# Patient Record
Sex: Male | Born: 1935 | Race: White | Hispanic: No | Marital: Married | Smoking: Former smoker
Health system: Southern US, Community
[De-identification: ages and names within clinical notes are randomized; demographics above are authoritative.]

## PROBLEM LIST (undated history)

## (undated) DIAGNOSIS — I714 Abdominal aortic aneurysm, without rupture, unspecified: Secondary | ICD-10-CM

## (undated) DIAGNOSIS — E669 Obesity, unspecified: Secondary | ICD-10-CM

## (undated) DIAGNOSIS — J189 Pneumonia, unspecified organism: Secondary | ICD-10-CM

## (undated) DIAGNOSIS — F419 Anxiety disorder, unspecified: Secondary | ICD-10-CM

## (undated) DIAGNOSIS — K5731 Diverticulosis of large intestine without perforation or abscess with bleeding: Principal | ICD-10-CM

## (undated) DIAGNOSIS — E785 Hyperlipidemia, unspecified: Secondary | ICD-10-CM

## (undated) DIAGNOSIS — I10 Essential (primary) hypertension: Secondary | ICD-10-CM

## (undated) DIAGNOSIS — I252 Old myocardial infarction: Secondary | ICD-10-CM

## (undated) DIAGNOSIS — R0902 Hypoxemia: Secondary | ICD-10-CM

## (undated) DIAGNOSIS — I480 Paroxysmal atrial fibrillation: Secondary | ICD-10-CM

## (undated) DIAGNOSIS — Z8701 Personal history of pneumonia (recurrent): Secondary | ICD-10-CM

## (undated) DIAGNOSIS — I2510 Atherosclerotic heart disease of native coronary artery without angina pectoris: Secondary | ICD-10-CM

## (undated) DIAGNOSIS — J302 Other seasonal allergic rhinitis: Secondary | ICD-10-CM

## (undated) DIAGNOSIS — IMO0001 Reserved for inherently not codable concepts without codable children: Secondary | ICD-10-CM

## (undated) DIAGNOSIS — J439 Emphysema, unspecified: Secondary | ICD-10-CM

## (undated) DIAGNOSIS — Z951 Presence of aortocoronary bypass graft: Secondary | ICD-10-CM

## (undated) HISTORY — DX: Personal history of pneumonia (recurrent): Z87.01

## (undated) HISTORY — DX: Hypoxemia: R09.02

## (undated) HISTORY — PX: OTHER SURGICAL HISTORY: SHX169

## (undated) HISTORY — DX: Emphysema, unspecified: J43.9

## (undated) HISTORY — DX: Presence of aortocoronary bypass graft: Z95.1

## (undated) HISTORY — DX: Old myocardial infarction: I25.2

## (undated) HISTORY — PX: CORONARY ARTERY BYPASS GRAFT: SHX141

## (undated) HISTORY — DX: Obesity, unspecified: E66.9

## (undated) HISTORY — DX: Atherosclerotic heart disease of native coronary artery without angina pectoris: I25.10

## (undated) HISTORY — DX: Hyperlipidemia, unspecified: E78.5

## (undated) HISTORY — DX: Pneumonia, unspecified organism: J18.9

## (undated) HISTORY — DX: Other seasonal allergic rhinitis: J30.2

## (undated) HISTORY — PX: CATARACT EXTRACTION: SUR2

---

## 2000-02-01 ENCOUNTER — Ambulatory Visit (HOSPITAL_COMMUNITY): Admission: RE | Admit: 2000-02-01 | Discharge: 2000-02-01 | Payer: Self-pay | Admitting: *Deleted

## 2000-02-07 ENCOUNTER — Observation Stay (HOSPITAL_COMMUNITY): Admission: EM | Admit: 2000-02-07 | Discharge: 2000-02-08 | Payer: Self-pay | Admitting: *Deleted

## 2000-02-07 ENCOUNTER — Encounter (INDEPENDENT_AMBULATORY_CARE_PROVIDER_SITE_OTHER): Payer: Self-pay

## 2001-04-30 ENCOUNTER — Ambulatory Visit (HOSPITAL_COMMUNITY): Admission: RE | Admit: 2001-04-30 | Discharge: 2001-04-30 | Payer: Self-pay | Admitting: Family Medicine

## 2004-11-30 ENCOUNTER — Ambulatory Visit (HOSPITAL_COMMUNITY): Admission: RE | Admit: 2004-11-30 | Discharge: 2004-12-01 | Payer: Self-pay | Admitting: Ophthalmology

## 2005-07-05 ENCOUNTER — Ambulatory Visit: Admission: RE | Admit: 2005-07-05 | Discharge: 2005-07-05 | Payer: Self-pay | Admitting: Internal Medicine

## 2005-07-12 ENCOUNTER — Ambulatory Visit: Payer: Self-pay | Admitting: Cardiology

## 2005-07-12 ENCOUNTER — Inpatient Hospital Stay (HOSPITAL_COMMUNITY): Admission: EM | Admit: 2005-07-12 | Discharge: 2005-07-19 | Payer: Self-pay | Admitting: *Deleted

## 2005-07-13 ENCOUNTER — Encounter: Payer: Self-pay | Admitting: Cardiology

## 2005-07-21 DIAGNOSIS — Z951 Presence of aortocoronary bypass graft: Secondary | ICD-10-CM

## 2005-07-21 DIAGNOSIS — I252 Old myocardial infarction: Secondary | ICD-10-CM

## 2005-07-21 DIAGNOSIS — I2510 Atherosclerotic heart disease of native coronary artery without angina pectoris: Secondary | ICD-10-CM

## 2005-07-21 HISTORY — DX: Presence of aortocoronary bypass graft: Z95.1

## 2005-07-21 HISTORY — DX: Atherosclerotic heart disease of native coronary artery without angina pectoris: I25.10

## 2005-07-21 HISTORY — DX: Old myocardial infarction: I25.2

## 2005-08-17 ENCOUNTER — Inpatient Hospital Stay (HOSPITAL_COMMUNITY): Admission: AD | Admit: 2005-08-17 | Discharge: 2005-09-08 | Payer: Self-pay | Admitting: Cardiology

## 2005-08-17 HISTORY — PX: CARDIAC CATHETERIZATION: SHX172

## 2005-08-18 ENCOUNTER — Ambulatory Visit: Payer: Self-pay | Admitting: Critical Care Medicine

## 2005-08-19 ENCOUNTER — Encounter: Payer: Self-pay | Admitting: Vascular Surgery

## 2005-08-19 ENCOUNTER — Encounter (INDEPENDENT_AMBULATORY_CARE_PROVIDER_SITE_OTHER): Payer: Self-pay | Admitting: Cardiology

## 2005-08-23 ENCOUNTER — Encounter: Payer: Self-pay | Admitting: Vascular Surgery

## 2005-09-30 ENCOUNTER — Encounter: Admission: RE | Admit: 2005-09-30 | Discharge: 2005-09-30 | Payer: Self-pay | Admitting: Cardiothoracic Surgery

## 2005-10-20 ENCOUNTER — Encounter (HOSPITAL_COMMUNITY): Admission: RE | Admit: 2005-10-20 | Discharge: 2006-01-18 | Payer: Self-pay | Admitting: Cardiology

## 2005-11-11 ENCOUNTER — Encounter: Admission: RE | Admit: 2005-11-11 | Discharge: 2005-11-11 | Payer: Self-pay | Admitting: Cardiothoracic Surgery

## 2006-01-19 ENCOUNTER — Encounter (HOSPITAL_COMMUNITY): Admission: RE | Admit: 2006-01-19 | Discharge: 2006-02-17 | Payer: Self-pay | Admitting: Cardiology

## 2006-03-15 ENCOUNTER — Ambulatory Visit: Payer: Self-pay | Admitting: Internal Medicine

## 2006-04-20 ENCOUNTER — Ambulatory Visit: Payer: Self-pay | Admitting: Internal Medicine

## 2006-05-11 ENCOUNTER — Ambulatory Visit: Payer: Self-pay | Admitting: Internal Medicine

## 2006-06-29 ENCOUNTER — Ambulatory Visit: Payer: Self-pay | Admitting: Internal Medicine

## 2006-09-27 ENCOUNTER — Ambulatory Visit: Payer: Self-pay | Admitting: Internal Medicine

## 2007-01-25 ENCOUNTER — Ambulatory Visit: Payer: Self-pay | Admitting: Internal Medicine

## 2007-05-23 DIAGNOSIS — J439 Emphysema, unspecified: Secondary | ICD-10-CM

## 2007-05-23 DIAGNOSIS — I2510 Atherosclerotic heart disease of native coronary artery without angina pectoris: Secondary | ICD-10-CM | POA: Insufficient documentation

## 2007-05-23 HISTORY — DX: Emphysema, unspecified: J43.9

## 2007-05-25 ENCOUNTER — Ambulatory Visit: Payer: Self-pay | Admitting: Internal Medicine

## 2007-08-26 ENCOUNTER — Emergency Department (HOSPITAL_COMMUNITY): Admission: EM | Admit: 2007-08-26 | Discharge: 2007-08-26 | Payer: Self-pay | Admitting: Emergency Medicine

## 2007-09-05 ENCOUNTER — Inpatient Hospital Stay (HOSPITAL_COMMUNITY): Admission: EM | Admit: 2007-09-05 | Discharge: 2007-09-07 | Payer: Self-pay | Admitting: Emergency Medicine

## 2007-11-21 ENCOUNTER — Ambulatory Visit: Payer: Self-pay | Admitting: Internal Medicine

## 2007-12-31 ENCOUNTER — Encounter: Payer: Self-pay | Admitting: Internal Medicine

## 2008-03-18 ENCOUNTER — Telehealth: Payer: Self-pay | Admitting: Internal Medicine

## 2008-05-06 ENCOUNTER — Telehealth (INDEPENDENT_AMBULATORY_CARE_PROVIDER_SITE_OTHER): Payer: Self-pay | Admitting: *Deleted

## 2008-05-26 ENCOUNTER — Ambulatory Visit: Payer: Self-pay | Admitting: Internal Medicine

## 2008-08-19 ENCOUNTER — Encounter: Payer: Self-pay | Admitting: Internal Medicine

## 2008-12-03 ENCOUNTER — Ambulatory Visit: Payer: Self-pay | Admitting: Internal Medicine

## 2009-01-05 ENCOUNTER — Telehealth (INDEPENDENT_AMBULATORY_CARE_PROVIDER_SITE_OTHER): Payer: Self-pay | Admitting: *Deleted

## 2009-03-31 ENCOUNTER — Encounter: Payer: Self-pay | Admitting: Internal Medicine

## 2009-03-31 ENCOUNTER — Telehealth: Payer: Self-pay | Admitting: Internal Medicine

## 2009-04-22 ENCOUNTER — Encounter: Payer: Self-pay | Admitting: Internal Medicine

## 2009-07-13 ENCOUNTER — Ambulatory Visit: Payer: Self-pay | Admitting: Internal Medicine

## 2009-08-06 ENCOUNTER — Encounter: Payer: Self-pay | Admitting: Internal Medicine

## 2009-11-18 ENCOUNTER — Telehealth (INDEPENDENT_AMBULATORY_CARE_PROVIDER_SITE_OTHER): Payer: Self-pay | Admitting: *Deleted

## 2009-11-27 ENCOUNTER — Encounter: Payer: Self-pay | Admitting: Internal Medicine

## 2010-01-11 ENCOUNTER — Ambulatory Visit: Payer: Self-pay | Admitting: Internal Medicine

## 2010-04-19 ENCOUNTER — Telehealth (INDEPENDENT_AMBULATORY_CARE_PROVIDER_SITE_OTHER): Payer: Self-pay | Admitting: *Deleted

## 2010-05-19 ENCOUNTER — Telehealth (INDEPENDENT_AMBULATORY_CARE_PROVIDER_SITE_OTHER): Payer: Self-pay | Admitting: *Deleted

## 2010-06-22 NOTE — Progress Notes (Signed)
Summary: samples  Phone Note Call from Patient Call back at Home Phone [phone]   Caller: Patient Call For: young Summary of Call: Request samples of spiriva. Initial call taken by: Darletta Moll,  April 19, 2010 4:02 PM  Follow-up for Phone Call        Spokw with pt , does not qualify for assisted drug program, left samples of spiriva for him, he will pick up 04/20/10 Renold Genta RCP, LPN  April 19, 2010 4:12 PM  Follow-up by: pt

## 2010-06-22 NOTE — Progress Notes (Signed)
Summary: spiriva  Phone Note Call from Patient Call back at Home Phone [phone]   Caller: Patient Call For: young Reason for Call: Talk to Nurse Summary of Call: want to ask about getting assistance for his Spiriva. Initial call taken by: Eugene Gavia,  November 18, 2009 3:21 PM  Follow-up for Phone Call        spoke with pt and he is requesting assistance forms be filled out again for the spiriva.  this has been filled out and mailed to pt to sign--he will mail these back and we will have cy sign these and get them faxed in.  pt is aware of process. Randell Loop CMA  November 18, 2009 3:35 PM

## 2010-06-22 NOTE — Letter (Signed)
Summary: [facility]  [facility]   Imported By: Sherian Rein 08/19/2009 09:56:15  _____________________________________________________________________  External Attachment:    Type:   Image     Comment:   External Document

## 2010-06-22 NOTE — Medication Information (Signed)
Summary: Assist form for [facility]   Imported By: Sherian Rein 01/04/2010 08:19:35  _____________________________________________________________________  External Attachment:    Type:   Image     Comment:   External Document

## 2010-06-22 NOTE — Assessment & Plan Note (Signed)
Summary: Roberto Reilly 37mos/ea   Primary Provider/Referring Provider:  Pang/ Al Little  CC:  6 month follow up visit-COPD; Gets SOB when walking in heat-otherwise no complaints..  History of Present Illness: 05/26/08- COPD. Finances. Asks H1N1 vax, had seasonal  vax. Sometimes a little cough at bedtime. Denies chest pain, palpitatilon. stable DOE, has to pace himself.Marland Kitchen Spiriva helps.Breathing does not wake him at night. Uses home neb routinely three times a day. Rarely needs rescue inhaler.  12/03/08- COPD, CAD Very hot weather worsens dyspnea. Some chest congestion , cough  occasionally productive of white, not bloody but sometimes light green. Discussed options due to cost of Spiriva. He decided to try taking spiriva every other day instead of trying theophylline at this time.  July 13, 2009- COPD, CAD Easy DOE with minor exertion. He says it is not realistic for him to try to work. Needs help with xopenex cost. Says he was rejected by the pharmacy plan for Spiriva.  Cough productive yellow to clear. One bad cold. Did get flu vax. Feels pretty well now sitting at rest.  January 11, 2010- COPD, CAD  Hot months were difficult- starting to do better now. Notes stable morning congestion with sputm that is green then clears to white and ends as he gets himself cleared out by noon. This pattern hasn't changed. Couldn't qualify for Spiriva assistance, but feels he needs it daily I suggested he only use it in stretches when really needed.. Uses Duoneb nebulizer up to 3-4 times daily without muscarinic side effects and with no hx of glaucoma. Xopenex works well and is better tolerated than albuterol, used up to 3 x daily.  Asthma History    Initial Asthma Severity Rating:    Age range: 12+ years    Symptoms: daily    Nighttime Awakenings: 0-2/month    Interferes w/ normal activity: some limitations    SABA use (not for EIB): daily    Asthma Severity Assessment: Moderate Persistent   Preventive  Screening-Counseling & Management  Alcohol-Tobacco     Smoking Status: quit     Year Started: 1944     Year Quit: 2001     Pack years: 55 years  2 packs daily  Current Medications (verified): 1)  Sotalol Hcl 80 Mg  Tabs (Sotalol Hcl) .... Take 1 Tablet By Mouth Two Times A Day 2)  Klor-Con 20 Meq  Pack (Potassium Chloride) .... Take 1 Tablet By Mouth Once A Day 3)  Furosemide 40 Mg  Tabs (Furosemide) .... Take 1 Tablet By Mouth Once A Day 4)  Simvastatin 20 Mg  Tabs (Simvastatin) .... Take 1 Tablet By Mouth Once A Day 5)  Warfarin .... As Directed 6)  Bayer Low Strength 81 Mg  Tbec (Aspirin) .... Take 1 Tablet By Mouth Once A Day 7)  Xopenex Hfa 45 Mcg/act  Aero (Levalbuterol Tartrate) .... 2 Puffs Four Times A Day As Needed 8)  Duoneb 2.5-0.5 Mg/45ml  Soln (Albuterol-Ipratropium) .... 4 Times A Day As Needed 9)  Spiriva Handihaler 18 Mcg  Caps (Tiotropium Bromide Monohydrate) .... Inhale 1 Capsule Daily  Allergies (verified): No Known Drug Allergies  Past History:  Past Medical History: Last updated: 05/25/2007 RLL resection for hamartoma CAD CABG MI COPD post op atrial fib hyperlipidemia Pneumonia Seasonal allergic rhinitis  Past Surgical History: Last updated: 05/26/2008 RUL for hamartoma CABG 4 v laser cataract surgery  Family History: Last updated: 12-10-2007 mother died at 24 from heart attack father died at 2 heart attack  Social History: Last updated: 11/28/2007 Patient states former smoker.  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  The patient denies shortness of breath at rest, non-productive cough, coughing up blood, chest pain, irregular heartbeats, acid heartburn, indigestion,  loss of appetite, weight change, abdominal pain, difficulty swallowing, sore throat, tooth/dental problems, headaches, and sneezing.    Vital Signs:  Patient profile:   77 year old male Height:      72 inches Weight:      254 pounds BMI:     34.57 O2 Sat:      93 % on Room air Pulse rate:   54 / minute BP sitting:   140 / 70  (left arm) Cuff size:   large  Vitals Entered By: Reynaldo Minium CMA (January 11, 2010 11:01 AM)  O2 Flow:  Room air CC: 6 month follow up visit-COPD; Gets SOB when walking in heat-otherwise no complaints.   Physical Exam  Additional Exam:  General: A/Ox3; pleasant and cooperative, NAD, obese with short neck SKIN: no rash, lesions NODES: no lymphadenopathy HEENT: [place]/AT, EOM- WNL, Conjuctivae- clear, PERRLA, TM-WNL, Nose- clear, Throat- clear and wnl NECK: Supple w/ fair ROM, JVD- none, normal carotid impulses w/o bruits Thyroid-, no stridor  CHEST: Mild inspiratory wheeze end inspiration left back- hx of lobectomy HEART: RRR, no m/g/r heard ABDOMEN: Soft and nl; nml bowel sounds; ZOX:WRUE, nl pulses, trace edema with less stasis chnage. NEURO: Grossly intact to observation      Impression & Recommendations:  Problem # 1:  COPD (ICD-496) Using a lot of bronchodilator  but he seems to be controlled. We compared med options. Weight loss would help.  Problem # 2:  ? of OBSTRUCTIVE SLEEP APNEA (ICD-327.23)  He looks like a high risk for sleep apnea, but he denies snore and daytime somnolence. He also refuses to be evaluated. I will give an information booklet.  Problem # 3:  CORONARY HEART DISEASE (ICD-V17.3) He denies chest pain or palpitations. Trace pretibial edema is present but not new, and stable or improved. He may have some increased right sided pressures, but not overt heart failure.  Other Orders: Est. Patient Level IV (45409)  Patient Instructions: 1)  Please schedule a follow-up appointment in 1 year. 2)  Pamplet on sleep apnea 3)  I  agree weight loss would help your breathing, so hang in there. 4)  Call for refills as needed 5)  Sample Spiriva

## 2010-06-22 NOTE — Assessment & Plan Note (Signed)
Summary: rov/jd   Primary Provider/Referring Provider:  Pang/ Al Little  CC:  follow up visit-SOB with activity-recovers once sitting.Marland Kitchen  History of Present Illness: 11/21/07- 77 year old man returning for follow-up of COPD.  Medications reviewed.  He is using Xopenex about twice a week.  Spiriva was no help.  He misunderstood and quit symbicort.  Dislikes the summer heat.  Easy exertional dyspnea.  He dieted and lost 20 pounds.  Denies sputum, chest pain, palpitation, adenopathy, GI or GU change, edema.  05/26/08- COPD. Finances. Asks H1N1 vax, had seasonal  vax. Sometimes a little cough at bedtime. Denies chest pain, palpitatilon. stable DOE, has to pace himself.Marland Kitchen Spiriva helps.Breathing does not wake him at night. Uses home neb routinely three times a day. Rarely needs rescue inhaler.  12/03/08- COPD, CAD Very hot weather worsens dyspnea. Some chest congestion , cough  occasionally productive of white, not bloody but sometimes light green. Discussed options due to cost of Spiriva. He decided to try taking spiriva every other day instead of trying theophylline at this time.  July 13, 2009- COPD, CAD Easy DOE with minor exertion. He says it is not realistic for him to try to work. Needs help with xopenex cost. Says he was rejected by the pharmacy plan for Spiriva.  Cough productive yellow to clear. One bad cold. Did get flu vax. Feels pretty well now sitting at rest.   Current Medications (verified): 1)  Sotalol Hcl 80 Mg  Tabs (Sotalol Hcl) .... Take 1 Tablet By Mouth Two Times A Day 2)  Klor-Con 20 Meq  Pack (Potassium Chloride) .... Take 1 Tablet By Mouth Once A Day 3)  Furosemide 40 Mg  Tabs (Furosemide) .... Take 1 Tablet By Mouth Once A Day 4)  Simvastatin 20 Mg  Tabs (Simvastatin) .... Take 1 Tablet By Mouth Once A Day 5)  Warfarin .... As Directed 6)  Bayer Low Strength 81 Mg  Tbec (Aspirin) .... Take 1 Tablet By Mouth Once A Day 7)  Xopenex Hfa 45 Mcg/act  Aero (Levalbuterol  Tartrate) .... 2 Puffs Four Times A Day As Needed 8)  Duoneb 2.5-0.5 Mg/22ml  Soln (Albuterol-Ipratropium) .... 4 Times A Day As Needed 9)  Spiriva Handihaler 18 Mcg  Caps (Tiotropium Bromide Monohydrate) .... Inhale 1 Capsule Daily  Allergies (verified): No Known Drug Allergies  Past History:  Past Medical History: Last updated: 05/25/2007 RLL resection for hamartoma CAD CABG MI COPD post op atrial fib hyperlipidemia Pneumonia Seasonal allergic rhinitis  Past Surgical History: Last updated: 05/26/2008 RUL for hamartoma CABG 4 v laser cataract surgery  Family History: Last updated: 18-Dec-2007 mother died at 54 from heart attack father died at 71 heart attack  Social History: Last updated: Dec 18, 2007 Patient states former smoker.  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  The patient denies anorexia, fever, weight loss, weight gain, vision loss, decreased hearing, chest pain, syncope, headaches, hemoptysis, abdominal pain, and severe indigestion/heartburn.    Vital Signs:  Patient profile:   77 year old male Height:      72 inches Weight:      269.38 pounds BMI:     36.67 O2 Sat:      96 % on Room air Pulse rate:   61 / minute BP sitting:   118 / 64  (left  arm) Cuff size:   large  Vitals Entered By: Reynaldo Minium CMA (July 13, 2009 11:10 AM)  O2 Flow:  Room air  Physical Exam  Additional Exam:  General: A/Ox3; pleasant and cooperative, NAD, obese SKIN: no rash, lesions NODES: no lymphadenopathy HEENT: [place]/AT, EOM- WNL, Conjuctivae- clear, PERRLA, TM-WNL, Nose- clear, Throat- clear and wnl NECK: Supple w/ fair ROM, JVD- none, normal carotid impulses w/o bruits Thyroid- CHEST: Mild inspiratory wheeze end inspiration left back- hx of lobectomy HEART: RRR, no m/g/r heard ABDOMEN: Soft and  nl; nml bowel sounds; ZOX:WRUE, nl pulses, very minimal edema  NEURO: Grossly intact to observation      Impression & Recommendations:  Problem # 1:  COPD (ICD-496) Severe COPD, clinically stable. We discussed meds and cor pulmonale. He tries to lose weight.Aerobic endurance exercise, even walking , will help.  Other Orders: Est. Patient Level II (45409)  Patient Instructions: 1)  Please schedule a follow-up appointment in 6 months. 2)  Script for Xopenex Prescriptions: XOPENEX HFA 45 MCG/ACT  AERO (LEVALBUTEROL TARTRATE) 2 puffs four times a day as needed  #1 x 11   Entered and Authorized by:   Waymon Budge MD   Signed by:   Waymon Budge MD on 07/13/2009   Method used:   Print then Give to Patient   RxID:   [id number]

## 2010-06-24 NOTE — Progress Notes (Signed)
Summary: samples  Phone Note Call from Patient Call back at Home Phone [phone]   Caller: Patient Call For: young Summary of Call: wants samples of sprivia Initial call taken by: Lacinda Axon,  May 19, 2010 9:47 AM  Follow-up for Phone Call        Spoke with pt's spouse and notified sample of spiriva left up front for pick up.  Follow-up by: Vernie Murders,  May 19, 2010 10:35 AM

## 2010-07-05 ENCOUNTER — Telehealth (INDEPENDENT_AMBULATORY_CARE_PROVIDER_SITE_OTHER): Payer: Self-pay | Admitting: *Deleted

## 2010-07-14 NOTE — Progress Notes (Signed)
Summary: samples  Phone Note Call from Patient Call back at Home Phone [phone]   Caller: Patient Call For: young Summary of Call: Wants samples of spiriva. Initial call taken by: Darletta Moll,  July 05, 2010 11:03 AM  Follow-up for Phone Call        1 sample of Spiriva left for pt to pick up. Pt was denied through pt assistance program and cannot always pay his copay monthly of $45. Pt aware sample left for pick up.  1spiriva sample---lot#[id number] A, exp: 05/2011

## 2010-08-09 ENCOUNTER — Telehealth (INDEPENDENT_AMBULATORY_CARE_PROVIDER_SITE_OTHER): Payer: Self-pay | Admitting: *Deleted

## 2010-08-19 NOTE — Progress Notes (Signed)
Summary: sample of Spiriva  Phone Note Call from Patient Call back at Home Phone [phone]   Caller: Patient Call For: young Reason for Call: Talk to Nurse Summary of Call: Requesting sample of spirvia. Initial call taken by: Lehman Prom,  August 09, 2010 4:23 PM  Follow-up for Phone Call        called and spoke with pt.  pt aware 1 sample left at the front desk for spiriva.  lot #  [id number]  Exp Jan 2013. Arman Filter LPN  August 09, 2010 5:24 PM

## 2010-08-24 ENCOUNTER — Telehealth: Payer: Self-pay | Admitting: Internal Medicine

## 2010-08-24 NOTE — Telephone Encounter (Signed)
Called, spoke with pt.  States he needs a "renewal on the pt assistance" for xopenex.  I advised pt that there is no pt assistance for xopenex.  He states he picks this up at [facility] [address] and doesn't pay anything.  I advised he is probably talking about a refill.  But he states he received a letter in the mail regarding the xopenex.  States it came with a card to use but he did not understand it.  He says there are no alternative medications listed on this letter.  He will call the # provided on it for additional clarification and will call our office back if anything further is needed.  I also call [facility] and spoke with Clydie Braun.  States pt does have a $0 copay and does have refills left on xopenex -- he is aware of this.

## 2010-09-02 ENCOUNTER — Telehealth: Payer: Self-pay | Admitting: Internal Medicine

## 2010-09-02 NOTE — Telephone Encounter (Signed)
2 samples of Spiriva left for pt to pick up. Pt aware.

## 2010-10-05 NOTE — H&P (Signed)
NAME:  Roberto Reilly, Roberto Reilly NO.:  [id number]   MEDICAL RECORD NO.:  [id number]          PATIENT TYPE:  INP   LOCATION:  0102                         FACILITY:  [facility]   PHYSICIAN:  Elliot Cousin, M.D.    DATE OF BIRTH:  May 12, 1934   DATE OF ADMISSION:  09/05/2007  DATE OF DISCHARGE:                              HISTORY & PHYSICAL   PRIMARY CARE PHYSICIAN:  Dr. Juline Patch.   PRIMARY PULMONOLOGIST:  Dr. Fannie Knee.   PRIMARY CARDIOLOGIST:  Dr. Julieanne Manson.   CHIEF COMPLAINT:  Shortness of breath and wheezing.   HISTORY OF PRESENT ILLNESS:  The patient is a 77 year old man with a  past medical history significant for COPD, coronary artery disease, and  atrial fibrillation/atrial flutter, who presents to the emergency  department with the chief complaint of shortness of breath and wheezing.  His symptoms started approximately a week and a half ago.  He was seen  in the emergency department at that time.  He was sent home after  receiving nebulizer treatments.  He followed up with his primary care  physician, Dr. Ricki Miller, who prescribed Avelox.  The patient took Avelox for  7 days.  However, he did not feel significantly better.  He does  acknowledge a little improvement; however, over the past 2-3 days, his  symptoms have worsened.  He has had worsening shortness of breath, chest  congestion, and wheezing.  His cough has been productive with yellow  sputum.  He denies associated pleurisy, fever, or chills.  He denies  chest pain.  He has been using his albuterol nebulizer at home every 3-4  hours with little relief.  He denies any associated headache, nausea,  vomiting, diarrhea, abdominal pain, or unusual swelling in his legs.   During the evaluation in the emergency department, the patient is noted  to be afebrile and hemodynamically stable.  A chest x-ray reveals  chronic elevation of the right hemidiaphragm with scarring at the right  lung base; small  superimposed right pleural effusion cannot be  confidently excluded; prior right wedge resection noted.  The patient's  white blood cell count is currently 10.2.  His initial cardiac enzymes  are negative.  The patient will be admitted for further evaluation and  management.   PAST MEDICAL HISTORY:  1. Coronary artery disease status post CABG in April 2007 by Dr. Donata Clay.  2. History of atrial fibrillation/atrial flutter postoperatively April      2007.  The patient is on chronic Coumadin therapy.  3. COPD with an FEV-1 of 1.33 liters or 47% of the predicted value per      pulmonologist,  Dr. Maple Hudson.  1. Possible obstructive sleep apnea.  The patient declines evaluation      with a sleep study.  2. History of class IV congestive heart failure prior to the CABG.  3. Asymptomatic 60-80% right carotid artery stenosis.  4. Hyperlipidemia.  5. Pneumonia in February 2007.  6. Right lower lobe lobectomy secondary to lung mass.  The patient      states that  the mass was noncancerous (2001 at [facility]).  7. History of the vitreous hemorrhage of the left eye status post      surgery in July 2006.  8. Obesity.   MEDICATIONS:  1. Coumadin 2.5 mg daily except 5 mg every Monday and every Friday.  2. Xopenex 2 puffs every 4 hours as needed.  3. Albuterol nebulizer every 8 hours.  4. Simvastatin 20 mg daily.  5. Furosemide 40 mg daily.  6. Potassium chloride 20 mEq every other day.  7. Sotalol 80 mg once daily.  8. Aspirin 81 mg daily.  9. Resolves 1 inhalation daily.  10.Mucinex as needed.  11.Advil as needed.   ALLERGIES:  No known drug allergies.   SOCIAL HISTORY:  The patient is married.  [place]  He has 8 children in all.  He is a retired Medical illustrator.  He  stopped smoking in 2001 after smoking for 55 years.  He denies alcohol  and illicit drug use.   FAMILY HISTORY:  His mother died of a heart attack at 42 years of  age.  His father died of heart attack at 7 years of age.   REVIEW OF SYSTEMS:  As above in history of present illness. Also, he  denies bright red blood per rectum and black tary stools.   PHYSICAL EXAMINATION:  Temperature 98.2, blood pressure 147/77, pulse  59, respiratory rate 18, oxygen saturation 93% on 2 liters of oxygen.  GENERAL:  The patient is a pleasant, 77 year old, obese, Caucasian man  who is currently sitting up in bed in no acute distress.  HEENT:  Head is normocephalic, nontraumatic.  Pupils equal, round, and  reactive to light.  Extraocular muscles are intact.  Conjunctivae are  clear.  Sclerae white.  Tympanic membranes are clear bilaterally.  Nasal  mucosa is mildly dry.  No sinus tenderness.  Oropharynx reveals mildly  dry mucous membranes.  No posterior exudates or erythema.  The patient  has a full set of dentures.  NECK:  Obese and supple.  No adenopathy, no thyromegaly, no bruit, no  JVD.  LUNGS:  Mild diffuse wheezes and crackles bilaterally.  Breathing is  mildly tachypneic.  The patient is not using accessory muscles.  HEART:  Distant S1-S2 with no appreciable murmurs, rubs, or gallops.  Well-healed chest wall scar, nontender.  ABDOMEN:  Obese, positive bowel sounds, soft, nontender, nondistended.  No hepatosplenomegaly, no masses palpated.  GENITOURINARY AND RECTAL:  Deferred.  EXTREMITIES:  Pedal pulses barely palpable.  Trace of ankle edema  bilaterally.  No pretibial edema.  NEUROLOGIC:  The patient is alert and oriented x3.  Cranial nerves II-  XII are intact.  Strength is 5/5 throughout.  Sensation is intact.   ADMISSION LABORATORIES:  EKG reveals sinus bradycardia with a heart rate  of 58 beats per minute, nonspecific ST and T-wave abnormalities,  prolonged QT interval (486 with a QTC of 477).  Chest x-ray results are  above.  WBC 10.2, hemoglobin 17.3, platelets 204.  PT 54, INR 5.7.  BNP  51.2.  Sodium 136, potassium 3.9, chloride 98, CO2 30,  glucose 109, BUN  16, creatinine 1.18, calcium 9.1, total protein 6.9, albumin 3.7, AST  19, ALT 18.  CK 30, CK-MB 1.5, troponin I 0.02.   ASSESSMENT:  1. Chronic obstructive pulmonary disease exacerbation/acute infectious      bronchitis.  The patient has failed outpatient therapy with Avelox.  Currently, he is not in acute respiratory distress.  However, given      his past medical history, he warrants inpatient treatment.  The      patient does have a history of severe COPD with an FEV-1 of 47% of      its predicted value.  He is not oxygen dependent; however, he uses      albuterol nebulizer on a regular basis.  His chest x-ray shows no      evidence of pneumonia although a small superimposed right pleural      effusion cannot be excluded.  He is currently afebrile, and his      white blood cell count is within normal limits.  2. Mild polycythemia.  The patient's hemoglobin is 17.3.  The      elevation may be secondary to mild underlying chronic hypoxemia      given his possible diagnosis of obstructive sleep apnea.  The      patient does not appear to be volume depleted at this time.  3. Coagulopathy secondary to Coumadin.  The patient's INR is 5.7.  He      has had no episodes of bright red blood per rectum, black tarry      stools, or bloody sputum.  Given his recent history of antibiotic      treatment as an outpatient, more than likely, the INR has been      affected.  According to the patient, two weeks ago, his INR was in      the 2.2 range.  4. Prolonged QT interval.  The patient's QT interval is 486.  He has      no cardiac complaints.  He is treated chronically with sotalol for      a history of paroxysmal atrial fibrillation/atrial flutter.  More      than likely, the prolonged QT interval is secondary to sotalol.  Of      note, he takes 80 mg daily which was decreased from 80 mg b.i.d.      The patient is also mildly bradycardic.  5. History of paroxysmal atrial  fibrillation/atrial flutter.  As      indicated above, the patient is treated with Coumadin and sotalol.   PLAN:  1. Will start the patient on treatment with Solu-Medrol intravenously      along with azithromycin and Rocephin.  Symptomatic treatment will      be started as well with albuterol and Atrovent nebulizers, oxygen      therapy, Mucinex, and Robitussin.  2. Will hold the Coumadin and then dose per pharmacy once the      patient's INR becomes therapeutic.  3. Will decrease the dose of sotalol to 40 mg daily and follow the QT      interval.  Will hold the sotalol if the patient's heart rate is      less than 60 beats per minute.  4. For further evaluation, will check a sputum culture and urine      Legionella antigen.  5. Will check a follow-up EKG in the morning.  We will also assess the      patient's magnesium level to rule out deficiency.  Will also assess      his TSH.      Elliot Cousin, M.D.  Electronically Signed     DF/MEDQ  D:  09/05/2007  T:  09/05/2007  Job:  [id number]   cc:   Juline Patch, M.D.  Fax: [phone]  Clinton D. Maple Hudson, MD, FCCP, FACP  [facility]-Pulmonary Dept  [address]  [place]  [place] [address]   Thereasa Solo. Little, M.D.  Fax: [phone]

## 2010-10-05 NOTE — Assessment & Plan Note (Signed)
[facility]                             PULMONARY OFFICE NOTE   Roberto Reilly, HEPPLER                        MRN:          [id number]  DATE:01/25/2007                            DOB:          December 13, 1933    PROBLEM LIST:  1. Chronic obstructive pulmonary disease.  2. Status post left lower lobe resection.  3. Coronary artery disease status post bypass graft.   HISTORY:  There is some rattle and chest congestion increasing over the  last two weeks.  He sleeps poorly, but does not want sleep study done.  Bedtime around 11 p.m. and wakes around 3 a.m.  He sits in a recliner  for a while and after about an hour will fall back to sleep.  Asks for a  rescue inhaler.  Does not have home oxygen.  Failed trial of Spiriva.   MEDICATIONS:  1. Sotalol 80 mg b.i.d.  2. Potassium 20 mEq.  3. Furosemide 40 mg.  4. Simvastatin 20 mg.  5. Warfarin.  6. Home nebulizer with DuoNeb usually used b.i.d.  7. Symbicort 160/4.5 used b.i.d.  8. Albuterol rescue inhaler used occasionally.   ALLERGIES:  No known drug allergies.   OBJECTIVE:  VITAL SIGNS:  Weight 273 pounds which is about stable, blood  pressure 130/78, pulse 59, room air saturation 94%.  GENERAL:  He is obese with a short neck.  There are large soft varices  in his right calf.  No palpable cords.  LUNGS:  Wet, congested rattle right greater than left lung.  Breathing  is unlabored.  HEART:  Sounds are regular without murmur.  NECK:  I cannot tell that neck veins are distended.  He is wearing  dentures.  Palate spacing is 2/4, pharynx is a little bit reddened.  Voice quality is normal.  No coughing or wheezing while he is here.   IMPRESSION:  1. Chronic obstructive pulmonary disease.  2. Probable obstructive sleep apnea.   He is not willing to have a sleep study done and I think his concern is  financial.  Past history of congestive heart failure.   PLAN:  1. Try Xopenex HFA two puffs q.i.d.  p.r.n.  2. He is given a nebulizer treatment of Xopenex 1.25 mg and Depo-      Medrol 80 mg IM with      steroid talk.  3. Schedule return in 4 months, earlier p.r.n.     Clinton D. Maple Hudson, MD, Tonny Bollman, FACP  Electronically Signed    CDY/MedQ  DD: 01/28/2007  DT: 01/28/2007  Job #: [id number]   cc:   Juline Patch, M.D.  Thereasa Solo. Little, M.D.

## 2010-10-05 NOTE — Discharge Summary (Signed)
NAME:  Roberto Reilly, Roberto Reilly NO.:  [id number]   MEDICAL RECORD NO.:  [id number]          PATIENT TYPE:  INP   LOCATION:  1402                         FACILITY:  [facility]   PHYSICIAN:  Ladell Pier, M.D.   DATE OF BIRTH:  Jun 27, 1933   DATE OF ADMISSION:  09/05/2007  DATE OF DISCHARGE:  09/07/2007                               DISCHARGE SUMMARY   DISCHARGE DIAGNOSES:  1. COPD (chronic obstructive pulmonary disease) exacerbation.  2. Coronary artery disease status post CABG (coronary artery bypass      graft) in April of 2007 by Dr. Kathlee Nations Trigt.  3. History of atrial fibrillation and flutter postoperatively 2007 on      chronic Coumadin therapy.  4. Sleep apnea.  5. History of class IV CHF (congestive heart failure) prior to CABG.  6. Carotid artery stenosis of 80% on the right.  7. Hyperlipidemia.  8. Pneumonia in February of 2007.  9. Right lower lobe lobectomy secondary to lung mass in 2001 at      [facility].  10.History of vitreous hemorrhage of the left eye status post surgery      in July of 2006.  11.Obesity.   DISCHARGE MEDICATIONS:  1. Coumadin 2.5 mg daily except 5 mg Mondays and Fridays.  2. Xopenex 2 puffs every 4 hours as needed.  3. Albuterol nebulizer every 8 hours.  4. Simvastatin 20 mg daily at bedtime.  5. Lasix 40 mg daily.  6. Potassium 20 mEq daily.  7. Sotalol 80 mg once daily.  8. Aspirin 81 mg daily.  9. Mucinex as needed.  10.Advil as needed.  11.Doxycycline 100 mg b.i.d. x7 days.  12.Prednisone 10 mg dose taper.   FOLLOWUP APPOINTMENT:  The patient to follow up with Dr. Ricki Miller in a week.   PROCEDURES:  None.   CONSULTANTS:  None.   HISTORY OF PRESENT ILLNESS:  The patient is a 77 year old white male  that presented to the ED complaining of shortness of breath.  He has a  history of atrial fibrillation/flutter, COPD, heart disease.  He had  been to the ER previously with complaints of chest pain and was given  nebulizer treatments.   He also went to see his primary care physician,  Dr. Ricki Miller, and was given Avelox for 7 days.  Please see the admission  note for remainder of history, past medical history, family history,  social history, meds, allergies as per admission H and P.   DISCHARGE PHYSICAL EXAMINATION:  VITAL SIGNS:  Temperature 98.3, pulse  72, respirations 18.  CARDIOVASCULAR:  Irregular irregularly.  LUNGS:  Minimal rhonchi throughout.  ABDOMEN:  Positive bowel sounds.  EXTREMITIES:  No edema.   HOSPITAL COURSE:  1. COPD exacerbation:  The patient was admitted to the hospital, was      treated with IV antibiotics and IV steroids.  Along with nebulizer      treatments.  His symptoms improved.  Today he feels fine with no      increased work of breathing.  He will be discharged home on      doxycycline for 7  days and steroid taper to follow up with his      primary care physician.  2. Atrial fibrillation/flutter:  The patient has been on chronic      Coumadin therapy.  His INR was supertherapeutic up to 6.3.  He was      given FFP and INR now is 2.5.  He will follow up with his primary      care physician for his INR checks.   DISCHARGE LABS:  Urine for Legionella negative.  PT 27.6, INR 2.5.  TSH  0.372, magnesium 2.4.  Cardiac panel negative.  Sodium 136, potassium  4.4, chloride 99, CO2 29, glucose 134, BUN 18, creatinine 1.28.  LFTs  within normal limits.  Chest x-ray showed chronic scarring and volume  loss in the right chest.  No acute findings.      Ladell Pier, M.D.  Electronically Signed     NJ/MEDQ  D:  09/07/2007  T:  09/07/2007  Job:  [id number]   cc:   Juline Patch, M.D.  Fax: [phone]   Young, Dr

## 2010-10-08 NOTE — Discharge Summary (Signed)
NAME:  Roberto Reilly, Roberto Reilly NO.:  [id number]   MEDICAL RECORD NO.:  [id number]          PATIENT TYPE:  INP   LOCATION:  2009                         FACILITY:  [facility]   PHYSICIAN:  Jonna L. Robb Matar, M.D.DATE OF BIRTH:  11/29/1933   DATE OF ADMISSION:  07/12/2005  DATE OF DISCHARGE:  07/19/2005                                 DISCHARGE SUMMARY   PCP - Dr. Juline Patch.  Cardiologist - Dr. Clarene Duke.   FINAL DIAGNOSES:  1.  Right lower lobe bacterial pneumonia.  2.  Chronic obstructive pulmonary disease.  3.  Multifocal atrial tachycardia.  4.  Morbid obesity.  5.  Probable sleep apnea.  6.  Congestive heart failure.  7.  Minimally elevated liver function tests likely due to statin.   CODE STATUS:  Full.   ALLERGIES:  None.   HISTORY:  This morbidly overweight, 77 year old, white male has had an upper  respiratory infection for a week with increasing cough, some hemoptysis,  weakness. There is a previous history of a right sided lung infection,  partial lobectomy and has remained persistently dyspneic since then.   Physical examination showed an irregular tachycardia, wheezing diffusely,  peripheral edema.   BNP 262. White count 17.8. Right lower lobe consolidation. FEV1/FVC of 38%,  improving to 44% with bronchodilators. Vital capacity 61%, total lung  capacity 85.   HOSPITAL COURSE:  The patient was placed on Avelox IV and continued on his  Caduet. He received a couple of doses of Lasix and was put on BiPAP.  Vancomycin was added on the 21st, and that was discontinued on the 26th.  Echocardiography done on the 21st had overall left atrial size at the upper  limits of normal. Right ventricle was slightly dilated and function slightly  decreased. LV function was good. He may have some diastolic dysfunction.   DISPOSITION:  The patient will be discharged on:  1.  Caduet 10/20 q.d.  2.  Aspirin 81 q.d.  3.  Combivent 1 puff t.i.d. to q.i.d.  4.  Cardizem ER  300 q.d.  5.  Multivitamin q.d.  6.  Vitamin C 500 q.d.  7.  Avelox 400 q.d. for a week.  8.  Maxzide 25, a half q.d.   He is scheduled for an outpatient sleep study on April 16. He should follow-  up with Dr. Clarene Duke in 3-4 weeks at [facility] and with  Dr. Ricki Miller in 3-4 weeks at [facility].   CRITICAL LABS:  BUN and creatinine were 21/1.6 to 15/1.0. TSH 0.4. BNP 512.  WBC 17.2 to 11.2. Albumin 2.3. Hepatitis panel was all negative.      Jonna L. Robb Matar, M.D.  Electronically Signed     JLB/MEDQ  D:  07/19/2005  T:  07/19/2005  Job:  [id number]   cc:   Juline Patch, M.D.  Fax: [phone]   Thereasa Solo. Little, M.D.  Fax: [phone]

## 2010-10-08 NOTE — Letter (Signed)
March 08, 2007    Carin Hock. Epes, M.D.  [facility]  [address]  [place], [place] [address]   RE:  TANK, DIFIORE  MRN:  [id number]  /  DOB:  08-24-1933   Dear Dr. Emmit Pomfret:   Mr. Manfredo is followed at this office for chronic obstructive pulmonary  disease and a history of a right lower lobe resection for a benign lung  tumor in the past.  He has contacted [place] requesting a letter of surgical  clearance for cataract surgery on October 27.  You should be aware he  has severe obstructive lung disease with an FEV1 of 1.33 liters or 47%  of predicted normal.  He has not been oxygen dependent and his resting  room air oxygen saturation was 94% at last visit here on January 25, 2007.  He is followed by Dr. Ricki Miller for primary care and Dr. Caprice Kluver for  a history of coronary disease with coronary bypass graft and congestive  heart failure.   At his last visit chest exam revealed bilateral rhonchi and rales.  I  have suspected he has obstructive sleep apnea but he has not scheduled a  sleep study.   Medications listed in our record include:  1. Sotalol 80 mg b.i.d.  2. Potassium 20 mEq daily.  3. Furosemide 40 mg.  4. Simvastatin 20 mg.  5. Warfarin which is managed, I believe, by Dr. Clarene Duke.  6. Home nebulizer with DuoNeb used b.i.d. p.r.n.  7. Symbicort inhaler 160/4.5, two puffs b.i.d.  8. Albuterol rescue inhaler 2 puffs q.i.d. p.r.n.   No medication allergy is listed.   He will probably tolerate cataract surgery without undue complication  but he is at substantially increased cardiopulmonary risk for effects of  anesthesia, sedation, and any problems with fluid overload or  hypotension.    Sincerely,      Clinton D. Maple Hudson, MD, Ton[facility] Bollman, FACP  Electronically Signed    CDY/MedQ  DD: 03/08/2007  DT: 03/08/2007  Job #: [id number]

## 2010-10-08 NOTE — Assessment & Plan Note (Signed)
[facility]                               PULMONARY OFFICE NOTE   ANTOINETTE, HASKETT                        MRN:          [id number]  DATE:03/15/2006                            DOB:          [birth year]    PROBLEM:  Pulmonary consultation at the kind request of Dr. Caprice Kluver for  complaint of shortness of breath.  Dr. Ricki Miller is his primary physician.   HISTORY:  This 77 year old former smoker has noted exertional dyspnea more  since he had bypass surgery in April.  He had had right lower lobectomy  around 2001 for benign nodule and admits to some exertional dyspnea ever  since then.  He went through cardiac rehabilitation and says he desaturates  some with exertion.  He had been told in 2001 at the time of lung surgery  that he had emphysema.  There is a persistent cough.  Albuterol has been  some help, but he cannot tell any advantage from using the home nebulizer  machine.   MEDICATIONS:  1. Sotalol 80 mg b.i.d.  2. Potassium 20 mEq.  3. Furosemide 40 mg.  4. Simvastatin 20 mg.  5. Ambien CR 6.25 mg.  6. Warfarin.  7. DuoNeb nebulizer used b.i.d.  8. Albuterol rescue inhaler.   ALLERGIES:  NO MEDICATION ALLERGY.   REVIEW OF SYSTEMS:  Weight has gone down 40 pounds since heart surgery.  Breathing does not wake him at night.  Little reflux.  He denies snoring but  admits wife says he does.  He coughs up scant clear phlegm, never bloody or  purulent.  He walks, using a cane to rest because of shortness of breath,  not because of any arthritis pain.   PAST MEDICAL HISTORY:  1. Pneumonia in spring of 2007 for which he was hospitalized at [facility] for      up to 3 weeks.  2. Stress test was abnormal in the past, leading to coronary artery bypass      grafting.  3. Right lower lobectomy.  4. Seasonal chest congestion blamed on pollen with seasonal allergic      rhinitis but no diagnosis of asthma.  5. Has had some irregular heart rhythm.  6. He  denies myocardial infarction.   SOCIAL HISTORY:  Quit smoking 5 years ago.  Quit drinking beer.  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   FAMILY HISTORY:  He denies anybody with lung disease.  Both parents died  with myocardial infarction.   OBJECTIVE:  VITAL SIGNS:  Weight 253 pounds.  Blood pressure 110/70, pulse  regular at 79, room air saturation 96%.  GENERAL:  This is an obese man with a loose rattling cough, a rather  bronchitic congested wheeze diffusely with no dullness.  Work of breathing  is not increased.  NECK:  There is no neck vein distension or peripheral edema.  EXTREMITIES:  No cyanosis, clubbing, or adenopathy.  HEART:  Heart sounds are regular without murmur.  HEENT:  His nasal airway is clear.   IMPRESSION:  1. Probably significant chronic obstructive  pulmonary disease with a      chronic bronchitis component.  2. Coronary disease.  I am not sure if cardiac limitation contributes to      his dyspnea.   PLAN:  1. We will get the chest x-rays from studies at [facility].  2. Scheduling pulmonary function tests.  3. Sample trial Spiriva once daily.  4. Walk for endurance.  5. Flu vaccine.  6. Schedule return in 1 month.   I appreciate the chance to meet him.     Clinton D. Maple Hudson, MD, [facility], FACP    CDY/MedQ  DD: 03/17/2006  DT: 03/20/2006  Job #: [id number]   cc:   Juline Patch, M.D.  Thereasa Solo. Little, M.D.

## 2010-10-08 NOTE — Assessment & Plan Note (Signed)
[facility]                             PULMONARY OFFICE NOTE   Roberto Reilly, Roberto Reilly                        MRN:          [id number]  DATE:09/27/2006                            DOB:          12-17-1933    PROBLEMS:  1. Chronic obstructive pulmonary disease.  2. Status post left lower lobe resection.  3. Coronary disease/bypass graft.   HISTORY:  He says Spiriva was no help. Tired after a 3 hour drive and we  discussed what this meant in terms of physical effort. He notices  changes in air quality. Pulmonary rehabilitation program was too  expensive. Symbicort does help.   MEDICATIONS:  1. Sotalol 80 mg b.i.d.  2. Potassium 20 mEq.  3. Furosemide 40 mg.  4. Simvastatin 20 mg.  5. Coumadin.  6. DuoNeb by nebulizer b.i.d. p.r.n.  7. Symbicort 160/4.5.  8. Albuterol rescue inhaler p.r.n.  9. Ambien CR 6.25 mg nightly p.r.n.   ALLERGIES:  No medication allergy.   OBJECTIVE:  Weight 274 pounds, blood pressure 142/84, pulse 77, oxygen  saturation when he first sat down was 92% on room air and after sitting  for five minutes was 96% on room air. He is significantly overweight and  he has gained weight steadily over the last year. Trace edema, shallow  inspiratory effort consistent with his body habitus with mild bibasilar  rattle, no cough or wheeze, pulse is regular with occasional extra beat,  no murmur.   IMPRESSION:  Chronic obstructive pulmonary disease, deconditioning, and  obesity with hypoventilation.   PLAN:  1. Emphasis on weight loss. If he cannot participate in an organized      program, he can at least walk on his own and this was discussed. It      is okay to try Symbicort 1 puff b.i.d. to keep cost      down.  2. Schedule return 4 months, earlier p.r.n.     Clinton D. Maple Hudson, MD, [facility], FACP  Electronically Signed    CDY/MedQ  DD: 09/30/2006  DT: 10/01/2006  Job #: [id number]   cc:   Juline Patch, M.D.  Thereasa Solo. Little,  M.D.

## 2010-10-08 NOTE — Assessment & Plan Note (Signed)
[facility]                             PULMONARY OFFICE NOTE   Roberto Reilly, Roberto Reilly                        MRN:          [id number]  DATE:06/29/2006                            DOB:          June 24, 1933    PROBLEM:  1. Chronic obstructive pulmonary disease.  2. Status post left lower lobe resection.  3. Coronary disease/bypass graft.   HISTORY:  Spiriva seemed to help him a lot at first, but he says he no  longer notices any benefit.  No sputum.  He can walk a block  comfortably, using his cane.  Where he lives, he can swim some in the  swimming pool.  He had previously done cardiac rehab and we talked about  the availability of pulmonary rehab.  I now have cardiology records from  [facility], describing severe coronary disease with  normal left ventricular function.  On echocardiogram in March of 2007,  did not see the left ventricle well, but saw no wall abnormality.  Ejection fraction was not numerically described.  He had had a heart  catheterization at that time, showing the diffuse cardiac disease.   There are some old hospital records included.  On an exercise stress  test in March of 2007, he had to stop after 53 seconds on Bruce protocol  because of dyspnea and electrical pauses with no ischemia noted.   MEDICATION:  1. Sotalol 80 mg b.i.d.  2. Potassium 20 mEq.  3. Furosemide 40 mg.  4. Simvastatin 20 mg.  5. Ambien CR 6.25 mg occasionally.  6. Coumadin.  7. Home nebulizer with DuoNeb.  8. Spiriva.  9. Albuterol rescue inhaler.   No medication allergy.   OBJECTIVE:  Weight 267 pounds, BP 152/86, pulse 74, room air saturation  96%.  He is mildly tachypneic at rest, but otherwise seems comfortable.  The chest is very quiet with distant breath sounds.  He is quite obese.  Heart sounds are regular, without murmur or gallop.  There is no  cyanosis or edema.   Note that he had pneumococcal vaccine in 2007 and flu  vaccine this fall.   PFT:  From May 11, 2006, there was severe obstructive airways  disease with insignificant response to bronchodilator and air trapping.  His FEV1 was 1.33 (47% of predicted).  Diffusion was 50% of predicted.   IMPRESSION:  Dyspnea, reflecting severe COPD and probably also his  coronary artery disease and obesity with deconditioning.   PLAN:  1. I favor weight-loss and have directed him to the pulmonary      rehabilitation program, where he likely would need to have exercise      oxygen.  2. He will try off and on Spiriva to determine if that is helpful and      will compare it with a trial of Symbicort 160/4.5.  3. Schedule return in two months, earlier p.r.n.     Clinton D. Maple Hudson, MD, [facility]oll[facility], FACP  Electronically Signed    CDY/MedQ  DD: 06/29/2006  DT: 06/30/2006  Job #: [id number]   cc:   Gaspar Garbe  Leafy Half, M.D.  Juline Patch, M.D.

## 2010-10-08 NOTE — Consult Note (Signed)
NAME:  PJ, ZEHNER NO.:  [id number]   MEDICAL RECORD NO.:  [id number]          PATIENT TYPE:  INP   LOCATION:  3703                         FACILITY:  [facility]   PHYSICIAN:  Shan Levans, M.D. LHCDATE OF BIRTH:  1933/07/21   DATE OF CONSULTATION:  08/18/2005  DATE OF DISCHARGE:                                   CONSULTATION   REFERRING PHYSICIAN:  Dr. Madaline Savage.   CHIEF COMPLAINT:  Evaluate pulmonary status preop, CABG.   HISTORY OF PRESENT ILLNESS:  [age]-year-old white male admitted for  cardiac catheterization on August 17, 2005 because of ongoing dyspnea and  symptoms compatible with cardiac ischemia, previous history of atrial  fibrillation and previous history of failed treadmill test with bradycardia  and pauses.  Cardiac catheterization shows multiple three-vessel disease and  now it is recommended he pursue bypass surgery.  He was admitted in February  with right lung pneumonia, previous history of COPD, asthmatic bronchitis  and right lower lobe resection in 2001 for hamartoma at [facility].  He is on a nebulizer at home on a b.i.d. basis only.  He is  referred now for preop CABG evaluation.   PAST MEDICAL HISTORY:  None.   ALLERGIES:  None.   OUTPATIENT MEDICATIONS:  1.  Vitamin C.  2.  Caduet.  3.  Aspirin.  4.  Albuterol p.r.n.  5.  Albuterol/Atrovent nebulization once to twice daily.   SOCIAL HISTORY:  Married, currently not smoking, stopped smoking 5 years ago  after his lung resection, works at [employer] in a sedentary basis.   FAMILY HISTORY:  Mother died in her 53s of an MI.  Father died in his 26s of  an MI.   REVIEW OF SYSTEMS:  The patient continues to cough regularly with thick  green mucus, still has dyspnea with exertion, no active chest discomfort at  this time.   CURRENT INPATIENT MEDICATIONS:  Heparin drip, docusate, Altace, Lopressor,  aspirin.   PHYSICAL EXAMINATION:  GENERAL:  This  is an obese white male in no distress.  VITAL SIGNS:  Temperature 99, blood pressure 147/85, pulse 61, saturation is  97% on room air.  CHEST:  Inspiratory and expiratory wheeze with poor air flow.  CARDIAC:  Regular rate and rhythm without S3, normal S1 and S2.  ABDOMEN:  Protuberant.  Bowel sounds active.  EXTREMITIES:  No edema or clubbing.  SKIN:  Clear.  NEUROLOGIC:  Exam is intact.  HEENT/NECK:  Exam shows no jugular venous distention and no lymphadenopathy.  Oropharynx clear.  Neck supple.   LABORATORY DATA:  Pulmonary functions from February 2007 revealed an FEV-1  of 0.97, FVC of 2.58, FEV-1 to FVC ratio of 38%, total lung capacity to 85%.  Residual volume shows 120% predicted, which is compatible with air trapping,  diffusion capacity low at 66% predicted.  On room air, PO2 is 62 today.   RADIOLOGIC FINDINGS:  Chest x-ray shows persistent infiltrate in right lung  and previous right lung resection, lower lobe.   IMPRESSION:  Previous right lower lobectomy for hamartoma in 2001,  now with  ongoing hypoxemia, bronchospasm, asthmatic bronchitic flare and chronic  obstructive lung disease exacerbation with persistent infiltrate in right  lung, rule out organized pneumonia.   RECOMMENDATIONS:  Cancel surgery planned for August 19, 2005.  Administer IV  Medrol and IV cefepime.  Check sputum culture.  Add Spiriva.  Increase  nebulizer treatments.  Administer Mucinex, incentive spirometry and flutter  valve.  Will follow daily with you and follow up CT scan of the chest,  hopefully can undergo bypass surgery within the next week.      Shan Levans, M.D. [facility]  Electronically Signed     PW/MEDQ  D:  08/18/2005  T:  08/20/2005  Job:  [id number]   cc:   Salvatore Decent. Dorris Fetch, M.D.  [address]  [place]  [place] [address]   Nanetta Batty, M.D.  Fax: [phone]   Madaline Savage, M.D.  Fax: [phone]   Juline Patch, M.D.  Fax: [phone]

## 2010-10-08 NOTE — Discharge Summary (Signed)
NAME:  Roberto Reilly, Roberto Reilly NO.:  [id number]   MEDICAL RECORD NO.:  [id number]          PATIENT TYPE:  INP   LOCATION:  2030                         FACILITY:  [facility]   PHYSICIAN:  Kerin Perna, M.D.  DATE OF BIRTH:  02-27-1934   DATE OF ADMISSION:  08/17/2005  DATE OF DISCHARGE:  09/07/2005                                 DISCHARGE SUMMARY   PRIMARY ADMITTING DIAGNOSIS:  Coronary artery disease.   ADDITIONAL/DISCHARGE DIAGNOSES:  1.  Severe three-vessel coronary artery disease with class IV congestive      heart failure and class IV angina.  2.  History of atrial arrhythmias with postoperative atrial fibrillation and      atrial flutter.  3.  Chronic obstructive pulmonary disease.  4.  History of congestive heart failure.  5.  History of recent pneumonia.  6.  History of vitreous hemorrhage.  7.  Possible obstructive sleep apnea.  8.  Status post right thoracotomy with lung resection for hamartoma.  9.  Asymptomatic 60-80% right internal carotid artery stenosis by duplex.  10. Postoperative ileus.  11. Hyperlipidemia.  12. Urinary tract infection.  13. Acute postoperative renal insufficiency, resolved.  14. Malnutrition.  15. Deconditioning.   PROCEDURES PERFORMED:  1.  Cardiac catheterization.  2.  Coronary artery bypass grafting x4 (left internal mammary artery to the      LAD, saphenous vein graft to the obtuse marginal, sequential saphenous      vein graft to the posterior descending and posterolateral branches of      the right coronary artery).  3.  Endoscopic vein harvest, left leg.   HISTORY:  The patient is a 77 year old white male with a history of atrial  arrhythmias.  He was hospitalized back in February for hemoptysis and  shortness of breath.  He was treated for pneumonia and was discharged home.Marland Kitchen  His subsequently followed up with Dr. Mindi Junker Reilly's office for an outpatient  stress test.  At that time, he walked approximately 50 seconds and  developed  severe wheezing and shortness of breath, as well as bradycardia with pauses.  He was seen by Dr. Elsie Reilly, and it was felt that he should undergo outpatient  cardiac catheterization to further delineate his coronary anatomy.  Outpatient cardiac catheterization was performed on the date of this  admission, and he was found to have a 75% left main stenosis with severe  three-vessel coronary artery disease and normal left ventricular function.  Based on these findings, he was admitted and started on IV heparin and  scheduled for a cardiothoracic surgery consultation.   HOSPITAL COURSE:  The patient was initially seen by Dr. Charlett Reilly  for a cardiothoracic surgery consultation.  Because of his history of severe  COPD, Dr. Dorris Reilly felt that a pulmonary consultation should be obtained  prior to proceeding with any type of surgical revascularization.  Mr.  Reilly was seen by Dr. Danise Reilly who felt that he should be treated with  steroids for an acute COPD flare, as well as antibiotics prior to proceeding  with surgery.  The patient remained stable in  the hospital and pain free  during this time and underwent a complete preoperative workup which included  carotid Doppler studies.  This showed a 60-80% right internal carotid artery  stenosis with no significant stenosis on the right, and moderately decreased  ankle-brachial index on the right at 0.68 with an ABI of 0.99 on the left.  His pulmonary status remained stable and actually improved.  At the time of  surgery, he was maintaining O2 sats of greater than 90% on room air.  His  chest x-ray showed significantly improved aeration.  He was seen once again  in consultation for surgery, at this time by Roberto Reilly who felt  that he was in better condition to proceed with surgery at this time.  He  explained the risks, benefits and alternatives of surgery to the patient,  and Roberto Reilly agreed to proceed.  He was  taken to the operating room on  August 24, 2005 and underwent CABG x4 by Dr. Donata Reilly as described in detail  above.  He tolerated the procedure well and was transferred to the SICU in  stable condition.  He was able to be extubated shortly after surgery.  He  was hemodynamically stable and doing well on postoperative day #1.  From a  pulmonary standpoint, he was continued on IV steroids for 48 hours  postoperative and subsequently was placed on a p.o. steroid taper, as well  as nebulizer treatments and pulmonary toilet measures.  His pulmonary status  has remained fairly stable throughout his admission.  His chest x-ray most  recently on September 05, 2005 showed left lower lobe atelectasis but was  significantly improved.  The patient continues to require 1-2 liters of  supplemental oxygen to maintain O2 sats of greater than 90% on room air, but  was otherwise doing well from a pulmonary standpoint.  Postoperatively, he  developed atrial fibrillation and atrial flutter.  This was initially  treated with IV amiodarone and beta blocker therapy.  When he failed to  convert, he was also treated with digoxin and was ultimately started on  anticoagulation with Coumadin.  He has had episodes of sinus rhythm, but  overall is maintaining rate-controlled atrial fibrillation.  He has been  followed by cardiology, and his rates have been stable.  His INR is  therapeutic at this point at 1.8.  His main postoperative complication has  been significant ileus which developed around postoperative day #5.  This  was proven by KUB, and an NG tube was placed yielding approximately 1200 cc  of yellow fluid.  His NG was left to suction.  He was started on TNA and was  made n.p.o. until his bowel function improved.  He was treated very  conservatively and slowly, over the course of approximately a week, his  bowel function has returned.  Follow-up abdominal x-rays have shown improvement of his ileus.  He did begin  to pass flatus and ultimately his  bowel function returned completely.  He was started slowly on sips of  liquids, and his diet has been advanced accordingly.  Presently, he is  tolerating a regular diet without problem.  He has had no further nausea or  vomiting and is having normal bowel movements.  His abdomen on physical exam  is no longer distended or tender, and he has active bowel sounds.  Because  of this prolonged postoperative course, he has been quite deconditioned and  has been working with cardiac rehabilitation phase one,  as well as physical  therapy on strengthening and reconditioning.  He also experienced a slight  increase in his creatinine during his postoperative course, which peaked at  1.8 and subsequently began to trend downward with hydration.  He is now back  to baseline with a BUN of 27 and a creatinine of 1.1.  He has been  significantly volume overloaded and has been gently diuresed, although he  still has lower extremity edema on physical exam.  He is slowly but steadily  improving and has been transferred now to the floor.  His surgical incision  sites were all healing well.  His pacing wires and chest tube sutures have  all been removed.  He has been afebrile, and all vital signs have been  stable.  He did develop a slight increase in his white blood cell count at  one point over the past 48 hours and was otherwise asymptomatic.  However, a  urinalysis and urine cultures were obtained which were positive for a  Klebsiella urinary tract infection.  He has now been started on Keflex and  has completed 24 hours' worth of antibiotics.  His most recent labs on September 05, 2005 showed hemoglobin of 9.2, hematocrit 26.8, white count 12.3,  platelets 303, sodium 137, potassium 4.2, BUN 27, creatinine 1.1 with an INR  of 1.8 on September 06, 2005.  Since he is progressing well, it is felt that he  should be observed over the next 24 hours.  We will repeat a CBC and a basic   metabolic panel on morning of September 07, 2005.  If this has remained stable  and he is otherwise doing well, it is anticipated that he will be able to be  discharged home at that time, provided no acute changes have occurred in the  interim.  He is still requiring supplemental oxygen, and it is felt that he  can be discharged home with home O2 and home health to follow.   DISCHARGE MEDICATIONS:  1.  Coumadin.  Home dose will be determined by PT and INR drawn on the day      of discharge.  2.  Enteric-coated aspirin 81 mg daily.  3.  Zocor 20 mg daily.  4.  Lopressor 25 mg b.i.d.  5.  Lasix 40 mg daily x1 month.  6.  K-Dur 20 mEq daily x1 month.  7.  Keflex 500 mg t.i.d. x 1 week.  8.  Spiriva 18 mcg daily.  9.  Lanoxin 0.125 mg daily.  10. Lisinopril 20 mg daily.  11. Albuterol nebulizer treatments b.i.d.  12. Ipratropium bromide nebulizer treatments b.i.d.  13. Ambien home dose p.r.n. for sleep.  14. Tylox one to two q.4 h p.r.n. for pain.  DISCHARGE INSTRUCTIONS:  He is asked to refrain from driving, heavy lifting  or strenuous activity.  He may continue ambulating daily and using his  incentive spirometer.  He may shower daily and clean his incisions with soap  and water.  He will continue his same preoperative diet.   DISCHARGE FOLLOWUP:  He will need to make an appointment see Dr. Clarene Duke in  the next 2 weeks.  He will then follow up in 3 weeks with Dr. Kathlee Nations  Reilly and will have a chest x-ray at [facility] 1 hour prior to  this appointment.  A home health nurse has been arranged for cardiac surgery  protocol and will draw PT and INR approximately 48 hours from the time of  discharge with the results be sent to [facility].  Home  O2 has also been arranged.  He will contact our office in the interim if he  experiences any problems or has questions.      Coral Ceo, P.A.      Kerin Perna, M.D.  Electronically Signed     GC/MEDQ  D:  09/06/2005  T:  09/07/2005  Job:  [id number]   cc:   Thereasa Solo. Reilly, M.D.  Fax: [phone]   Juline Patch, M.D.  Fax: [phone]   CVTS Office

## 2010-10-08 NOTE — Op Note (Signed)
NAME:  MONTREL, DONAHOE NO.:  [id number]   MEDICAL RECORD NO.:  [id number]          PATIENT TYPE:  INP   LOCATION:  2308                         FACILITY:  [facility]   PHYSICIAN:  Kerin Perna, M.D.  DATE OF BIRTH:  03-21-1934   DATE OF PROCEDURE:  08/24/2005  DATE OF DISCHARGE:                                 OPERATIVE REPORT   OPERATION:  Coronary artery bypass grafting x4 (left internal mammary artery  to left anterior descending, saphenous vein graft to obtuse marginal,  sequential saphenous vein graft to posterior descending and posterolateral  branch of right coronary artery).   PRE AND POSTOPERATIVE DIAGNOSIS:  Class IV congestive heart failure, class  IV angina, severe three-vessel coronary disease.   SURGEON:  Kerin Perna, MD   ASSISTANT:  Gershon Crane PA-C   ANESTHESIA:  General by Dr. Arta Bruce   INDICATIONS:  The patient is a 77 year old patient Dr. Chanda Busing, who  presented with chest pain and CHF and positive cardiac enzymes.  He was  found to have a COPD flare-up as well with probable right lower lobe  pneumonia.  He was treated with antibiotics and bronchodilators, heparin and  nitroglycerin.  Cardiac catheterization demonstrated severe three-vessel  coronary disease, left main stenosis and preserved LV systolic function.  He  is felt to be a potential candidate for surgical revascularization, however,  his pulmonary status precluded immediate surgery and he was treated with  steroids, antibiotics, and bronchodilators by the pulmonologist for 5-7  days.   I examined the patient in his hospital room on at least two occasions and  reviewed the results of his cardiac catheterization and his overall medical  status with the patient and his wife and family.  I reviewed the indications  and expected benefits of coronary artery bypass surgery as well as the  alternatives to surgical therapy.  I discussed with the patient the risks to  him  of semi-urgent coronary artery surgery especially the risks of pulmonary  complications including pneumonia, prolonged ventilation dependency,  tracheostomy, as well as other complications including bleeding, MI, stroke,  wound infection, and death.  He understood all these issues and demonstrated  his consent to proceed with surgery as discussed.   OPERATIVE FINDINGS:  The patient's body habitus made exposure of the heart  extremely difficult.  The coronaries were imbedded in a deep layer of  epicardial fat.  The saphenous vein was harvested endoscopically from the  left leg.  The right leg has varicosities and is nonusable saphenous vein  conduit.  The mammary artery was a small vessel but had good flow.  The  patient was given the aprotinin protocol for this operation to reduce the  risk of bleeding and postoperative blood transfusion requirements which  would be detrimental to his suboptimal pulmonary status and severe COPD.   PROCEDURE:  The patient was brought to operating room and placed supine on  the operating table where general anesthesia was induced under invasive  hemodynamic monitoring.  The chest, abdomen and legs were prepped with  Betadine and draped as a sterile  field.  A sternal incision was made as the  saphenous vein was harvested endoscopically from the left leg.  The left  internal mammary artery was harvested as a pedicle graft from its origin at  the subclavian vessels.  Heparin was administered and ACT was documented as  being therapeutic.  The sternal retractor was placed using the deep blades.  The pericardium was opened and suspended.  The ascending aorta was exposed  and pursestrings were placed in the ascending aorta and right atrium.  The  patient was cannulated and placed on bypass.  The coronaries were identified  for grafting and the cardioplegia catheters were placed for both antegrade  aortic and retrograde coronary sinus cardioplegia.  The patient was  cooled  to 30 degrees.  The mammary artery and vein grafts were prepared for the  distal anastomoses.  The aortic crossclamp was applied and 800 mL of cold  blood cardioplegia was delivered in split doses between the antegrade aortic  and retrograde coronary sinus catheters.  There is good cardioplegic arrest  with septal temperature dropping less than 14 degrees.   The distal coronary anastomoses were performed.  The first distal  anastomosis in the first graft consisted of a sequential vein graft to the  posterior descending continuing in the poster lateral branch of right  coronary.  The posterior descending was 1.5 mm vessel.  It had a proximal  90% stenosis.  The total main right coronary was heavily calcified and was  nongraftable.  A side-to-side anastomosis with the vein was sewn using  running 7-0 Prolene.  The second distal anastomosis was a continuation of  the sequential vein graft to posterolateral branch of the right coronary.  There is a used a small 1.3 mm vessel with proximal 90% stenosis.  An end-to-  side anastomosis with the vein was sewn using running 7-0 Prolene and there  was good flow through graft.  Cardioplegia was redosed.  The third distal  anastomosis was the OM branch of the circumflex.  This was intramyocardial  and had a proximal 95% stenosis.  A reverse saphenous vein sewn end-to-side  with running 7-0 Prolene and there was good flow through graft.  Cardioplegia was redosed.  The fourth distal anastomosis was placed to the  mid LAD.  Distally the LAD was too small to graft and had some distal  disease.  The left IMA pedicle was brought through an opening created in  left lateral pericardium and was brought down onto the LAD which was deeply  intramyocardial in a thick layer of fat.  An anastomosis using running 8-0  Prolene was performed and an end-to-side fashion.  There is good flow through the anastomosis after briefly releasing the pedicle bulldog on  the  mammary artery.  The bulldog was replaced and the pedicle was secured  epicardium.  Cardioplegia was redosed.   Two proximal vein anastomoses were then placed on the ascending aorta using  a 4.0-mm punch running 6-0 Prolene.  Air was vented from the coronaries and  left side of the heart using a dose of retrograde warm blood cardioplegia  and the usual de-airing maneuvers on bypass prior to tying down the final  proximal anastomosis.  After the final proximal anastomosis was tied, the  crossclamp was removed and the heart was reperfused.   The heart resumed a spontaneous rhythm.  The bypass grafts were aspirated of  air using a 27 gauge needle.  The bypass grafts were opened.  Each had  good  flow and hemostasis was documented at the proximal and distal anastomoses.  The patient was rewarmed and reperfused.  The cardioplegia catheters were  removed.  When the patient was rewarmed and reperfused, the lungs re-  expanded.  The ventilator was resumed.  Temporary pacing wires were applied.  The patient was then weaned off bypass on renal dose dopamine with stable  blood pressure and cardiac output.  Protamine was administered without  adverse reaction.  The leg incision was irrigated and closed in a standard  fashion.  The superior pericardial fat was closed over the aorta.  Two  mediastinal and left pleural chest tube were placed and brought out through  separate incisions.  The sternum was closed with interrupted steel wire.  The  pectoralis fascia was closed in running #1 Vicryl.  Subcutaneous and skin  layers were closed in running Vicryl and sterile dressings were applied.  Total bypass time was 135 minutes with crossclamp time of 90 minutes to  perform all distal and proximal anastomoses.      Kerin Perna, M.D.  Electronically Signed     PV/MEDQ  D:  08/24/2005  T:  08/25/2005  Job:  [id number]   cc:   Madaline Savage, M.D.  Fax: [phone]   CVTS office

## 2010-10-08 NOTE — H&P (Signed)
NAME:  Roberto Reilly, Roberto Reilly NO.:  [id number]   MEDICAL RECORD NO.:  [id number]          PATIENT TYPE:  INP   LOCATION:  2918                         FACILITY:  [facility]   PHYSICIAN:  Hettie Holstein, D.O.    DATE OF BIRTH:  [birth year]   DATE OF ADMISSION:  07/12/2005  DATE OF DISCHARGE:                                HISTORY & PHYSICAL   PRIMARY CARE PHYSICIAN:  Dr. Juline Patch.   CHIEF COMPLAINT:  Coughing up blood-tinged sputum and shortness of breath.   HISTORY OF PRESENT ILLNESS:  Roberto Reilly is a morbidly obese 77 year old  male, with a previous medical history significant for COPD and reactive  airway disease, who recently underwent pulmonary function testing on  July 05, 2005. He has been having trouble breathing for about a week  now. He developed an upper respiratory tract infection last Wednesday and  complained of cold-like symptoms that progressively worsened. He works as a  Electrical engineer at [employer] and he was feeling too poorly to come to work up  until this past Monday where he developed a cough and had some blood-tinged  sputum. He presented to Dr. Lynne Logan office and subsequently presented to  [facility] Emergency Department.   He does have a prior history of right-sided lung resection with benign  findings and a partial lobectomy and really has not 100% recovered since  that time with regards to his breathing. In any event, he presents febrile  with a right lower lobe mass-like opacity and is being admitted for  pneumonia.   PAST MEDICAL HISTORY:  Roberto Reilly is morbidly obese. He underwent right  partial lobectomy about five years ago with benign findings. 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 He has had no other surgeries. Denies diabetes.  Denies no heart disease and reports that the medications that he  takes at  home are Caduet. He does know the dose. He takes aspirin daily, a  multivitamin and Albuterol meter dose inhaler. He does utilize [employer] on [address].   ALLERGIES:  He reports no known drug allergies.   SOCIAL HISTORY:  He quit smoking about five years ago when he had a lung  resection. He denies alcohol. He works at [employer] in a predominantly sedentary  job. However, his wife is reachable at [phone] and he is currently full  code.   FAMILY HISTORY:  Mother died in her 89s with MI. Father died in his 80s with  a MI.   REVIEW OF SYSTEMS:  He had been doing rather poorly every since his lung  resection. He has been much worse recently. He has had some blood-tinged  sputum.   PHYSICAL EXAMINATION:  VITAL SIGNS: His temperature is 102 in the emergency  department. Heart rate 79, respirations 28. O2 saturation 94% on room air.  His heart was irregular.  GENERAL: The patient is awake and alert. He does not appear to be in  acute  distress. He does personally wheeze and speaking very short sentences before  having to rest for breath. He is morbidly obese.  LUNGS: Revealed diminished breath sounds bilaterally and some diffuse  wheezing.  CARDIOVASCULAR: He does have a normal S1 and S2, irregularly irregular.  ABDOMEN: Soft and obese. There is no palpable tenderness.  EXTREMITIES: Trace pedal edema.  NEUROLOGICAL: Reveals the patient to be grossly neurologically intact.   LABORATORY DATA:  Urinalysis is negative. Point of care markers were  unremarkable. He had a myoglobin that was elevated. Creatinine of 1.3.  Sodium of 136, potassium 3.4. BNP of 263. WBC 17.8. His hemoglobin was 14.6,  platelet count 204, MCV 86.   Chest x-ray: He had a new right lower lobe mass-like consolidated area and a  small left pleural effusion. Radiologist recommended follow-up CT to  resolution. EKG: Atrial fibrillation with a rate in the 70s to 80s. Review  of his records  that were recently done reveal a FEV1, FEC of 38% improving  to 44% with bronchodilator therapy. His DLTL was 66%. His vital capacity was  61% and total lung capacity was 85%.   ASSESSMENT:  1.  Right lower lobe pneumonia with a questionable mass-like consolidation.  2.  Chronic obstructive pulmonary disease with known history of bronchospasm      and probable component of bronchitis.  3.  Fever secondary to #1.  4.  Morbid obesity.  5.  Hypertension.  6.  Mildly elevated myoglobin on the point of care. We will repeat with a      CPK, troponin and CK-MB.  7.  New onset of atrial fibrillation.   PLAN:  We will initiate IV antibiotics. Cultures were obtained in the  emergency department. We will administer heparin drip and follow closely his  clinical status. Check a 2-D echocardiogram. Gently IV fluids will be  administered and we will probably order a follow-up CT within the next 24 to  48 hours to evaluate this lung finding.      Hettie Holstein, D.O.  Electronically Signed     ESS/MEDQ  D:  07/12/2005  T:  07/13/2005  Job:  [id number]   cc:   Juline Patch, M.D.  Fax: [phone]

## 2010-10-08 NOTE — Op Note (Signed)
NAME:  Roberto Reilly, Roberto Reilly               ACCOUNT NO.:  [id number]   MEDICAL RECORD NO.:  [id number]          PATIENT TYPE:  OIB   LOCATION:  2550                         FACILITY:  [facility]   PHYSICIAN:  Beulah Gandy. Ashley Royalty, M.D. DATE OF BIRTH:  1933-06-26   DATE OF PROCEDURE:  11/30/2004  DATE OF DISCHARGE:                                 OPERATIVE REPORT   ADMISSION DIAGNOSIS:  Vitreous hemorrhage and presumed branch retinal vein  occlusion.   POSTOPERATIVE DIAGNOSIS:  Vitreous hemorrhage posterior membranes and branch  retinal vein occlusion, left eye.   PROCEDURES:  Pars plana vitrectomy left eye, retinal photocoagulation left  eye, gas fluid exchange left eye, membrane peel left eye.   SURGEON:  Alan Mulder, M.D.   ASSISTANT:  Rosalie Doctor, MA.   ANESTHESIA:  General.   DETAILS:  Usual prep and drape, 25-gauge trocars and 10, 2 and 4 o'clock,  infusion line and 4 o'clock and Provisc placed on the corneal surface. The  BIOM viewing system moved into place. Pars plana vitrectomy was begun just  behind the crystalline lens with the 25-gauge vitrectomy.  Large clots of  hemorrhage were seen. This was carefully engaged with the vitreous cutter  and removed.  The vitrectomy was carried for 360 degrees down to the  vitreous base. All areas of blood were removed.  An area of  neovascularization of the disk, a large white ghost vessel was seen coming  superiorly from the disk and blood was surrounding this area. The extrusion  line was moved into place and blood was vacuumed from the macular surface  and the surface of the area of branch retinal vein occlusion.  Once this was  completed, the endolaser was positioned in the eye, 355 burns were placed  with the Diode laser with power of 1000 milliwatts, 1000 microns each and  0.15 seconds each. Washout procedure was again performed. The area of  previous laser at 12 o'clock was peeled free with the lighted pick and  membranes in that area  were removed. A 50% gas fluid exchange was carried  out.  The instruments were removed from the eye. The trocars were removed  from the eye and the wounds were self-sealing polymyxin and gentamicin were  irrigated into tenon space. Atropine solution was applied. Marcaine was  injected around the globe for postop pain, Decadron 10 mg was injected into  the lower subconjunctival space. TobraDex ophthalmic ointment, a patch and  shield were placed. Closing pressure was 15 with a Barraquer tonometer.   COMPLICATIONS:  None.   DURATION:  One hour.       JDM/MEDQ  D:  11/30/2004  T:  11/30/2004  Job:  [id number]

## 2010-10-08 NOTE — Letter (Signed)
March 18, 2008    [facility]  [address]  [place], [place] [address]   Fax # [phone]   RE:  Roberto, Reilly  MRN:  [id number]  /  DOB:  March 14, 1934   To Whom It May Concern:   We have a request for letter of medical clearance for Mr. Roberto Reilly  anticipating eye surgery on April 03, 2008.  He was last seen in this  office on November 21, 2007, in routine followup for COPD.  He is a former  smoker.  Pulmonary function is stable.  There is no recent x-ray or  pulmonary function test information to offer.  He is followed by Dr.  Julieanne Manson at [facility] for coronary artery  disease with a history of paroxysmal atrial fibrillation on Coumadin.  That is likely to be of more immediate significance with surgery.  I  have no reports of any recent pulmonary instability and we would  consider him clear for anticipated surgery from a pulmonary standpoint.  Please check with Dr. Clarene Duke about his Cardiology status.     Sincerely,      Roberto D. Maple Hudson, MD, Roberto Reilly, FACP  Electronically Signed    CDY/MedQ  DD: 03/18/2008  DT: 03/19/2008  Job #: [id number]   CC:    [facility]

## 2010-10-08 NOTE — Consult Note (Signed)
NAME:  Roberto Reilly, Roberto Reilly NO.:  [id number]   MEDICAL RECORD NO.:  [id number]          PATIENT TYPE:  INP   LOCATION:  3703                         FACILITY:  [facility]   PHYSICIAN:  Salvatore Decent. Dorris Fetch, M.D.DATE OF BIRTH:  03-24-1934   DATE OF CONSULTATION:  08/18/2005  DATE OF DISCHARGE:                                   CONSULTATION   REASON FOR CONSULTATION:  Left main disease.   HISTORY OF PRESENT ILLNESS:  Roberto Reilly is a 77 year old gentleman with a  history of atrial arrhythmias, tobacco abuse, COPD, resection of a right  lower lobe mass, and recent pneumonia.  He was hospitalized in February with  hemoptysis and shortness of breath.  He had pneumonia.  He also had some  atrial tachycardias.  He was treated for pneumonia and ultimately discharged  home.  He had a stress test done on August 16, 2005.  During that, he walked  approximately 50 seconds and developed severe wheezing and shortness of  breath and also brady with pauses.  He was seen by Dr. Elsie Lincoln and underwent  cardiac catheterization where he was found to have 75% left main stenosis  and three vessel coronary disease.  He had normal left ventricular function.  He was then admitted and started on intravenous heparin.  He has been free  of symptoms since admission.   PAST MEDICAL HISTORY:  1.  Atrial tachycardias.  2.  COPD.  3.  Congestive heart failure.  4.  Possible obstructive sleep apnea.  5.  Recent pneumonia.  6.  History of vitreous hemorrhage.  7.  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  Patient states this was benign      but does not know the name of the tumor.   MEDICATIONS ON ADMISSION:  1.  Aspirin 81 mg daily.  2.  One A Day vitamin.  3.  Vitamin C.  4.  Atenolol 50 mg daily.  5.  Caduet 10/20 one tablet daily.  6.  Ventolin inhaler q.i.d. p.r.n.  7.  Atrovent inhaler b.i.d.  8.  Albuterol nebulizers b.i.d.  9.  Ambien 5 mg p.o. nightly.   Since admission, he has been  started on:  1.  Aspirin 325 mg daily.  2.  Lopressor 12.5 mg b.i.d.  3.  Altace 2.5mg  daily.  4.  Heparin.  5.  Nitroglycerin p.r.n.   ALLERGIES:  NO KNOWN DRUG ALLERGIES.   FAMILY HISTORY:  Significant for heart and lung disease.   SOCIAL HISTORY:  He has 100-pack-year history of smoking and quit in 2002  after his lung surgery.  He has varicose veins in his right leg.  He does  wear glasses.   He did have hemoptysis recently.  He says he has been having some sweats in  the evenings, he attributed that to a sleeping pill he had been taking.  No  tendency to bleed or bruise.  No problems with liver or kidney function.  No  stroke or TIA symptoms.  He does get some swelling in his legs occasionally.  All other systems are negative.  PHYSICAL EXAMINATION:  GENERAL APPEARANCE:  Roberto Reilly is a 77 year old  white male who is morbidly obese.  He is about 5 feet 8 inches tall and  weighs 260 pounds.  HEENT:  He is wearing glasses, mildly plethoric.  NECK:  No bruits, palpable adenopathy or thyromegaly.  LUNGS:  Diminished breath sounds throughout.  Slightly tubular breath sounds  at the right base.  There is no true wheezing at the present time.  CARDIOVASCULAR:  Regular rate and rhythm, normal S1 and S2, no rubs or  murmurs.  ABDOMEN:  Obese, soft and nontender.  EXTREMITIES:  There are some venostasis changes on the right leg and  varicosities below the right knee.  He does have palpable dorsalis pedis  pulses bilaterally.  SKIN:  Warm and dry.   LABORATORY DATA:  A 2-D echo showed no significant valvular pathology.   His white count was 6.4, hematocrit 41, platelets 144.  PT 15.3, INR 1.2,  PTT 28.  Sodium 139, potassium 4.2, BUN 13, creatinine 0.9.   His EKG showed sinus rhythm, PACs.   There is not a chest x-ray since February.   IMPRESSION:  Roberto Reilly is a 77 year old gentleman who presents with a  longstanding history of exertional shortness of breath which has  worsened  significantly recently.  Complicating matters is that he was recently  treated for pneumonia about a month ago.  He also was having some atrial  arrhythmias at that time.  He had a stress test which he failed fairly early  on due to wheezing and shortness of breath, but also had bradycardia with  pauses.  He then underwent cardiac catheterization where he was found to  have 75% left main disease, three vessel disease with small targets with  good left ventricular function.  There is no question that this is  contributing to his exertional symptoms and the only question is whether his  pulmonary status is also contributing.  Given that he has a long segment 75%  left main stenosis, coronary artery bypass grafting is indicated for  survival benefit and relief of symptoms.  I have discussed in detail with  the patient and his wife, the nature and extent of the surgery including the  incisions to be used.  They understand that coronary artery bypass grafting  does have risks including, but not limited to death, stroke, myocardial  infarction, deep venous thrombosis, pulmonary embolus, bleeding, possible  need for transfusions, infections, as well as other organ system dysfunction  including respiratory, renal or GI complications.  They do understand that  he is at increased risk for perioperative morbidity and mortality given his  underlying comorbid conditions.  They understand that he is at very high  risk for pulmonary complications including pneumonia , respiratory failure,  ventilator dependence, etc.   Before proceeding with surgery, he needs a chest x-ray to evaluate the right  lower lobe process.  We are going to try to obtain his pathology results  from [facility] to ensure this was, in fact, benign disease.  We are  going to also review his pulmonary function tests from his last admission if we can manage to get his old chart to the floor.  I am also going to ask   pulmonology to see the patient preoperatively to get their assessment.   Assuming that we can clear him from a pulmonary standpoint, we will plan to  proceed with surgery first case.  Will plan to use endoscopic vein from  the  left leg due to his varicosities below the right knee.           ______________________________  Salvatore Decent Dorris Fetch, M.D.     SCH/MEDQ  D:  08/18/2005  T:  08/19/2005  Job:  [id number]   cc:   Thereasa Solo. Little, M.D.  Fax: [phone]   Juline Patch, M.D.  Fax: [phone]

## 2010-10-13 ENCOUNTER — Telehealth: Payer: Self-pay | Admitting: Internal Medicine

## 2010-10-13 NOTE — Telephone Encounter (Signed)
Ok per Ramey to leave pt sample of spiriva.  Called and spoke with pt.  Informed him 2 boxes of spiriva sample left at front desk.

## 2010-10-19 ENCOUNTER — Emergency Department (HOSPITAL_COMMUNITY): Payer: Medicare Other

## 2010-10-19 ENCOUNTER — Inpatient Hospital Stay (HOSPITAL_COMMUNITY)
Admission: EM | Admit: 2010-10-19 | Discharge: 2010-10-22 | DRG: 191 | Disposition: A | Discharge status: Home Health | Payer: Medicare Other | Attending: Internal Medicine | Admitting: Internal Medicine

## 2010-10-19 DIAGNOSIS — I4891 Unspecified atrial fibrillation: Secondary | ICD-10-CM | POA: Diagnosis present

## 2010-10-19 DIAGNOSIS — Z6841 Body Mass Index (BMI) 40.0 and over, adult: Secondary | ICD-10-CM

## 2010-10-19 DIAGNOSIS — I498 Other specified cardiac arrhythmias: Secondary | ICD-10-CM | POA: Diagnosis present

## 2010-10-19 DIAGNOSIS — E669 Obesity, unspecified: Secondary | ICD-10-CM | POA: Diagnosis present

## 2010-10-19 DIAGNOSIS — I10 Essential (primary) hypertension: Secondary | ICD-10-CM | POA: Diagnosis present

## 2010-10-19 DIAGNOSIS — J441 Chronic obstructive pulmonary disease with (acute) exacerbation: Principal | ICD-10-CM | POA: Diagnosis present

## 2010-10-19 DIAGNOSIS — Z7901 Long term (current) use of anticoagulants: Secondary | ICD-10-CM

## 2010-10-19 DIAGNOSIS — R9431 Abnormal electrocardiogram [ECG] [EKG]: Secondary | ICD-10-CM | POA: Diagnosis present

## 2010-10-19 DIAGNOSIS — I5032 Chronic diastolic (congestive) heart failure: Secondary | ICD-10-CM | POA: Diagnosis present

## 2010-10-19 DIAGNOSIS — Z951 Presence of aortocoronary bypass graft: Secondary | ICD-10-CM

## 2010-10-19 DIAGNOSIS — I509 Heart failure, unspecified: Secondary | ICD-10-CM | POA: Diagnosis present

## 2010-10-19 DIAGNOSIS — I2510 Atherosclerotic heart disease of native coronary artery without angina pectoris: Secondary | ICD-10-CM | POA: Diagnosis present

## 2010-10-19 DIAGNOSIS — J9610 Chronic respiratory failure, unspecified whether with hypoxia or hypercapnia: Secondary | ICD-10-CM | POA: Diagnosis present

## 2010-10-19 LAB — URINALYSIS, ROUTINE W REFLEX MICROSCOPIC
Bilirubin Urine: NEGATIVE
Glucose, UA: NEGATIVE mg/dL
Hgb urine dipstick: NEGATIVE
Ketones, ur: NEGATIVE mg/dL
Nitrite: NEGATIVE
Protein, ur: NEGATIVE mg/dL
Specific Gravity, Urine: 1.012 (ref 1.005–1.030)
Urobilinogen, UA: 0.2 mg/dL (ref 0.0–1.0)
pH: 5 (ref 5.0–8.0)

## 2010-10-19 LAB — BLOOD GAS, VENOUS
Acid-Base Excess: 5.9 mmol/L — ABNORMAL HIGH (ref 0.0–2.0)
Bicarbonate: 33.4 mEq/L — ABNORMAL HIGH (ref 20.0–24.0)
O2 Content: 4 L/min
O2 Saturation: 61.4 %
Patient temperature: 98.6
TCO2: 29.1 mmol/L (ref 0–100)
pCO2, Ven: 60.8 mmHg — ABNORMAL HIGH (ref 45.0–50.0)
pH, Ven: 7.359 — ABNORMAL HIGH (ref 7.250–7.300)
pO2, Ven: 35.4 mmHg (ref 30.0–45.0)

## 2010-10-19 LAB — CARDIAC PANEL(CRET KIN+CKTOT+MB+TROPI)
CK, MB: 2.4 ng/mL (ref 0.3–4.0)
Relative Index: INVALID (ref 0.0–2.5)
Total CK: 50 U/L (ref 7–232)
Troponin I: <0.3 ng/mL (ref <0.30)

## 2010-10-19 LAB — CBC
HCT: 49.8 % (ref 39.0–52.0)
Hemoglobin: 16.6 g/dL (ref 13.0–17.0)
MCH: 29.5 pg (ref 26.0–34.0)
MCHC: 33.3 g/dL (ref 30.0–36.0)
MCV: 88.5 fL (ref 78.0–100.0)
Platelets: 141 K/uL — ABNORMAL LOW (ref 150–400)
RBC: 5.63 MIL/uL (ref 4.22–5.81)
RDW: 13.9 % (ref 11.5–15.5)
WBC: 8.9 10*3/uL (ref 4.0–10.5)

## 2010-10-19 LAB — BASIC METABOLIC PANEL
BUN: 18 mg/dL (ref 6–23)
CO2: 32 mEq/L (ref 19–32)
Chloride: 96 mEq/L (ref 96–112)
GFR calc Af Amer: >60 mL/min (ref >60)
Glucose, Bld: 109 mg/dL — ABNORMAL HIGH (ref 70–99)
Potassium: 4.4 mEq/L (ref 3.5–5.1)

## 2010-10-19 LAB — DIFFERENTIAL
Basophils Absolute: 0 K/uL (ref 0.0–0.1)
Basophils Relative: 0 % (ref 0–1)
Eosinophils Absolute: 0.1 10*3/uL (ref 0.0–0.7)
Eosinophils Relative: 1 % (ref 0–5)
Lymphocytes Relative: 26 % (ref 12–46)
Lymphs Abs: 2.3 10*3/uL (ref 0.7–4.0)
Monocytes Absolute: 0.7 K/uL (ref 0.1–1.0)
Monocytes Relative: 8 % (ref 3–12)
Neutro Abs: 5.7 10*3/uL (ref 1.7–7.7)
Neutrophils Relative %: 64 % (ref 43–77)

## 2010-10-19 LAB — BASIC METABOLIC PANEL WITH GFR
Calcium: 9.3 mg/dL (ref 8.4–10.5)
Creatinine, Ser: 0.97 mg/dL (ref 0.4–1.5)
GFR calc non Af Amer: >60 mL/min (ref >60)
Sodium: 138 meq/L (ref 135–145)

## 2010-10-19 LAB — PROTIME-INR
INR: 2.04 — ABNORMAL HIGH (ref 0.00–1.49)
Prothrombin Time: 23.2 s — ABNORMAL HIGH (ref 11.6–15.2)

## 2010-10-19 LAB — PRO B NATRIURETIC PEPTIDE: Pro B Natriuretic peptide (BNP): 601.5 pg/mL — ABNORMAL HIGH (ref 0–125)

## 2010-10-19 LAB — CK TOTAL AND CKMB (NOT AT ARMC)
CK, MB: 2.7 ng/mL (ref 0.3–4.0)
Relative Index: INVALID (ref 0.0–2.5)
Total CK: 53 U/L (ref 7–232)

## 2010-10-19 LAB — TROPONIN I: Troponin I: <0.3 ng/mL (ref <0.30)

## 2010-10-20 LAB — CBC
HCT: 50.6 % (ref 39.0–52.0)
Hemoglobin: 16.4 g/dL (ref 13.0–17.0)
MCH: 28.8 pg (ref 26.0–34.0)
MCHC: 32.4 g/dL (ref 30.0–36.0)
MCV: 88.8 fL (ref 78.0–100.0)
Platelets: 161 K/uL (ref 150–400)
RBC: 5.7 MIL/uL (ref 4.22–5.81)
RDW: 13.7 % (ref 11.5–15.5)
WBC: 11.1 K/uL — ABNORMAL HIGH (ref 4.0–10.5)

## 2010-10-20 LAB — BASIC METABOLIC PANEL
CO2: 31 mEq/L (ref 19–32)
Calcium: 9.3 mg/dL (ref 8.4–10.5)
Creatinine, Ser: 1.16 mg/dL (ref 0.4–1.5)
GFR calc Af Amer: >60 mL/min (ref >60)
Glucose, Bld: 148 mg/dL — ABNORMAL HIGH (ref 70–99)

## 2010-10-20 LAB — CARDIAC PANEL(CRET KIN+CKTOT+MB+TROPI)
CK, MB: 2.2 ng/mL (ref 0.3–4.0)
Relative Index: INVALID (ref 0.0–2.5)
Total CK: 47 U/L (ref 7–232)
Troponin I: <0.3 ng/mL (ref <0.30)

## 2010-10-20 LAB — BASIC METABOLIC PANEL WITH GFR
BUN: 23 mg/dL (ref 6–23)
Chloride: 94 meq/L — ABNORMAL LOW (ref 96–112)
GFR calc non Af Amer: >60 mL/min (ref >60)
Potassium: 5.3 meq/L — ABNORMAL HIGH (ref 3.5–5.1)
Sodium: 136 meq/L (ref 135–145)

## 2010-10-20 LAB — PROTIME-INR
INR: 2.11 — ABNORMAL HIGH (ref 0.00–1.49)
Prothrombin Time: 23.8 s — ABNORMAL HIGH (ref 11.6–15.2)

## 2010-10-21 LAB — BASIC METABOLIC PANEL
CO2: 37 mEq/L — ABNORMAL HIGH (ref 19–32)
Calcium: 9.2 mg/dL (ref 8.4–10.5)
Creatinine, Ser: 1.49 mg/dL (ref 0.4–1.5)
Glucose, Bld: 100 mg/dL — ABNORMAL HIGH (ref 70–99)

## 2010-10-21 LAB — CBC
MCH: 29.3 pg (ref 26.0–34.0)
MCV: 90.8 fL (ref 78.0–100.0)
Platelets: 152 10*3/uL (ref 150–400)
RDW: 14.1 % (ref 11.5–15.5)
WBC: 11 10*3/uL — ABNORMAL HIGH (ref 4.0–10.5)

## 2010-10-21 LAB — DIFFERENTIAL
Eosinophils Absolute: 0.1 10*3/uL (ref 0.0–0.7)
Eosinophils Relative: 1 % (ref 0–5)
Lymphs Abs: 3.2 10*3/uL (ref 0.7–4.0)
Monocytes Relative: 12 % (ref 3–12)
Neutrophils Relative %: 58 % (ref 43–77)

## 2010-10-21 LAB — PROTIME-INR: Prothrombin Time: 21.7 seconds — ABNORMAL HIGH (ref 11.6–15.2)

## 2010-10-21 NOTE — H&P (Signed)
NAME:  Roberto Reilly, Roberto Reilly NO.:  [id number]  MEDICAL RECORD NO.:  [id number]           PATIENT TYPE:  E  LOCATION:  WLED                         FACILITY:  [facility]  PHYSICIAN:  Mauro Kaufmann, MD         DATE OF BIRTH:  Oct 03, 1933  DATE OF ADMISSION:  10/19/2010 DATE OF DISCHARGE:                             HISTORY & PHYSICAL   PRIMARY CARE PROVIDER:  Juline Patch, M.D.  CHIEF COMPLAINT:  Shortness of breath.  HISTORY OF PRESENT ILLNESS:  A 77 year old male with a history of CAD, COPD, AFib who came to the hospital with worsening of shortness of breath for last 2 days.  The patient has a history of CAD with status post CABG in 2007.  The patient says that since that time, he has had constant shortness of breath.  He was also been seen by Pulmonary for the COPD.  The patient says that for last 2 days, he has been having worsening of shortness of breath especially on exertion.  He also has been having cough and also coughing up yellow-colored phlegm.  The patient previously has a history of pneumonia.  Denies any chest pain. Denies any abdominal pain.  No nausea, vomiting, or diarrhea.  No dysuria, urgency, frequency of urination.  No fever.  PAST MEDICAL HISTORY:  Significant for, 1. COPD. 2. CAD status post CABG. 3. History of AFib and flutter, currently on chronic Coumadin therapy. 4. Sleep apnea. 5. Carotid artery stenosis of 80% on the right. 6. Hyperlipidemia. 7. Right lower lobe lobectomy due to the mass in 2001 at [facility]. 8. History of a vitreous hemorrhage of the left eye status post     surgery in July 2006.  CURRENT MEDICATIONS:  The patient on fluids. 1. Albuterol 2 puffs daily. 2. Spiriva 18 mcg 1 capsules q.a.m. 3. Aspirin 81 mg p.o. daily. 4. Xopenex inhalation 2 puffs 3 times a day. 5. Warfarin 2.5 mg p.o. 2 tablets which the patient takes Monday and     Thursday 2 tablets and remaining days of the week take 1 tablet. 6.  Simvastatin 40 mg p.o. daily. 7. Furosemide 40 mg p.o. daily. 8. Potassium chloride 20 mEq p.o. daily. 9. Sotalol 80 mg p.o. b.i.d.  SOCIAL HISTORY:  The patient denies tobacco abuse.  No history of illicit drug abuse.  No history of alcohol abuse.  FAMILY HISTORY:  Noncontributory.  REVIEW OF SYSTEMS:  HEENT:  There is no headache or blurred vision.  No history of thyroid problems.  CHEST:  A history of COPD followed by Pulmonary, has been stable.  HEART:  As in HPI.  GENITOURINARY:  No dysuria, urgency, frequency of urination.  GI: As in HPI.  NEUROLOGICAL: No history of stroke or TIA in the past.  PHYSICAL EXAMINATION:  VITAL SIGNS:  The patient's blood pressure is 145/74, pulse 60, respirations 23, O2 sats 92% on 2 liters of oxygen. HEENT:  Head is atraumatic, normocephalic.  Eyes, extraocular movements are intact.  Oral mucosa is pink and moist. NECK:  Supple. CHEST:  No wheezing auscultated.  The patient  has faint crackles at both the lung bases. HEART:  S1 and S2, regular rate and rhythm. ABDOMEN:  Soft, nontender.  No organomegaly. EXTREMITIES:  Trace edema noted in the lower extremities.  No cyanosis and no clubbing. NEUROLOGICAL:  Cranial nerve II through XII grossly intact.  Motor strength is 5/5 in both upper and lower extremities.  Sensations are intact.  IMAGING STUDIES:  Chest x-ray shows no acute cardiopulmonary process, chronic atelectasis and effusion on the right hemithorax.  PERTINENT LABS:  WBC 8.9, hemoglobin 16.6, hematocrit 49.8, platelet count of 141, INR 2.04.  Sodium 138, potassium 4.4, chloride 96, CO2 of 32, BUN 18, creatinine 1.97, glucose is 109.  BNP is 601.5.  UA is negative.  ASSESSMENT: 1. Dyspnea multifactorial due to underlying COPD with question of     congestive heart failure. 2. Acute bronchitis. 3. Atrial fibrillation. 4. Hypertension.  PLAN: 1. Dyspnea.  The patient's dyspnea at this time seems to be     multifactorial as the  patient does have a history of CAD and he did     have CHF class IV symptoms prior to the CABG.  I am going to obtain     an echocardiogram and also increase dose of Lasix to 40 mg IV q.12     h.  We will monitor the patient's daily weights and also check the     I's and O's.  The patient does not have wheezing, so I am not going     to start him on steroids, but the patient will be given the Xopenex     inhalation as well as the Atrovent inhalation q.6 h. p.r.n. 2. Next acute bronchitis.  The patient is coughing up yellow colored     phlegm and has been coughing over the past two days.  I am going to     start the patient on Avelox 400 mg IV daily for the acute     bronchitis. 3. Atrial fibrillation.  The patient has been on sotalol and will be     continued on that. 4. CAD.  The patient has been on aspirin.  We will continue both     aspirin as well as sotalol. 5. Hypertension.  The patient has been on sotalol, he will be     continued on that. 6. DVT prophylaxis.  The patient is on Coumadin, which is managed by     the pharmacy.     Mauro Kaufmann, MD     GL/MEDQ  D:  10/19/2010  T:  10/19/2010  Job:  [id number]  Electronically Signed by Sibyl Parr LAMA  on 10/21/2010 07:09:05 AM

## 2010-10-21 NOTE — H&P (Signed)
  NAME:  Roberto Reilly, Roberto Reilly NO.:  [id number]  MEDICAL RECORD NO.:  [id number]           PATIENT TYPE:  E  LOCATION:  WLED                         [facility]AC[facility]  PHYSICIAN:  Mauro Kaufmann, MD         DATE OF BIRTH:  04-Oct-1933  DATE OF ADMISSION:  10/19/2010 DATE OF DISCHARGE:                             HISTORY & PHYSICAL   ADDENDUM: Please make a note that the patient's EKG shows borderline QT prolongation of 479 and also some ST-T depression and some changes.  I am going to obtain 3 sets of cardiac enzymes and the patient will be monitored on telemetry floor to rule out any underlying coronary ischemia.  Also, the patient has been on sotalol so he will need closer monitoring of the QT interval.  We will check an EKG again in the morning.     Mauro Kaufmann, MD     GL/MEDQ  D:  10/19/2010  T:  10/19/2010  Job:  [id number]  Electronically Signed by Sibyl Parr Brett Darko  on 10/21/2010 07:08:39 AM

## 2010-10-22 LAB — BASIC METABOLIC PANEL
BUN: 32 mg/dL — ABNORMAL HIGH (ref 6–23)
CO2: 36 mEq/L — ABNORMAL HIGH (ref 19–32)
Chloride: 96 mEq/L (ref 96–112)
Creatinine, Ser: 1.41 mg/dL (ref 0.4–1.5)

## 2010-11-22 ENCOUNTER — Telehealth: Payer: Self-pay | Admitting: Internal Medicine

## 2010-11-22 NOTE — Telephone Encounter (Signed)
Spoke with pt and notified samples left up front for pick up, and needs to keep his scheduled appt for 7.16.12 for additional rx. Pt verbalized understanding.

## 2010-12-03 ENCOUNTER — Encounter: Payer: Self-pay | Admitting: Internal Medicine

## 2010-12-06 ENCOUNTER — Encounter: Payer: Self-pay | Admitting: Internal Medicine

## 2010-12-06 ENCOUNTER — Ambulatory Visit (INDEPENDENT_AMBULATORY_CARE_PROVIDER_SITE_OTHER): Payer: Medicare Other | Admitting: Internal Medicine

## 2010-12-06 VITALS — BP 140/72 | HR 60 | Ht 72.0 in | Wt 254.6 lb

## 2010-12-06 DIAGNOSIS — I2510 Atherosclerotic heart disease of native coronary artery without angina pectoris: Secondary | ICD-10-CM

## 2010-12-06 DIAGNOSIS — J449 Chronic obstructive pulmonary disease, unspecified: Secondary | ICD-10-CM

## 2010-12-06 DIAGNOSIS — J4489 Other specified chronic obstructive pulmonary disease: Secondary | ICD-10-CM

## 2010-12-06 NOTE — Assessment & Plan Note (Signed)
Severe COPD with chronic bronchitis.  He was sent home with oxygen, used it only a week, felt it didn't help and he didn't need it. We discussed O2 and we will check home saturations.

## 2010-12-06 NOTE — Progress Notes (Signed)
Subjective:    Patient ID: Roberto Reilly, male    DOB: [birth year], 77 y.o.   MRN: [id number]  HPI 12/06/10- 87 yoM former smoker, followed for COPD, complicated by CAD, diastolic heart failure, PAFib/chronic anticoagulation, HBP, Obesity Last here January 11, 2010. - Note reviewed. He was hosp for COPD exacerbation, diastolic heart failure, chronic respiratory failure  5/29-10/22/10 , but says he is back to baseline now.  He blames hot humid weather currently for cough with chest rattle with clear to trace yellow sputum. He feels well controlled now. Denies smoking. Dr Ricki Miller added Symbicort ? Strength. It may be helping some. Using ventolin rescue inhaler 0-2x/day. Triggers- weather, indoors to outdoors. Continues Spiriva. He is wearing a heart monitor for Dr Clarene Duke, potential need for pacemaker.  Review of Systems Constitutional:   No-   weight loss, night sweats, fevers, chills, fatigue, lassitude. HEENT:   No-   headaches, difficulty swallowing, tooth/dental problems, sore throat,                  No-   sneezing, itching, ear ache, nasal congestion, post nasal drip,   CV:  No-   chest pain, orthopnea, PND, swelling in lower extremities, anasarca, dizziness, palpitations  GI:  No-   heartburn, indigestion, abdominal pain, nausea, vomiting, diarrhea,                 change in bowel habits, loss of appetite  Resp: , per HPI              No-  coughing up of blood.              No-   change in color of mucus.  No- wheezing.    Skin: No-   rash or lesions.  GU: No-   dysuria, change in color of urine, no urgency or frequency.  No- flank pain.  MS:  No-   joint pain or swelling.  No- decreased range of motion.  No- back pain.  Psych:  No- change in mood or affect. No depression or anxiety.  No memory loss.      Objective:   Physical Exam General- Alert, Oriented, Affect-appropriate, Distress- none acute   overweight Skin- rash-none, lesions- none, excoriation- none Lymphadenopathy-  none Head- atraumatic            Eyes- Gross vision intact, PERRLA, conjunctivae clear secretions            Ears- Hearing, canals            Nose- Clear, No-Septal dev, mucus, polyps, erosion, perforation             Throat- Mallampati II , mucosa clear , drainage- none, tonsils- atrophic Neck- flexible , trachea midline, no stridor , thyroid nl, carotid no bruit Chest - symmetrical excursion , unlabored           Heart/CV- RRR , no murmur , no gallop  , no rub, nl s1 s2                           - JVD- none , edema- none, stasis changes- none, varices- none           Lung- loose upper airway rattle// cough , dullness-none, rub- none   unlabored           Chest wall- sternal scar Abd- tender-no, distended-no, bowel sounds-present, HSM- no Br/ Gen/ Rectal- Not done, not indicated Extrem- cyanosis-  none, clubbing, none, atrophy- none, strength- nl Neuro- grossly intact to observation         Assessment & Plan:

## 2010-12-06 NOTE — Discharge Summary (Signed)
NAME:  Roberto Reilly, Roberto Reilly NO.:  [id number]  MEDICAL RECORD NO.:  [id number]           PATIENT TYPE:  I  LOCATION:  1407                         FACILITY:  [facility]  PHYSICIAN:  Altha Harm, MDDATE OF BIRTH:  August 31, 1933  DATE OF ADMISSION:  10/19/2010 DATE OF DISCHARGE:  10/22/2010                              DISCHARGE SUMMARY   DISCHARGE DISPOSITION:  Home.  FINAL DISCHARGE DIAGNOSES: 1. Acute exacerbation of chronic obstructive pulmonary disease. 2. Bradycardia with prolonged QTc felt secondary to medication     interaction. 3. Chronic diastolic heart failure. 4. Paroxysmal atrial fibrillation. 5. Chronic anticoagulation. 6. Hypertension. 7. Body mass index greater than 40. 8. Chronic hypoxic respiratory failure, untreated.  DISCHARGE MEDICATIONS:  Discharge medications include the following: 1. Doxycycline 100 mg p.o. b.i.d. x3 days. 2. Keflex 500 mg p.o. q.12 h x3 days. 3. Sotalol 40 mg p.o. b.i.d. 4. Oxygen 2 L per minute nasal cannula to be used for ambulation as     needed for shortness of breath. 5. Albuterol inhaler 2 puffs inhaled daily. 6. Aspirin 81 mg p.o. daily. 7. Lasix 40 mg p.o. daily. 8. Potassium chloride 20 mEq p.o. daily. 9. Simvastatin 40 mg p.o. daily. 10.Spiriva 18 mcg capsule inhaled daily. 11.Coumadin.  The patient to take 7.5 mg of Coumadin tonight and then     to have his INR checked and results called to Dr. Clarene Duke for     further titration of dosing of Coumadin. 12.Xopenex inhaler 2 puffs 3 times a day as needed for shortness of     breath.  CONSULTANTS:  Nanetta Batty, M.D., [facility].  PROCEDURES:  None.  DIAGNOSTIC STUDIES: 1. A 2-D echocardiogram which shows left ventricular cavity size     normal with normal systolic function.  Doppler parameters     consistent with grade 1 diastolic dysfunction. 2. A 2-view chest x-ray on admission which shows no acute     cardiopulmonary  process.  PRIMARY CARE PHYSICIAN:  Juline Patch, M.D.  PRIMARY CARDIOLOGIST:  Dr. Clarene Duke, [facility].  CODE STATUS:  Full code.  ALLERGIES:  No known drug allergies.  CHIEF COMPLAINT:  Shortness of breath.  HISTORY OF PRESENT ILLNESS:  Please refer to the H and P by Dr. Sharl Ma for details of the HPI.  However, in short, this is a 77 year old gentleman with a history of coronary artery disease, paroxysmal atrial fibrillation, COPD who presents to the hospital with worsening shortness of breath.  HOSPITAL COURSE: 1. Shortness of breath.  It was unclear initially as to whether or not     this gentleman's shortness of breath was purely due to an     exacerbation of his COPD or may have had a cardiac component to it.     The patient is suspected to have diastolic congestive heart     failure, however, did not appear to be in any fluid overload.  The     patient was continued on his usual diuretics.  Treatment for COPD     was instituted and the patient responded well to this.  The patient  had significant improvement without any need for steroids.  The     patient improved considerably on a day-to-day basis and was felt to     have may be component of tracheobronchitis and thus was placed on     Avelox.  The interaction between the Avelox and Betapace was     detrimental to the patient and thus the Avelox is being     discontinued and the patient will be continued on doxycycline     caplets to complete a total of 6 days of therapy.  The patient did     not require any steroids and when questioned the patient felt that     he was back at his baseline of functioning.  The patient does     report that at rest he is very comfortable but even walking short     distances he feels very winded but recovers quickly upon sitting.     This probably reflects the objective findings that we had here in     the hospital.  The patient did not require oxygen while sitting at      rest; however, with ambulation, his oxygen saturations dropped down     and recovered within less than a minute upon sitting.  Thus, the     patient should likely have oxygen for any activities and p.r.n.  It     was also noted that the patient had a desaturation overnight and     the patient should use oxygen while sleeping at night also. 2. Bradycardia and prolongation of QT interval.  The patient had been     on Betapace 80 mg p.o. b.i.d..  The patient apparently has been     maintained on Betapace at this dose for quite sometime without any     complications.  However, the patient is on Avelox and it is felt     that the interaction between the Avelox and Betapace likely caused     further prolongation of the QT and thus the bradycardia.  The     sotalol was decreased down to 40 mg b.i.d. and the Avelox has been     changed to doxycycline.  This patient should have his cardiac     electrical activity re-evaluated but he is off the antibiotics to     ensure that he does not need to go back to his usual dosing of     Betapace at 80 mg p.o. b.i.d.. 3. Paroxysmal atrial fibrillation with chronic anticoagulation.  The     patient was subtherapeutic here in the hospital and is currently     being titrated.  The patient per pharmacy recommendation she     received 7.5 mg of Coumadin tonight and then should have his INR     checked tomorrow for further titration of his medication.  The     patient has INR checked and called to [facility] and     Vascular who manages his Coumadin on a regular basis. 4. Hypertension.  This was well controlled.  Otherwise, the patient was stable in the hospital.  At the time of discharge, the patient is stable.  DISCHARGE PHYSICAL EXAMINATION:  His physical examination is as follows: GENERAL:  The patient is sitting comfortably at rest, however, does become winded with walking which he states is his baseline. VITAL SIGNS:  Temperature is 97.6, heart  rate is between 60 to 70, blood pressure 134/82, respiratory rate 18 to 20,  and O2 saturations were 99% on 2 L at rest, however, 92% at rest on room air.  The patient, however, does drop down O2 saturations of 84% while ambulating off oxygen and his ambulation saturations on oxygen are 92%. HEENT EXAMINATION:  He is normocephalic, atraumatic.  Pupils equally round and reactive to light and accommodation.  Extraocular movements are intact.  Oropharynx is moist.  No exudate, erythema or lesions are noted. NECK EXAMINATION:  Trachea is midline.  No masses, no thyromegaly, no JVD, no carotid bruit. RESPIRATORY EXAMINATION:  The patient has a normal respiratory effort. He has got no wheezing, rales or rhonchi noted.  The patient does not have any accessory muscle use at rest. CARDIOVASCULAR:  He has got a normal S1 and S2.  No murmurs, rubs or gallops are noted.  PMI is nondisplaced.  No heaves or thrills on palpation. ABDOMEN:  Obese, soft, nontender, nondistended.  No masses, no hepatosplenomegaly noted. EXTREMITIES:  Showed no clubbing, cyanosis or edema.  DIETARY RESTRICTIONS:  The patient should be on a heart-healthy, no added salt diet.  PHYSICAL RESTRICTIONS:  As tolerated.  Please note the patient should have oxygen 2 L per minute nasal cannula to be used with activity and at night while sleeping.  The patient may also use it p.r.n. for shortness of breath.  FOLLOWUP:  The patient is to follow up with his primary care physician, Dr. Ricki Miller, within 3 to 5 days.  He is also to follow up with his cardiologist within 1 week.  I would recommend that the patient have his Betapace re-evaluated by his cardiologist at the time of the office visit.  The patient is to have his INR checked tomorrow, October 23, 2010, and results called to Dr. Fredirick Maudlin office for further titration of medications.  Total time for this discharge process including face-to-face time, 40 minutes.     Altha Harm, MD     MAM/MEDQ  D:  10/22/2010  T:  10/22/2010  Job:  [id number]  cc:   Nanetta Batty, M.D. Fax: [phone]  Juline Patch, M.D. Fax: [phone]  Mauro Kaufmann, MD  Dr. Clarene Duke  Electronically Signed by Marthann Schiller MD on 12/06/2010 05:37:39 PM

## 2010-12-06 NOTE — Patient Instructions (Signed)
Please call us with the strength of your Symbicort-   It will be either 80/ 4.5 or 160/ 4.5  Order-PCC-  [employer]--   Home Oximetry  At rest, exertion and sleep on room air for dx COPD

## 2010-12-08 NOTE — Assessment & Plan Note (Signed)
At hosp d/c 10/22/10 listed secondary dx paroxysmal AF, chronic diastolic CHF, bradycardia from meds.

## 2011-01-06 ENCOUNTER — Ambulatory Visit (INDEPENDENT_AMBULATORY_CARE_PROVIDER_SITE_OTHER): Payer: Medicare Other | Admitting: Internal Medicine

## 2011-01-06 ENCOUNTER — Encounter: Payer: Self-pay | Admitting: Internal Medicine

## 2011-01-06 VITALS — BP 132/80 | HR 55 | Ht 72.0 in | Wt 252.8 lb

## 2011-01-06 DIAGNOSIS — I2510 Atherosclerotic heart disease of native coronary artery without angina pectoris: Secondary | ICD-10-CM

## 2011-01-06 DIAGNOSIS — J449 Chronic obstructive pulmonary disease, unspecified: Secondary | ICD-10-CM

## 2011-01-06 NOTE — Patient Instructions (Addendum)
Sample Spiriva  Use Oxygen at 2 L/M for sleep  [facility]- candidate for assistance for Spiriva??

## 2011-01-06 NOTE — Assessment & Plan Note (Signed)
Severe COPD- he doesn't want to do much and expresses need to stay with wife. I made sure he felt we were supportive and would help where we can, as long as he knows the choices. meds are ok He will continue home O2 at 2 L/M for sleep.

## 2011-01-06 NOTE — Progress Notes (Signed)
Subjective:    Patient ID: Roberto Reilly, male    DOB: 23-Oct-1933, 77 y.o.   MRN: [id number]  HPI    Review of Systems     Objective:   Physical Exam        Assessment & Plan:   Subjective:    Patient ID: Roberto Reilly, male    DOB: 1934/05/05, 77 y.o.   MRN: [id number]  HPI 12/06/10- 61 yoM former smoker, followed for COPD, complicated by CAD, diastolic heart failure, PAFib/chronic anticoagulation, HBP, Obesity Last here January 11, 2010. - Note reviewed. He was hosp for COPD exacerbation, diastolic heart failure, chronic respiratory failure  5/29-10/22/10 , but says he is back to baseline now.  He blames hot humid weather currently for cough with chest rattle with clear to trace yellow sputum. He feels well controlled now. Denies smoking. Dr Ricki Miller added Symbicort ? Strength. It may be helping some. Using ventolin rescue inhaler 0-2x/day. Triggers- weather, indoors to outdoors. Continues Spiriva. He is wearing a heart monitor for Dr Clarene Duke, potential need for pacemaker.   01/06/11-77 yoM former smoker, followed for COPD, complicated by CAD, diastolic heart failure, PAFib/chronic anticoagulation, HBP, Obesity Blames occasional bad day on "marital stress". Feels he can't leave wife long enough to have pacemaker placed.  Good and bad days with breathing are affected by the weather.  He had home O2 after hospital- did ONOX- desat < 88% for over an hour, meeting qualifying criteria for home O2 for sleep- discussed.  He again says he is not interested in having a sleep study and would not choose to treat sleep apnea if he found he had it.   Review of Systems Constitutional:   No-   weight loss, night sweats, fevers, chills, fatigue, lassitude. HEENT:   No-   headaches, difficulty swallowing, tooth/dental problems, sore throat,                  No-   sneezing, itching, ear ache, nasal congestion, post nasal drip,  CV:  No-   chest pain, orthopnea, PND, swelling in lower extremities, anasarca,  dizziness, palpitations GI:  No-   heartburn, indigestion, abdominal pain, nausea, vomiting, diarrhea,                 change in bowel habits, loss of appetite Resp: , per HPI              No-  coughing up of blood.              No-   change in color of mucus.  No- wheezing.   Skin: No-   rash or lesions. GU: No-   dysuria, change in color of urine, no urgency or frequency.  No- flank pain. MS:  No-   joint pain or swelling.  No- decreased range of motion.  No- back pain. Psych:  No- change in mood or affect. No depression or anxiety.  No memory loss.      Objective:   Physical Exam General- Alert, Oriented, Affect-appropriate, Distress- none acute   overweight Skin- rash-none, lesions- none, excoriation- none Lymphadenopathy- none Head- atraumatic            Eyes- Gross vision intact, PERRLA, conjunctivae clear secretions            Ears- Hearing, canals            Nose- Clear, No-Septal dev, mucus, polyps, erosion, perforation             Throat-  Mallampati II , mucosa clear , drainage- none, tonsils- atrophic    hoarse Neck- flexible , trachea midline, no stridor , thyroid nl, carotid no bruit Chest - symmetrical excursion , unlabored           Heart/CV- RRR , no murmur , no gallop  , no rub, nl s1 s2                           - JVD- none , edema- none, stasis changes- none, varices- none           Lung- quiet and distant , dullness-none, rub- none   unlabored           Chest wall- sternal scar Abd- tender-no, distended-no, bowel sounds-present, HSM- no Br/ Gen/ Rectal- Not done, not indicated Extrem- cyanosis- none, clubbing, none, atrophy- none, strength- nl Neuro- grossly intact to observation         Assessment & Plan:

## 2011-01-10 ENCOUNTER — Ambulatory Visit: Payer: Self-pay | Admitting: Internal Medicine

## 2011-01-17 ENCOUNTER — Encounter: Payer: Self-pay | Admitting: Internal Medicine

## 2011-02-15 LAB — DIFFERENTIAL
Basophils Absolute: 0.1
Basophils Absolute: 0.1
Basophils Relative: 1
Eosinophils Absolute: 0.1
Lymphocytes Relative: 23
Lymphs Abs: 2.3
Monocytes Absolute: 1.1 — ABNORMAL HIGH
Monocytes Relative: 13 — ABNORMAL HIGH
Neutro Abs: 6.1
Neutrophils Relative %: 68
Neutrophils Relative %: 66

## 2011-02-15 LAB — LEGIONELLA ANTIGEN, URINE

## 2011-02-15 LAB — CBC
HCT: 45.1
HCT: 50.4
Hemoglobin: 15.9
Hemoglobin: 17.3 — ABNORMAL HIGH
MCHC: 34.4
MCV: 87.2
MCV: 87.2
Platelets: 164
Platelets: 204
RBC: 5.25
RBC: 5.37
RBC: 5.78
RDW: 14
WBC: 9.1
WBC: 8

## 2011-02-15 LAB — PREPARE FRESH FROZEN PLASMA

## 2011-02-15 LAB — BASIC METABOLIC PANEL
GFR calc non Af Amer: >60
Potassium: 3.8
Sodium: 129 — ABNORMAL LOW

## 2011-02-15 LAB — POCT CARDIAC MARKERS: Troponin i, poc: <0.05

## 2011-02-15 LAB — CK TOTAL AND CKMB (NOT AT ARMC): Relative Index: INVALID

## 2011-02-15 LAB — COMPREHENSIVE METABOLIC PANEL
ALT: 18
AST: 17
Albumin: 3.4 — ABNORMAL LOW
Alkaline Phosphatase: 54
BUN: 16
CO2: 29
CO2: 30
Calcium: 9.1
Chloride: 99
Creatinine, Ser: 1.18
Creatinine, Ser: 1.28
GFR calc Af Amer: >60
GFR calc non Af Amer: 55 — ABNORMAL LOW
GFR calc non Af Amer: >60
Glucose, Bld: 109 — ABNORMAL HIGH
Potassium: 4.4
Total Bilirubin: 1.2
Total Bilirubin: 1.3 — ABNORMAL HIGH

## 2011-02-15 LAB — CARDIAC PANEL(CRET KIN+CKTOT+MB+TROPI)
CK, MB: 1.4
Relative Index: INVALID

## 2011-02-15 LAB — PROTIME-INR
INR: 5.7
INR: 6.3
Prothrombin Time: 27.6 — ABNORMAL HIGH
Prothrombin Time: 54 — ABNORMAL HIGH

## 2011-02-15 LAB — B-NATRIURETIC PEPTIDE (CONVERTED LAB)
Pro B Natriuretic peptide (BNP): 62.7
Pro B Natriuretic peptide (BNP): 51.2

## 2011-02-28 ENCOUNTER — Telehealth: Payer: Self-pay | Admitting: Internal Medicine

## 2011-02-28 NOTE — Telephone Encounter (Signed)
I spoke with patient-aware that a sample of each is at front for pick up.

## 2011-03-28 ENCOUNTER — Encounter (INDEPENDENT_AMBULATORY_CARE_PROVIDER_SITE_OTHER): Payer: Medicare Other | Admitting: Ophthalmology

## 2011-03-28 DIAGNOSIS — H35039 Hypertensive retinopathy, unspecified eye: Secondary | ICD-10-CM

## 2011-03-28 DIAGNOSIS — H43819 Vitreous degeneration, unspecified eye: Secondary | ICD-10-CM

## 2011-03-28 DIAGNOSIS — H348392 Tributary (branch) retinal vein occlusion, unspecified eye, stable: Secondary | ICD-10-CM

## 2011-04-05 ENCOUNTER — Telehealth: Payer: Self-pay | Admitting: Internal Medicine

## 2011-04-05 NOTE — Telephone Encounter (Signed)
1 box of each up front for pick up. LMOVM to make the pt aware this was done.

## 2011-05-03 ENCOUNTER — Telehealth: Payer: Self-pay | Admitting: Internal Medicine

## 2011-05-03 MED ORDER — BUDESONIDE-FORMOTEROL FUMARATE 160-4.5 MCG/ACT IN AERO: 2.0000 | INHALATION_SPRAY | Freq: Two times a day (BID) | RESPIRATORY_TRACT | Status: DC

## 2011-05-03 MED ORDER — TIOTROPIUM BROMIDE MONOHYDRATE 18 MCG IN CAPS: 18.0000 ug | ORAL_CAPSULE | Freq: Every day | RESPIRATORY_TRACT | Status: DC

## 2011-05-03 NOTE — Telephone Encounter (Signed)
lmom that samples are at front desk for pick up.

## 2011-06-01 DIAGNOSIS — Z7901 Long term (current) use of anticoagulants: Secondary | ICD-10-CM | POA: Diagnosis not present

## 2011-06-01 DIAGNOSIS — I4891 Unspecified atrial fibrillation: Secondary | ICD-10-CM | POA: Diagnosis not present

## 2011-06-06 ENCOUNTER — Telehealth: Payer: Self-pay | Admitting: Internal Medicine

## 2011-06-06 NOTE — Telephone Encounter (Signed)
I have spoke with pt and advised 1 sample of spiriva and symbicort has been left upfront for p/u. Nothing further was needed

## 2011-07-14 ENCOUNTER — Ambulatory Visit: Payer: Medicare Other | Admitting: Internal Medicine

## 2011-07-18 DIAGNOSIS — I4891 Unspecified atrial fibrillation: Secondary | ICD-10-CM | POA: Diagnosis not present

## 2011-07-18 DIAGNOSIS — Z7901 Long term (current) use of anticoagulants: Secondary | ICD-10-CM | POA: Diagnosis not present

## 2011-07-18 DIAGNOSIS — I2510 Atherosclerotic heart disease of native coronary artery without angina pectoris: Secondary | ICD-10-CM | POA: Diagnosis not present

## 2011-07-25 ENCOUNTER — Encounter (INDEPENDENT_AMBULATORY_CARE_PROVIDER_SITE_OTHER): Payer: Medicare Other | Admitting: Ophthalmology

## 2011-07-25 DIAGNOSIS — H43819 Vitreous degeneration, unspecified eye: Secondary | ICD-10-CM | POA: Diagnosis not present

## 2011-07-25 DIAGNOSIS — H26499 Other secondary cataract, unspecified eye: Secondary | ICD-10-CM

## 2011-07-25 DIAGNOSIS — H348392 Tributary (branch) retinal vein occlusion, unspecified eye, stable: Secondary | ICD-10-CM

## 2011-07-25 DIAGNOSIS — H35039 Hypertensive retinopathy, unspecified eye: Secondary | ICD-10-CM | POA: Diagnosis not present

## 2011-07-25 DIAGNOSIS — I10 Essential (primary) hypertension: Secondary | ICD-10-CM | POA: Diagnosis not present

## 2011-08-08 ENCOUNTER — Ambulatory Visit (INDEPENDENT_AMBULATORY_CARE_PROVIDER_SITE_OTHER): Payer: Medicare Other | Admitting: Ophthalmology

## 2011-08-08 DIAGNOSIS — H2700 Aphakia, unspecified eye: Secondary | ICD-10-CM

## 2011-08-09 ENCOUNTER — Ambulatory Visit: Payer: Medicare Other | Admitting: Internal Medicine

## 2011-08-16 DIAGNOSIS — Z7901 Long term (current) use of anticoagulants: Secondary | ICD-10-CM | POA: Diagnosis not present

## 2011-08-16 DIAGNOSIS — I4891 Unspecified atrial fibrillation: Secondary | ICD-10-CM | POA: Diagnosis not present

## 2011-08-18 ENCOUNTER — Telehealth: Payer: Self-pay | Admitting: Internal Medicine

## 2011-08-18 NOTE — Telephone Encounter (Signed)
2 boxes left up front. Pt aware and was advised to keep pending April appt.

## 2011-09-01 ENCOUNTER — Ambulatory Visit (INDEPENDENT_AMBULATORY_CARE_PROVIDER_SITE_OTHER)
Admission: RE | Admit: 2011-09-01 | Discharge: 2011-09-01 | Disposition: A | Discharge status: Home / Self Care | Payer: Medicare Other | Source: Ambulatory Visit | Attending: Internal Medicine | Admitting: Internal Medicine

## 2011-09-01 ENCOUNTER — Encounter: Payer: Self-pay | Admitting: Internal Medicine

## 2011-09-01 ENCOUNTER — Ambulatory Visit (INDEPENDENT_AMBULATORY_CARE_PROVIDER_SITE_OTHER): Payer: Medicare Other | Admitting: Internal Medicine

## 2011-09-01 VITALS — BP 112/74 | HR 53 | Ht 72.0 in | Wt 248.8 lb

## 2011-09-01 DIAGNOSIS — F341 Dysthymic disorder: Secondary | ICD-10-CM

## 2011-09-01 DIAGNOSIS — Z7901 Long term (current) use of anticoagulants: Secondary | ICD-10-CM | POA: Diagnosis not present

## 2011-09-01 DIAGNOSIS — J449 Chronic obstructive pulmonary disease, unspecified: Secondary | ICD-10-CM

## 2011-09-01 DIAGNOSIS — F329 Major depressive disorder, single episode, unspecified: Secondary | ICD-10-CM

## 2011-09-01 DIAGNOSIS — J4489 Other specified chronic obstructive pulmonary disease: Secondary | ICD-10-CM

## 2011-09-01 DIAGNOSIS — I4891 Unspecified atrial fibrillation: Secondary | ICD-10-CM | POA: Diagnosis not present

## 2011-09-01 NOTE — Progress Notes (Signed)
Quick Note:  Called, spoke with pt. I informed him cxr showed nothing new - there are changes from old surgery and COPD per Dr. Maple Hudson. He verbalized understanding of this and voiced no further questions/concerns at this time. ______

## 2011-09-01 NOTE — Patient Instructions (Signed)
Order- CXR dx COPD  Sample Spiriva   1 daily  We will try to help with request for manufacturer's assistance for xopenex and Spriva

## 2011-09-01 NOTE — Progress Notes (Signed)
Patient ID: Roberto Reilly, male    DOB: 20-Oct-1933, 78 y.o.   MRN: [id number]  HPI 12/06/10- 69 yoM former smoker, followed for COPD, complicated by CAD, diastolic heart failure, PAFib/chronic anticoagulation, HBP, Obesity Last here January 11, 2010. - Note reviewed. He was hosp for COPD exacerbation, diastolic heart failure, chronic respiratory failure  5/29-10/22/10 , but says he is back to baseline now.  He blames hot humid weather currently for cough with chest rattle with clear to trace yellow sputum. He feels well controlled now. Denies smoking. Dr Ricki Miller added Symbicort ? Strength. It may be helping some. Using ventolin rescue inhaler 0-2x/day. Triggers- weather, indoors to outdoors. Continues Spiriva. He is wearing a heart monitor for Dr Clarene Duke, potential need for pacemaker.   01/06/11-77 yoM former smoker, followed for COPD, complicated by CAD, diastolic heart failure, PAFib/chronic anticoagulation, HBP, Obesity Blames occasional bad day on "marital stress". Feels he can't leave wife long enough to have pacemaker placed.  Good and bad days with breathing are affected by the weather.  He had home O2 after hospital- did ONOX- desat < 88% for over an hour, meeting qualifying criteria for home O2 for sleep- discussed.  He again says he is not interested in having a sleep study and would not choose to treat sleep apnea if he found he had it.   09/01/11- 77 yoM former smoker, followed for COPD, complicated by CAD, diastolic heart failure, PAFib/chronic anticoagulation, HBP, Obesity, old right thoracotomy Wife fell and broke her right arm so he has to do the housework and help her which is very depressing and confining. Breathing has been stable except he has to use his nebulizer at night. Gets up repeatedly for wife and he is tired and worn out. Little cough with scant clear phlegm. Did not qualify for help with Spiriva cost. Our office is looking at that. He did qualify for Xopenex. Using Symbicort and  nebulizer with no new heart issues since last here.  Review of Systems Constitutional:   No-   weight loss, night sweats, fevers, chills, fatigue, lassitude. HEENT:   No-   headaches, difficulty swallowing, tooth/dental problems, sore throat,                  No-   sneezing, itching, ear ache, nasal congestion, post nasal drip,  CV:  No-   chest pain, orthopnea, PND, swelling in lower extremities, anasarca, dizziness, palpitations GI:  No-   heartburn, indigestion, abdominal pain, nausea, vomiting, diarrhea,                 change in bowel habits, loss of appetite Resp: , per HPI              No-  coughing up of blood.              No-   change in color of mucus.  + wheezing.   Skin: No-   rash or lesions. GU: . MS:  No-   joint pain or swelling.   Psych:  + change in mood or affect. + depression or anxiety.  No memory loss.      Objective:   Physical Exam General- Alert, Oriented, Affect-appropriate, Distress- none acute   overweight Skin- rash-none, lesions- none, excoriation- none. + coumadin bruising Lymphadenopathy- none Head- atraumatic            Eyes- Gross vision intact, PERRLA, conjunctivae clear secretions            Ears-  Hearing, canals            Nose- Clear, No-Septal dev, mucus, polyps, erosion, perforation             Throat- Mallampati II , mucosa clear , drainage- none, tonsils- atrophic    hoarse Neck- flexible , trachea midline, no stridor , thyroid nl, carotid no bruit Chest - symmetrical excursion , unlabored           Heart/CV- RRR( no pacer) , no murmur , no gallop  , no rub, nl s1 s2                           - JVD- none , edema- none, stasis changes- none, varices- none           Lung- quiet and distant , + loose cough, dullness-none, rub- none   unlabored           Chest wall- sternal scar Abd-  Br/ Gen/ Rectal- Not done, not indicated Extrem- cyanosis- none, clubbing, none, atrophy- none, strength- nl Neuro- grossly intact to observation

## 2011-09-06 DIAGNOSIS — F329 Major depressive disorder, single episode, unspecified: Secondary | ICD-10-CM | POA: Insufficient documentation

## 2011-09-06 NOTE — Assessment & Plan Note (Signed)
Sleep disruption and the progressive dependency of a much older wife are beginning to catch up with him.

## 2011-09-06 NOTE — Assessment & Plan Note (Signed)
He uses oxygen for sleep. Pulmonary status has been stable. We will continue trying to help him qualify for Spiriva and for Xopenex assistance. Plan-update chest x-ray.

## 2011-09-13 DIAGNOSIS — J449 Chronic obstructive pulmonary disease, unspecified: Secondary | ICD-10-CM | POA: Diagnosis not present

## 2011-09-13 DIAGNOSIS — E559 Vitamin D deficiency, unspecified: Secondary | ICD-10-CM | POA: Diagnosis not present

## 2011-09-13 DIAGNOSIS — E785 Hyperlipidemia, unspecified: Secondary | ICD-10-CM | POA: Diagnosis not present

## 2011-09-13 DIAGNOSIS — I2510 Atherosclerotic heart disease of native coronary artery without angina pectoris: Secondary | ICD-10-CM | POA: Diagnosis not present

## 2011-09-27 ENCOUNTER — Telehealth: Payer: Self-pay | Admitting: Internal Medicine

## 2011-09-27 DIAGNOSIS — Z7901 Long term (current) use of anticoagulants: Secondary | ICD-10-CM | POA: Diagnosis not present

## 2011-09-27 DIAGNOSIS — I4891 Unspecified atrial fibrillation: Secondary | ICD-10-CM | POA: Diagnosis not present

## 2011-09-27 MED ORDER — LEVALBUTEROL TARTRATE 45 MCG/ACT IN AERO: 2.0000 | INHALATION_SPRAY | Freq: Four times a day (QID) | RESPIRATORY_TRACT | Status: DC | PRN

## 2011-09-27 NOTE — Telephone Encounter (Signed)
I spoke with pt and needed a refill on his xopenex inhaler. i advised pt will send rx and nothing further was needed

## 2011-10-11 ENCOUNTER — Telehealth: Payer: Self-pay | Admitting: Internal Medicine

## 2011-10-12 ENCOUNTER — Telehealth: Payer: Self-pay | Admitting: Internal Medicine

## 2011-10-12 NOTE — Telephone Encounter (Signed)
Approval received today good through 05-22-12 under Medicare part D; Karin Golden at [phone] is aware.

## 2011-10-12 NOTE — Telephone Encounter (Signed)
I spoke with pt and is aware samples have been left upfront for p/u. Also pt is wanting to know if we have heard anything abck regarding his Patient Assistance for the spiriva and symbicort. Please advise Florentina Addison thanks

## 2011-10-14 MED ORDER — BUDESONIDE-FORMOTEROL FUMARATE 160-4.5 MCG/ACT IN AERO: 2.0000 | INHALATION_SPRAY | Freq: Two times a day (BID) | RESPIRATORY_TRACT | Status: DC

## 2011-10-14 NOTE — Telephone Encounter (Signed)
Pt Assistance information has been faxed to companies-waiting on response-will need to recheck with companies on Tuesday as our office is closed on Monday for Memorial Day. Left message about this on patients phone. If any questions or concerns pt is to call our office and speak directly with me.

## 2011-10-21 NOTE — Telephone Encounter (Signed)
I spoke with both patient assistance programs and neither one could give me a decision at this time if patient is approved. I would need to call back on Tuesday 10-25-11 to check the status again .

## 2011-10-26 NOTE — Telephone Encounter (Signed)
Pt returned Katie's call & can be reached at [phone].  Roberto Reilly

## 2011-10-26 NOTE — Telephone Encounter (Signed)
I have left message for patient to call and speak directly with me regarding his patient assistance medications. There are some other questions that must be answered before a decision could be made. Will await a call from patient.

## 2011-10-26 NOTE — Telephone Encounter (Signed)
Pt called back-got answers for pt assistance program information. Pt is aware that I am re faxing the info and will keep him updated accordingly.

## 2011-10-27 DIAGNOSIS — I4891 Unspecified atrial fibrillation: Secondary | ICD-10-CM | POA: Diagnosis not present

## 2011-10-27 DIAGNOSIS — Z7901 Long term (current) use of anticoagulants: Secondary | ICD-10-CM | POA: Diagnosis not present

## 2011-11-07 NOTE — Telephone Encounter (Signed)
Katie, have you heard from patient assistance?  Thanks.

## 2011-11-10 NOTE — Telephone Encounter (Signed)
I have spoke with patient; he states he got a call from [employer] stating they need a letter from [facility] that patient is unable to afford Symbicort and needs this for treatment. CY please advise.

## 2011-11-14 ENCOUNTER — Telehealth: Payer: Self-pay | Admitting: Internal Medicine

## 2011-11-14 NOTE — Telephone Encounter (Signed)
Pt aware I have left 2 samples of spiriva and 1 sample of spiriva left upfront for pick up. Nothing further was needed

## 2011-11-25 NOTE — Telephone Encounter (Signed)
CY and I are working together on this for the patient-pt has been kept in the loop about this entire process. Thanks.

## 2011-11-25 NOTE — Telephone Encounter (Signed)
Katie, pls advise if this has been taken care of.  Thank you.

## 2011-11-29 DIAGNOSIS — Z7901 Long term (current) use of anticoagulants: Secondary | ICD-10-CM | POA: Diagnosis not present

## 2011-11-29 DIAGNOSIS — I4891 Unspecified atrial fibrillation: Secondary | ICD-10-CM | POA: Diagnosis not present

## 2011-12-07 NOTE — Telephone Encounter (Signed)
Please advise if you are going to write letter for patient.

## 2011-12-20 ENCOUNTER — Encounter: Payer: Self-pay | Admitting: *Deleted

## 2011-12-20 NOTE — Telephone Encounter (Signed)
Per CY okay to complete letter for patient assistance program. Letter created in [employer] and sent with CY's signature. Will sign off on note and wait for answer from Symbicort pt assistance program.

## 2011-12-20 NOTE — Telephone Encounter (Signed)
Katie, pls advise on the status of this.  Thank you. 

## 2012-01-03 DIAGNOSIS — J449 Chronic obstructive pulmonary disease, unspecified: Secondary | ICD-10-CM | POA: Diagnosis not present

## 2012-01-03 DIAGNOSIS — I10 Essential (primary) hypertension: Secondary | ICD-10-CM | POA: Diagnosis not present

## 2012-01-03 DIAGNOSIS — I2510 Atherosclerotic heart disease of native coronary artery without angina pectoris: Secondary | ICD-10-CM | POA: Diagnosis not present

## 2012-01-03 DIAGNOSIS — Z7901 Long term (current) use of anticoagulants: Secondary | ICD-10-CM | POA: Diagnosis not present

## 2012-01-03 DIAGNOSIS — I4891 Unspecified atrial fibrillation: Secondary | ICD-10-CM | POA: Diagnosis not present

## 2012-01-25 ENCOUNTER — Telehealth: Payer: Self-pay | Admitting: Internal Medicine

## 2012-01-25 NOTE — Telephone Encounter (Signed)
Pt informed that samples of Spiriva and Symbicort were left at front desk for pick up.

## 2012-01-31 DIAGNOSIS — Z7901 Long term (current) use of anticoagulants: Secondary | ICD-10-CM | POA: Diagnosis not present

## 2012-01-31 DIAGNOSIS — I4891 Unspecified atrial fibrillation: Secondary | ICD-10-CM | POA: Diagnosis not present

## 2012-02-13 ENCOUNTER — Telehealth: Payer: Self-pay | Admitting: Internal Medicine

## 2012-02-13 MED ORDER — LEVALBUTEROL TARTRATE 45 MCG/ACT IN AERO: 2.0000 | INHALATION_SPRAY | Freq: Four times a day (QID) | RESPIRATORY_TRACT | Status: DC | PRN

## 2012-02-13 MED ORDER — TIOTROPIUM BROMIDE MONOHYDRATE 18 MCG IN CAPS: 18.0000 ug | ORAL_CAPSULE | Freq: Every day | RESPIRATORY_TRACT | Status: DC

## 2012-02-13 NOTE — Telephone Encounter (Signed)
Called, spoke with pt.  Requesting samples of spiriva and xopenex hfa.  Per Florentina Addison, ok to give pt samples.  Pt aware samples at front for pick up.  He verbalized understanding and was very appreciative of this.

## 2012-02-28 DIAGNOSIS — I4891 Unspecified atrial fibrillation: Secondary | ICD-10-CM | POA: Diagnosis not present

## 2012-02-28 DIAGNOSIS — Z7901 Long term (current) use of anticoagulants: Secondary | ICD-10-CM | POA: Diagnosis not present

## 2012-03-08 ENCOUNTER — Encounter: Payer: Self-pay | Admitting: Internal Medicine

## 2012-03-08 ENCOUNTER — Ambulatory Visit (INDEPENDENT_AMBULATORY_CARE_PROVIDER_SITE_OTHER): Payer: Medicare Other | Admitting: Internal Medicine

## 2012-03-08 VITALS — BP 138/86 | HR 55 | Ht 72.0 in | Wt 249.4 lb

## 2012-03-08 DIAGNOSIS — J449 Chronic obstructive pulmonary disease, unspecified: Secondary | ICD-10-CM | POA: Diagnosis not present

## 2012-03-08 DIAGNOSIS — R5381 Other malaise: Secondary | ICD-10-CM | POA: Diagnosis not present

## 2012-03-08 DIAGNOSIS — Z23 Encounter for immunization: Secondary | ICD-10-CM

## 2012-03-08 DIAGNOSIS — E559 Vitamin D deficiency, unspecified: Secondary | ICD-10-CM | POA: Diagnosis not present

## 2012-03-08 DIAGNOSIS — R5383 Other fatigue: Secondary | ICD-10-CM | POA: Diagnosis not present

## 2012-03-08 DIAGNOSIS — J4489 Other specified chronic obstructive pulmonary disease: Secondary | ICD-10-CM

## 2012-03-08 DIAGNOSIS — I2510 Atherosclerotic heart disease of native coronary artery without angina pectoris: Secondary | ICD-10-CM | POA: Diagnosis not present

## 2012-03-08 MED ORDER — ALBUTEROL SULFATE HFA 108 (90 BASE) MCG/ACT IN AERS: 2.0000 | INHALATION_SPRAY | Freq: Four times a day (QID) | RESPIRATORY_TRACT | Status: DC | PRN

## 2012-03-08 NOTE — Assessment & Plan Note (Signed)
Using oxygen less regularly for sleep- discussed. Clinically about the same. Xopenex too expensive.  Plan- samples of Spiriva, change Xopenex to albuterol rescue inhaler, flu vax.

## 2012-03-08 NOTE — Progress Notes (Signed)
Patient ID: Roberto Reilly, male    DOB: 1934-02-02, 78 y.o.   MRN: [id number]  HPI 12/06/10- 32 yoM former smoker, followed for COPD, complicated by CAD, diastolic heart failure, PAFib/chronic anticoagulation, HBP, Obesity Last here January 11, 2010. - Note reviewed. He was hosp for COPD exacerbation, diastolic heart failure, chronic respiratory failure  5/29-10/22/10 , but says he is back to baseline now.  He blames hot humid weather currently for cough with chest rattle with clear to trace yellow sputum. He feels well controlled now. Denies smoking. Dr Ricki Miller added Symbicort ? Strength. It may be helping some. Using ventolin rescue inhaler 0-2x/day. Triggers- weather, indoors to outdoors. Continues Spiriva. He is wearing a heart monitor for Dr Clarene Duke, potential need for pacemaker.   01/06/11-77 yoM former smoker, followed for COPD, complicated by CAD, diastolic heart failure, PAFib/chronic anticoagulation, HBP, Obesity Blames occasional bad day on "marital stress". Feels he can't leave wife long enough to have pacemaker placed.  Good and bad days with breathing are affected by the weather.  He had home O2 after hospital- did ONOX- desat < 88% for over an hour, meeting qualifying criteria for home O2 for sleep- discussed.  He again says he is not interested in having a sleep study and would not choose to treat sleep apnea if he found he had it.   09/01/11- 77 yoM former smoker, followed for COPD, complicated by CAD, diastolic heart failure, PAFib/chronic anticoagulation, HBP, Obesity, old right thoracotomy Wife fell and broke her right arm so he has to do the housework and help her which is very depressing and confining. Breathing has been stable except he has to use his nebulizer at night. Gets up repeatedly for wife and he is tired and worn out. Little cough with scant clear phlegm. Did not qualify for help with Spiriva cost. Our office is looking at that. He did qualify for Xopenex. Using Symbicort and  nebulizer with no new heart issues since last here.  03/08/12- 77 yoM former smoker, followed for COPD, complicated by CAD, diastolic heart failure, PAFib/chronic anticoagulation, HBP, Obesity, old right thoracotomy Increased SOB with activity; denies any wheezing, cough, or congestion. COPD Assessment Test (CAT) score 14/40 Can't afford Spiriva and doesn't qualify for assistance. Asks samples. Occasional cough with clear phlegm, no wheeze, chest pain or blood.  Uses O2 at night most nights. We discussed O2 therapy. CXR 09/01/11 IMPRESSION:  Again noted mild hyperinflation. Stable chronic blunting of the  right costophrenic angle and mild elevation of the right  hemidiaphragm. This may be due to pleural thickening, scarring or  small right pleural effusion. No acute infiltrate or pulmonary  edema. Question old right lower healed rib fractures.  Original Report Authenticated By: Natasha Mead, M.D.    Review of Systems- see HPI Constitutional:   No-   weight loss, night sweats, fevers, chills, fatigue, lassitude. HEENT:   No-   headaches, difficulty swallowing, tooth/dental problems, sore throat,                  No-   sneezing, itching, ear ache, nasal congestion, post nasal drip,  CV:  No-   chest pain, orthopnea, PND, swelling in lower extremities, anasarca, dizziness, palpitations GI:  No-   heartburn, indigestion, abdominal pain, nausea, vomiting, diarrhea,                 change in bowel habits, loss of appetite Resp: , per HPI  No-  coughing up of blood.              No-   change in color of mucus.  + wheezing.   Skin: No-   rash or lesions. GU: . MS:  No-   joint pain or swelling.   Psych:  + change in mood or affect. + depression or anxiety.  No memory loss.   Objective:   Physical Exam General- Alert, Oriented, Affect-appropriate, Distress- none acute   overweight Skin- rash-none, lesions- none, excoriation- none. + coumadin bruising Lymphadenopathy- none Head-  atraumatic            Eyes- Gross vision intact, PERRLA, conjunctivae clear secretions            Ears- Hearing, canals            Nose- Clear, No-Septal dev, mucus, polyps, erosion, perforation             Throat- Mallampati II , mucosa clear , drainage- none, tonsils- atrophic    hoarse Neck- flexible , trachea midline, no stridor , thyroid nl, carotid no bruit Chest - symmetrical excursion , unlabored           Heart/CV- RRR( no pacer) , no murmur , no gallop  , no rub, nl s1 s2                           - JVD- none , edema- none, stasis changes- none, varices- none           Lung- quiet and distant , + loose cough, dullness-none, rub- none   unlabored           Chest wall- sternal scar Abd-  Br/ Gen/ Rectal- Not done, not indicated Extrem- cyanosis- none, clubbing, none, atrophy- none, strength- nl Neuro- grossly intact to observation

## 2012-03-08 NOTE — Patient Instructions (Addendum)
Flu vax  Samples X 2 Spiriva  Script for albuterol HFA rescue inhaler- see if this is cheaper for you than the Xopenex HFA inhaler

## 2012-03-14 DIAGNOSIS — E785 Hyperlipidemia, unspecified: Secondary | ICD-10-CM | POA: Diagnosis not present

## 2012-03-14 DIAGNOSIS — J449 Chronic obstructive pulmonary disease, unspecified: Secondary | ICD-10-CM | POA: Diagnosis not present

## 2012-03-14 DIAGNOSIS — I10 Essential (primary) hypertension: Secondary | ICD-10-CM | POA: Diagnosis not present

## 2012-03-14 DIAGNOSIS — I2510 Atherosclerotic heart disease of native coronary artery without angina pectoris: Secondary | ICD-10-CM | POA: Diagnosis not present

## 2012-03-27 DIAGNOSIS — Z7901 Long term (current) use of anticoagulants: Secondary | ICD-10-CM | POA: Diagnosis not present

## 2012-03-27 DIAGNOSIS — I4891 Unspecified atrial fibrillation: Secondary | ICD-10-CM | POA: Diagnosis not present

## 2012-04-09 ENCOUNTER — Telehealth: Payer: Self-pay | Admitting: Internal Medicine

## 2012-04-09 NOTE — Telephone Encounter (Signed)
Sample Spiriva x1 placed up front for patient to pick up and message routed to CY/Katie to handle from here. Patient states that he was denied to Patient Assistance program due to him having [employer]/Medicare and he still cannot afford the medications out of pocket. Patient is aware that we can no longer provide him with samples monthly to cover his treatment. Message being sent to CY/Katie to follow up and change treatment course if need be.   CY please advise. Thanks.

## 2012-04-09 NOTE — Telephone Encounter (Signed)
Please advise if okay to give samples. Last OV 03/08/12. thanks

## 2012-04-09 NOTE — Telephone Encounter (Signed)
Ok to give samples and also advise of patient assistance program for patient if having trouble getting/paying for medication as we can not continue to provide patient with samples.

## 2012-04-09 NOTE — Telephone Encounter (Signed)
Roberto Reilly doesn't need Spiriva. He already has nebulizer with duoneb (albuterol plus ipratropium). The ipratropium does the same job as spiriva. He can take neb up to 4 times daily if needed. He should be able to get the nebulizer medication from his home care company through Medicare Part B.

## 2012-04-10 NOTE — Telephone Encounter (Signed)
lmomtcb x1 

## 2012-04-10 NOTE — Telephone Encounter (Signed)
Patient returned call, advised that he does not need spiriva as he is already on duoneb w/ the ipratropium per CY below.  Pt okay with this recommendation and verbalized his understanding.  Pt to call w/ any further questions.  Nothing further needed at this time; will sign off.

## 2012-05-08 DIAGNOSIS — Z7901 Long term (current) use of anticoagulants: Secondary | ICD-10-CM | POA: Diagnosis not present

## 2012-05-08 DIAGNOSIS — I4891 Unspecified atrial fibrillation: Secondary | ICD-10-CM | POA: Diagnosis not present

## 2012-06-12 DIAGNOSIS — Z951 Presence of aortocoronary bypass graft: Secondary | ICD-10-CM | POA: Diagnosis not present

## 2012-06-12 DIAGNOSIS — E782 Mixed hyperlipidemia: Secondary | ICD-10-CM | POA: Diagnosis not present

## 2012-06-12 DIAGNOSIS — I2510 Atherosclerotic heart disease of native coronary artery without angina pectoris: Secondary | ICD-10-CM | POA: Diagnosis not present

## 2012-06-12 DIAGNOSIS — J449 Chronic obstructive pulmonary disease, unspecified: Secondary | ICD-10-CM | POA: Diagnosis not present

## 2012-07-10 DIAGNOSIS — Z7901 Long term (current) use of anticoagulants: Secondary | ICD-10-CM | POA: Diagnosis not present

## 2012-07-10 DIAGNOSIS — I4891 Unspecified atrial fibrillation: Secondary | ICD-10-CM | POA: Diagnosis not present

## 2012-08-08 ENCOUNTER — Ambulatory Visit (INDEPENDENT_AMBULATORY_CARE_PROVIDER_SITE_OTHER): Payer: Medicare Other | Admitting: Ophthalmology

## 2012-08-08 DIAGNOSIS — I10 Essential (primary) hypertension: Secondary | ICD-10-CM

## 2012-08-08 DIAGNOSIS — H35039 Hypertensive retinopathy, unspecified eye: Secondary | ICD-10-CM

## 2012-08-08 DIAGNOSIS — H43819 Vitreous degeneration, unspecified eye: Secondary | ICD-10-CM

## 2012-08-08 DIAGNOSIS — H348392 Tributary (branch) retinal vein occlusion, unspecified eye, stable: Secondary | ICD-10-CM | POA: Diagnosis not present

## 2012-08-09 ENCOUNTER — Ambulatory Visit: Payer: Self-pay | Admitting: Cardiology

## 2012-08-09 DIAGNOSIS — Z7901 Long term (current) use of anticoagulants: Secondary | ICD-10-CM | POA: Insufficient documentation

## 2012-08-09 DIAGNOSIS — I4891 Unspecified atrial fibrillation: Secondary | ICD-10-CM

## 2012-08-21 DIAGNOSIS — Z7901 Long term (current) use of anticoagulants: Secondary | ICD-10-CM | POA: Diagnosis not present

## 2012-08-21 DIAGNOSIS — I4891 Unspecified atrial fibrillation: Secondary | ICD-10-CM | POA: Diagnosis not present

## 2012-09-06 ENCOUNTER — Encounter: Payer: Self-pay | Admitting: Internal Medicine

## 2012-09-06 ENCOUNTER — Ambulatory Visit (INDEPENDENT_AMBULATORY_CARE_PROVIDER_SITE_OTHER): Payer: Medicare Other | Admitting: Internal Medicine

## 2012-09-06 ENCOUNTER — Ambulatory Visit (INDEPENDENT_AMBULATORY_CARE_PROVIDER_SITE_OTHER)
Admission: RE | Admit: 2012-09-06 | Discharge: 2012-09-06 | Disposition: A | Discharge status: Home / Self Care | Payer: Medicare Other | Source: Ambulatory Visit | Attending: Internal Medicine | Admitting: Internal Medicine

## 2012-09-06 VITALS — BP 112/60 | HR 57 | Ht 72.0 in | Wt 241.0 lb

## 2012-09-06 DIAGNOSIS — J441 Chronic obstructive pulmonary disease with (acute) exacerbation: Secondary | ICD-10-CM | POA: Diagnosis not present

## 2012-09-06 DIAGNOSIS — J438 Other emphysema: Secondary | ICD-10-CM | POA: Diagnosis not present

## 2012-09-06 DIAGNOSIS — J984 Other disorders of lung: Secondary | ICD-10-CM | POA: Diagnosis not present

## 2012-09-06 DIAGNOSIS — J439 Emphysema, unspecified: Secondary | ICD-10-CM

## 2012-09-06 DIAGNOSIS — I10 Essential (primary) hypertension: Secondary | ICD-10-CM | POA: Diagnosis not present

## 2012-09-06 DIAGNOSIS — I2510 Atherosclerotic heart disease of native coronary artery without angina pectoris: Secondary | ICD-10-CM | POA: Diagnosis not present

## 2012-09-06 NOTE — Progress Notes (Signed)
Patient ID: Roberto Reilly, male    DOB: [birth year], 79 y.o.   MRN: [id number]  HPI 12/06/10- 70 yoM former smoker, followed for COPD, complicated by CAD, diastolic heart failure, PAFib/chronic anticoagulation, HBP, Obesity Last here January 11, 2010. - Note reviewed. He was hosp for COPD exacerbation, diastolic heart failure, chronic respiratory failure  5/29-10/22/10 , but says he is back to baseline now.  He blames hot humid weather currently for cough with chest rattle with clear to trace yellow sputum. He feels well controlled now. Denies smoking. Dr Ricki Miller added Symbicort ? Strength. It may be helping some. Using ventolin rescue inhaler 0-2x/day. Triggers- weather, indoors to outdoors. Continues Spiriva. He is wearing a heart monitor for Dr Clarene Duke, potential need for pacemaker.   01/06/11-77 yoM former smoker, followed for COPD, complicated by CAD, diastolic heart failure, PAFib/chronic anticoagulation, HBP, Obesity Blames occasional bad day on "marital stress". Feels he can't leave wife long enough to have pacemaker placed.  Good and bad days with breathing are affected by the weather.  He had home O2 after hospital- did ONOX- desat < 88% for over an hour, meeting qualifying criteria for home O2 for sleep- discussed.  He again says he is not interested in having a sleep study and would not choose to treat sleep apnea if he found he had it.   09/01/11- 77 yoM former smoker, followed for COPD, complicated by CAD, diastolic heart failure, PAFib/chronic anticoagulation, HBP, Obesity, old right thoracotomy Wife fell and broke her right arm so he has to do the housework and help her which is very depressing and confining. Breathing has been stable except he has to use his nebulizer at night. Gets up repeatedly for wife and he is tired and worn out. Little cough with scant clear phlegm. Did not qualify for help with Spiriva cost. Our office is looking at that. He did qualify for Xopenex. Using Symbicort and  nebulizer with no new heart issues since last here.  03/08/12- 77 yoM former smoker, followed for COPD, complicated by CAD, diastolic heart failure, PAFib/chronic anticoagulation, HBP, Obesity, old right thoracotomy Increased SOB with activity; denies any wheezing, cough, or congestion. COPD Assessment Test (CAT) score 14/40 Can't afford Spiriva and doesn't qualify for assistance. Asks samples. Occasional cough with clear phlegm, no wheeze, chest pain or blood.  Uses O2 at night most nights. We discussed O2 therapy. CXR 09/01/11 IMPRESSION:  Again noted mild hyperinflation. Stable chronic blunting of the  right costophrenic angle and mild elevation of the right  hemidiaphragm. This may be due to pleural thickening, scarring or  small right pleural effusion. No acute infiltrate or pulmonary  edema. Question old right lower healed rib fractures.  Original Report Authenticated By: Natasha Mead, M.D.   4/17/ 14- 77 yoM former smoker, followed for COPD, complicated by CAD, diastolic heart failure, PAFib/chronic anticoagulation, HBP, Obesity, old right thoracotomy FOLLOWS FOR: Breathing is unchanged. Reports DOE and a dry cough from time to time. Denies chest pain or chest tightness. He is still using a leftover Xopenex, but has prescription for albuterol. Hopefully that won't bother his atrial fibrillation.  Review of Systems- see HPI Constitutional:   No-   weight loss, night sweats, fevers, chills, fatigue, lassitude. HEENT:   No-   headaches, difficulty swallowing, tooth/dental problems, sore throat,                  No-   sneezing, itching, ear ache, nasal congestion, post nasal drip,  CV:  No-  chest pain, orthopnea, PND, swelling in lower extremities, anasarca, dizziness, palpitations GI:  No-   heartburn, indigestion, abdominal pain, nausea, vomiting, diarrhea,                 change in bowel habits, loss of appetite Resp: , per HPI              No-  coughing up of blood.               No-   change in color of mucus.  +occasional wheezing.   Skin: No-   rash or lesions. GU: . MS:  No-   joint pain or swelling.   Psych:  + change in mood or affect. + depression or anxiety.  No memory loss.   Objective:   Physical Exam General- Alert, Oriented, Affect-appropriate, Distress- none acute   overweight Skin- rash-none, lesions- none, excoriation- none. + coumadin bruising Lymphadenopathy- none Head- atraumatic            Eyes- Gross vision intact, PERRLA, conjunctivae clear secretions            Ears- Hearing, canals            Nose- Clear, No-Septal dev, mucus, polyps, erosion, perforation             Throat- Mallampati II , mucosa clear , drainage- none, tonsils- atrophic    hoarse Neck- flexible , trachea midline, no stridor , thyroid nl, carotid no bruit Chest - symmetrical excursion , unlabored           Heart/CV- RRR( no pacer) , no murmur , no gallop  , no rub, nl s1 s2                           - JVD- none , edema- none, stasis changes- none, varices- none           Lung- distant with a few light rhonchi , no- cough, dullness-none, rub- none   unlabored           Chest wall- sternal scar Abd-  Br/ Gen/ Rectal- Not done, not indicated Extrem- cyanosis- none, clubbing, none, atrophy- none, strength- nl Neuro- grossly intact to observation

## 2012-09-06 NOTE — Patient Instructions (Addendum)
You have a prescription at your drug store for an "albuterol HFA rescue inhaler"  Common brands are ProAir, Proventil, Ventolin  Order- CXR  Dx COPD  Please call as needed

## 2012-09-11 DIAGNOSIS — Z0000 Encounter for general adult medical examination without abnormal findings: Secondary | ICD-10-CM | POA: Diagnosis not present

## 2012-09-11 DIAGNOSIS — J449 Chronic obstructive pulmonary disease, unspecified: Secondary | ICD-10-CM | POA: Diagnosis not present

## 2012-09-11 DIAGNOSIS — I4891 Unspecified atrial fibrillation: Secondary | ICD-10-CM | POA: Diagnosis not present

## 2012-09-11 DIAGNOSIS — I2510 Atherosclerotic heart disease of native coronary artery without angina pectoris: Secondary | ICD-10-CM | POA: Diagnosis not present

## 2012-09-12 DIAGNOSIS — Z7901 Long term (current) use of anticoagulants: Secondary | ICD-10-CM | POA: Diagnosis not present

## 2012-09-12 DIAGNOSIS — I4891 Unspecified atrial fibrillation: Secondary | ICD-10-CM | POA: Diagnosis not present

## 2012-09-14 ENCOUNTER — Other Ambulatory Visit: Payer: Self-pay | Admitting: Internal Medicine

## 2012-09-14 DIAGNOSIS — R911 Solitary pulmonary nodule: Secondary | ICD-10-CM

## 2012-09-14 NOTE — Assessment & Plan Note (Signed)
No major change. There will be very slow long-term progression of his disease. Plan-chest x-ray. Try prescription for albuterol HFA rescue inhaler. Potential stimulation discussed.

## 2012-09-14 NOTE — Progress Notes (Signed)
Quick Note:  Pt aware of results and future order placed for CXR dx lung nodule for on or around 12-06-12. ______

## 2012-09-27 ENCOUNTER — Other Ambulatory Visit: Payer: Self-pay | Admitting: Internal Medicine

## 2012-09-27 MED ORDER — ALBUTEROL SULFATE HFA 108 (90 BASE) MCG/ACT IN AERS: 2.0000 | INHALATION_SPRAY | Freq: Four times a day (QID) | RESPIRATORY_TRACT | Status: DC | PRN

## 2012-10-23 ENCOUNTER — Ambulatory Visit (INDEPENDENT_AMBULATORY_CARE_PROVIDER_SITE_OTHER): Payer: Medicare Other | Admitting: Pharmacist Clinician (PhC)/ Clinical Pharmacy Specialist

## 2012-10-23 VITALS — BP 140/72 | HR 52

## 2012-10-23 DIAGNOSIS — Z7901 Long term (current) use of anticoagulants: Secondary | ICD-10-CM | POA: Diagnosis not present

## 2012-10-23 DIAGNOSIS — I4891 Unspecified atrial fibrillation: Secondary | ICD-10-CM | POA: Diagnosis not present

## 2012-10-23 LAB — POCT INR: INR: 2.7

## 2012-11-05 ENCOUNTER — Other Ambulatory Visit: Payer: Self-pay | Admitting: Cardiology

## 2012-11-19 ENCOUNTER — Other Ambulatory Visit: Payer: Self-pay | Admitting: Cardiology

## 2012-11-19 NOTE — Telephone Encounter (Signed)
Rx was sent to pharmacy electronically. 

## 2012-11-20 ENCOUNTER — Ambulatory Visit: Payer: Medicare Other | Admitting: Pharmacist Clinician (PhC)/ Clinical Pharmacy Specialist

## 2012-11-20 DIAGNOSIS — J441 Chronic obstructive pulmonary disease with (acute) exacerbation: Secondary | ICD-10-CM | POA: Diagnosis not present

## 2012-11-22 DIAGNOSIS — J449 Chronic obstructive pulmonary disease, unspecified: Secondary | ICD-10-CM | POA: Diagnosis not present

## 2012-11-27 ENCOUNTER — Ambulatory Visit (INDEPENDENT_AMBULATORY_CARE_PROVIDER_SITE_OTHER): Payer: Medicare Other | Admitting: Pharmacist Clinician (PhC)/ Clinical Pharmacy Specialist

## 2012-11-27 VITALS — BP 120/80 | HR 64

## 2012-11-27 DIAGNOSIS — I4891 Unspecified atrial fibrillation: Secondary | ICD-10-CM

## 2012-11-27 DIAGNOSIS — Z7901 Long term (current) use of anticoagulants: Secondary | ICD-10-CM | POA: Diagnosis not present

## 2012-11-27 LAB — POCT INR: INR: 4.9

## 2012-12-11 ENCOUNTER — Ambulatory Visit (INDEPENDENT_AMBULATORY_CARE_PROVIDER_SITE_OTHER): Payer: Medicare Other | Admitting: Pharmacist Clinician (PhC)/ Clinical Pharmacy Specialist

## 2012-12-11 VITALS — BP 110/76 | HR 60

## 2012-12-11 DIAGNOSIS — I4891 Unspecified atrial fibrillation: Secondary | ICD-10-CM | POA: Diagnosis not present

## 2012-12-11 DIAGNOSIS — Z7901 Long term (current) use of anticoagulants: Secondary | ICD-10-CM | POA: Diagnosis not present

## 2012-12-11 LAB — POCT INR: INR: 2.9

## 2012-12-12 ENCOUNTER — Telehealth: Payer: Self-pay | Admitting: Internal Medicine

## 2012-12-12 NOTE — Telephone Encounter (Signed)
I spoke with the pt and he states that he is to come in for an CXR and wanted to know does CY want to see him after. According to note pt is to have outpatient cxr and will be called with results, no appt needed at this time. Pt states understanding. Carron Curie, CMA

## 2012-12-12 NOTE — Telephone Encounter (Addendum)
Pt wants a call back asap "so he doesn't have to spend his whole day waiting on a call back". Pt wants to know if he will see dr young soon after having a cxr which he believes is to be tomorrow? Hazel Sams

## 2012-12-13 ENCOUNTER — Ambulatory Visit (INDEPENDENT_AMBULATORY_CARE_PROVIDER_SITE_OTHER)
Admission: RE | Admit: 2012-12-13 | Discharge: 2012-12-13 | Disposition: A | Discharge status: Home / Self Care | Payer: Medicare Other | Source: Ambulatory Visit | Attending: Internal Medicine | Admitting: Internal Medicine

## 2012-12-13 DIAGNOSIS — J449 Chronic obstructive pulmonary disease, unspecified: Secondary | ICD-10-CM | POA: Diagnosis not present

## 2012-12-13 DIAGNOSIS — R911 Solitary pulmonary nodule: Secondary | ICD-10-CM | POA: Diagnosis not present

## 2012-12-18 NOTE — Progress Notes (Signed)
Quick Note:  LMTCB ______ 

## 2012-12-19 ENCOUNTER — Telehealth: Payer: Self-pay | Admitting: Internal Medicine

## 2012-12-19 NOTE — Telephone Encounter (Signed)
Notes Recorded by Roberto Budge, MD on 12/13/2012 at 1:15 PM CXR- enlarged heart, old surgery changes, scarring and COPD. Nothing looks new or active. --  I spoke with patient about results and he verbalized understanding and had no questions

## 2013-01-07 ENCOUNTER — Encounter: Payer: Self-pay | Admitting: Cardiology

## 2013-01-08 ENCOUNTER — Encounter: Payer: Self-pay | Admitting: Cardiology

## 2013-01-08 ENCOUNTER — Ambulatory Visit: Payer: Medicare Other | Admitting: Pharmacist Clinician (PhC)/ Clinical Pharmacy Specialist

## 2013-01-08 ENCOUNTER — Ambulatory Visit (INDEPENDENT_AMBULATORY_CARE_PROVIDER_SITE_OTHER): Payer: Medicare Other | Admitting: Cardiology

## 2013-01-08 ENCOUNTER — Ambulatory Visit (INDEPENDENT_AMBULATORY_CARE_PROVIDER_SITE_OTHER): Payer: Medicare Other | Admitting: Pharmacist Clinician (PhC)/ Clinical Pharmacy Specialist

## 2013-01-08 VITALS — BP 128/68 | HR 56 | Ht 72.0 in | Wt 234.0 lb

## 2013-01-08 DIAGNOSIS — E785 Hyperlipidemia, unspecified: Secondary | ICD-10-CM

## 2013-01-08 DIAGNOSIS — Z9189 Other specified personal risk factors, not elsewhere classified: Secondary | ICD-10-CM | POA: Diagnosis not present

## 2013-01-08 DIAGNOSIS — I4891 Unspecified atrial fibrillation: Secondary | ICD-10-CM | POA: Diagnosis not present

## 2013-01-08 DIAGNOSIS — E669 Obesity, unspecified: Secondary | ICD-10-CM

## 2013-01-08 DIAGNOSIS — Z7901 Long term (current) use of anticoagulants: Secondary | ICD-10-CM

## 2013-01-08 DIAGNOSIS — Z951 Presence of aortocoronary bypass graft: Secondary | ICD-10-CM

## 2013-01-08 DIAGNOSIS — I2510 Atherosclerotic heart disease of native coronary artery without angina pectoris: Secondary | ICD-10-CM | POA: Diagnosis not present

## 2013-01-08 DIAGNOSIS — J439 Emphysema, unspecified: Secondary | ICD-10-CM

## 2013-01-08 DIAGNOSIS — I480 Paroxysmal atrial fibrillation: Secondary | ICD-10-CM

## 2013-01-08 DIAGNOSIS — J449 Chronic obstructive pulmonary disease, unspecified: Secondary | ICD-10-CM

## 2013-01-08 DIAGNOSIS — Z9289 Personal history of other medical treatment: Secondary | ICD-10-CM

## 2013-01-08 DIAGNOSIS — J438 Other emphysema: Secondary | ICD-10-CM

## 2013-01-08 LAB — POCT INR: INR: 2.2

## 2013-01-08 NOTE — Patient Instructions (Addendum)
Will send information to  PRIMARY about changing one of medication   Your physician wants you to follow-up in 12 months Dr Herbie Baltimore. You will receive a reminder letter in the mail two months in advance. If you don't receive a letter, please call our office to schedule the follow-up appointment.

## 2013-01-18 ENCOUNTER — Encounter: Payer: Self-pay | Admitting: Cardiology

## 2013-01-18 DIAGNOSIS — E785 Hyperlipidemia, unspecified: Secondary | ICD-10-CM | POA: Insufficient documentation

## 2013-01-18 DIAGNOSIS — I480 Paroxysmal atrial fibrillation: Secondary | ICD-10-CM | POA: Insufficient documentation

## 2013-01-18 DIAGNOSIS — E669 Obesity, unspecified: Secondary | ICD-10-CM | POA: Insufficient documentation

## 2013-01-18 NOTE — Assessment & Plan Note (Addendum)
He remains relatively stable since his bypass surgery. At this point as I noted his last note. He declines any further evaluation with stress test or echocardiogram. He says his symptoms have not changed, and he does not want to back from the heart catheterization unless he has active chest pain or significant worsening dyspnea that he had at the time of his initial cardiac catheterization and CABG. His blood pressure is relatively well-controlled for has not been on any antihypertensives.  Plan: Continue treatment with aspirin and beta blocker complement of sotalol along with lipid control with simvastatin.

## 2013-01-18 NOTE — Assessment & Plan Note (Signed)
Chronic atrial fibrillation, rate controlled with sotalol and anticoagulation on warfarin being followed up here at [facility]. He does a history of bradycardia, and currently his heart rate in the 50s. No signs of chronotropic incompetence.

## 2013-01-18 NOTE — Progress Notes (Signed)
Patient ID: Renan Danese, male   DOB: Jan 29, 1934, 79 y.o.   MRN: [id number] PCP: Juline Patch, MD  Clinic Note: Chief Complaint  Patient presents with  . 7 month visit    edema always, no chest pain, always sob    HPI: Dimitry Holsworth is a 79 y.o. male with a PMH below who presents today for a 6 to seven-month followup. As you recall he is another very pleasant gentleman, who is a former patient of Dr. Caprice Kluver. She has a history of CABG in 2007 where he underwent catheterization for dyspnea on exertion. He also has long-standing oxygen requiring COPD.  Interval History: He presents today, feeling relatively well from a cardiac standpoint. He has his baseline exertional dyspnea but relatively stable. No chest tightness or pressure with rest or exertion. No resting dyspnea more than usual. He says recently he had much more swelling in his leg on the left side was more red and then the right. Currently now is in the 1+ edema, which is no more than usual. He still has some skin discoloration in his legs. The swelling is pretty much intermittent but is usually better. He denies any symptoms of PND or orthopnea. He denies any sensation of palpitations or any knowledgeable about his atrial fibrillation or not. He denies any TIA or amaurosis fugax symptoms or claudication symptoms. No melena, hematochezia or hematuria but he does occasionally notice some blood when he wipes. This is usually when he is constipated.  He continues to be proud of the fact that he is really monitor his dietary intake. He is now taking cooking classes. He actually is having a now due to much all things around the house for his wife. She's gets frustrated now because she is weak he in her knees of walker all the time. Because of this he is not as able eat out and about and walking and he would like to do. And therefore is closely monitoring his dietary intake.  Past Medical History  Diagnosis Date  . History of MI (myocardial  infarction) March 2007    Cath showed multivessel disease, referred for CABG  . CAD in native artery March 2007    Cath for dyspnea on exertion: 75% distal LM, 85% RI, 95% mid-distal Cx, in multiple RCA lesions with 95% distal. --> Referred for CABG  . S/P CABG x 24 July 2005    LIMA-LAD, SVG-OM, seq SVG-PDA -PLB  . Atrial fibrillation, chronic     Anticoagulated on warfarin. Stable -- post op  . H/O echocardiogram March 2007    Normal LV size and cavity appeared normal function. Grade 1 diastolic dysfunction. Limited study.  . Obesity (BMI 30-39.9)     One year ago weighed 265 pounds, (12/2012) -- now 234 pounds  . Dyslipidemia, goal LDL below 70   . COPD (chronic obstructive pulmonary disease) with emphysema 05/23/2007    Hosp 5/29-6/01/12- COPD exacerbation ONOX 12/08/10- desaturated to less than 88% for over an hour, qualifying for home O2 during sleep    . Hypoxia      chronic, on home O2  . History of pneumonia   . Seasonal allergies     Prior Cardiac Evaluation and Past Surgical History: Past Surgical History  Procedure Laterality Date  . Cataract extraction      Laser  . Rll resection for hamartoma    . Rul for hamartoma    . Coronary artery bypass graft    . Cardiac catheterization  03  28 2007    NORMAL LV FUNCTION/ ABDOMINAL AORTA STENOSIS,75%-85%. RIGHT FEMORAL ARTERY :CATHETERS USED A  4-FRENCH WITH A 4-FRENCH SHEATH   No Known Allergies  Current Outpatient Prescriptions  Medication Sig Dispense Refill  . albuterol (PROVENTIL HFA;VENTOLIN HFA) 108 (90 BASE) MCG/ACT inhaler Inhale 2 puffs into the lungs every 6 (six) hours as needed for wheezing or shortness of breath.  1 Inhaler  prn  . aspirin 81 MG tablet Take 81 mg by mouth daily.        . budesonide-formoterol (SYMBICORT) 160-4.5 MCG/ACT inhaler Inhale 2 puffs into the lungs 2 (two) times daily.  3 Inhaler  3  . cholecalciferol (VITAMIN D) 1000 UNITS tablet Take 1,000 Units by mouth daily.      . furosemide  (LASIX) 40 MG tablet Take 40 mg by mouth daily.      Marland Kitchen ipratropium-albuterol (DUONEB) 0.5-2.5 (3) MG/3ML SOLN Take 3 mLs by nebulization every 6 (six) hours as needed.        . potassium chloride SA (K-DUR,KLOR-CON) 20 MEQ tablet TAKE ONE TABLET BY MOUTH EVERY MORNING  30 tablet  5  . simvastatin (ZOCOR) 40 MG tablet Take 40 mg by mouth every evening.      Marland Kitchen SOTALOL HCL, AF, PO Take 40 mg by mouth daily.      Marland Kitchen warfarin (COUMADIN) 5 MG tablet Take 5 mg by mouth daily. As directed       . zolpidem (AMBIEN) 10 MG tablet 1/2 tablet at bedtime       No current facility-administered medications for this visit.    History   Social History Narrative   He is a married father of 2, grandfather of 62, does not get routine exercise. He is a former smoker, but does not currently or not drink alcohol.    He is modifying his diet and trying to get activity but is doing better with the diet than the activity.    He is under a lot of stress. His wife who has been pretty much the caregiver for has been dealing with TIAs and other kind of issues that have been quite stressful for her. She also has back problems,   And coronary disease, uses a rolling walker and now has had a TIA and has been in and out of the hospital, and it seems to be almost overwhelming for him.    ROS: A comprehensive Review of Systems - Negative except Pertinent positives above. Other noncardiac issues below. Gastrointestinal ROS: positive for - constipation and gas/bloating negative for - blood in stools, diarrhea, melena, nausea/vomiting or stool incontinence Dermatological ROS: positive for skin lesion changes and The left leg being somewhat red and discolored.  PHYSICAL EXAM BP 128/68  Pulse 56  Ht 6' (1.829 m)  Wt 234 lb (106.142 kg)  BMI 31.73 kg/m2 General appearance: alert, cooperative, appears stated age, no distress, moderately obese and ootherwise healthy appearing. Notably less obese than before. Answers questions  appropriately. Pleasant mood and affect. Neck: no adenopathy, no carotid bruit, supple, symmetrical, trachea midline and Still unable to see jugular veins due to body habitus. Lungs: Prolonged expiration phase almost 2-1. Mild interstitial sounds throughout with end expiratory wheezing throughout. No rhonchi or rales. No dullness to percussion. Heart: regular rate and rhythm, S1, S2 normal, S3 present and No rubs or gallops noted. Very distant heart sounds. Unable to palpate PMI Abdomen: soft, non-tender; bowel sounds normal; no masses,  no organomegaly and Still has moderate truncal obesity although  his overall weight puts him below this category. Extremities: edema 1-2+ on the left and 1+ in the right, varicose veins noted and venous stasis dermatitis noted Pulses: 2+ and symmetric Skin: hyperpigmentation - lower leg(s) Bilaterally left greater than right, varicosities - lower leg(s) bilateral and Also noted are telangiectasias/spider veins Neurologic: Grossly normal  [id number] today: Yes Rate: 56 , Rhythm: Atrial fibrillation, right bundle branch block which appeared to be new, otherwise normal  Recent Labs: April 2014: Total cholesterol 202, to try to 35, HDL 35, LDL 120  ASSESSMENT / PLAN: CAD in native artery - LM, RCA, RI & Cx disease --> CABG x 4 (LIMA-LAD, SVG-OM/RI, SVG-rPDA-rPL) He remains relatively stable since his bypass surgery. At this point as I noted his last note. He declines any further evaluation with stress test or echocardiogram. He says his symptoms have not changed, and he does not want to back from the heart catheterization unless he has active chest pain or significant worsening dyspnea that he had at the time of his initial cardiac catheterization and CABG. His blood pressure is relatively well-controlled for has not been on any antihypertensives.  Plan: Continue treatment with aspirin and beta blocker complement of sotalol along with lipid control with  simvastatin.  Atrial fibrillation, chronic / persistent Chronic atrial fibrillation, rate controlled with sotalol and anticoagulation on warfarin being followed up here at [facility]. He does a history of bradycardia, and currently his heart rate in the 50s. No signs of chronotropic incompetence.  Obesity (BMI 30-39.9) He continues to present with his dietary modification. He is now dropped 31 pounds since last year, which was essentially the high and his target. He intends to continue to lose down to get under 200 pounds if possible. The distal to the back and doing more active exercise activity, but continues to be limited by having to be the sole caregiver for his wife.  Dyslipidemia, goal LDL below 70 Despite his weight loss, he continues to have poorly controlled lipids. We may yet this point where we switch him from simvastatin to a more potent statin such as Crestor or Lipitor and consider adding Zetia. They're being monitored by his primary physician. Not sure what changes are in the works.  COPD (chronic obstructive pulmonary disease) with emphysema Followed by Dr. Maple Hudson.  Long term (current) use of anticoagulants Warfarin being monitored at [facility] by [facility] Hay.   Orders Placed This Encounter  Procedures  . EKG 12-Lead   Meds refilled this encounter  Medications  . SOTALOL HCL, AF, PO    Sig: Take 40 mg by mouth daily.    Followup: 12 months, unless symptoms worsen.  Raul Torrance W. Herbie Baltimore, M.D., M.S. [facility] [address] [place], [place]  [address]  [phone] Pager # [phone]

## 2013-01-18 NOTE — Assessment & Plan Note (Signed)
Warfarin being monitored at [facility] by Phillips Hay.

## 2013-01-18 NOTE — Assessment & Plan Note (Signed)
Followed by Dr. Young. 

## 2013-01-18 NOTE — Assessment & Plan Note (Signed)
He continues to present with his dietary modification. He is now dropped 31 pounds since last year, which was essentially the high and his target. He intends to continue to lose down to get under 200 pounds if possible. The distal to the back and doing more active exercise activity, but continues to be limited by having to be the sole caregiver for his wife.

## 2013-01-18 NOTE — Assessment & Plan Note (Signed)
Despite his weight loss, he continues to have poorly controlled lipids. We may yet this point where we switch him from simvastatin to a more potent statin such as Crestor or Lipitor and consider adding Zetia. They're being monitored by his primary physician. Not sure what changes are in the works.

## 2013-02-19 ENCOUNTER — Telehealth: Payer: Self-pay | Admitting: Pharmacist Clinician (PhC)/ Clinical Pharmacy Specialist

## 2013-02-19 ENCOUNTER — Ambulatory Visit (INDEPENDENT_AMBULATORY_CARE_PROVIDER_SITE_OTHER): Payer: Medicare Other | Admitting: Pharmacist Clinician (PhC)/ Clinical Pharmacy Specialist

## 2013-02-19 VITALS — BP 138/68 | HR 76

## 2013-02-19 DIAGNOSIS — Z7901 Long term (current) use of anticoagulants: Secondary | ICD-10-CM

## 2013-02-19 DIAGNOSIS — I4891 Unspecified atrial fibrillation: Secondary | ICD-10-CM

## 2013-02-19 LAB — POCT INR: INR: 1.8

## 2013-02-19 MED ORDER — WARFARIN SODIUM 2.5 MG PO TABS: 2.5000 mg | ORAL_TABLET | Freq: Every day | ORAL | Status: DC

## 2013-02-19 NOTE — Telephone Encounter (Signed)
Needs to speak with you

## 2013-03-12 ENCOUNTER — Ambulatory Visit (INDEPENDENT_AMBULATORY_CARE_PROVIDER_SITE_OTHER): Payer: Medicare Other | Admitting: Internal Medicine

## 2013-03-12 ENCOUNTER — Encounter: Payer: Self-pay | Admitting: Internal Medicine

## 2013-03-12 ENCOUNTER — Ambulatory Visit (INDEPENDENT_AMBULATORY_CARE_PROVIDER_SITE_OTHER)
Admission: RE | Admit: 2013-03-12 | Discharge: 2013-03-12 | Disposition: A | Discharge status: Home / Self Care | Payer: Medicare Other | Source: Ambulatory Visit | Attending: Internal Medicine | Admitting: Internal Medicine

## 2013-03-12 VITALS — BP 130/72 | HR 56 | Ht 72.0 in | Wt 235.2 lb

## 2013-03-12 DIAGNOSIS — J449 Chronic obstructive pulmonary disease, unspecified: Secondary | ICD-10-CM

## 2013-03-12 DIAGNOSIS — J439 Emphysema, unspecified: Secondary | ICD-10-CM

## 2013-03-12 DIAGNOSIS — Z23 Encounter for immunization: Secondary | ICD-10-CM | POA: Diagnosis not present

## 2013-03-12 DIAGNOSIS — J4489 Other specified chronic obstructive pulmonary disease: Secondary | ICD-10-CM

## 2013-03-12 DIAGNOSIS — F4321 Adjustment disorder with depressed mood: Secondary | ICD-10-CM | POA: Diagnosis not present

## 2013-03-12 DIAGNOSIS — J438 Other emphysema: Secondary | ICD-10-CM | POA: Diagnosis not present

## 2013-03-12 DIAGNOSIS — F329 Major depressive disorder, single episode, unspecified: Secondary | ICD-10-CM

## 2013-03-12 DIAGNOSIS — R05 Cough: Secondary | ICD-10-CM | POA: Diagnosis not present

## 2013-03-12 MED ORDER — FLUTICASONE FUROATE-VILANTEROL 100-25 MCG/INH IN AEPB: 1.0000 | INHALATION_SPRAY | Freq: Every day | RESPIRATORY_TRACT | Status: DC

## 2013-03-12 NOTE — Patient Instructions (Signed)
2 Samples Breo Ellipta   1 puff then rinse mouth, ONCE Daily EVERY DAY     Try this instead of Symbicort. When the samples run out, return to Symbicort   Order- CXR dx COPD  Pneumonia vax Prevnar conjugate 13

## 2013-03-12 NOTE — Progress Notes (Signed)
Patient ID: Roberto Reilly, male    DOB: 09/01/1933, 79 y.o.   MRN: [id number]  HPI 12/06/10- [age] yoM former smoker, followed for COPD, complicated by CAD, diastolic heart failure, PAFib/chronic anticoagulation, HBP, Obesity Last here January 11, 2010. - Note reviewed. He was hosp for COPD exacerbation, diastolic heart failure, chronic respiratory failure  5/29-10/22/10 , but says he is back to baseline now.  He blames hot humid weather currently for cough with chest rattle with clear to trace yellow sputum. He feels well controlled now. Denies smoking. Dr [name] added Symbicort ? Strength. It may be helping some. Using ventolin rescue inhaler 0-2x/day. Triggers- weather, indoors to outdoors. Continues Spiriva. He is wearing a heart monitor for Dr Clarene DukeLittle, potential need for pacemaker.   01/06/11-77 yoM former smoker, followed for COPD, complicated by CAD, diastolic heart failure, PAFib/chronic anticoagulation, HBP, Obesity Blames occasional bad day on "marital stress". Feels he can't leave wife long enough to have pacemaker placed.  Good and bad days with breathing are affected by the weather.  He had home O2 after hospital- did ONOX- desat < 88% for over an hour, meeting qualifying criteria for home O2 for sleep- discussed.  He again says he is not interested in having a sleep study and would not choose to treat sleep apnea if he found he had it.   09/01/11- 77 yoM former smoker, followed for COPD, complicated by CAD, diastolic heart failure, PAFib/chronic anticoagulation, HBP, Obesity, old right thoracotomy Wife fell and broke her right arm so he has to do the housework and help her which is very depressing and confining. Breathing has been stable except he has to use his nebulizer at night. Gets up repeatedly for wife and he is tired and worn out. Little cough with scant clear phlegm. Did not qualify for help with Spiriva cost. Our office is looking at that. He did qualify for Xopenex. Using Symbicort and  nebulizer with no new heart issues since last here.  03/08/12- 77 yoM former smoker, followed for COPD, complicated by CAD, diastolic heart failure, PAFib/chronic anticoagulation, HBP, Obesity, old right thoracotomy Increased SOB with activity; denies any wheezing, cough, or congestion. COPD Assessment Test (CAT) score 14/40 Can't afford Spiriva and doesn't qualify for assistance. Asks samples. Occasional cough with clear phlegm, no wheeze, chest pain or blood.  Uses O2 at night most nights. We discussed O2 therapy. CXR 09/01/11 IMPRESSION:  Again noted mild hyperinflation. Stable chronic blunting of the  right costophrenic angle and mild elevation of the right  hemidiaphragm. This may be due to pleural thickening, scarring or  small right pleural effusion. No acute infiltrate or pulmonary  edema. Question old right lower healed rib fractures.  Original Report Authenticated By: Natasha MeadLIVIU POP, M.D.   09/06/12- [age] yoM former smoker, followed for COPD, complicated by CAD, diastolic heart failure, PAFib/chronic anticoagulation, HBP, Obesity, old right thoracotomy FOLLOWS FOR: Breathing is unchanged. Reports DOE and a dry cough from time to time. Denies chest pain or chest tightness. He is still using a leftover Xopenex, but has prescription for albuterol. Hopefully that won't bother his atrial fibrillation.  03/12/13- 79 yoM former smoker, followed for COPD, complicated by CAD, diastolic heart failure, PAFib/chronic anticoagulation, HBP, Obesity, old right thoracotomy FOLLOWS FOR: has noticed he is needing his O2 more each day.  Wife is in and out of the hospital, requiring a lot of his energy and time. He blames low oxygen saturation on arrival today on coming across the parking lot and being  cold. Says he can't afford rescue inhaler. Cough is only productive after his nebulizer-white with no blood.  Review of Systems- see HPI Constitutional:   No-   weight loss, night sweats, fevers, chills,  fatigue, lassitude. HEENT:   No-   headaches, difficulty swallowing, tooth/dental problems, sore throat,                  No-   sneezing, itching, ear ache, nasal congestion, post nasal drip,  CV:  No-   chest pain, orthopnea, PND, swelling in lower extremities, anasarca, dizziness, palpitations GI:  No-   heartburn, indigestion, abdominal pain, nausea, vomiting,  Resp: , per HPI              No-  coughing up of blood.              No-   change in color of mucus.  +occasional wheezing.   Skin: No-   rash or lesions. GU: . MS:  No-   joint pain or swelling.   Psych:  + change in mood or affect. + depression or anxiety.  No memory loss.   Objective:   Physical Exam General- Alert, Oriented, Affect-appropriate, Distress- none acute   Overweight. Placed on O2 2L here Skin- rash-none, lesions- none, excoriation- none. + coumadin bruising Lymphadenopathy- none Head- atraumatic            Eyes- Gross vision intact, PERRLA, conjunctivae clear secretions            Ears- Hearing, canals            Nose- Clear, No-Septal dev, mucus, polyps, erosion, perforation             Throat- Mallampati II , mucosa clear , drainage- none, tonsils- atrophic    hoarse Neck- flexible , trachea midline, no stridor , thyroid nl, carotid no bruit Chest - symmetrical excursion , unlabored           Heart/CV- RRR( no pacer) , no murmur , no gallop  , no rub, nl s1 s2                           - JVD- none , edema- none, stasis changes- none, varices- none           Lung- +mild rattle and wheeze , no- cough, dullness-none, rub- none   unlabored           Chest wall- sternal scar Abd-  Br/ Gen/ Rectal- Not done, not indicated Extrem- + external varices on legs- soft with negative Homans Neuro- grossly intact to observation

## 2013-03-13 NOTE — Progress Notes (Signed)
Quick Note:  Advised pt of CXR results per CY. Pt verbalized understanding and has no further questions ______

## 2013-03-15 DIAGNOSIS — I2510 Atherosclerotic heart disease of native coronary artery without angina pectoris: Secondary | ICD-10-CM | POA: Diagnosis not present

## 2013-03-15 DIAGNOSIS — I10 Essential (primary) hypertension: Secondary | ICD-10-CM | POA: Diagnosis not present

## 2013-03-15 DIAGNOSIS — E559 Vitamin D deficiency, unspecified: Secondary | ICD-10-CM | POA: Diagnosis not present

## 2013-03-19 ENCOUNTER — Ambulatory Visit: Payer: Medicare Other | Admitting: Pharmacist Clinician (PhC)/ Clinical Pharmacy Specialist

## 2013-03-19 DIAGNOSIS — H6120 Impacted cerumen, unspecified ear: Secondary | ICD-10-CM | POA: Diagnosis not present

## 2013-03-19 DIAGNOSIS — G4700 Insomnia, unspecified: Secondary | ICD-10-CM | POA: Diagnosis not present

## 2013-03-19 DIAGNOSIS — J449 Chronic obstructive pulmonary disease, unspecified: Secondary | ICD-10-CM | POA: Diagnosis not present

## 2013-03-19 DIAGNOSIS — Z23 Encounter for immunization: Secondary | ICD-10-CM | POA: Diagnosis not present

## 2013-03-19 DIAGNOSIS — Z9181 History of falling: Secondary | ICD-10-CM | POA: Diagnosis not present

## 2013-03-19 DIAGNOSIS — Z1331 Encounter for screening for depression: Secondary | ICD-10-CM | POA: Diagnosis not present

## 2013-03-19 DIAGNOSIS — E785 Hyperlipidemia, unspecified: Secondary | ICD-10-CM | POA: Diagnosis not present

## 2013-03-20 ENCOUNTER — Ambulatory Visit (INDEPENDENT_AMBULATORY_CARE_PROVIDER_SITE_OTHER): Payer: Medicare Other | Admitting: Pharmacist Clinician (PhC)/ Clinical Pharmacy Specialist

## 2013-03-20 VITALS — BP 120/64 | HR 56

## 2013-03-20 DIAGNOSIS — Z7901 Long term (current) use of anticoagulants: Secondary | ICD-10-CM

## 2013-03-20 DIAGNOSIS — I4891 Unspecified atrial fibrillation: Secondary | ICD-10-CM | POA: Diagnosis not present

## 2013-03-29 ENCOUNTER — Encounter: Payer: Self-pay | Admitting: Internal Medicine

## 2013-03-29 NOTE — Assessment & Plan Note (Signed)
Dealing with his wife's chronic illness and limited resources.

## 2013-03-29 NOTE — Assessment & Plan Note (Signed)
Severe emphysema is expected to deteriorate slowly Plan- CXR, Prevnar pneumococcal vaccine

## 2013-04-15 DIAGNOSIS — T7840XA Allergy, unspecified, initial encounter: Secondary | ICD-10-CM | POA: Diagnosis not present

## 2013-04-17 ENCOUNTER — Ambulatory Visit (INDEPENDENT_AMBULATORY_CARE_PROVIDER_SITE_OTHER): Payer: Medicare Other | Admitting: Pharmacist Clinician (PhC)/ Clinical Pharmacy Specialist

## 2013-04-17 VITALS — BP 140/84 | HR 60

## 2013-04-17 DIAGNOSIS — I4891 Unspecified atrial fibrillation: Secondary | ICD-10-CM | POA: Diagnosis not present

## 2013-04-17 DIAGNOSIS — Z7901 Long term (current) use of anticoagulants: Secondary | ICD-10-CM

## 2013-04-17 LAB — POCT INR: INR: 2.7

## 2013-04-22 DIAGNOSIS — Z2089 Contact with and (suspected) exposure to other communicable diseases: Secondary | ICD-10-CM | POA: Diagnosis not present

## 2013-04-22 DIAGNOSIS — L509 Urticaria, unspecified: Secondary | ICD-10-CM | POA: Diagnosis not present

## 2013-04-29 ENCOUNTER — Telehealth: Payer: Self-pay | Admitting: Pharmacist Clinician (PhC)/ Clinical Pharmacy Specialist

## 2013-04-29 ENCOUNTER — Inpatient Hospital Stay (HOSPITAL_COMMUNITY)
Admission: EM | Admit: 2013-04-29 | Discharge: 2013-05-01 | DRG: 190 | Disposition: A | Discharge status: Home / Self Care | Payer: Medicare Other | Attending: Internal Medicine | Admitting: Internal Medicine

## 2013-04-29 ENCOUNTER — Encounter (HOSPITAL_COMMUNITY): Payer: Self-pay | Admitting: Emergency Medicine

## 2013-04-29 ENCOUNTER — Emergency Department (HOSPITAL_COMMUNITY): Payer: Medicare Other

## 2013-04-29 DIAGNOSIS — I10 Essential (primary) hypertension: Secondary | ICD-10-CM | POA: Diagnosis present

## 2013-04-29 DIAGNOSIS — F3289 Other specified depressive episodes: Secondary | ICD-10-CM | POA: Diagnosis present

## 2013-04-29 DIAGNOSIS — F329 Major depressive disorder, single episode, unspecified: Secondary | ICD-10-CM

## 2013-04-29 DIAGNOSIS — J40 Bronchitis, not specified as acute or chronic: Secondary | ICD-10-CM

## 2013-04-29 DIAGNOSIS — Z8249 Family history of ischemic heart disease and other diseases of the circulatory system: Secondary | ICD-10-CM

## 2013-04-29 DIAGNOSIS — I252 Old myocardial infarction: Secondary | ICD-10-CM | POA: Diagnosis not present

## 2013-04-29 DIAGNOSIS — Z902 Acquired absence of lung [part of]: Secondary | ICD-10-CM | POA: Diagnosis not present

## 2013-04-29 DIAGNOSIS — I2510 Atherosclerotic heart disease of native coronary artery without angina pectoris: Secondary | ICD-10-CM | POA: Diagnosis present

## 2013-04-29 DIAGNOSIS — F411 Generalized anxiety disorder: Secondary | ICD-10-CM | POA: Diagnosis present

## 2013-04-29 DIAGNOSIS — E785 Hyperlipidemia, unspecified: Secondary | ICD-10-CM | POA: Diagnosis not present

## 2013-04-29 DIAGNOSIS — Z7982 Long term (current) use of aspirin: Secondary | ICD-10-CM | POA: Diagnosis not present

## 2013-04-29 DIAGNOSIS — Z6829 Body mass index (BMI) 29.0-29.9, adult: Secondary | ICD-10-CM

## 2013-04-29 DIAGNOSIS — Z951 Presence of aortocoronary bypass graft: Secondary | ICD-10-CM

## 2013-04-29 DIAGNOSIS — Z87891 Personal history of nicotine dependence: Secondary | ICD-10-CM

## 2013-04-29 DIAGNOSIS — Z8619 Personal history of other infectious and parasitic diseases: Secondary | ICD-10-CM | POA: Diagnosis present

## 2013-04-29 DIAGNOSIS — I4891 Unspecified atrial fibrillation: Secondary | ICD-10-CM | POA: Diagnosis not present

## 2013-04-29 DIAGNOSIS — Z8701 Personal history of pneumonia (recurrent): Secondary | ICD-10-CM | POA: Diagnosis not present

## 2013-04-29 DIAGNOSIS — I480 Paroxysmal atrial fibrillation: Secondary | ICD-10-CM | POA: Diagnosis present

## 2013-04-29 DIAGNOSIS — J439 Emphysema, unspecified: Secondary | ICD-10-CM | POA: Diagnosis present

## 2013-04-29 DIAGNOSIS — Z9849 Cataract extraction status, unspecified eye: Secondary | ICD-10-CM

## 2013-04-29 DIAGNOSIS — R6883 Chills (without fever): Secondary | ICD-10-CM | POA: Diagnosis present

## 2013-04-29 DIAGNOSIS — I482 Chronic atrial fibrillation, unspecified: Secondary | ICD-10-CM

## 2013-04-29 DIAGNOSIS — J441 Chronic obstructive pulmonary disease with (acute) exacerbation: Principal | ICD-10-CM | POA: Diagnosis present

## 2013-04-29 DIAGNOSIS — J9620 Acute and chronic respiratory failure, unspecified whether with hypoxia or hypercapnia: Secondary | ICD-10-CM | POA: Diagnosis present

## 2013-04-29 DIAGNOSIS — R0902 Hypoxemia: Secondary | ICD-10-CM | POA: Diagnosis present

## 2013-04-29 DIAGNOSIS — Z9981 Dependence on supplemental oxygen: Secondary | ICD-10-CM | POA: Diagnosis not present

## 2013-04-29 DIAGNOSIS — J209 Acute bronchitis, unspecified: Secondary | ICD-10-CM | POA: Diagnosis not present

## 2013-04-29 DIAGNOSIS — Z7901 Long term (current) use of anticoagulants: Secondary | ICD-10-CM | POA: Diagnosis not present

## 2013-04-29 DIAGNOSIS — R0602 Shortness of breath: Secondary | ICD-10-CM | POA: Diagnosis not present

## 2013-04-29 DIAGNOSIS — R05 Cough: Secondary | ICD-10-CM | POA: Diagnosis not present

## 2013-04-29 DIAGNOSIS — R059 Cough, unspecified: Secondary | ICD-10-CM | POA: Diagnosis not present

## 2013-04-29 DIAGNOSIS — E669 Obesity, unspecified: Secondary | ICD-10-CM | POA: Diagnosis present

## 2013-04-29 DIAGNOSIS — J9600 Acute respiratory failure, unspecified whether with hypoxia or hypercapnia: Secondary | ICD-10-CM | POA: Diagnosis not present

## 2013-04-29 DIAGNOSIS — L299 Pruritus, unspecified: Secondary | ICD-10-CM | POA: Diagnosis present

## 2013-04-29 DIAGNOSIS — I498 Other specified cardiac arrhythmias: Secondary | ICD-10-CM | POA: Diagnosis present

## 2013-04-29 DIAGNOSIS — R5381 Other malaise: Secondary | ICD-10-CM | POA: Diagnosis not present

## 2013-04-29 LAB — CBC WITH DIFFERENTIAL/PLATELET
Basophils Absolute: 0 10*3/uL (ref 0.0–0.1)
Basophils Relative: 0 % (ref 0–1)
Hemoglobin: 17.7 g/dL — ABNORMAL HIGH (ref 13.0–17.0)
Lymphocytes Relative: 16 % (ref 12–46)
MCHC: 34.2 g/dL (ref 30.0–36.0)
MCV: 88.4 fL (ref 78.0–100.0)
Monocytes Absolute: 0.9 10*3/uL (ref 0.1–1.0)
Monocytes Relative: 7 % (ref 3–12)
Neutro Abs: 9.4 10*3/uL — ABNORMAL HIGH (ref 1.7–7.7)
Neutrophils Relative %: 76 % (ref 43–77)
WBC: 12.4 10*3/uL — ABNORMAL HIGH (ref 4.0–10.5)

## 2013-04-29 LAB — BASIC METABOLIC PANEL
BUN: 31 mg/dL — ABNORMAL HIGH (ref 6–23)
CO2: 33 mEq/L — ABNORMAL HIGH (ref 19–32)
Calcium: 9.5 mg/dL (ref 8.4–10.5)
Chloride: 95 mEq/L — ABNORMAL LOW (ref 96–112)
Creatinine, Ser: 1.2 mg/dL (ref 0.50–1.35)
GFR calc Af Amer: 66 mL/min — ABNORMAL LOW (ref >90)
Glucose, Bld: 108 mg/dL — ABNORMAL HIGH (ref 70–99)
Potassium: 4.2 mEq/L (ref 3.5–5.1)

## 2013-04-29 LAB — PROTIME-INR: Prothrombin Time: 29.3 seconds — ABNORMAL HIGH (ref 11.6–15.2)

## 2013-04-29 LAB — PRO B NATRIURETIC PEPTIDE: Pro B Natriuretic peptide (BNP): 445.2 pg/mL (ref 0–450)

## 2013-04-29 LAB — MAGNESIUM: Magnesium: 2.5 mg/dL (ref 1.5–2.5)

## 2013-04-29 MED ORDER — DM-GUAIFENESIN ER 30-600 MG PO TB12
1.0000 | ORAL_TABLET | Freq: Two times a day (BID) | ORAL | Status: DC
  Administered 2013-04-29 – 2013-05-01 (×4): 1 via ORAL
  Filled 2013-04-29 (×5): qty 1

## 2013-04-29 MED ORDER — ENOXAPARIN SODIUM 40 MG/0.4ML ~~LOC~~ SOLN: 40.0000 mg | SUBCUTANEOUS | Status: DC

## 2013-04-29 MED ORDER — ACETAMINOPHEN 325 MG PO TABS
650.0000 mg | ORAL_TABLET | Freq: Four times a day (QID) | ORAL | Status: DC | PRN
Start: 2013-04-29 — End: 2013-05-01

## 2013-04-29 MED ORDER — ACETAMINOPHEN 650 MG RE SUPP: 650.0000 mg | Freq: Four times a day (QID) | RECTAL | Status: DC | PRN

## 2013-04-29 MED ORDER — CLOBETASOL PROPIONATE 0.05 % EX CREA
TOPICAL_CREAM | Freq: Every day | CUTANEOUS | Status: DC
  Administered 2013-04-30: 11:00:00 via TOPICAL
  Filled 2013-04-29: qty 15

## 2013-04-29 MED ORDER — LEVOFLOXACIN 500 MG PO TABS
500.0000 mg | ORAL_TABLET | Freq: Once | ORAL | Status: AC
  Administered 2013-04-29: 500 mg via ORAL
  Filled 2013-04-29: qty 1

## 2013-04-29 MED ORDER — FUROSEMIDE 40 MG PO TABS
40.0000 mg | ORAL_TABLET | Freq: Every day | ORAL | Status: DC
  Administered 2013-04-30 – 2013-05-01 (×2): 40 mg via ORAL
  Filled 2013-04-29 (×2): qty 1

## 2013-04-29 MED ORDER — ALBUTEROL SULFATE (5 MG/ML) 0.5% IN NEBU
5.0000 mg | INHALATION_SOLUTION | Freq: Once | RESPIRATORY_TRACT | Status: AC
  Administered 2013-04-29: 5 mg via RESPIRATORY_TRACT
  Filled 2013-04-29: qty 1

## 2013-04-29 MED ORDER — PERMETHRIN 5 % EX CREA
1.0000 "application " | TOPICAL_CREAM | Freq: Two times a day (BID) | CUTANEOUS | Status: DC
  Filled 2013-04-29: qty 60

## 2013-04-29 MED ORDER — IPRATROPIUM BROMIDE 0.02 % IN SOLN
0.5000 mg | Freq: Three times a day (TID) | RESPIRATORY_TRACT | Status: DC
  Administered 2013-04-29 – 2013-05-01 (×5): 0.5 mg via RESPIRATORY_TRACT
  Filled 2013-04-29 (×5): qty 2.5

## 2013-04-29 MED ORDER — SODIUM CHLORIDE 0.9 % IV SOLN
INTRAVENOUS | Status: DC
  Administered 2013-04-29 – 2013-04-30 (×2): via INTRAVENOUS
  Administered 2013-04-30: 20 mL/h via INTRAVENOUS

## 2013-04-29 MED ORDER — POTASSIUM CHLORIDE CRYS ER 20 MEQ PO TBCR
20.0000 meq | EXTENDED_RELEASE_TABLET | Freq: Every day | ORAL | Status: DC
  Filled 2013-04-29: qty 1

## 2013-04-29 MED ORDER — IPRATROPIUM BROMIDE 0.02 % IN SOLN
0.5000 mg | Freq: Once | RESPIRATORY_TRACT | Status: AC
  Administered 2013-04-29: 0.5 mg via RESPIRATORY_TRACT
  Filled 2013-04-29: qty 2.5

## 2013-04-29 MED ORDER — LEVOFLOXACIN 500 MG PO TABS
500.0000 mg | ORAL_TABLET | Freq: Every day | ORAL | Status: DC
  Administered 2013-04-30: 500 mg via ORAL
  Filled 2013-04-29: qty 1

## 2013-04-29 MED ORDER — PREDNISONE 50 MG PO TABS
60.0000 mg | ORAL_TABLET | Freq: Every day | ORAL | Status: DC
  Filled 2013-04-29: qty 1

## 2013-04-29 MED ORDER — ALBUTEROL SULFATE (5 MG/ML) 0.5% IN NEBU
2.5000 mg | INHALATION_SOLUTION | Freq: Three times a day (TID) | RESPIRATORY_TRACT | Status: DC
  Administered 2013-04-29 – 2013-05-01 (×5): 2.5 mg via RESPIRATORY_TRACT
  Filled 2013-04-29 (×5): qty 0.5

## 2013-04-29 MED ORDER — BETAMETHASONE DIPROPIONATE 0.05 % EX CREA: 1.0000 "application " | TOPICAL_CREAM | Freq: Every day | CUTANEOUS | Status: DC

## 2013-04-29 MED ORDER — ASPIRIN EC 81 MG PO TBEC
81.0000 mg | DELAYED_RELEASE_TABLET | Freq: Every day | ORAL | Status: DC
  Administered 2013-04-30 – 2013-05-01 (×2): 81 mg via ORAL
  Filled 2013-04-29 (×3): qty 1

## 2013-04-29 MED ORDER — MORPHINE SULFATE 2 MG/ML IJ SOLN: 1.0000 mg | INTRAMUSCULAR | Status: DC | PRN

## 2013-04-29 MED ORDER — PREDNISONE 20 MG PO TABS
60.0000 mg | ORAL_TABLET | Freq: Once | ORAL | Status: AC
  Administered 2013-04-29: 60 mg via ORAL
  Filled 2013-04-29: qty 3

## 2013-04-29 MED ORDER — WARFARIN - PHARMACIST DOSING INPATIENT: Freq: Every day | Status: DC

## 2013-04-29 MED ORDER — HYDROXYZINE HCL 25 MG PO TABS
25.0000 mg | ORAL_TABLET | Freq: Two times a day (BID) | ORAL | Status: DC
  Filled 2013-04-29 (×3): qty 1

## 2013-04-29 MED ORDER — METHYLPREDNISOLONE SODIUM SUCC 125 MG IJ SOLR
80.0000 mg | INTRAMUSCULAR | Status: DC
  Administered 2013-04-29: 80 mg via INTRAVENOUS
  Filled 2013-04-29 (×2): qty 1.28

## 2013-04-29 MED ORDER — SOTALOL HCL 80 MG PO TABS
80.0000 mg | ORAL_TABLET | Freq: Every day | ORAL | Status: DC
  Filled 2013-04-29: qty 1

## 2013-04-29 MED ORDER — VITAMIN D3 25 MCG (1000 UNIT) PO TABS
1000.0000 [IU] | ORAL_TABLET | Freq: Every day | ORAL | Status: DC
  Administered 2013-04-30 – 2013-05-01 (×2): 1000 [IU] via ORAL
  Filled 2013-04-29 (×2): qty 1

## 2013-04-29 MED ORDER — WARFARIN SODIUM 1 MG PO TABS
1.0000 mg | ORAL_TABLET | Freq: Once | ORAL | Status: DC
  Filled 2013-04-29: qty 1

## 2013-04-29 MED ORDER — SIMVASTATIN 40 MG PO TABS
40.0000 mg | ORAL_TABLET | Freq: Every evening | ORAL | Status: DC
  Filled 2013-04-29 (×2): qty 1

## 2013-04-29 NOTE — H&P (Signed)
[facility] History and Physical  Drevin Ortner ONG:[id number] DOB: 09/04/1933 DOA: 04/29/2013  Referring physician:  PCP: Juline Patch, MD  Specialists:   Chief Complaint: SOB,  HPI: Roberto Reilly is a 79 y.o. male PMHx COPD with emphysema, atrial fibrillation on chronic anticoagulant (no cardiologist), CABG x4, HLD, scabies (last treated 2 weeks ago).S/P RLL lobectomy approximately 2000 I. Dr. Parks Ranger at [facility] (secondary to unknown malignancy). Date starting last Wednesday began to have increasing shortness of breath NOTE patient on home O2 at 2 L via [place] unable to get appt w/ Dr Ricki Miller until today. He is reported positive increased SOB, positive productive cough (green), negative fever, positive chills.   Denies hemoptysis. No abdominal pain. No chest pain. Positive DOE.  Attempt his inhalers at home without improvement in his symptoms. Dr. Ricki Miller today patient with 2 nebulized treatments without improvement in his symptoms. Since the ER for evaluation. Patient reports continuing mild to moderate ongoing shortness of breath. Currently on 2 L O2 via  and while sitting still feels negative SOB.    Review of Systems: The patient denies anorexia, fever, weight loss,, vision loss, decreased hearing, hoarseness, chest pain, syncope, peripheral edema, balance deficits, hemoptysis, abdominal pain, melena, hematochezia, severe indigestion/heartburn, hematuria, incontinence, genital sores, muscle weakness, suspicious skin lesions, transient blindness, difficulty walking, depression, unusual weight change, abnormal bleeding, enlarged lymph nodes, angioedema, and breast masses.    TRAVEL HISTORY:  None  Past Medical History  Diagnosis Date  . History of MI (myocardial infarction) March 2007    Cath showed multivessel disease, referred for CABG  . CAD in native artery March 2007    Cath for dyspnea on exertion: 75% distal LM, 85% RI, 95% mid-distal Cx, in multiple RCA lesions with 95% distal. -->  Referred for CABG  . S/P CABG x 24 July 2005    LIMA-LAD, SVG-OM, seq SVG-PDA -PLB  . Atrial fibrillation, chronic     Anticoagulated on warfarin. Stable -- post op  . H/O echocardiogram March 2007    Normal LV size and cavity appeared normal function. Grade 1 diastolic dysfunction. Limited study.  . Obesity (BMI 30-39.9)     One year ago weighed 265 pounds, (12/2012) -- now 234 pounds  . Dyslipidemia, goal LDL below 70   . COPD (chronic obstructive pulmonary disease) with emphysema 05/23/2007    Hosp 5/29-6/01/12- COPD exacerbation ONOX 12/08/10- desaturated to less than 88% for over an hour, qualifying for home O2 during sleep    . Hypoxia      chronic, on home O2  . History of pneumonia   . Seasonal allergies    Past Surgical History  Procedure Laterality Date  . Cataract extraction      Laser  . Rll resection for hamartoma    . Rul for hamartoma    . Coronary artery bypass graft    . Cardiac catheterization  03 28 2007    NORMAL LV FUNCTION/ ABDOMINAL AORTA STENOSIS,75%-85%. RIGHT FEMORAL ARTERY :CATHETERS USED A  4-FRENCH WITH A 4-FRENCH SHEATH   Social History:  reports that he quit smoking about 14 years ago. His smoking use included Cigarettes. He has a 110 pack-year smoking history. He has never used smokeless tobacco. His alcohol and drug histories are not on file. where does patient live--home, wife Can patient participate in ADLs?  No Known Allergies  Family History  Problem Relation Age of Onset  . Heart attack Mother   . Heart attack Father  Prior to Admission medications   Medication Sig Start Date End Date Taking? Authorizing Provider  albuterol (PROVENTIL HFA;VENTOLIN HFA) 108 (90 BASE) MCG/ACT inhaler Inhale 2 puffs into the lungs every 6 (six) hours as needed for wheezing or shortness of breath. 09/27/12 09/27/13 Yes Waymon Budge, MD  aspirin 81 MG tablet Take 81 mg by mouth daily.     Yes Historical Provider, MD  betamethasone dipropionate (DIPROLENE)  0.05 % cream Apply 1 application topically daily. 04/15/13  Yes Historical Provider, MD  budesonide-formoterol (SYMBICORT) 160-4.5 MCG/ACT inhaler Inhale 2 puffs into the lungs 2 (two) times daily. 10/14/11  Yes Waymon Budge, MD  cholecalciferol (VITAMIN D) 1000 UNITS tablet Take 1,000 Units by mouth daily.   Yes Historical Provider, MD  Fluticasone Furoate-Vilanterol (BREO ELLIPTA) 100-25 MCG/INH AEPB Inhale 1 puff into the lungs daily. 03/12/13  Yes Waymon Budge, MD  furosemide (LASIX) 40 MG tablet Take 40 mg by mouth daily.   Yes Historical Provider, MD  hydrOXYzine (ATARAX/VISTARIL) 25 MG tablet Take 25 mg by mouth 2 (two) times daily. 04/15/13  Yes Historical Provider, MD  ipratropium-albuterol (DUONEB) 0.5-2.5 (3) MG/3ML SOLN Take 3 mLs by nebulization every 6 (six) hours as needed (wheezing and shortness of breath).    Yes Historical Provider, MD  permethrin (ELIMITE) 5 % cream Apply 1 application topically 2 (two) times daily. 04/22/13  Yes Historical Provider, MD  potassium chloride SA (K-DUR,KLOR-CON) 20 MEQ tablet Take 20 mEq by mouth daily.   Yes Historical Provider, MD  simvastatin (ZOCOR) 40 MG tablet Take 40 mg by mouth every evening.   Yes Historical Provider, MD  sotalol (BETAPACE) 80 MG tablet Take 1 tablet by mouth daily. 02/17/13  Yes Historical Provider, MD  warfarin (COUMADIN) 2.5 MG tablet Take 1 tablet (2.5 mg total) by mouth daily. 02/19/13  Yes Phillips Hay, RPH-CPP  zolpidem (AMBIEN) 10 MG tablet Take 5 mg by mouth at bedtime.    Yes Historical Provider, MD   Physical Exam: Filed Vitals:   04/29/13 1058  BP: 126/82  Temp: 97.7 F (36.5 C)  TempSrc: Oral  SpO2: 94%     General:  A./O. x4, mild distress secondary to SOB  Eyes: Pupils equal round reactive to light and accommodation  Neck: Negative JVD  Cardiovascular: Irregular irregular rhythm and rate, negative murmurs rubs gallop, DP/PT +1 bilateral  Respiratory: Diffuse wheezing all lung  fields  Abdomen: Soft, nontender, nondistended, plus bowel sounds  Skin: Multiple excoriation sites on arms legs and back consistent with scabies. Multiple sites on on his legs groin.which are healing and have consistency of healing scabies lesions  Musculoskeletal: Negative pedal edema, bilateral erythema on ankles and feet  Neurologic: Cranial nerve II through XII intact, all extremities strength 5/5, sensation intact throughout,  Labs on Admission:  Basic Metabolic Panel:  Recent Labs Lab 04/29/13 1130  NA 137  K 4.2  CL 95*  CO2 33*  GLUCOSE 108*  BUN 31*  CREATININE 1.20  CALCIUM 9.5   Liver Function Tests: No results found for this basename: AST, ALT, ALKPHOS, BILITOT, PROT, ALBUMIN,  in the last 168 hours No results found for this basename: LIPASE, AMYLASE,  in the last 168 hours No results found for this basename: AMMONIA,  in the last 168 hours CBC:  Recent Labs Lab 04/29/13 1130  WBC 12.4*  NEUTROABS 9.4*  HGB 17.7*  HCT 51.7  MCV 88.4  PLT 164   Cardiac Enzymes: No results found for this basename: CKTOTAL,  CKMB, CKMBINDEX, TROPONINI,  in the last 168 hours  BNP (last 3 results) No results found for this basename: PROBNP,  in the last 8760 hours CBG: No results found for this basename: GLUCAP,  in the last 168 hours  Radiological Exams on Admission: Dg Chest 2 View  04/29/2013   CLINICAL DATA:  Cough, weakness  EXAM: CHEST  2 VIEW  COMPARISON:  03/12/2013  FINDINGS: Cardiomediastinal silhouette is stable. Status post CABG. Stable volume loss right lung. Stable chronic blunting of the right costophrenic angle. No acute infiltrate or pulmonary edema. Degenerative changes thoracic spine again noted.  IMPRESSION: No active disease.  No significant change.   Electronically Signed   By: Natasha Mead M.D.   On: 04/29/2013 13:15    EKG: Pending  Assessment/Plan Active Problems:   * No active hospital problems. *  COPD exacerbation -Will continue patient on  high dose IV steroids; Solu-Medrol 80 mg daily -Continue levofloxacin 500mg  daily -Mucinex DM -Scheduled nebulizer treatment  Atrial fibrillation -Currently on exam patient in A. fib, will obtain EKG -Obtain proBNP, since patient will need to sustain fluid load and patient cannot remember the last time he has seen a cardiologist. -Continue Coumadin per pharmacy  S/P CABG -After patient stabilized obtain echocardiogram -Obtain lipid panel  HTN -Continue sotalol 80 mg -Continue Lasix -Continue home K-Dur dose  HLD -Obtain lipid panel -Continue Zocor 40 mg    History of scabies -Patient completed a course of Permethrin approximately 2 weeks ago  -Will maintain patient on contact precautions since lesions are not fully healed -Hydroxyzine. For Pruritis       Code Status: Full Family Communication: No family available Disposition Plan: Estimate resolution of COPD exacerbation and 48-72 hours  Time spent: 90 minutes  WOODS, Roselind Messier [facility] Pager [phone]  If 7PM-7AM, please contact night-coverage [email or web address] Password [email or web address] 04/29/2013, 2:17 PM

## 2013-04-29 NOTE — Progress Notes (Addendum)
ANTICOAGULATION CONSULT NOTE - Initial Consult  Pharmacy Consult for Warfarin Indication: atrial fibrillation  No Known Allergies  Patient Measurements: Height: 6' (182.9 cm) Weight: 218 lb 12.8 oz (99.247 kg) IBW/kg (Calculated) : 77.6   Vital Signs: Temp: 97.2 F (36.2 C) (12/08 1532) Temp src: Oral (12/08 1532) BP: 143/55 mmHg (12/08 1532) Pulse Rate: 55 (12/08 1532)  Labs:  Recent Labs  04/29/13 1130  HGB 17.7*  HCT 51.7  PLT 164  LABPROT 29.3*  INR 2.90*  CREATININE 1.20    Estimated Creatinine Clearance: 62.9 ml/min (by C-G formula based on Cr of 1.2).   Medical History: Past Medical History  Diagnosis Date  . History of MI (myocardial infarction) March 2007    Cath showed multivessel disease, referred for CABG  . CAD in native artery March 2007    Cath for dyspnea on exertion: 75% distal LM, 85% RI, 95% mid-distal Cx, in multiple RCA lesions with 95% distal. --> Referred for CABG  . S/P CABG x 24 July 2005    LIMA-LAD, SVG-OM, seq SVG-PDA -PLB  . Atrial fibrillation, chronic     Anticoagulated on warfarin. Stable -- post op  . H/O echocardiogram March 2007    Normal LV size and cavity appeared normal function. Grade 1 diastolic dysfunction. Limited study.  . Obesity (BMI 30-39.9)     One year ago weighed 265 pounds, (12/2012) -- now 234 pounds  . Dyslipidemia, goal LDL below 70   . COPD (chronic obstructive pulmonary disease) with emphysema 05/23/2007    Hosp 5/29-6/01/12- COPD exacerbation ONOX 12/08/10- desaturated to less than 88% for over an hour, qualifying for home O2 during sleep    . Hypoxia      chronic, on home O2  . History of pneumonia   . Seasonal allergies     Medications:  Scheduled:  . albuterol  2.5 mg Nebulization TID  . aspirin EC  81 mg Oral Daily  . [START ON 04/30/2013] cholecalciferol  1,000 Units Oral Daily  . [START ON 04/30/2013] furosemide  40 mg Oral Daily  . hydrOXYzine  25 mg Oral BID  . ipratropium  0.5 mg  Nebulization TID  . [START ON 04/30/2013] levofloxacin  500 mg Oral Daily  . permethrin  1 application Topical BID  . [START ON 04/30/2013] potassium chloride SA  20 mEq Oral Daily  . [START ON 04/30/2013] predniSONE  60 mg Oral Q breakfast  . simvastatin  40 mg Oral QPM  . [START ON 04/30/2013] sotalol  80 mg Oral Daily   Infusions:  . sodium chloride     PRN: acetaminophen, acetaminophen, morphine injection  Assessment:  79 yo M with suspected bronchitis with associated COPD exacerbation.    Patient is on chronic anticoagulation with warfarin for A.Fib   Home dose = 2.5 mg daily; Last dose prior to admission reported on 12/8  INR on admission is therapeutic at 2.9; CBC okay, plt 164  No bleeding/issues noted with anticoagulation  Per coag-clinic notes his INR was 2.7 on 11/26 and pt. Was instructed to take 2.5 mg daily  DI Levaquin: has potential to increase INR  Agree with d/c VTE px lovenox dose since INR > 2.    Goal of Therapy:  INR 2-3 Monitor platelets by anticoagulation protocol: Yes   Plan:  1.) No coumadin today as patient already took at home 2.) Daily PT/INR 3.) Monitor for s/sx bleeding   [place] Acklin, Loma Messing PharmD Pager #: [phone] 4:58 PM 04/29/2013

## 2013-04-29 NOTE — ED Notes (Signed)
Pt given ham sandwich and coke, ok'd by Dr. Patria Mane

## 2013-04-29 NOTE — Progress Notes (Signed)
Pt declined Atarax at this time due to unfamiliar although it was listed on PTA med list from pharmacy. Pt notified of this and the dosing from the pharmacy. Pt states that if he currently takes it, he only takes it once a day and he has taken his morning meds today. He also states that it sounds like a med that he was taking but is not longer taking or one that his wife was taking. Wishes to clarify with MD.

## 2013-04-29 NOTE — ED Notes (Signed)
Patient from [facility] sent here for COPD exacerbation.  Patient was given an albuterol neb in MD's office.  O2 sats 97% on room air.

## 2013-04-29 NOTE — ED Notes (Signed)
RT notified

## 2013-04-29 NOTE — ED Provider Notes (Signed)
CSN: [id number]     Arrival date & time 04/29/13  1041 History   First MD Initiated Contact with Patient 04/29/13 1123     Chief Complaint  Patient presents with  . COPD     HPI Patient reports worsening shortness of breath over the past several days.  Chills without documented fever.  Does have productive cough.  Denies hemoptysis.  No abdominal pain.  No chest pain.  Exertional shortness of breath.  Attempt his inhalers at home without improvement in his symptoms.  He was seen by his primary care physician today and was given 2 nebulized treatments without improvement in his symptoms.  Since the ER for evaluation.  Patient reports continuing mild to moderate ongoing shortness of breath.   Past Medical History  Diagnosis Date  . History of MI (myocardial infarction) March 2007    Cath showed multivessel disease, referred for CABG  . CAD in native artery March 2007    Cath for dyspnea on exertion: 75% distal LM, 85% RI, 95% mid-distal Cx, in multiple RCA lesions with 95% distal. --> Referred for CABG  . S/P CABG x 24 July 2005    LIMA-LAD, SVG-OM, seq SVG-PDA -PLB  . Atrial fibrillation, chronic     Anticoagulated on warfarin. Stable -- post op  . H/O echocardiogram March 2007    Normal LV size and cavity appeared normal function. Grade 1 diastolic dysfunction. Limited study.  . Obesity (BMI 30-39.9)     One year ago weighed 265 pounds, (12/2012) -- now 234 pounds  . Dyslipidemia, goal LDL below 70   . COPD (chronic obstructive pulmonary disease) with emphysema 05/23/2007    Hosp 5/29-6/01/12- COPD exacerbation ONOX 12/08/10- desaturated to less than 88% for over an hour, qualifying for home O2 during sleep    . Hypoxia      chronic, on home O2  . History of pneumonia   . Seasonal allergies    Past Surgical History  Procedure Laterality Date  . Cataract extraction      Laser  . Rll resection for hamartoma    . Rul for hamartoma    . Coronary artery bypass graft    . Cardiac  catheterization  03 28 2007    NORMAL LV FUNCTION/ ABDOMINAL AORTA STENOSIS,75%-85%. RIGHT FEMORAL ARTERY :CATHETERS USED A  4-FRENCH WITH A 4-FRENCH SHEATH   Family History  Problem Relation Age of Onset  . Heart attack Mother   . Heart attack Father    History  Substance Use Topics  . Smoking status: Former Smoker -- 2.00 packs/day for 55 years    Types: Cigarettes    Quit date: 05/23/1998  . Smokeless tobacco: Never Used  . Alcohol Use: Not on file    Review of Systems  All other systems reviewed and are negative.    Allergies  Review of patient's allergies indicates no known allergies.  Home Medications   Current Outpatient Rx  Name  Route  Sig  Dispense  Refill  . albuterol (PROVENTIL HFA;VENTOLIN HFA) 108 (90 BASE) MCG/ACT inhaler   Inhalation   Inhale 2 puffs into the lungs every 6 (six) hours as needed for wheezing or shortness of breath.   1 Inhaler   prn   . aspirin 81 MG tablet   Oral   Take 81 mg by mouth daily.           . betamethasone dipropionate (DIPROLENE) 0.05 % cream   Topical   Apply 1 application topically daily.         Marland Kitchen  budesonide-formoterol (SYMBICORT) 160-4.5 MCG/ACT inhaler   Inhalation   Inhale 2 puffs into the lungs 2 (two) times daily.   3 Inhaler   3     Symbicort 160 (lot) [id number], exp 02, 2014   . cholecalciferol (VITAMIN D) 1000 UNITS tablet   Oral   Take 1,000 Units by mouth daily.         . Fluticasone Furoate-Vilanterol (BREO ELLIPTA) 100-25 MCG/INH AEPB   Inhalation   Inhale 1 puff into the lungs daily.   2 each   0   . furosemide (LASIX) 40 MG tablet   Oral   Take 40 mg by mouth daily.         . hydrOXYzine (ATARAX/VISTARIL) 25 MG tablet   Oral   Take 25 mg by mouth 2 (two) times daily.         Marland Kitchen ipratropium-albuterol (DUONEB) 0.5-2.5 (3) MG/3ML SOLN   Nebulization   Take 3 mLs by nebulization every 6 (six) hours as needed (wheezing and shortness of breath).          . permethrin (ELIMITE)  5 % cream   Topical   Apply 1 application topically 2 (two) times daily.         . potassium chloride SA (K-DUR,KLOR-CON) 20 MEQ tablet   Oral   Take 20 mEq by mouth daily.         . simvastatin (ZOCOR) 40 MG tablet   Oral   Take 40 mg by mouth every evening.         . sotalol (BETAPACE) 80 MG tablet   Oral   Take 1 tablet by mouth daily.         Marland Kitchen warfarin (COUMADIN) 2.5 MG tablet   Oral   Take 1 tablet (2.5 mg total) by mouth daily.   90 tablet   3   . zolpidem (AMBIEN) 10 MG tablet   Oral   Take 5 mg by mouth at bedtime.           BP 126/82  Temp(Src) 97.7 F (36.5 C) (Oral)  SpO2 94% Physical Exam  Nursing note and vitals reviewed. Constitutional: He is oriented to person, place, and time. He appears well-developed and well-nourished.  HENT:  Head: Normocephalic and atraumatic.  Eyes: EOM are normal.  Neck: Normal range of motion.  Cardiovascular: Normal rate, regular rhythm, normal heart sounds and intact distal pulses.   Pulmonary/Chest: Effort normal. No respiratory distress. He has wheezes.  Abdominal: Soft. He exhibits no distension. There is no tenderness.  Musculoskeletal: Normal range of motion.  Neurological: He is alert and oriented to person, place, and time.  Skin: Skin is warm and dry.  Psychiatric: He has a normal mood and affect. Judgment normal.    ED Course  Procedures (including critical care time) Labs Review Labs Reviewed  CBC WITH DIFFERENTIAL - Abnormal; Notable for the following:    WBC 12.4 (*)    RBC 5.85 (*)    Hemoglobin 17.7 (*)    Neutro Abs 9.4 (*)    All other components within normal limits  BASIC METABOLIC PANEL - Abnormal; Notable for the following:    Chloride 95 (*)    CO2 33 (*)    Glucose, Bld 108 (*)    BUN 31 (*)    GFR calc non Af Amer 57 (*)    GFR calc Af Amer 66 (*)    All other components within normal limits  PROTIME-INR - Abnormal; Notable for the  following:    Prothrombin Time 29.3 (*)     INR 2.90 (*)    All other components within normal limits   Imaging Review Dg Chest 2 View  04/29/2013   CLINICAL DATA:  Cough, weakness  EXAM: CHEST  2 VIEW  COMPARISON:  03/12/2013  FINDINGS: Cardiomediastinal silhouette is stable. Status post CABG. Stable volume loss right lung. Stable chronic blunting of the right costophrenic angle. No acute infiltrate or pulmonary edema. Degenerative changes thoracic spine again noted.  IMPRESSION: No active disease.  No significant change.   Electronically Signed   By: Natasha Mead M.D.   On: 04/29/2013 13:15   I personally reviewed the imaging tests through PACS system I reviewed available ER/hospitalization records through the EMR  EKG Interpretation   None       MDM  No diagnosis found. Suspect bronchitis with associated COPD exacerbation.  The patient continues to feel short of breath this time and would prefer to come in the hospital.  Steroids given.  Ongoing albuterol and Atrovent in the ER.  Patient be given at this Levaquin for what appears to be bronchitis associated COPD exacerbation    Lyanne Co, MD 04/29/13 1401

## 2013-04-29 NOTE — Telephone Encounter (Signed)
Left msg for patient to call and schedule next coumadin appt (around January 7)

## 2013-04-29 NOTE — ED Notes (Signed)
Report given to nurse on floor 

## 2013-04-29 NOTE — Progress Notes (Signed)
Patient admitted from ED to 1420, alert and oriented, denies any pain/distress. Oriented patient to room/unit and reviewed plan of care with patient. Will continue to assess patient.

## 2013-04-30 DIAGNOSIS — J438 Other emphysema: Secondary | ICD-10-CM

## 2013-04-30 DIAGNOSIS — J441 Chronic obstructive pulmonary disease with (acute) exacerbation: Principal | ICD-10-CM

## 2013-04-30 DIAGNOSIS — I4891 Unspecified atrial fibrillation: Secondary | ICD-10-CM

## 2013-04-30 DIAGNOSIS — J40 Bronchitis, not specified as acute or chronic: Secondary | ICD-10-CM

## 2013-04-30 DIAGNOSIS — I2510 Atherosclerotic heart disease of native coronary artery without angina pectoris: Secondary | ICD-10-CM

## 2013-04-30 LAB — LIPID PANEL
Total CHOL/HDL Ratio: 4.2 RATIO
VLDL: 17 mg/dL (ref 0–40)

## 2013-04-30 LAB — COMPREHENSIVE METABOLIC PANEL WITH GFR
ALT: 21 U/L (ref 0–53)
AST: 14 U/L (ref 0–37)
Albumin: 3.5 g/dL (ref 3.5–5.2)
Alkaline Phosphatase: 48 U/L (ref 39–117)
BUN: 31 mg/dL — ABNORMAL HIGH (ref 6–23)
CO2: 29 meq/L (ref 19–32)
Calcium: 9.2 mg/dL (ref 8.4–10.5)
Chloride: 99 meq/L (ref 96–112)
Creatinine, Ser: 1.25 mg/dL (ref 0.50–1.35)
GFR calc Af Amer: 62 mL/min — ABNORMAL LOW
GFR calc non Af Amer: 54 mL/min — ABNORMAL LOW
Glucose, Bld: 133 mg/dL — ABNORMAL HIGH (ref 70–99)
Potassium: 5.5 meq/L — ABNORMAL HIGH (ref 3.5–5.1)
Sodium: 138 meq/L (ref 135–145)
Total Bilirubin: 0.6 mg/dL (ref 0.3–1.2)
Total Protein: 6.3 g/dL (ref 6.0–8.3)

## 2013-04-30 LAB — CBC WITH DIFFERENTIAL/PLATELET
Basophils Absolute: 0 10*3/uL (ref 0.0–0.1)
Basophils Relative: 0 % (ref 0–1)
Eosinophils Relative: 0 % (ref 0–5)
HCT: 50 % (ref 39.0–52.0)
Hemoglobin: 16.9 g/dL (ref 13.0–17.0)
Lymphs Abs: 1.8 10*3/uL (ref 0.7–4.0)
MCHC: 33.8 g/dL (ref 30.0–36.0)
MCV: 88 fL (ref 78.0–100.0)
Monocytes Absolute: 0.5 10*3/uL (ref 0.1–1.0)
RBC: 5.68 MIL/uL (ref 4.22–5.81)
RDW: 13.8 % (ref 11.5–15.5)

## 2013-04-30 LAB — TROPONIN I: Troponin I: <0.3 ng/mL

## 2013-04-30 LAB — PROTIME-INR: Prothrombin Time: 33.6 seconds — ABNORMAL HIGH (ref 11.6–15.2)

## 2013-04-30 MED ORDER — POLYETHYLENE GLYCOL 3350 17 G PO PACK
17.0000 g | PACK | Freq: Every day | ORAL | Status: DC | PRN
  Administered 2013-04-30: 17 g via ORAL
  Filled 2013-04-30: qty 1

## 2013-04-30 MED ORDER — SOTALOL HCL 80 MG PO TABS
40.0000 mg | ORAL_TABLET | Freq: Every day | ORAL | Status: DC
  Filled 2013-04-30: qty 0.5

## 2013-04-30 MED ORDER — DEXTROSE 5 % IV SOLN
1.0000 g | INTRAVENOUS | Status: DC
  Administered 2013-04-30 – 2013-05-01 (×2): 1 g via INTRAVENOUS
  Filled 2013-04-30 (×2): qty 10

## 2013-04-30 MED ORDER — METHYLPREDNISOLONE SODIUM SUCC 125 MG IJ SOLR
80.0000 mg | Freq: Once | INTRAMUSCULAR | Status: AC
  Administered 2013-04-30: 11:00:00 80 mg via INTRAVENOUS
  Filled 2013-04-30: qty 1.28

## 2013-04-30 MED ORDER — ATORVASTATIN CALCIUM 40 MG PO TABS
40.0000 mg | ORAL_TABLET | Freq: Every day | ORAL | Status: DC
  Administered 2013-04-30: 40 mg via ORAL
  Filled 2013-04-30 (×2): qty 1

## 2013-04-30 MED ORDER — OXYMETAZOLINE HCL 0.05 % NA SOLN
1.0000 | Freq: Two times a day (BID) | NASAL | Status: DC
  Administered 2013-04-30 – 2013-05-01 (×3): 1 via NASAL
  Filled 2013-04-30: qty 15

## 2013-04-30 MED ORDER — PREDNISONE 50 MG PO TABS
60.0000 mg | ORAL_TABLET | Freq: Every day | ORAL | Status: DC
  Administered 2013-05-01: 09:00:00 60 mg via ORAL
  Filled 2013-04-30 (×2): qty 1

## 2013-04-30 MED ORDER — FLUOXETINE HCL 10 MG PO CAPS
10.0000 mg | ORAL_CAPSULE | Freq: Every day | ORAL | Status: DC
  Administered 2013-04-30 – 2013-05-01 (×2): 10 mg via ORAL
  Filled 2013-04-30 (×2): qty 1

## 2013-04-30 MED ORDER — SALINE SPRAY 0.65 % NA SOLN
1.0000 | NASAL | Status: DC | PRN
  Administered 2013-05-01: 09:00:00 1 via NASAL
  Filled 2013-04-30: qty 44

## 2013-04-30 MED ORDER — ZOLPIDEM TARTRATE 5 MG PO TABS
5.0000 mg | ORAL_TABLET | Freq: Every day | ORAL | Status: DC
  Administered 2013-04-30: 5 mg via ORAL
  Filled 2013-04-30: qty 1

## 2013-04-30 NOTE — Clinical Documentation Improvement (Signed)
Please clarify respiratory status. Thank you.  Possible Clinical Conditions?  Acute Respiratory Failure Acute on Chronic Respiratory Failure Chronic Respiratory Failure Other Condition Cannot Clinically Determine   Supporting Information: Risk Factors: Admitted with COPD exacerbation History of COPD and Oxygen dependence  Signs & Symptoms: Mild distress secondary to shortness of breath  Treatment: Consult to respiratory care treatement Proventil 2.5mg  neb tid Proventil 5mg  neb x 1 on admission Atrovent 0.5mg  neb tid Atrovent 0.5mg  neb x 1 on admission COPD Gold protocol Pulse Ox check with vital signs O2 therapy -Keep sats between 88% and 92% with heated humidity    Thank You, Harless Litten ,RN Clinical Documentation Specialist:  [phone]  [facility]- Health Information Management

## 2013-04-30 NOTE — Evaluation (Signed)
Occupational Therapy Evaluation Patient Details Name: Roberto Reilly MRN: [id number] DOB: 1933-06-01 Today's Date: 04/30/2013 Time: 4098-1191 OT Time Calculation (min): 19 min  OT Assessment / Plan / Recommendation History of present illness Per chart:  Antonyo Hinderer is a 79 y.o. male PMHx COPD with emphysema, atrial fibrillation on chronic anticoagulant (no cardiologist), CABG x4, HLD, scabies (last treated 2 weeks ago).S/P RLL lobectomy approximately 2000 I. Dr. Parks Ranger at [facility] (secondary to unknown malignancy). Date starting last Wednesday began to have increasing shortness of breath NOTE patient on home O2 at 2 L via [place] unable to get appt w/ Dr Ricki Miller until today. He is reported positive increased SOB, positive productive cough (green), negative fever, positive chills.   Denies hemoptysis. No abdominal pain. No chest pain. Positive DOE.  Attempt his inhalers at home without improvement in his symptoms. Dr. Ricki Miller today patient with 2 nebulized treatments without improvement in his symptoms. Since the ER for evaluation. Patient reports continuing mild to moderate ongoing shortness of breath. Currently on 2 L O2 via [place] and while sitting still feels negative SOB.   Clinical Impression   Pt was admitted for COPD exacerbation.  At baseline, he is independent with all adls/iadls--daughter helps with shopping.  He will benefit from skilled OT in acute to increase safety with adls and activity tolerance.  Goals in acute are for supervision level.    OT Assessment  Patient needs continued OT Services    Follow Up Recommendations  No OT follow up    Barriers to Discharge      Equipment Recommendations   (pt does not want DME)    Recommendations for Other Services    Frequency  Min 2X/week    Precautions / Restrictions Precautions Precautions: Fall Precaution Comments: chair alarm Restrictions Weight Bearing Restrictions: No   Pertinent Vitals/Pain Pt ambulated to closet without 02 and sats  dropped to 73%. Cued for pursed lip breathing and sats increased to 99%.      ADL  Toilet Transfer: Min Pension scheme manager Method: Sit to stand Transfers/Ambulation Related to ADLs: pt ambulated to closet to retrieve clothes--min guard, no LOB with OT.  Pt states he walked down hall a little while ago ADL Comments: Pt has had copd for 15 years.  He plans and prioritizes activities.  Pt does not have a seat in shower but he has a system to minimize time in shower.  Reviewed energy conservation and educated on luke warm water to preserve energy.  Pt's sats dropped after walking to closet but quickly returned to 99.  Pt is able to complete adls with set up, sit to stand or min guard to gather clothes.  Educated in pursed lip breathing:  pt states he sometimes does this    OT Diagnosis: Generalized weakness  OT Problem List: Decreased strength;Decreased activity tolerance;Cardiopulmonary status limiting activity OT Treatment Interventions: Self-care/ADL training;DME and/or AE instruction;Patient/family education;Balance training   OT Goals(Current goals can be found in the care plan section) Acute Rehab OT Goals Patient Stated Goal: go home OT Goal Formulation: With patient Time For Goal Achievement: 05/14/13 Potential to Achieve Goals: Good ADL Goals Pt Will Transfer to Toilet: with supervision;ambulating;regular height toilet Additional ADL Goal #1: pt will gather clothes for adls at supervision level  Visit Information         Prior Functioning     Home Living Family/patient expects to be discharged to:: Private residence Living Arrangements: Spouse/significant other Available Help at Discharge: Family Type of Home:  Apartment Home Access: Stairs to enter Entrance Stairs-Number of Steps: 1 Entrance Stairs-Rails: None Home Layout: One level Home Equipment: Grab bars - tub/shower Additional Comments: pt's daughter lives in [place] and can get groceries etc.  Pt does all  home management; wife is able to do bADLs herself Prior Function Level of Independence: Independent Communication Communication: No difficulties         Vision/Perception     Cognition  Cognition Arousal/Alertness: Awake/alert Behavior During Therapy: WFL for tasks assessed/performed Overall Cognitive Status: Within Functional Limits for tasks assessed    Extremity/Trunk Assessment Upper Extremity Assessment Upper Extremity Assessment: Overall WFL for tasks assessed Lower Extremity Assessment Lower Extremity Assessment: Generalized weakness Cervical / Trunk Assessment Cervical / Trunk Assessment: Normal     Mobility Bed Mobility Bed Mobility:  (pt seated in chair when PT arrived) Transfers Sit to Stand: 5: Supervision Stand to Sit: 5: Supervision     Exercise     Balance    End of Session OT - End of Session Activity Tolerance: Patient tolerated treatment well Patient left: in chair;with call bell/phone within reach;with chair alarm set  GO     Jayelle Page 04/30/2013, 3:30 PM Marica Otter, OTR/L [phone] 04/30/2013

## 2013-04-30 NOTE — Progress Notes (Signed)
Spoke with Donnamarie Poag, NP. She is aware of EKG results and continued frequent bradycardia with HR <40. No new orders received. Pt continues to be asymptomatic at this time. Will continue to monitor.

## 2013-04-30 NOTE — Progress Notes (Addendum)
[facility] PROGRESS NOTE  Talbot Monarch ZOX:[id number] DOB: 02-Feb-1934 DOA: 04/29/2013 PCP: Juline Patch, MD  Roberto Reilly is a 79 y.o. Male with history of COPD s/p RLL lobectomy approximately 2000 on 2L home oxygen prn, atrial fibrillation on chronic anticoagulant followed by [facility], CABG x4, HLD, recent scabies.  Followed by Dr. Parks Ranger at [facility] for unknown malignancy. Presented with SOB consistent with COPD exacerbation.  On antibiotics and steroids and duonebs.  Was bradycardic and having pauses on telemetry overnight so stopped sotalol per cardiology recommendations and monitoring on telemetry overnight.    Assessment/Plan  COPD exacerbation, improving.  Has home oxygen.   - Continued IV steroids today and transition to oral prednisone in AM - Continue abx day 2/3 for COPD exac  - Mucinex DM  - Continue duonebs TID  Atrial fibrillation with pauses and bradycardia to 30s on telemetry overnight which was persistent despite the recorded VS in the computer.  Pauses ~2 seconds.   - Discussed with Franky Macho from [facility] who recommended stopping sotalol altogether and not replacing it with any BB or CCB at this time. -  Close follow up in clinic -  Continue telemetry to determine if pauses/bradycardia improve with this change -  Coumadin per pharmacy  S/P CABG, chest pain free.   -holding BB per cardiology recommendations due to pauses and bradycardia - continue asa and statin  HTN,  - d/c sotalol and trend BP -  May need additional BP medication (try norvasc?)  HLD, LDL not at goal < 70 Lab Results  Component Value Date   CHOL 181 04/30/2013   HDL 43 04/30/2013   LDLCALC 121* 04/30/2013   TRIG 86 04/30/2013   CHOLHDL 4.2 04/30/2013   -  D/c simvastatin, start atorvastatin  Depression and anxiety -  Start prozac 10mg  (low dose as geriatric) and titrate q2weeks  Scabies:  Adequately treated.  No need for contact precautions.    QTc prolongation:  Change levofloxacin to ceftriaxone (no  PNA on CXR so do not need atypical coverage) and d/c sotalol  Diet:  Healthy heart Access:  PIV IVF:  OFF Proph:  warfarin  Code Status: FULL Family Communication: patient alone Disposition Plan: possibly home tomorrow if breathing and HR improved.  Patient already has home oxygen.     Consultants:  [facility] by phone  Procedures:  CXR  Antibiotics:  Levofloxacin 12/8 >> 12/9  Ceftriaxone 12/9    HPI/Subjective:  States he still feels somewhat Ollen Rao of breath.  Feels anxious and depressed.  Worried about his wife, however, she is currently being cared for by another family member.  Objective: Filed Vitals:   04/30/13 0503 04/30/13 0744 04/30/13 1441 04/30/13 1548  BP: 149/73   158/72  Pulse: 114   53  Temp: 97.8 F (36.6 C)   97.5 F (36.4 C)  TempSrc: Oral   Oral  Resp: 24   18  Height:      Weight:      SpO2: 99% 98% 94% 95%    Intake/Output Summary (Last 24 hours) at 04/30/13 2039 Last data filed at 04/30/13 1906  Gross per 24 hour  Intake 2133.42 ml  Output   1990 ml  Net 143.42 ml   Filed Weights   04/29/13 1532  Weight: 99.247 kg (218 lb 12.8 oz)    Exam:   General:  CM, anxious, No acute distress  HEENT:  NCAT, MMM  Cardiovascular:  IRRR and bradycardic to 40s during exam, nl S1, S2 no mrg, 2+  pulses, warm extremities  Respiratory:  Diminished bilateral BS with prolonged exp phase, high pitched exp wheeze, no focal rales, + rhonchi, no increased WOB  Abdomen:   NABS, soft, NT/ND  MSK:   Normal tone and bulk, no LEE  Neuro:  Grossly intact  Data Reviewed: Basic Metabolic Panel:  Recent Labs Lab 04/29/13 1130 04/29/13 1716 04/30/13 0537  NA 137  --  138  K 4.2  --  5.5*  CL 95*  --  99  CO2 33*  --  29  GLUCOSE 108*  --  133*  BUN 31*  --  31*  CREATININE 1.20  --  1.25  CALCIUM 9.5  --  9.2  MG  --  2.5 2.7*   Liver Function Tests:  Recent Labs Lab 04/30/13 0537  AST 14  ALT 21  ALKPHOS 48  BILITOT 0.6  PROT 6.3   ALBUMIN 3.5   No results found for this basename: LIPASE, AMYLASE,  in the last 168 hours No results found for this basename: AMMONIA,  in the last 168 hours CBC:  Recent Labs Lab 04/29/13 1130 04/30/13 0537  WBC 12.4* 12.7*  NEUTROABS 9.4* 10.3*  HGB 17.7* 16.9  HCT 51.7 50.0  MCV 88.4 88.0  PLT 164 148*   Cardiac Enzymes:  Recent Labs Lab 04/29/13 1718 04/29/13 2226 04/30/13 0537  TROPONINI <0.30 <0.30 <0.30   BNP (last 3 results)  Recent Labs  04/29/13 1716  PROBNP 445.2   CBG: No results found for this basename: GLUCAP,  in the last 168 hours  No results found for this or any previous visit (from the past 240 hour(s)).   Studies: Dg Chest 2 View  04/29/2013   CLINICAL DATA:  Cough, weakness  EXAM: CHEST  2 VIEW  COMPARISON:  03/12/2013  FINDINGS: Cardiomediastinal silhouette is stable. Status post CABG. Stable volume loss right lung. Stable chronic blunting of the right costophrenic angle. No acute infiltrate or pulmonary edema. Degenerative changes thoracic spine again noted.  IMPRESSION: No active disease.  No significant change.   Electronically Signed   By: Natasha Mead M.D.   On: 04/29/2013 13:15    Scheduled Meds: . albuterol  2.5 mg Nebulization TID  . aspirin EC  81 mg Oral Daily  . atorvastatin  40 mg Oral q1800  . cefTRIAXone (ROCEPHIN)  IV  1 g Intravenous Q24H  . cholecalciferol  1,000 Units Oral Daily  . clobetasol cream   Topical Daily  . dextromethorphan-guaiFENesin  1 tablet Oral BID  . FLUoxetine  10 mg Oral Daily  . furosemide  40 mg Oral Daily  . ipratropium  0.5 mg Nebulization TID  . oxymetazoline  1 spray Each Nare BID  . [START ON 05/01/2013] predniSONE  60 mg Oral Q breakfast  . Warfarin - Pharmacist Dosing Inpatient   Does not apply q1800  . zolpidem  5 mg Oral QHS   Continuous Infusions: . sodium chloride 20 mL/hr (04/30/13 1119)    Active Problems:   COPD (chronic obstructive pulmonary disease) with emphysema   Long term  (current) use of anticoagulants   CAD in native artery - LM, RCA, RI & Cx disease --> CABG x 4 (LIMA-LAD, SVG-OM/RI, SVG-rPDA-rPL)   S/P CABG x 4   Atrial fibrillation, chronic / persistent   Dyslipidemia, goal LDL below 70   COPD exacerbation   Hx of scabies   HLD (hyperlipidemia)   HTN (hypertension)    Time spent: 30 min    Felice Deem,  [facility]  [facility] Pager [phone]. If 7PM-7AM, please contact night-coverage at [email or web address], password [facility] 04/30/2013, 8:39 PM  LOS: 1 day

## 2013-04-30 NOTE — Care Management Note (Addendum)
    Page 1 of 2   05/01/2013     2:29:34 PM   CARE MANAGEMENT NOTE 05/01/2013  Patient:  Roberto Reilly, Roberto Reilly   Account Number:  [id number]  Date Initiated:  04/30/2013  Documentation initiated by:  Lanier Clam  Subjective/Objective Assessment:   79 Y/O M ADMITTED W/SOB.COPD.     Action/Plan:   FROM HOME W/SPOUSE.HAS HOME 02-LINCARE @ HS.HAS PCP,PHARMACY.   Anticipated DC Date:  05/01/2013   Anticipated DC Plan:  HOME W HOME HEALTH SERVICES      DC Planning Services  Medication Assistance      Choice offered to / List presented to:  C-1 Patient        HH arranged  HH-2 PT  HH-3 OT      [facility] agency  [facility]   Status of service:  Completed, signed off Medicare Important Message given?   (If response is "NO", the following Medicare IM given date fields will be blank) Date Medicare IM given:   Date Additional Medicare IM given:    Discharge Disposition:  HOME W HOME HEALTH SERVICES  Per UR Regulation:  Reviewed for med. necessity/level of care/duration of stay  If discussed at Long Length of Stay Meetings, dates discussed:    Comments:  05/01/13 Careena Degraffenreid RN,BSN NCM [phone] [facility] DEB[facility]E AWARE OF HHPT/OT.ALREADY HAS HOME 02 [place].  04/30/13 Alivia Cimino RN,BSN NCM [phone] PT-HH.PATIENT CHOSE [facility] SINCE SPOUSE USES THEM, & ONLY [facility](OT) CAN COME TO HIS HOME.PER DEBBIE W/[facility] REP-JIM [facility]LL IS AN OT,& PT IS RECOMMENDED-SHE WILL TRY TO HAVE [facility] DO AS MUCH AS HE CAN & IF NEEDED MAY CONTACT PT.PATIENT DECLINES ANY DME.HE STATES HE HAS PRESCRIPTION COVERAGE,THEREFORE NO MED ASST CAN BE PROVIDED.WILL NEED HHPT ORDER.HE DECLINES ANY OTHER HH DISCILPLINES.IF HOME 02 NEEDED FOR DURING DAY CAN ARRANGE W/LINCARE A TRAVEL TANK IF QUALIFIES.

## 2013-04-30 NOTE — Progress Notes (Signed)
ANTICOAGULATION CONSULT NOTE - Follow Up Consult  Pharmacy Consult for Warfarin Indication: atrial fibrillation  No Known Allergies  Labs:  Recent Labs  04/29/13 1130 04/29/13 1718 04/29/13 2226 04/30/13 0537  HGB 17.7*  --   --  16.9  HCT 51.7  --   --  50.0  PLT 164  --   --  148*  LABPROT 29.3*  --   --  33.6*  INR 2.90*  --   --  3.47*  CREATININE 1.20  --   --  1.25  TROPONINI  --  <0.30 <0.30 <0.30     Assessment: 79 yo M on chronic anticoagulation PTA with warfarin for h/o atrial fibrillation. Home dose was reportedly 2.5 mg daily with last dose on 12/8.  Pt admitted 12/8 for suspected bronchitis associated with AECOPD.  INR was therapeutic on admission at 2.9.  Coumadin was not given last night since patient received dose PTA. Despite that, INR trended up to 3.47 today.  Plts down to 148K, H/H okay, no bleeding reported.   Drug-drug interaction noted with ASA and Levaquin, which could increase INR and bleeding risk. Pt with good appetite on a cardiac diet.   Goal of Therapy:  INR 2-3 Monitor platelets by anticoagulation protocol: Yes   Plan:   Hold Coumadin tonight and follow INR trend  Daily PT/INR  Pharmacy will f/u  Geoffry Paradise, PharmD, BCPS Pager: [phone] 10:12 AM Pharmacy #: [id number]

## 2013-04-30 NOTE — Progress Notes (Signed)
Pt with very frequent HR drops<40 per telemetry monitor. Pt asymptomatic. VSS. No noted complaints except inability to sleep. Pt normally takes Ambien at home for sleep. MD aware but did not order for tonight due to respiratory issues per day shift RN. Pt denies SOB or any discomfort. Pt states only cardiac history is a CABG multiple years ago. Craige Cotta, NP notified. Will continue to monitor.

## 2013-04-30 NOTE — Evaluation (Signed)
Physical Therapy Evaluation Patient Details Name: Roberto Reilly MRN: [id number] DOB: 27-Jul-1933 Today's Date: 04/30/2013 Time: 1100-1115 PT Time Calculation (min): 15 min  PT Assessment / Plan / Recommendation History of Present Illness  Roberto Reilly is a 79 y.o. male PMHx COPD with emphysema, atrial fibrillation on chronic anticoagulant (no cardiologist), CABG x4, HLD, scabies (last treated 2 weeks ago).S/P RLL lobectomy approximately 2000 I. Dr. Parks Ranger at [facility] (secondary to unknown malignancy). Date starting last Wednesday began to have increasing shortness of breath NOTE patient on home O2 at 2 L via [place] unable to get appt w/ Dr Ricki Miller until today. He is reported positive increased SOB, positive productive cough (green), negative fever, positive chills.   Denies hemoptysis. No abdominal pain. No chest pain. Positive DOE.  Attempt his inhalers at home without improvement in his symptoms. Dr. Ricki Miller today patient with 2 nebulized treatments without improvement in his symptoms. Since the ER for evaluation. Patient reports continuing mild to moderate ongoing shortness of breath. Currently on 2 L O2 via  and while sitting still feels negative SOB.  Clinical Impression  Pt presents with decreased balance, gait and activity tolerance.  Requires holding to IV pole and min A for safety during gait.  PT educated pt on need for assistive device to help with balance and activity tolerance, pt states he is unwilling to use an assistive device and does not feel that he needs one at this time.  PT recommended HHPT for pt, pt states he doesn't seem why he would need any therapy despite LOB during gait.    PT Assessment  Patient needs continued PT services    Follow Up Recommendations  Home health PT;Supervision - Intermittent    Does the patient have the potential to tolerate intense rehabilitation      Barriers to Discharge        Equipment Recommendations  Other (comment) (Pt will benefit from  assistive device but states he is unwilling to use one)    Recommendations for Other Services     Frequency Min 3X/week    Precautions / Restrictions Precautions Precautions: Fall Precaution Comments: chair alarm Restrictions Weight Bearing Restrictions: No   Pertinent Vitals/Pain Pt seated in chair on room air on PT arrival, spO2 95%.  Pt gait on room air.  SpO2 drops to 88% after gait, returned to 96% on room air after 1 minute seated rest.  RN aware.      Mobility  Bed Mobility Bed Mobility:  (pt seated in chair when PT arrived) Transfers Transfers: Sit to Stand;Stand to Sit Sit to Stand: 5: Supervision Stand to Sit: 5: Supervision Ambulation/Gait Ambulation/Gait Assistance: 4: Min assist Ambulation Distance (Feet): 150 Feet Assistive device: None Ambulation/Gait Assistance Details: Pt held to IV pole during gait, required min A due to 2 x LOB during turning and with divided attention.     Exercises     PT Diagnosis: Difficulty walking;Generalized weakness  PT Problem List: Decreased balance;Decreased activity tolerance;Decreased mobility PT Treatment Interventions: DME instruction;Balance training;Gait training;Neuromuscular re-education;Stair training;Functional mobility training;Therapeutic activities;Therapeutic exercise;Patient/family education     PT Goals(Current goals can be found in the care plan section) Acute Rehab PT Goals Patient Stated Goal: go home PT Goal Formulation: With patient Time For Goal Achievement: 05/07/13 Potential to Achieve Goals: Good  Visit Information  Last PT Received On: 04/30/13 Assistance Needed: +1 History of Present Illness: Roberto Reilly is a 79 y.o. male PMHx COPD with emphysema, atrial fibrillation on chronic anticoagulant (no cardiologist), CABG  x4, HLD, scabies (last treated 2 weeks ago).S/P RLL lobectomy approximately 2000 I. Dr. Parks Ranger at [facility] (secondary to unknown malignancy). Date starting last Wednesday began to  have increasing shortness of breath NOTE patient on home O2 at 2 L via [place] unable to get appt w/ Dr Ricki Miller until today. He is reported positive increased SOB, positive productive cough (green), negative fever, positive chills.   Denies hemoptysis. No abdominal pain. No chest pain. Positive DOE.  Attempt his inhalers at home without improvement in his symptoms. Dr. Ricki Miller today patient with 2 nebulized treatments without improvement in his symptoms. Since the ER for evaluation. Patient reports continuing mild to moderate ongoing shortness of breath. Currently on 2 L O2 via [place] and while sitting still feels negative SOB.       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) Type of Home: Apartment Home Access: Stairs to enter Entrance Stairs-Number of Steps: 1 Entrance Stairs-Rails: None Home Layout: One level Home Equipment: None Additional Comments: pt states he is not willing to use an assistive device Prior Function Level of Independence: Independent Communication Communication: No difficulties    Cognition  Cognition Arousal/Alertness: Awake/alert Behavior During Therapy: WFL for tasks assessed/performed Overall Cognitive Status: Within Functional Limits for tasks assessed    Extremity/Trunk Assessment Lower Extremity Assessment Lower Extremity Assessment: Generalized weakness Cervical / Trunk Assessment Cervical / Trunk Assessment: Normal   Balance Dynamic Standing Balance Dynamic Standing - Balance Support: During functional activity Dynamic Standing - Level of Assistance: 4: Min assist  End of Session PT - End of Session Equipment Utilized During Treatment: Gait belt Activity Tolerance: Patient tolerated treatment well Patient left: in chair;with chair alarm set;with nursing/sitter in room;with call bell/phone  within reach Nurse Communication: Mobility status  GP     Roberto Reilly 04/30/2013, 12:06 PM

## 2013-04-30 NOTE — Progress Notes (Signed)
INITIAL NUTRITION ASSESSMENT  DOCUMENTATION CODES Per approved criteria  -Not Applicable   INTERVENTION: Provide [employer] Instant Breakfast BID Encourage PO intake  NUTRITION DIAGNOSIS: Inadequate oral intake related to difficulty preparing food as evidenced by 7% weight loss in less than 2 months.   Goal: Pt to meet >/= 90% of their estimated nutrition needs   Monitor:  PO intake Weight Labs  Reason for Assessment: Consult/MST  79 y.o. male  Admitting Dx: <principal problem not specified>  ASSESSMENT: 79 y.o. male PMHx COPD with emphysema, atrial fibrillation on chronic anticoagulant (no cardiologist), CABG x4, HLD, scabies (last treated 2 weeks ago).S/P RLL lobectomy approximately 2000. He is reported positive increased SOB, positive productive cough (green), negative fever, positive chills.   Pt reported 8 to 10 lbs weight loss in the past 2 weeks (3.5% weight loss) and weight history shows 17 lbs weight loss within the past 2 months (7.3% weight loss-significant). Pt states that him and his wife have a tight budget and have difficulty preparing food, especially healthy food. Pt states his appetite has been good most days and he usually eats 2 meals daily (skips breakfast). Pt ate 100% of his lunch today. Discussed healthy weight loss with pt and the importance of getting adequate nutrition. Provide "General Healthful Nutrition therapy" handout and a variety of sample menus from the Academy of [employer]. Menus include easy to prepare and affordable foods. Encouraged pt to snack more often if he continues to rapidly lose weight; encouraged protein-rich foods and encouraged pt to drink [employer] if he skips a meal.   Nutrition Focused Physical Exam:  Subcutaneous Fat:  Orbital Region: mild wasting Upper Arm Region: mild wasting Thoracic and Lumbar Region: NA  Muscle:  Temple Region: mild wasting Clavicle Bone Region: wnl Clavicle and  Acromion Bone Region: wnl Scapular Bone Region: wnl Dorsal Hand: mild wasting Patellar Region: mild wasting Anterior Thigh Region: mild wasting Posterior Calf Region: wnl  Edema: none  Height: Ht Readings from Last 1 Encounters:  04/29/13 6' (1.829 m)    Weight: Wt Readings from Last 1 Encounters:  04/29/13 218 lb 12.8 oz (99.247 kg)    Ideal Body Weight: 178 lbs  % Ideal Body Weight: 122%  Wt Readings from Last 10 Encounters:  04/29/13 218 lb 12.8 oz (99.247 kg)  03/12/13 235 lb 3.2 oz (106.686 kg)  01/08/13 234 lb (106.142 kg)  09/06/12 241 lb (109.317 kg)  03/08/12 249 lb 6.4 oz (113.127 kg)  09/01/11 248 lb 12.8 oz (112.855 kg)  01/06/11 252 lb 12.8 oz (114.669 kg)  12/06/10 254 lb 9.6 oz (115.486 kg)  01/11/10 254 lb (115.214 kg)  07/13/09 269 lb 6.1 oz (122.191 kg)    Usual Body Weight: 250 lbs  % Usual Body Weight: 87%  BMI:  Body mass index is 29.67 kg/(m^2).  Estimated Nutritional Needs: Kcal: 2000-2200 Protein: 130-140 grams Fluid: 2-2.2 L/day  Skin: WDL  Diet Order: Cardiac  EDUCATION NEEDS: -Education needs addressed   Intake/Output Summary (Last 24 hours) at 04/30/13 1257 Last data filed at 04/30/13 1237  Gross per 24 hour  Intake 1041.25 ml  Output   1615 ml  Net -573.75 ml    Last BM: PTA  Labs:   Recent Labs Lab 04/29/13 1130 04/29/13 1716 04/30/13 0537  NA 137  --  138  K 4.2  --  5.5*  CL 95*  --  99  CO2 33*  --  29  BUN 31*  --  31*  CREATININE 1.20  --  1.25  CALCIUM 9.5  --  9.2  MG  --  2.5 2.7*  GLUCOSE 108*  --  133*    CBG (last 3)  No results found for this basename: GLUCAP,  in the last 72 hours  Scheduled Meds: . albuterol  2.5 mg Nebulization TID  . aspirin EC  81 mg Oral Daily  . atorvastatin  40 mg Oral q1800  . cefTRIAXone (ROCEPHIN)  IV  1 g Intravenous Q24H  . cholecalciferol  1,000 Units Oral Daily  . clobetasol cream   Topical Daily  . dextromethorphan-guaiFENesin  1 tablet Oral BID  .  FLUoxetine  10 mg Oral Daily  . furosemide  40 mg Oral Daily  . ipratropium  0.5 mg Nebulization TID  . oxymetazoline  1 spray Each Nare BID  . [START ON 05/01/2013] predniSONE  60 mg Oral Q breakfast  . Warfarin - Pharmacist Dosing Inpatient   Does not apply q1800  . zolpidem  5 mg Oral QHS    Continuous Infusions: . sodium chloride 20 mL/hr (04/30/13 1119)    Past Medical History  Diagnosis Date  . History of MI (myocardial infarction) March 2007    Cath showed multivessel disease, referred for CABG  . CAD in native artery March 2007    Cath for dyspnea on exertion: 75% distal LM, 85% RI, 95% mid-distal Cx, in multiple RCA lesions with 95% distal. --> Referred for CABG  . S/P CABG x 24 July 2005    LIMA-LAD, SVG-OM, seq SVG-PDA -PLB  . Atrial fibrillation, chronic     Anticoagulated on warfarin. Stable -- post op  . H/O echocardiogram March 2007    Normal LV size and cavity appeared normal function. Grade 1 diastolic dysfunction. Limited study.  . Obesity (BMI 30-39.9)     One year ago weighed 265 pounds, (12/2012) -- now 234 pounds  . Dyslipidemia, goal LDL below 70   . COPD (chronic obstructive pulmonary disease) with emphysema 05/23/2007    Hosp 5/29-6/01/12- COPD exacerbation ONOX 12/08/10- desaturated to less than 88% for over an hour, qualifying for home O2 during sleep    . Hypoxia      chronic, on home O2  . History of pneumonia   . Seasonal allergies     Past Surgical History  Procedure Laterality Date  . Cataract extraction      Laser  . Rll resection for hamartoma    . Rul for hamartoma    . Coronary artery bypass graft    . Cardiac catheterization  03 28 2007    NORMAL LV FUNCTION/ ABDOMINAL AORTA STENOSIS,75%-85%. RIGHT FEMORAL ARTERY :CATHETERS USED A  4-FRENCH WITH A 4-FRENCH SHEATH    Ian Malkin RD, LDN Inpatient Clinical Dietitian Pager: [phone] After Hours Pager: [phone]

## 2013-05-01 LAB — MAGNESIUM: Magnesium: 2.5 mg/dL (ref 1.5–2.5)

## 2013-05-01 LAB — COMPREHENSIVE METABOLIC PANEL
ALT: 17 U/L (ref 0–53)
AST: 13 U/L (ref 0–37)
Albumin: 3.1 g/dL — ABNORMAL LOW (ref 3.5–5.2)
Alkaline Phosphatase: 40 U/L (ref 39–117)
BUN: 30 mg/dL — ABNORMAL HIGH (ref 6–23)
GFR calc Af Amer: 67 mL/min — ABNORMAL LOW (ref >90)
GFR calc non Af Amer: 58 mL/min — ABNORMAL LOW (ref >90)
Potassium: 4.9 mEq/L (ref 3.5–5.1)
Sodium: 135 mEq/L (ref 135–145)
Total Bilirubin: 0.6 mg/dL (ref 0.3–1.2)
Total Protein: 5.7 g/dL — ABNORMAL LOW (ref 6.0–8.3)

## 2013-05-01 LAB — CBC WITH DIFFERENTIAL/PLATELET
Basophils Relative: 0 % (ref 0–1)
Eosinophils Absolute: 0 10*3/uL (ref 0.0–0.7)
HCT: 47.7 % (ref 39.0–52.0)
Lymphocytes Relative: 15 % (ref 12–46)
Lymphs Abs: 2.1 10*3/uL (ref 0.7–4.0)
MCH: 29.9 pg (ref 26.0–34.0)
MCHC: 34.2 g/dL (ref 30.0–36.0)
MCV: 87.5 fL (ref 78.0–100.0)
Monocytes Relative: 9 % (ref 3–12)
Neutro Abs: 10.9 10*3/uL — ABNORMAL HIGH (ref 1.7–7.7)
Neutrophils Relative %: 76 % (ref 43–77)
Platelets: 138 10*3/uL — ABNORMAL LOW (ref 150–400)
RBC: 5.45 MIL/uL (ref 4.22–5.81)

## 2013-05-01 LAB — PROTIME-INR
INR: 2.81 — ABNORMAL HIGH (ref 0.00–1.49)
Prothrombin Time: 28.6 seconds — ABNORMAL HIGH (ref 11.6–15.2)

## 2013-05-01 LAB — EXPECTORATED SPUTUM ASSESSMENT W GRAM STAIN, RFLX TO RESP C

## 2013-05-01 MED ORDER — AMLODIPINE BESYLATE 10 MG PO TABS: 10.0000 mg | ORAL_TABLET | Freq: Every day | ORAL | Status: DC

## 2013-05-01 MED ORDER — PREDNISONE 50 MG PO TABS: ORAL_TABLET | ORAL | Status: DC

## 2013-05-01 MED ORDER — WARFARIN SODIUM 2.5 MG PO TABS
2.5000 mg | ORAL_TABLET | Freq: Once | ORAL | Status: DC
  Filled 2013-05-01: qty 1

## 2013-05-01 MED ORDER — AZITHROMYCIN 250 MG PO TABS: 250.0000 mg | ORAL_TABLET | Freq: Every day | ORAL | Status: DC

## 2013-05-01 NOTE — Discharge Summary (Addendum)
Physician Discharge Summary  Roberto Reilly ZOX:[id number] DOB: 1934/02/16 DOA: 04/29/2013  PCP: Juline Patch, MD  Admit date: 04/29/2013 Discharge date: 05/01/2013  Recommendations for Outpatient Follow-up:  1. Pt will need to follow up with PCP in 2-3 weeks post discharge 2. Please obtain BMP to evaluate electrolytes and kidney function 3. Please also check CBC to evaluate Hg and Hct levels 4. Please note that the sotalol was discontinued due to bradycardia 5. Patient was started on Norvasc for better blood pressure control 6. Patient will need to followup with cardiologist and he was made aware to schedule an appointment as soon as possible 7. Patient was also discharged on Zithromax to complete therapy for 5 more days along with 5 days of prednisone tapering 8. Please note that patient has oxygen at home  Discharge Diagnoses: COPD with emphysema Active Problems:   COPD (chronic obstructive pulmonary disease) with emphysema   Long term (current) use of anticoagulants   CAD in native artery - LM, RCA, RI & Cx disease --> CABG x 4 (LIMA-LAD, SVG-OM/RI, SVG-rPDA-rPL)   S/P CABG x 4   Atrial fibrillation, chronic / persistent   Dyslipidemia, goal LDL below 70   COPD exacerbation   Hx of scabies   HLD (hyperlipidemia)   HTN (hypertension)  Discharge Condition: Stable  Diet recommendation: Heart healthy diet discussed in details   HPI: Roberto Reilly is a 79 y.o. Male with history of COPD s/p RLL lobectomy approximately 2000 on 2L home oxygen prn, atrial fibrillation on chronic anticoagulant followed by [facility], CABG x4, HLD, recent scabies. Followed by Dr. Parks Ranger at [facility] for unknown malignancy. Presented with SOB consistent with COPD exacerbation. On antibiotics and steroids and duonebs. Was bradycardic and having pauses on telemetry overnight so stopped sotalol per cardiology recommendations and monitoring on telemetry overnight.   Assessment/Plan  Acute on chronic respiratory failure  secondary to advanced COPD requiring oxygen at home COPD exacerbation, improving. Has home oxygen.  - Pt was treated with IV steroids with successful transition to oral Prednisone - Pt will continue Zithromax for 5 more days post discharge as he wants to go home today  - Continue duonebs  Atrial fibrillation - cardiology rec's stopping sotalol altogether and not replacing it with any BB or CCB at this time.  - Close follow up in clinic  - no events on telemetry over 24 hours  - Coumadin per pharmacy  S/P CABG, chest pain free.  -holding BB per cardiology recommendations due to pauses and bradycardia  - continue asa and statin  HTN,  - d/c sotalol HLD, LDL not at goal < 70  Lab Results   Component  Value  Date    CHOL  181  04/30/2013    HDL  43  04/30/2013    LDLCALC  121*  04/30/2013    TRIG  86  04/30/2013    CHOLHDL  4.2  04/30/2013    - continue statin  Depression and anxiety  - Stable   Code Status: FULL  Family Communication: patient alone   Consultants:  [facility] by phone Procedures:  CXR Antibiotics:  Discharge on Zithromax for 5 more days post discharge   Discharge Exam: Filed Vitals:   05/01/13 0513  BP: 167/86  Pulse: 52  Temp: 97.8 F (36.6 C)  Resp: 16   Filed Vitals:   04/30/13 2044 04/30/13 2116 05/01/13 0513 05/01/13 0844  BP:  138/81 167/86   Pulse:  54 52   Temp:  98.1 F (36.7 C) 97.8  F (36.6 C)   TempSrc:  Oral Oral   Resp:  24 16   Height:      Weight:      SpO2: 91% 93% 91% 94%    General: Pt is alert, follows commands appropriately, not in acute distress Cardiovascular: Regular rate and rhythm, S1/S2 +, no murmurs, no rubs, no gallops Respiratory: Clear to auscultation bilaterally, no wheezing, no crackles, no rhonchi Abdominal: Soft, non tender, non distended, bowel sounds +, no guarding Extremities: no edema, no cyanosis, pulses palpable bilaterally DP and PT Neuro: Grossly nonfocal  Discharge Instructions  Discharge Orders    Future Appointments Provider Department Dept Phone   05/24/2013 11:00 AM Marykay Lex, MD [facility] [phone]   08/19/2013 2:15 PM Sherrie George, MD [facility] [phone]   09/10/2013 11:00 AM Waymon Budge, MD  [facility] [phone]   Future Orders Complete By Expires   Diet - low sodium heart healthy  As directed    Increase activity slowly  As directed        Medication List    STOP taking these medications       sotalol 80 MG tablet  Commonly known as:  BETAPACE      TAKE these medications       albuterol 108 (90 BASE) MCG/ACT inhaler  Commonly known as:  PROVENTIL HFA;VENTOLIN HFA  Inhale 2 puffs into the lungs every 6 (six) hours as needed for wheezing or shortness of breath.     amLODipine 10 MG tablet  Commonly known as:  NORVASC  Take 1 tablet (10 mg total) by mouth daily.     aspirin 81 MG tablet  Take 81 mg by mouth daily.     azithromycin 250 MG tablet  Commonly known as:  ZITHROMAX  Take 1 tablet (250 mg total) by mouth daily.     betamethasone dipropionate 0.05 % cream  Commonly known as:  DIPROLENE  Apply 1 application topically daily.     budesonide-formoterol 160-4.5 MCG/ACT inhaler  Commonly known as:  SYMBICORT  Inhale 2 puffs into the lungs 2 (two) times daily.     cholecalciferol 1000 UNITS tablet  Commonly known as:  VITAMIN D  Take 1,000 Units by mouth daily.     DUONEB 0.5-2.5 (3) MG/3ML Soln  Generic drug:  ipratropium-albuterol  Take 3 mLs by nebulization every 6 (six) hours as needed (wheezing and shortness of breath).     Fluticasone Furoate-Vilanterol 100-25 MCG/INH Aepb  Commonly known as:  BREO ELLIPTA  Inhale 1 puff into the lungs daily.     furosemide 40 MG tablet  Commonly known as:  LASIX  Take 40 mg by mouth daily.     hydrOXYzine 25 MG tablet  Commonly known as:  ATARAX/VISTARIL  Take 25 mg by mouth 2 (two) times daily.     permethrin 5 % cream  Commonly  known as:  ELIMITE  Apply 1 application topically 2 (two) times daily.     potassium chloride SA 20 MEQ tablet  Commonly known as:  K-DUR,KLOR-CON  Take 20 mEq by mouth daily.     predniSONE 50 MG tablet  Commonly known as:  DELTASONE  Take 50 mg tablet today, and taper down by 10 mg daily until completed     simvastatin 40 MG tablet  Commonly known as:  ZOCOR  Take 40 mg by mouth every evening.     warfarin 2.5 MG tablet  Commonly known  as:  COUMADIN  Take 1 tablet (2.5 mg total) by mouth daily.     zolpidem 10 MG tablet  Commonly known as:  AMBIEN  Take 5 mg by mouth at bedtime.           Follow-up Information   Follow up with HARDING,DAVID W, MD In 2 weeks. (the office will contact you with an appointment)    Specialty:  Cardiology   Contact information:   [address] [place] [address] [phone]       Follow up with Juline Patch, MD In 2 weeks.   Specialty:  Internal Medicine   Contact information:   [address], [address] [place] [address] [phone]       Follow up with Debbora Presto, MD. (As needed if symptoms worsen call my cell phone [phone])    Specialty:  Internal Medicine   Contact information:   [address] [place] [address] [phone]        The results of significant diagnostics from this hospitalization (including imaging, microbiology, ancillary and laboratory) are listed below for reference.     Microbiology: Recent Results (from the past 240 hour(s))  CULTURE, EXPECTORATED SPUTUM-ASSESSMENT     Status: None   Collection Time    05/01/13 11:49 AM      Result Value Range Status   Specimen Description SPUTUM   Final   Special Requests NONE   Final   Sputum evaluation     Final   Value: THIS SPECIMEN IS ACCEPTABLE. RESPIRATORY CULTURE REPORT TO FOLLOW.   Report Status 05/01/2013 FINAL   Final     Labs: Basic Metabolic Panel:  Recent Labs Lab 04/29/13 1130  04/29/13 1716 04/30/13 0537 05/01/13 0605  NA 137  --  138 135  K 4.2  --  5.5* 4.9  CL 95*  --  99 99  CO2 33*  --  29 28  GLUCOSE 108*  --  133* 106*  BUN 31*  --  31* 30*  CREATININE 1.20  --  1.25 1.18  CALCIUM 9.5  --  9.2 8.7  MG  --  2.5 2.7* 2.5   Liver Function Tests:  Recent Labs Lab 04/30/13 0537 05/01/13 0605  AST 14 13  ALT 21 17  ALKPHOS 48 40  BILITOT 0.6 0.6  PROT 6.3 5.7*  ALBUMIN 3.5 3.1*   CBC:  Recent Labs Lab 04/29/13 1130 04/30/13 0537 05/01/13 0605  WBC 12.4* 12.7* 14.3*  NEUTROABS 9.4* 10.3* 10.9*  HGB 17.7* 16.9 16.3  HCT 51.7 50.0 47.7  MCV 88.4 88.0 87.5  PLT 164 148* 138*   Cardiac Enzymes:  Recent Labs Lab 04/29/13 1718 04/29/13 2226 04/30/13 0537  TROPONINI <0.30 <0.30 <0.30   BNP: BNP (last 3 results)  Recent Labs  04/29/13 1716  PROBNP 445.2   SIGNED: Time coordinating discharge: Over 30 minutes  Debbora Presto, MD  [facility] 05/01/2013, 1:06 PM Pager [phone]  If 7PM-7AM, please contact night-coverage [email or web address] Password [email or web address]

## 2013-05-01 NOTE — Progress Notes (Addendum)
Physical Therapy Treatment Patient Details Name: Roberto Reilly MRN: [id number] DOB: 11/01/1933 Today's Date: 05/01/2013 Time: 1012-1025 PT Time Calculation (min): 13 min  PT Assessment / Plan / Recommendation  History of Present Illness Roberto Reilly is a 79 y.o. male PMHx COPD with emphysema, atrial fibrillation on chronic anticoagulant (no cardiologist), CABG x4, HLD, scabies (last treated 2 weeks ago).S/P RLL lobectomy approximately 2000 I. Dr. Parks Ranger at [facility] (secondary to unknown malignancy). Date starting last Wednesday began to have increasing shortness of breath NOTE patient on home O2 at 2 L via [place] unable to get appt w/ Dr Ricki Miller until today. He is reported positive increased SOB, positive productive cough (green), negative fever, positive chills.   Denies hemoptysis. No abdominal pain. No chest pain. Positive DOE.  Attempt his inhalers at home without improvement in his symptoms. Dr. Ricki Miller today patient with 2 nebulized treatments without improvement in his symptoms. Since the ER for evaluation. Patient reports continuing mild to moderate ongoing shortness of breath. Currently on 2 L O2 via [place] and while sitting still feels negative SOB.   PT Comments   Progressing well with mobility. May benefit from HHPT for balance training, if pt agreeable. Possible d/c home today per pt.   Follow Up Recommendations  Home health PT;Supervision - Intermittent (if pt agreeable)     Does the patient have the potential to tolerate intense rehabilitation     Barriers to Discharge        Equipment Recommendations   (pt declines walker use)    Recommendations for Other Services OT consult  Frequency Min 3X/week   Progress towards PT Goals Progress towards PT goals: Progressing toward goals  Plan Current plan remains appropriate    Precautions / Restrictions Precautions Precautions: Fall Precaution Comments: chair alarm Restrictions Weight Bearing Restrictions: No   Pertinent Vitals/Pain O2  sats 86% on RA at lowest during ambulation. However, sats return to >90% on RA with brief rest and pursed lip breathing.     Mobility  Bed Mobility Bed Mobility: Not assessed Transfers Transfers: Sit to Stand;Stand to Sit Sit to Stand: 6: Modified independent (Device/Increase time);From chair/3-in-1 Stand to Sit: 6: Modified independent (Device/Increase time);To chair/3-in-1 Ambulation/Gait Ambulation/Gait Assistance: 4: Min guard Ambulation Distance (Feet): 350 Feet Ambulation/Gait Assistance Details: close guard for safety. good gait speed. No LOB but pt still held onto IV pole. Declined walker. Dyspnea 2/4. O2 sats dropped to 86% on RA (during first half of walk) but returned to >90% with pursed lip breathing and brief standing rest break. Monitor read 94% on RA during walk back to room.  Gait Pattern: Step-through pattern    Exercises     PT Diagnosis:    PT Problem List:   PT Treatment Interventions:     PT Goals (current goals can now be found in the care plan section)    Visit Information  Last PT Received On: 05/01/13 History of Present Illness: Roberto Reilly is a 79 y.o. male PMHx COPD with emphysema, atrial fibrillation on chronic anticoagulant (no cardiologist), CABG x4, HLD, scabies (last treated 2 weeks ago).S/P RLL lobectomy approximately 2000 I. Dr. Parks Ranger at [facility] (secondary to unknown malignancy). Date starting last Wednesday began to have increasing shortness of breath NOTE patient on home O2 at 2 L via [place] unable to get appt w/ Dr Ricki Miller until today. He is reported positive increased SOB, positive productive cough (green), negative fever, positive chills.   Denies hemoptysis. No abdominal pain. No chest pain. Positive DOE.  Attempt his inhalers at home without improvement in his symptoms. Dr. Ricki Miller today patient with 2 nebulized treatments without improvement in his symptoms. Since the ER for evaluation. Patient reports continuing mild to moderate ongoing shortness of  breath. Currently on 2 L O2 via [place] and while sitting still feels negative SOB.    Subjective Data      Cognition  Cognition Arousal/Alertness: Awake/alert Behavior During Therapy: WFL for tasks assessed/performed Overall Cognitive Status: Within Functional Limits for tasks assessed    Balance  Dynamic Standing Balance Dynamic Standing - Balance Support: During functional activity Dynamic Standing - Level of Assistance: 5: Stand by assistance  End of Session PT - End of Session Equipment Utilized During Treatment: Gait belt Activity Tolerance: Patient tolerated treatment well Patient left: in chair;with call bell/phone within reach;with chair alarm set   GP     Rebeca Alert, MPT Pager: [phone]

## 2013-05-01 NOTE — Progress Notes (Signed)
Continues go in and out of A-Fib with pauses typically slightly over 2 seconds.   rate during sleep is 40's to 70's, NAD will continue to monitor

## 2013-05-01 NOTE — Progress Notes (Signed)
ANTICOAGULATION CONSULT NOTE - Follow Up Consult  Pharmacy Consult for Warfarin Indication: atrial fibrillation  No Known Allergies  Labs:  Recent Labs  04/29/13 1130 04/29/13 1718 04/29/13 2226 04/30/13 0537 05/01/13 0605  HGB 17.7*  --   --  16.9 16.3  HCT 51.7  --   --  50.0 47.7  PLT 164  --   --  148* 138*  LABPROT 29.3*  --   --  33.6* 28.6*  INR 2.90*  --   --  3.47* 2.81*  CREATININE 1.20  --   --  1.25 1.18  TROPONINI  --  <0.30 <0.30 <0.30  --      Assessment: 79 yo M on chronic anticoagulation PTA with warfarin for h/o atrial fibrillation. Home dose was reportedly 2.5 mg daily with last dose on 12/8.  Pt admitted 12/8 for suspected bronchitis associated with AECOPD.  INR was therapeutic on admission at 2.9.  Coumadin was not given on day of admit but INR trended to 3.47 yesterday. Coumadin held last night, INR 2.81 today. Plts further down to 148K, H/H okay, no bleeding reported. Levaquin stopped 12/9.   Pt with good appetite on a cardiac diet. Plan discharge today.  Goal of Therapy:  INR 2-3 Monitor platelets by anticoagulation protocol: Yes   Plan:   Coumadin 2.5mg  po x 1 tonight  If discharge, please resume home regimen of 2.5mg  po daily  Daily PT/INR  Pharmacy will f/u  Geoffry Paradise, PharmD, BCPS Pager: [phone] 11:21 AM Pharmacy #: [id number]

## 2013-05-01 NOTE — Progress Notes (Signed)
Occupational Therapy Treatment Patient Details Name: Roberto Reilly MRN: [id number] DOB: 08-Mar-1934 Today's Date: 05/01/2013 Time: 4098-1191 OT Time Calculation (min): 17 min  OT Assessment / Plan / Recommendation  History of present illness Roberto Reilly is a 79 y.o. male PMHx COPD with emphysema, atrial fibrillation on chronic anticoagulant (no cardiologist), CABG x4, HLD, scabies (last treated 2 weeks ago).S/P RLL lobectomy approximately 2000 I. Dr. Parks Ranger at [facility] (secondary to unknown malignancy). Date starting last Wednesday began to have increasing shortness of breath NOTE patient on home O2 at 2 L via [place] unable to get appt w/ Dr Ricki Miller until today. He is reported positive increased SOB, positive productive cough (green), negative fever, positive chills.   Denies hemoptysis. No abdominal pain. No chest pain. Positive DOE.  Attempt his inhalers at home without improvement in his symptoms. Dr. Ricki Miller today patient with 2 nebulized treatments without improvement in his symptoms. Since the ER for evaluation. Patient reports continuing mild to moderate ongoing shortness of breath. Currently on 2 L O2 via [place] and while sitting still feels negative SOB.   OT comments  Pt doing well. All educated completed. No further OT needs.  Follow Up Recommendations  No OT follow up    Barriers to Discharge       Equipment Recommendations   (pt declines DME)    Recommendations for Other Services    Frequency     Progress towards OT Goals Progress towards OT goals: Goals met/education completed, patient discharged from OT  Plan Discharge plan remains appropriate    Precautions / Restrictions Precautions Precautions: Fall Restrictions Weight Bearing Restrictions: No   Pertinent Vitals/Pain 92% on RA with activity up to 93% with rest and PLB    ADL  Toilet Transfer: Performed;Supervision/safety Toilet Transfer Method: Sit to Barista: Comfort height toilet;Grab bars ADL  Comments: Pt retrieved clothing from closet with supevision level. Sats on RA with activity 92% and up to 93% with PLB. Reviewed energy conservation techniques and PLB. Pt states he has a vanity next to toilet at home and he did well with toilet transfer today. No further OT needs.     OT Diagnosis:    OT Problem List:   OT Treatment Interventions:     OT Goals(current goals can now be found in the care plan section)    Visit Information  Last OT Received On: 05/01/13 History of Present Illness: Roberto Reilly is a 79 y.o. male PMHx COPD with emphysema, atrial fibrillation on chronic anticoagulant (no cardiologist), CABG x4, HLD, scabies (last treated 2 weeks ago).S/P RLL lobectomy approximately 2000 I. Dr. Parks Ranger at [facility] (secondary to unknown malignancy). Date starting last Wednesday began to have increasing shortness of breath NOTE patient on home O2 at 2 L via [place] unable to get appt w/ Dr Ricki Miller until today. He is reported positive increased SOB, positive productive cough (green), negative fever, positive chills.   Denies hemoptysis. No abdominal pain. No chest pain. Positive DOE.  Attempt his inhalers at home without improvement in his symptoms. Dr. Ricki Miller today patient with 2 nebulized treatments without improvement in his symptoms. Since the ER for evaluation. Patient reports continuing mild to moderate ongoing shortness of breath. Currently on 2 L O2 via [place] and while sitting still feels negative SOB.    Subjective Data      Prior Functioning       Cognition  Cognition Arousal/Alertness: Awake/alert Behavior During Therapy: WFL for tasks assessed/performed Overall Cognitive Status: Within Functional  Limits for tasks assessed    Mobility  Transfers Transfers: Sit to Stand;Stand to Sit Sit to Stand: 5: Supervision;From chair/3-in-1;From toilet Stand to Sit: 5: Supervision;To chair/3-in-1;To toilet    Exercises      Balance Dynamic Standing Balance Dynamic Standing - Balance  Support: During functional activity Dynamic Standing - Level of Assistance: 5: Stand by assistance   End of Session OT - End of Session Activity Tolerance: Patient tolerated treatment well Patient left: in chair;with call bell/phone within reach;with chair alarm set  GO     Lennox Laity [phone] 05/01/2013, 9:29 AM

## 2013-05-03 LAB — CULTURE, RESPIRATORY

## 2013-05-07 ENCOUNTER — Encounter (HOSPITAL_COMMUNITY): Payer: Self-pay | Admitting: Emergency Medicine

## 2013-05-07 ENCOUNTER — Emergency Department (HOSPITAL_COMMUNITY): Payer: Medicare Other

## 2013-05-07 ENCOUNTER — Inpatient Hospital Stay (HOSPITAL_COMMUNITY)
Admission: EM | Admit: 2013-05-07 | Discharge: 2013-05-09 | DRG: 190 | Disposition: A | Discharge status: Home / Self Care | Payer: Medicare Other | Attending: Internal Medicine | Admitting: Internal Medicine

## 2013-05-07 DIAGNOSIS — Z6829 Body mass index (BMI) 29.0-29.9, adult: Secondary | ICD-10-CM

## 2013-05-07 DIAGNOSIS — I498 Other specified cardiac arrhythmias: Secondary | ICD-10-CM | POA: Diagnosis present

## 2013-05-07 DIAGNOSIS — I2510 Atherosclerotic heart disease of native coronary artery without angina pectoris: Secondary | ICD-10-CM | POA: Diagnosis present

## 2013-05-07 DIAGNOSIS — F329 Major depressive disorder, single episode, unspecified: Secondary | ICD-10-CM | POA: Diagnosis not present

## 2013-05-07 DIAGNOSIS — I252 Old myocardial infarction: Secondary | ICD-10-CM

## 2013-05-07 DIAGNOSIS — R443 Hallucinations, unspecified: Secondary | ICD-10-CM | POA: Diagnosis present

## 2013-05-07 DIAGNOSIS — IMO0002 Reserved for concepts with insufficient information to code with codable children: Secondary | ICD-10-CM

## 2013-05-07 DIAGNOSIS — J9620 Acute and chronic respiratory failure, unspecified whether with hypoxia or hypercapnia: Secondary | ICD-10-CM | POA: Diagnosis present

## 2013-05-07 DIAGNOSIS — Z7901 Long term (current) use of anticoagulants: Secondary | ICD-10-CM

## 2013-05-07 DIAGNOSIS — R05 Cough: Secondary | ICD-10-CM | POA: Diagnosis not present

## 2013-05-07 DIAGNOSIS — I4891 Unspecified atrial fibrillation: Secondary | ICD-10-CM | POA: Diagnosis not present

## 2013-05-07 DIAGNOSIS — J441 Chronic obstructive pulmonary disease with (acute) exacerbation: Principal | ICD-10-CM | POA: Diagnosis present

## 2013-05-07 DIAGNOSIS — R079 Chest pain, unspecified: Secondary | ICD-10-CM

## 2013-05-07 DIAGNOSIS — F29 Unspecified psychosis not due to a substance or known physiological condition: Secondary | ICD-10-CM | POA: Diagnosis not present

## 2013-05-07 DIAGNOSIS — Z8249 Family history of ischemic heart disease and other diseases of the circulatory system: Secondary | ICD-10-CM | POA: Diagnosis not present

## 2013-05-07 DIAGNOSIS — E669 Obesity, unspecified: Secondary | ICD-10-CM

## 2013-05-07 DIAGNOSIS — I480 Paroxysmal atrial fibrillation: Secondary | ICD-10-CM | POA: Diagnosis present

## 2013-05-07 DIAGNOSIS — Z9981 Dependence on supplemental oxygen: Secondary | ICD-10-CM

## 2013-05-07 DIAGNOSIS — Z951 Presence of aortocoronary bypass graft: Secondary | ICD-10-CM | POA: Diagnosis not present

## 2013-05-07 DIAGNOSIS — E785 Hyperlipidemia, unspecified: Secondary | ICD-10-CM | POA: Diagnosis present

## 2013-05-07 DIAGNOSIS — Z79899 Other long term (current) drug therapy: Secondary | ICD-10-CM | POA: Diagnosis not present

## 2013-05-07 DIAGNOSIS — Z8701 Personal history of pneumonia (recurrent): Secondary | ICD-10-CM

## 2013-05-07 DIAGNOSIS — Z8619 Personal history of other infectious and parasitic diseases: Secondary | ICD-10-CM

## 2013-05-07 DIAGNOSIS — F419 Anxiety disorder, unspecified: Secondary | ICD-10-CM

## 2013-05-07 DIAGNOSIS — F411 Generalized anxiety disorder: Secondary | ICD-10-CM | POA: Diagnosis not present

## 2013-05-07 DIAGNOSIS — J449 Chronic obstructive pulmonary disease, unspecified: Secondary | ICD-10-CM | POA: Diagnosis present

## 2013-05-07 DIAGNOSIS — Z7982 Long term (current) use of aspirin: Secondary | ICD-10-CM | POA: Diagnosis not present

## 2013-05-07 DIAGNOSIS — Z87891 Personal history of nicotine dependence: Secondary | ICD-10-CM | POA: Diagnosis not present

## 2013-05-07 DIAGNOSIS — I482 Chronic atrial fibrillation, unspecified: Secondary | ICD-10-CM

## 2013-05-07 DIAGNOSIS — R0789 Other chest pain: Secondary | ICD-10-CM | POA: Diagnosis not present

## 2013-05-07 DIAGNOSIS — R440 Auditory hallucinations: Secondary | ICD-10-CM

## 2013-05-07 DIAGNOSIS — R059 Cough, unspecified: Secondary | ICD-10-CM | POA: Diagnosis not present

## 2013-05-07 DIAGNOSIS — I10 Essential (primary) hypertension: Secondary | ICD-10-CM

## 2013-05-07 DIAGNOSIS — J439 Emphysema, unspecified: Secondary | ICD-10-CM

## 2013-05-07 DIAGNOSIS — R0602 Shortness of breath: Secondary | ICD-10-CM | POA: Diagnosis not present

## 2013-05-07 DIAGNOSIS — F3289 Other specified depressive episodes: Secondary | ICD-10-CM | POA: Diagnosis present

## 2013-05-07 LAB — CBC WITH DIFFERENTIAL/PLATELET
Basophils Absolute: 0 10*3/uL (ref 0.0–0.1)
Basophils Relative: 0 % (ref 0–1)
Lymphs Abs: 1.5 10*3/uL (ref 0.7–4.0)
MCHC: 33.5 g/dL (ref 30.0–36.0)
MCV: 89.1 fL (ref 78.0–100.0)
Neutro Abs: 10.3 10*3/uL — ABNORMAL HIGH (ref 1.7–7.7)
Neutrophils Relative %: 78 % — ABNORMAL HIGH (ref 43–77)
Platelets: 141 10*3/uL — ABNORMAL LOW (ref 150–400)
RDW: 14.2 % (ref 11.5–15.5)

## 2013-05-07 LAB — COMPREHENSIVE METABOLIC PANEL
ALT: 26 U/L (ref 0–53)
AST: 20 U/L (ref 0–37)
Albumin: 4 g/dL (ref 3.5–5.2)
Alkaline Phosphatase: 49 U/L (ref 39–117)
Chloride: 95 mEq/L — ABNORMAL LOW (ref 96–112)
Creatinine, Ser: 1.06 mg/dL (ref 0.50–1.35)
Glucose, Bld: 114 mg/dL — ABNORMAL HIGH (ref 70–99)
Potassium: 3.8 mEq/L (ref 3.5–5.1)
Total Bilirubin: 1.3 mg/dL — ABNORMAL HIGH (ref 0.3–1.2)

## 2013-05-07 LAB — TROPONIN I: Troponin I: <0.3 ng/mL (ref <0.30)

## 2013-05-07 LAB — PROTIME-INR
INR: 2.78 — ABNORMAL HIGH (ref 0.00–1.49)
Prothrombin Time: 28.4 seconds — ABNORMAL HIGH (ref 11.6–15.2)

## 2013-05-07 LAB — RAPID URINE DRUG SCREEN, HOSP PERFORMED
Amphetamines: NOT DETECTED
Barbiturates: NOT DETECTED
Opiates: NOT DETECTED
Tetrahydrocannabinol: NOT DETECTED

## 2013-05-07 LAB — D-DIMER, QUANTITATIVE (NOT AT ARMC): D-Dimer, Quant: 0.27 ug/mL-FEU (ref 0.00–0.48)

## 2013-05-07 LAB — PRO B NATRIURETIC PEPTIDE: Pro B Natriuretic peptide (BNP): 760.7 pg/mL — ABNORMAL HIGH (ref 0–450)

## 2013-05-07 LAB — PHOSPHORUS: Phosphorus: 3 mg/dL (ref 2.3–4.6)

## 2013-05-07 MED ORDER — AMLODIPINE BESYLATE 10 MG PO TABS
10.0000 mg | ORAL_TABLET | Freq: Every day | ORAL | Status: DC
  Administered 2013-05-08 – 2013-05-09 (×2): 10 mg via ORAL
  Filled 2013-05-07 (×2): qty 1

## 2013-05-07 MED ORDER — NITROGLYCERIN 0.4 MG SL SUBL: 0.4000 mg | SUBLINGUAL_TABLET | SUBLINGUAL | Status: DC | PRN

## 2013-05-07 MED ORDER — ASPIRIN EC 81 MG PO TBEC
81.0000 mg | DELAYED_RELEASE_TABLET | Freq: Every day | ORAL | Status: DC
  Administered 2013-05-08 – 2013-05-09 (×2): 81 mg via ORAL
  Filled 2013-05-07 (×2): qty 1

## 2013-05-07 MED ORDER — SIMVASTATIN 40 MG PO TABS: 40.0000 mg | ORAL_TABLET | Freq: Every evening | ORAL | Status: DC

## 2013-05-07 MED ORDER — SODIUM CHLORIDE 0.9 % IV SOLN
INTRAVENOUS | Status: DC
  Administered 2013-05-07: 12:00:00 via INTRAVENOUS

## 2013-05-07 MED ORDER — SODIUM CHLORIDE 0.9 % IJ SOLN
3.0000 mL | Freq: Two times a day (BID) | INTRAMUSCULAR | Status: DC
  Administered 2013-05-07 – 2013-05-08 (×2): 3 mL via INTRAVENOUS

## 2013-05-07 MED ORDER — METHYLPREDNISOLONE SODIUM SUCC 125 MG IJ SOLR
125.0000 mg | Freq: Once | INTRAMUSCULAR | Status: AC
  Administered 2013-05-07: 125 mg via INTRAVENOUS
  Filled 2013-05-07: qty 2

## 2013-05-07 MED ORDER — SODIUM CHLORIDE 0.9 % IV SOLN: 250.0000 mL | INTRAVENOUS | Status: DC | PRN

## 2013-05-07 MED ORDER — ATORVASTATIN CALCIUM 20 MG PO TABS
20.0000 mg | ORAL_TABLET | Freq: Every day | ORAL | Status: DC
  Administered 2013-05-08 – 2013-05-09 (×2): 20 mg via ORAL
  Filled 2013-05-07 (×3): qty 1

## 2013-05-07 MED ORDER — FUROSEMIDE 40 MG PO TABS
40.0000 mg | ORAL_TABLET | Freq: Every day | ORAL | Status: DC
  Administered 2013-05-08 – 2013-05-09 (×2): 40 mg via ORAL
  Filled 2013-05-07 (×2): qty 1

## 2013-05-07 MED ORDER — VITAMIN D3 25 MCG (1000 UNIT) PO TABS
1000.0000 [IU] | ORAL_TABLET | Freq: Every day | ORAL | Status: DC
  Administered 2013-05-08 – 2013-05-09 (×2): 1000 [IU] via ORAL
  Filled 2013-05-07 (×2): qty 1

## 2013-05-07 MED ORDER — LEVOFLOXACIN IN D5W 750 MG/150ML IV SOLN
750.0000 mg | INTRAVENOUS | Status: DC
  Administered 2013-05-07 – 2013-05-08 (×2): 750 mg via INTRAVENOUS
  Filled 2013-05-07 (×3): qty 150

## 2013-05-07 MED ORDER — METHYLPREDNISOLONE SODIUM SUCC 40 MG IJ SOLR
40.0000 mg | Freq: Two times a day (BID) | INTRAMUSCULAR | Status: DC
  Administered 2013-05-07 – 2013-05-09 (×4): 40 mg via INTRAVENOUS
  Filled 2013-05-07 (×6): qty 1

## 2013-05-07 MED ORDER — BUDESONIDE-FORMOTEROL FUMARATE 160-4.5 MCG/ACT IN AERO
2.0000 | INHALATION_SPRAY | Freq: Two times a day (BID) | RESPIRATORY_TRACT | Status: DC
  Administered 2013-05-07 – 2013-05-09 (×4): 2 via RESPIRATORY_TRACT
  Filled 2013-05-07: qty 6

## 2013-05-07 MED ORDER — SODIUM CHLORIDE 0.9 % IJ SOLN
3.0000 mL | Freq: Two times a day (BID) | INTRAMUSCULAR | Status: DC
  Administered 2013-05-08 (×2): 3 mL via INTRAVENOUS

## 2013-05-07 MED ORDER — SODIUM CHLORIDE 0.9 % IJ SOLN: 3.0000 mL | INTRAMUSCULAR | Status: DC | PRN

## 2013-05-07 MED ORDER — IPRATROPIUM BROMIDE 0.02 % IN SOLN
0.5000 mg | Freq: Once | RESPIRATORY_TRACT | Status: AC
  Administered 2013-05-07: 0.5 mg via RESPIRATORY_TRACT
  Filled 2013-05-07: qty 2.5

## 2013-05-07 MED ORDER — ALBUTEROL SULFATE (5 MG/ML) 0.5% IN NEBU: 2.5000 mg | INHALATION_SOLUTION | RESPIRATORY_TRACT | Status: DC | PRN

## 2013-05-07 MED ORDER — ZOLPIDEM TARTRATE 5 MG PO TABS
5.0000 mg | ORAL_TABLET | Freq: Every day | ORAL | Status: DC
  Administered 2013-05-07 – 2013-05-08 (×2): 5 mg via ORAL
  Filled 2013-05-07 (×2): qty 1

## 2013-05-07 MED ORDER — POTASSIUM CHLORIDE CRYS ER 20 MEQ PO TBCR
20.0000 meq | EXTENDED_RELEASE_TABLET | Freq: Every day | ORAL | Status: DC
  Administered 2013-05-08 – 2013-05-09 (×2): 20 meq via ORAL
  Filled 2013-05-07 (×2): qty 1

## 2013-05-07 MED ORDER — ALBUTEROL SULFATE (5 MG/ML) 0.5% IN NEBU
2.5000 mg | INHALATION_SOLUTION | Freq: Four times a day (QID) | RESPIRATORY_TRACT | Status: DC
  Administered 2013-05-07 – 2013-05-09 (×7): 2.5 mg via RESPIRATORY_TRACT
  Filled 2013-05-07 (×7): qty 0.5

## 2013-05-07 MED ORDER — TIOTROPIUM BROMIDE MONOHYDRATE 18 MCG IN CAPS
18.0000 ug | ORAL_CAPSULE | Freq: Every day | RESPIRATORY_TRACT | Status: DC
  Administered 2013-05-08: 09:00:00 18 ug via RESPIRATORY_TRACT
  Filled 2013-05-07: qty 5

## 2013-05-07 MED ORDER — ONDANSETRON HCL 4 MG PO TABS: 4.0000 mg | ORAL_TABLET | Freq: Four times a day (QID) | ORAL | Status: DC | PRN

## 2013-05-07 MED ORDER — ALBUTEROL SULFATE (5 MG/ML) 0.5% IN NEBU
5.0000 mg | INHALATION_SOLUTION | Freq: Once | RESPIRATORY_TRACT | Status: AC
  Administered 2013-05-07: 5 mg via RESPIRATORY_TRACT
  Filled 2013-05-07: qty 1

## 2013-05-07 MED ORDER — MORPHINE SULFATE 2 MG/ML IJ SOLN: 1.0000 mg | INTRAMUSCULAR | Status: DC | PRN

## 2013-05-07 MED ORDER — WARFARIN - PHARMACIST DOSING INPATIENT: Freq: Every day | Status: DC

## 2013-05-07 MED ORDER — ONDANSETRON HCL 4 MG/2ML IJ SOLN: 4.0000 mg | Freq: Four times a day (QID) | INTRAMUSCULAR | Status: DC | PRN

## 2013-05-07 NOTE — ED Notes (Signed)
Per EMS- Patient reports that he is having increased SOB. Patient has a history of COPD. Patient also is requesting medical clearance because he is hearing music in his head and reported that he is under a lot of stress. Patient is in atrial fibrillation.

## 2013-05-07 NOTE — Progress Notes (Signed)
Utilization Review completed.  Curtis Cain RN CM  

## 2013-05-07 NOTE — ED Provider Notes (Signed)
CSN: [id number]     Arrival date & time 05/07/13  1104 History   First MD Initiated Contact with Patient 05/07/13 1127     Chief Complaint  Patient presents with  . Medical Clearance  . Shortness of Breath   (Consider location/radiation/quality/duration/timing/severity/associated sxs/prior Treatment) Patient is a 79 y.o. male presenting with shortness of breath. The history is provided by the spouse and the patient.  Shortness of Breath  patient here with increased shortness of breath x24 hours. Does have a history of COPD and has been compliant with his medications. He has had a nonproductive cough with some associated chest discomfort. Describes chest discomfort as pressure and lasting for possibly 30 minutes with associated dyspnea but denies any associated diaphoresis or nausea and location of his chest discomfort as midsternal. He has had increasing chest pain type symptoms for the past week.  Patient also notes increased depression without suicidal thoughts. He also denies homicidal thoughts. Symptoms have been for about a month. He is not taking any medications for these. No prior history of depression. Denies any excessive alcohol or drug use  Past Medical History  Diagnosis Date  . History of MI (myocardial infarction) March 2007    Cath showed multivessel disease, referred for CABG  . CAD in native artery March 2007    Cath for dyspnea on exertion: 75% distal LM, 85% RI, 95% mid-distal Cx, in multiple RCA lesions with 95% distal. --> Referred for CABG  . S/P CABG x 24 July 2005    LIMA-LAD, SVG-OM, seq SVG-PDA -PLB  . Atrial fibrillation, chronic     Anticoagulated on warfarin. Stable -- post op  . H/O echocardiogram March 2007    Normal LV size and cavity appeared normal function. Grade 1 diastolic dysfunction. Limited study.  . Obesity (BMI 30-39.9)     One year ago weighed 265 pounds, (12/2012) -- now 234 pounds  . Dyslipidemia, goal LDL below 70   . COPD (chronic  obstructive pulmonary disease) with emphysema 05/23/2007    Hosp 5/29-6/01/12- COPD exacerbation ONOX 12/08/10- desaturated to less than 88% for over an hour, qualifying for home O2 during sleep    . Hypoxia      chronic, on home O2  . History of pneumonia   . Seasonal allergies    Past Surgical History  Procedure Laterality Date  . Cataract extraction      Laser  . Rll resection for hamartoma    . Rul for hamartoma    . Coronary artery bypass graft    . Cardiac catheterization  03 28 2007    NORMAL LV FUNCTION/ ABDOMINAL AORTA STENOSIS,75%-85%. RIGHT FEMORAL ARTERY :CATHETERS USED A  4-FRENCH WITH A 4-FRENCH SHEATH   Family History  Problem Relation Age of Onset  . Heart attack Mother   . Heart attack Father    History  Substance Use Topics  . Smoking status: Former Smoker -- 2.00 packs/day for 55 years    Types: Cigarettes    Quit date: 05/23/1998  . Smokeless tobacco: Never Used  . Alcohol Use: No    Review of Systems  Respiratory: Positive for shortness of breath.   All other systems reviewed and are negative.    Allergies  Review of patient's allergies indicates no known allergies.  Home Medications   Current Outpatient Rx  Name  Route  Sig  Dispense  Refill  . albuterol (PROVENTIL HFA;VENTOLIN HFA) 108 (90 BASE) MCG/ACT inhaler   Inhalation   Inhale 2 puffs  into the lungs every 6 (six) hours as needed for wheezing or shortness of breath.   1 Inhaler   prn   . amLODipine (NORVASC) 10 MG tablet   Oral   Take 1 tablet (10 mg total) by mouth daily.   30 tablet   1   . aspirin 81 MG tablet   Oral   Take 81 mg by mouth daily.           Marland Kitchen azithromycin (ZITHROMAX) 250 MG tablet   Oral   Take 1 tablet (250 mg total) by mouth daily.   5 each   0   . betamethasone dipropionate (DIPROLENE) 0.05 % cream   Topical   Apply 1 application topically daily.         . budesonide-formoterol (SYMBICORT) 160-4.5 MCG/ACT inhaler   Inhalation   Inhale 2 puffs  into the lungs 2 (two) times daily.   3 Inhaler   3     Symbicort 160 (lot) [id number], exp 02, 2014   . cholecalciferol (VITAMIN D) 1000 UNITS tablet   Oral   Take 1,000 Units by mouth daily.         . Fluticasone Furoate-Vilanterol (BREO ELLIPTA) 100-25 MCG/INH AEPB   Inhalation   Inhale 1 puff into the lungs daily.   2 each   0   . furosemide (LASIX) 40 MG tablet   Oral   Take 40 mg by mouth daily.         . hydrOXYzine (ATARAX/VISTARIL) 25 MG tablet   Oral   Take 25 mg by mouth 2 (two) times daily.         Marland Kitchen ipratropium-albuterol (DUONEB) 0.5-2.5 (3) MG/3ML SOLN   Nebulization   Take 3 mLs by nebulization every 6 (six) hours as needed (wheezing and shortness of breath).          . permethrin (ELIMITE) 5 % cream   Topical   Apply 1 application topically 2 (two) times daily.         . potassium chloride SA (K-DUR,KLOR-CON) 20 MEQ tablet   Oral   Take 20 mEq by mouth daily.         . predniSONE (DELTASONE) 50 MG tablet      Take 50 mg tablet today, and taper down by 10 mg daily until completed   15 tablet   0   . simvastatin (ZOCOR) 40 MG tablet   Oral   Take 40 mg by mouth every evening.         . warfarin (COUMADIN) 2.5 MG tablet   Oral   Take 1 tablet (2.5 mg total) by mouth daily.   90 tablet   3   . zolpidem (AMBIEN) 10 MG tablet   Oral   Take 5 mg by mouth at bedtime.           BP 103/76  Pulse 98  Temp(Src) 97.9 F (36.6 C) (Oral)  Resp 20  SpO2 100% Physical Exam  Nursing note and vitals reviewed. Constitutional: He is oriented to person, place, and time. He appears well-developed and well-nourished.  Non-toxic appearance. No distress.  HENT:  Head: Normocephalic and atraumatic.  Eyes: Conjunctivae, EOM and lids are normal. Pupils are equal, round, and reactive to light.  Neck: Normal range of motion. Neck supple. No tracheal deviation present. No mass present.  Cardiovascular: Normal rate, regular rhythm and normal heart  sounds.  Exam reveals no gallop.   No murmur heard. Pulmonary/Chest: Effort normal. No stridor.  No respiratory distress. He has decreased breath sounds. He has no wheezes. He has no rhonchi. He has no rales.  Abdominal: Soft. Normal appearance and bowel sounds are normal. He exhibits no distension. There is no tenderness. There is no rebound and no CVA tenderness.  Musculoskeletal: Normal range of motion. He exhibits no edema and no tenderness.  Neurological: He is alert and oriented to person, place, and time. He has normal strength. No cranial nerve deficit or sensory deficit. GCS eye subscore is 4. GCS verbal subscore is 5. GCS motor subscore is 6.  Skin: Skin is warm and dry. No abrasion and no rash noted.  Psychiatric: His speech is normal. His affect is blunt. He is slowed, withdrawn and actively hallucinating. Thought content is not paranoid. He expresses no suicidal plans and no homicidal plans.  Patient states that he hears music but denies having any command hallucinations    ED Course  Procedures (including critical care time) Labs Review Labs Reviewed  CBC WITH DIFFERENTIAL  COMPREHENSIVE METABOLIC PANEL  ETHANOL  URINE RAPID DRUG SCREEN (HOSP PERFORMED)   Imaging Review No results found.  EKG Interpretation    Date/Time:  Tuesday May 07 2013 11:10:07 EST Ventricular Rate:  108 PR Interval:    QRS Duration: 144 QT Interval:  388 QTC Calculation: 520 R Axis:   100 Text Interpretation:  Atrial fibrillation RBBB and LPFB Nonspecific ST abnormality Confirmed by STEINL  MD, KEVIN ([id number]) on 05/07/2013 11:18:26 AM            MDM  No diagnosis found.  Patient given albuterol along with Solu-Medrol. He does have increased anxiety but has no suicidal ideations. He also complains of having intermittent chest pain last for 30 minutes associated with dyspnea. Do not think that the patient has a PE as his d-dimer is negative. Patient will be admitted to the hospitalist  for further evaluation   Toy Baker, MD 05/07/13 1520

## 2013-05-07 NOTE — ED Notes (Signed)
Patient frequently voicing that he "can't take it anymore" and "I don't want to burden my wife."

## 2013-05-07 NOTE — ED Notes (Signed)
Bed: WA13 Expected date:  Expected time:  Means of arrival:  Comments: EMS-SOB 

## 2013-05-07 NOTE — ED Notes (Signed)
Patient c/o SOB and has a history of COPD. Patient states he has too much stress, the bills keep coming and the more he tries the worse things are piling in on him. Patient states he "constantly hears music like on an intercom and it is driving him crazy." Patient denies SI/HI, but stated he wanted God to take him on because he could not deal with it any longer. Patient's wife has dementia and is currently in the room tearful at times. Patient frequently consoling his wife.

## 2013-05-07 NOTE — Progress Notes (Signed)
ANTICOAGULATION CONSULT NOTE - Initial Consult  Pharmacy Consult for Warfarin Indication: atrial fibrillation  No Known Allergies  Patient Measurements: Height: 6' (182.9 cm) Weight: 218 lb 0.6 oz (98.9 kg) IBW/kg (Calculated) : 77.6  Vital Signs: Temp: 98.4 F (36.9 C) (12/16 1714) Temp src: Oral (12/16 1714) BP: 132/54 mmHg (12/16 1714) Pulse Rate: 100 (12/16 1714)  Labs:  Recent Labs  05/07/13 1200 05/07/13 1949  HGB 17.6*  --   HCT 52.5*  --   PLT 141*  --   LABPROT  --  28.4*  INR  --  2.78*  CREATININE 1.06  --   TROPONINI <0.30  --     Estimated Creatinine Clearance: 71.1 ml/min (by C-G formula based on Cr of 1.06).   Medical History: Past Medical History  Diagnosis Date  . History of MI (myocardial infarction) March 2007    Cath showed multivessel disease, referred for CABG  . CAD in native artery March 2007    Cath for dyspnea on exertion: 75% distal LM, 85% RI, 95% mid-distal Cx, in multiple RCA lesions with 95% distal. --> Referred for CABG  . S/P CABG x 24 July 2005    LIMA-LAD, SVG-OM, seq SVG-PDA -PLB  . Atrial fibrillation, chronic     Anticoagulated on warfarin. Stable -- post op  . H/O echocardiogram March 2007    Normal LV size and cavity appeared normal function. Grade 1 diastolic dysfunction. Limited study.  . Obesity (BMI 30-39.9)     One year ago weighed 265 pounds, (12/2012) -- now 234 pounds  . Dyslipidemia, goal LDL below 70   . COPD (chronic obstructive pulmonary disease) with emphysema 05/23/2007    Hosp 5/29-6/01/12- COPD exacerbation ONOX 12/08/10- desaturated to less than 88% for over an hour, qualifying for home O2 during sleep    . Hypoxia      chronic, on home O2  . History of pneumonia   . Seasonal allergies     Medications:  Scheduled:  . albuterol  2.5 mg Nebulization Q6H  . [START ON 05/08/2013] amLODipine  10 mg Oral Daily  . [START ON 05/08/2013] aspirin EC  81 mg Oral Daily  . atorvastatin  20 mg Oral q1800  .  budesonide-formoterol  2 puff Inhalation BID  . [START ON 05/08/2013] cholecalciferol  1,000 Units Oral Daily  . [START ON 05/08/2013] furosemide  40 mg Oral Daily  . levofloxacin (LEVAQUIN) IV  750 mg Intravenous Q24H  . methylPREDNISolone (SOLU-MEDROL) injection  40 mg Intravenous Q12H  . [START ON 05/08/2013] potassium chloride SA  20 mEq Oral Daily  . sodium chloride  3 mL Intravenous Q12H  . sodium chloride  3 mL Intravenous Q12H  . tiotropium  18 mcg Inhalation Daily  . [START ON 05/08/2013] Warfarin - Pharmacist Dosing Inpatient   Does not apply q1800  . zolpidem  5 mg Oral QHS   Infusions:    Assessment:  79 yr male admitted with COPD with acute exacerbation  On Warfarin PTA for h/o AFib  Home warfarin regimen = 2.5mg  daily except 5mg  on Mon & Thur   INR upon admission = 2.78.  Patient reports last dose taken today (12/16)  Goal of Therapy:  INR 2-3   Plan:  No additional warfarin needed tonight Check daily PT/INR  Lavanna Rog, Joselyn Glassman, PharmD 05/07/2013,8:46 PM

## 2013-05-07 NOTE — H&P (Signed)
[facility] History and Physical  Roberto Reilly JXB:[id number] DOB: [birth year] DOA: 05/07/2013  Referring physician: Dr. Freida Busman PCP: Juline Patch, MD  Specialist: I consulted Psychiatry  Chief Complaint: Nervousness, acute shortness of breath, transient chest discomfort  HPI: Roberto Reilly is a 79 y.o. male  With history of reactive depression, COPD on home oxygen, dyslipidemia, coronary artery disease status post coronary artery bypass grafting. Patient presents with the above complaints which has started since this morning. States that he is under a lot of stress lately because of financial problems as well as relationship problems (not with his wife). As such he reports he developed some transient chest discomfort which was described as tightness but denies any radiation of the chest discomfort, diaphoresis, or nausea associated with it. The shortness of breath has been persistent since onset this a.m. Nothing he is aware of makes it better or worse except for treatment administered in the ED.  In the emergency department patient was found to have a BNP of 760, a negative troponin, a negative d-dimer. Patient was recently discharged on 05/01/2013 and at that time was also treated for acute COPD exacerbation.   Review of Systems:  Constitutional:  No weight loss, night sweats, Fevers, chills, fatigue.  HEENT:  No headaches, Difficulty swallowing,Tooth/dental problems,Sore throat,  No sneezing, itching, ear ache, nasal congestion, post nasal drip,  Cardio-vascular:  + chest pain (resolved currently), Orthopnea, PND, swelling in lower extremities, anasarca, dizziness, palpitations  GI:  No heartburn, indigestion, abdominal pain, nausea, vomiting, diarrhea, change in bowel habits, loss of appetite  Resp:  + shortness of breath with exertion or at rest. No excess mucus, + productive cough,   No coughing up of blood. No change in color of mucus. + wheezing. No chest wall deformity  Skin:   no rash or lesions.  GU:  no dysuria, change in color of urine, no urgency or frequency. No flank pain.  Musculoskeletal:  No joint pain or swelling. No decreased range of motion. No back pain.  Psych:  + depression or anxiety. No memory loss.   Past Medical History  Diagnosis Date  . History of MI (myocardial infarction) March 2007    Cath showed multivessel disease, referred for CABG  . CAD in native artery March 2007    Cath for dyspnea on exertion: 75% distal LM, 85% RI, 95% mid-distal Cx, in multiple RCA lesions with 95% distal. --> Referred for CABG  . S/P CABG x 24 July 2005    LIMA-LAD, SVG-OM, seq SVG-PDA -PLB  . Atrial fibrillation, chronic     Anticoagulated on warfarin. Stable -- post op  . H/O echocardiogram March 2007    Normal LV size and cavity appeared normal function. Grade 1 diastolic dysfunction. Limited study.  . Obesity (BMI 30-39.9)     One year ago weighed 265 pounds, (12/2012) -- now 234 pounds  . Dyslipidemia, goal LDL below 70   . COPD (chronic obstructive pulmonary disease) with emphysema 05/23/2007    Hosp 5/29-6/01/12- COPD exacerbation ONOX 12/08/10- desaturated to less than 88% for over an hour, qualifying for home O2 during sleep    . Hypoxia      chronic, on home O2  . History of pneumonia   . Seasonal allergies    Past Surgical History  Procedure Laterality Date  . Cataract extraction      Laser  . Rll resection for hamartoma    . Rul for hamartoma    . Coronary artery bypass graft    .  Cardiac catheterization  03 28 2007    NORMAL LV FUNCTION/ ABDOMINAL AORTA STENOSIS,75%-85%. RIGHT FEMORAL ARTERY :CATHETERS USED A  4-FRENCH WITH A 4-FRENCH SHEATH   Social History:  reports that he quit smoking about 14 years ago. His smoking use included Cigarettes. He has a 110 pack-year smoking history. He has never used smokeless tobacco. He reports that he does not drink alcohol or use illicit drugs.  No Known Allergies  Family History  Problem  Relation Age of Onset  . Heart attack Mother   . Heart attack Father      Prior to Admission medications   Medication Sig Start Date End Date Taking? Authorizing Provider  amLODipine (NORVASC) 10 MG tablet Take 1 tablet (10 mg total) by mouth daily. 05/01/13  Yes Dorothea Ogle, MD  aspirin 81 MG tablet Take 81 mg by mouth daily.     Yes Historical Provider, MD  budesonide-formoterol (SYMBICORT) 160-4.5 MCG/ACT inhaler Inhale 2 puffs into the lungs 2 (two) times daily. 10/14/11  Yes Waymon Budge, MD  cholecalciferol (VITAMIN D) 1000 UNITS tablet Take 1,000 Units by mouth daily.   Yes Historical Provider, MD  furosemide (LASIX) 40 MG tablet Take 40 mg by mouth daily.   Yes Historical Provider, MD  ipratropium-albuterol (DUONEB) 0.5-2.5 (3) MG/3ML SOLN Take 3 mLs by nebulization every 6 (six) hours as needed (wheezing and shortness of breath).    Yes Historical Provider, MD  potassium chloride SA (K-DUR,KLOR-CON) 20 MEQ tablet Take 20 mEq by mouth daily.   Yes Historical Provider, MD  simvastatin (ZOCOR) 40 MG tablet Take 40 mg by mouth every evening.   Yes Historical Provider, MD  warfarin (COUMADIN) 2.5 MG tablet Take 2.5-5 mg by mouth daily. Takes 1 tab (2.5mg ) daily except 2 tabs (5mg ) Mon/Thurs 02/19/13  Yes Phillips Hay, RPH-CPP  zolpidem (AMBIEN) 10 MG tablet Take 5 mg by mouth at bedtime.    Yes Historical Provider, MD  predniSONE (DELTASONE) 50 MG tablet Take 50 mg tablet today, and taper down by 10 mg daily until completed 05/01/13   Dorothea Ogle, MD   Physical Exam: Filed Vitals:   05/07/13 1714  BP: 132/54  Pulse: 100  Temp: 98.4 F (36.9 C)  Resp: 20    BP 132/54  Pulse 100  Temp(Src) 98.4 F (36.9 C) (Oral)  Resp 20  Ht 6' (1.829 m)  Wt 98.9 kg (218 lb 0.6 oz)  BMI 29.56 kg/m2  SpO2 100%  General: Alert, awake, oriented x3, in no acute distress. Head: Atraumatic, normocephalic Eyes: Extraocular movements intact, nonicteric Neck: Supple, no goiter Nose: No  rhinorrhea, normal exterior appearance Heart: Irregularly irregular, without murmurs, rubs, gallops. Lungs: Increased work of breathing, nasal cannula in place, wheezes bilaterally, speaking in full sentences Abdomen: Soft, nontender, nondistended, positive bowel sounds, obese. Extremities: No clubbing cyanosis or edema with positive pedal pulses. Neuro: Grossly intact, nonfocal. Psych: Patient with tangential thinking           Labs on Admission:  Basic Metabolic Panel:  Recent Labs Lab 05/01/13 0605 05/07/13 1200  NA 135 135  K 4.9 3.8  CL 99 95*  CO2 28 26  GLUCOSE 106* 114*  BUN 30* 26*  CREATININE 1.18 1.06  CALCIUM 8.7 9.4  MG 2.5  --    Liver Function Tests:  Recent Labs Lab 05/01/13 0605 05/07/13 1200  AST 13 20  ALT 17 26  ALKPHOS 40 49  BILITOT 0.6 1.3*  PROT 5.7* 6.8  ALBUMIN  3.1* 4.0   No results found for this basename: LIPASE, AMYLASE,  in the last 168 hours No results found for this basename: AMMONIA,  in the last 168 hours CBC:  Recent Labs Lab 05/01/13 0605 05/07/13 1200  WBC 14.3* 13.2*  NEUTROABS 10.9* 10.3*  HGB 16.3 17.6*  HCT 47.7 52.5*  MCV 87.5 89.1  PLT 138* 141*   Cardiac Enzymes:  Recent Labs Lab 05/07/13 1200  TROPONINI <0.30    BNP (last 3 results)  Recent Labs  04/29/13 1716 05/07/13 1200  PROBNP 445.2 760.7*   CBG: No results found for this basename: GLUCAP,  in the last 168 hours  Radiological Exams on Admission: Dg Chest 2 View  05/07/2013   CLINICAL DATA:  Shortness of breath and productive cough.  EXAM: CHEST  2 VIEW  COMPARISON:  04/29/2013  FINDINGS: There is chronic volume loss in the right hemithorax. No evidence for airspace disease or pulmonary edema. Post CABG changes in the chest. Heart size is grossly stable. There is a stable sclerotic density involving the left mid clavicle and this may be within the bone. Diffuse degenerative changes in the thoracic spine.  IMPRESSION: Chronic lung changes  without acute findings.   Electronically Signed   By: Richarda Overlie M.D.   On: 05/07/2013 12:58    EKG: Independently reviewed.  irregularly irregular no ST elevations or depression  Assessment/Plan Principal Problem:   COPD with acute exacerbation - Patient recently admitted for COPD exacerbation - Will cover with Levaquin, not sure this is an acute exacerbation versus prolonged improvement from  most recent exacerbation.  As such we'll not broaden antibiotic therapy for HCAP - Supplemental oxygen, steroids, bronchodilators - Chest x-ray showed chronic lung changes without acute findings  Chest discomfort - Seems atypical - Troponin negative - Given history of coronary artery disease status post CABG x4 will obtain troponins x3 - Patient's main complaint is psychiatric in nature and suspect this may be playing a role in this transient chest discomfort - Chest x-ray showed chronic lung changes without acute findings  Active Problems:   Reactive depression (situational) - Most likely cause of admission - Consult with psychiatry and they are to evaluate patient next a.m. - Patient was also complaining of auditory hallucinations    CAD in native artery - LM, RCA, RI & Cx disease --> CABG x 4 (LIMA-LAD, SVG-OM/RI, SVG-rPDA-rPL)   S/P CABG x 4 - Continue statin aspirin     Atrial fibrillation, chronic / persistent - On last admission given bradycardia patient was taken off of beta blocker and currently not on rate controlling agent - Pharmacy to manage Coumadin    Dyslipidemia, goal LDL below 70 - Stable continue statin   Code Status: Full code Family Communication: Discussed with patient and spouse Disposition Plan: Pending psychiatry recommendations and negative troponins may consider discharge within the next one to 2 days  Time spent: > 65 minutes  Penny Pia [facility] Pager [phone]

## 2013-05-07 NOTE — Progress Notes (Signed)
Patient spoke to nurse regarding hearing music. Currently is not hearing any at this time and has not since around 730pm today but expresses feeling of depression and nervousness due to financial issues and other outside issues. Concerned about current condition. Advised that attending doctor will discuss status and plan of care in AM. Will cont to monitor closely.

## 2013-05-07 NOTE — Progress Notes (Signed)
ANTIBIOTIC CONSULT NOTE - INITIAL  Pharmacy Consult for Levaquin Indication: R/O Acute COPD exacerbation  No Known Allergies  Patient Measurements: Height: 6' (182.9 cm) Weight: 218 lb 0.6 oz (98.9 kg) IBW/kg (Calculated) : 77.6   Vital Signs: Temp: 98.4 F (36.9 C) (12/16 1714) Temp src: Oral (12/16 1714) BP: 132/54 mmHg (12/16 1714) Pulse Rate: 100 (12/16 1714) Intake/Output from previous day:   Intake/Output from this shift: Total I/O In: 0  Out: 680 [Urine:680]  Labs:  Recent Labs  05/07/13 1200  WBC 13.2*  HGB 17.6*  PLT 141*  CREATININE 1.06   Estimated Creatinine Clearance: 71.1 ml/min (by C-G formula based on Cr of 1.06). No results found for this basename: VANCOTROUGH, Leodis Binet, VANCORANDOM, GENTTROUGH, GENTPEAK, GENTRANDOM, TOBRATROUGH, TOBRAPEAK, TOBRARND, AMIKACINPEAK, AMIKACINTROU, AMIKACIN,  in the last 72 hours   Microbiology: Recent Results (from the past 720 hour(s))  CULTURE, EXPECTORATED SPUTUM-ASSESSMENT     Status: None   Collection Time    05/01/13 11:49 AM      Result Value Range Status   Specimen Description SPUTUM   Final   Special Requests NONE   Final   Sputum evaluation     Final   Value: THIS SPECIMEN IS ACCEPTABLE. RESPIRATORY CULTURE REPORT TO FOLLOW.   Report Status 05/01/2013 FINAL   Final  CULTURE, RESPIRATORY (NON-EXPECTORATED)     Status: None   Collection Time    05/01/13 11:49 AM      Result Value Range Status   Specimen Description SPUTUM   Final   Special Requests NONE   Final   Gram Stain     Final   Value: MODERATE WBC PRESENT, PREDOMINANTLY PMN     RARE SQUAMOUS EPITHELIAL CELLS PRESENT     FEW GRAM POSITIVE COCCI     IN PAIRS IN CLUSTERS     Performed at [facility]   Culture     Final   Value: NORMAL OROPHARYNGEAL FLORA     Performed at [facility]   Report Status 05/03/2013 FINAL   Final    Medical History: Past Medical History  Diagnosis Date  . History of MI (myocardial infarction)  March 2007    Cath showed multivessel disease, referred for CABG  . CAD in native artery March 2007    Cath for dyspnea on exertion: 75% distal LM, 85% RI, 95% mid-distal Cx, in multiple RCA lesions with 95% distal. --> Referred for CABG  . S/P CABG x 24 July 2005    LIMA-LAD, SVG-OM, seq SVG-PDA -PLB  . Atrial fibrillation, chronic     Anticoagulated on warfarin. Stable -- post op  . H/O echocardiogram March 2007    Normal LV size and cavity appeared normal function. Grade 1 diastolic dysfunction. Limited study.  . Obesity (BMI 30-39.9)     One year ago weighed 265 pounds, (12/2012) -- now 234 pounds  . Dyslipidemia, goal LDL below 70   . COPD (chronic obstructive pulmonary disease) with emphysema 05/23/2007    Hosp 5/29-6/01/12- COPD exacerbation ONOX 12/08/10- desaturated to less than 88% for over an hour, qualifying for home O2 during sleep    . Hypoxia      chronic, on home O2  . History of pneumonia   . Seasonal allergies     Medications:  Scheduled:  . albuterol  2.5 mg Nebulization Q6H  . [START ON 05/08/2013] amLODipine  10 mg Oral Daily  . [START ON 05/08/2013] aspirin EC  81 mg Oral Daily  .  atorvastatin  20 mg Oral q1800  . budesonide-formoterol  2 puff Inhalation BID  . [START ON 05/08/2013] cholecalciferol  1,000 Units Oral Daily  . [START ON 05/08/2013] furosemide  40 mg Oral Daily  . methylPREDNISolone (SOLU-MEDROL) injection  40 mg Intravenous Q12H  . [START ON 05/08/2013] potassium chloride SA  20 mEq Oral Daily  . sodium chloride  3 mL Intravenous Q12H  . sodium chloride  3 mL Intravenous Q12H  . tiotropium  18 mcg Inhalation Daily  . zolpidem  5 mg Oral QHS   Infusions:   Assessment:  79 yr male with recent hospitalization for acute COPD exacerbation, presents with noted acute SOB, transient chest discomfort and nervousness  Levaquin per pharmacy dosing to be started empirically for r/o acute COPD exacerbation vs prolonged improvement from recent  exacerbation  Goal of Therapy:  Eradication of suspected infection  Plan:  Levaquin 750mg  IV q24h  Tassie Pollett, Joselyn Glassman, PharmD 05/07/2013,5:53 PM

## 2013-05-07 NOTE — Progress Notes (Signed)
   CARE MANAGEMENT ED NOTE 05/07/2013  Patient:  Roberto Reilly, Roberto Reilly   Account Number:  [id number]  Date Initiated:  05/07/2013  Documentation initiated by:  Radford Pax  Subjective/Objective Assessment:   Patient presenst to Ed with increased shortness of breath and suicidal ideations.     Subjective/Objective Assessment Detail:   Patient with history of COPD, currently wears oxygen at home.  Patient also reports hearing music in his head.     Action/Plan:   Action/Plan Detail:   Anticipated DC Date:       Status Recommendation to Physician:   Result of Recommendation:      DC Planning Services  CM consult  Other    Choice offered to / List presented to:            Status of service:  Completed, signed off  ED Comments:   ED Comments Detail:  [facility] consulted to see patient regarding home health at discharge.  EDCM spoke to patient and his wife at bedside. Patient is hard of hearing.  Patient appears short of breath with oxygen at rest.  Patient reports that he has seen a visiting RN from [place] but has not had any physical therapy because, "Of too many times in the hospital."  Patient reports he does not have a walker or can at home.  Patient does wear oxygen at home.  Patient lives with his wife.  This EDCM placed a text to Venia Minks transitional  specialist for [facility] to make her aware of patient's admission.  No further CM needs at this time.

## 2013-05-08 DIAGNOSIS — J441 Chronic obstructive pulmonary disease with (acute) exacerbation: Secondary | ICD-10-CM | POA: Diagnosis not present

## 2013-05-08 DIAGNOSIS — R079 Chest pain, unspecified: Secondary | ICD-10-CM | POA: Diagnosis not present

## 2013-05-08 DIAGNOSIS — J9620 Acute and chronic respiratory failure, unspecified whether with hypoxia or hypercapnia: Secondary | ICD-10-CM | POA: Diagnosis not present

## 2013-05-08 DIAGNOSIS — R443 Hallucinations, unspecified: Secondary | ICD-10-CM | POA: Diagnosis not present

## 2013-05-08 DIAGNOSIS — Z9981 Dependence on supplemental oxygen: Secondary | ICD-10-CM | POA: Diagnosis not present

## 2013-05-08 DIAGNOSIS — I4891 Unspecified atrial fibrillation: Secondary | ICD-10-CM | POA: Diagnosis not present

## 2013-05-08 DIAGNOSIS — F411 Generalized anxiety disorder: Secondary | ICD-10-CM | POA: Diagnosis not present

## 2013-05-08 LAB — BASIC METABOLIC PANEL
CO2: 26 mEq/L (ref 19–32)
Calcium: 8.6 mg/dL (ref 8.4–10.5)
Chloride: 98 mEq/L (ref 96–112)
Creatinine, Ser: 1.07 mg/dL (ref 0.50–1.35)
GFR calc Af Amer: 75 mL/min — ABNORMAL LOW (ref >90)
Sodium: 134 mEq/L — ABNORMAL LOW (ref 135–145)

## 2013-05-08 LAB — CBC
HCT: 48 % (ref 39.0–52.0)
Platelets: 127 10*3/uL — ABNORMAL LOW (ref 150–400)
RBC: 5.44 MIL/uL (ref 4.22–5.81)
RDW: 14.2 % (ref 11.5–15.5)
WBC: 11.2 10*3/uL — ABNORMAL HIGH (ref 4.0–10.5)

## 2013-05-08 LAB — TROPONIN I: Troponin I: <0.3 ng/mL (ref <0.30)

## 2013-05-08 MED ORDER — WARFARIN SODIUM 1 MG PO TABS
1.0000 mg | ORAL_TABLET | Freq: Once | ORAL | Status: AC
  Administered 2013-05-08: 1 mg via ORAL
  Filled 2013-05-08: qty 1

## 2013-05-08 NOTE — Consult Note (Signed)
[facility] Face-to-Face Psychiatry Consult   Reason for Consult:  Roberto Referring Physician:  Dr Doristine Devoid Burbach is an 79 y.o. Reilly.  Assessment: AXIS I:  Depressive Disorder NOS AXIS II:  Deferred AXIS III:   Past Medical History  Diagnosis Date  . History of MI (myocardial infarction) March 2007    Cath showed multivessel disease, referred for CABG  . CAD in native artery March 2007    Cath for dyspnea on exertion: 75% distal LM, 85% RI, 95% mid-distal Cx, in multiple RCA lesions with 95% distal. --> Referred for CABG  . S/P CABG x 24 July 2005    LIMA-LAD, SVG-OM, seq SVG-PDA -PLB  . Atrial fibrillation, chronic     Anticoagulated on warfarin. Stable -- post op  . H/O echocardiogram March 2007    Normal LV size and cavity appeared normal function. Grade 1 diastolic dysfunction. Limited study.  . Obesity (BMI 30-39.9)     One year ago weighed 265 pounds, (12/2012) -- now 234 pounds  . Dyslipidemia, goal LDL below 70   . COPD (chronic obstructive pulmonary disease) with emphysema 05/23/2007    Hosp 5/29-6/01/12- COPD exacerbation ONOX 12/08/10- desaturated to less than 88% for over an hour, qualifying for home O2 during sleep    . Hypoxia      chronic, on home O2  . History of pneumonia   . Seasonal allergies    AXIS IV:  economic problems, other psychosocial or environmental problems, problems related to social environment and problems with primary support group AXIS V:  61-70 mild symptoms  Plan:  No evidence of imminent risk to self or others at present.   Supportive therapy provided about ongoing stressors. Discussed crisis plan, support from social network, calling 911, coming to the Emergency Department, and calling Suicide Hotline.  Subjective:   Roberto Reilly is a 79 y.o. Reilly Roberto Reilly admitted with anxiety, SOB and chest discomfort.  HPI:  Roberto Reilly seen and chart reviewed. Roberto Reilly is 79 years old man who was admitted with c/o sob, chest discomfort and nervousness.  Consult was called because Roberto Reilly was very anxious and c/o hearing music for past few days. Roberto Reilly endorse financial stress and recently reciewed a $4000 medical bill. Roberto Reilly has limited income and Roberto Reilly has multiple health issues. Roberto Reilly admitted poor sleep and racing thoughts and feel very anxious. Roberto Reilly also reported hearing music for past few days which Roberto Reilly describe old songs. Roberto Reilly denies any visual Roberto, paranoia, suicidal ideation, mania, agitation or aggresion.  Roberto Reilly mention that Roberto Reilly has not taken his Ambien for past few days. Today Roberto Reilly is feeling better and not have any visual or auditory Roberto. Roberto Reilly feel much better and slept well. Roberto Reilly lives with his wife. Roberto Reilly is retired and limited income. Roberto Reilly denies any past hx of psychiatric treatment.  Roberto Reilly is not interested in any treatment. Roberto Reilly is cooperative and feeling better.  HPI Elements:   Location:  medical floor. Quality:  fair. Severity:  mild.  Past Psychiatric History: Past Medical History  Diagnosis Date  . History of MI (myocardial infarction) March 2007    Cath showed multivessel disease, referred for CABG  . CAD in native artery March 2007    Cath for dyspnea on exertion: 75% distal LM, 85% RI, 95% mid-distal Cx, in multiple RCA lesions with 95% distal. --> Referred for CABG  . S/P CABG x 24 July 2005    LIMA-LAD, SVG-OM, seq SVG-PDA -PLB  . Atrial fibrillation, chronic  Anticoagulated on warfarin. Stable -- post op  . H/O echocardiogram March 2007    Normal LV size and cavity appeared normal function. Grade 1 diastolic dysfunction. Limited study.  . Obesity (BMI 30-39.9)     One year ago weighed 265 pounds, (12/2012) -- now 234 pounds  . Dyslipidemia, goal LDL below 70   . COPD (chronic obstructive pulmonary disease) with emphysema 05/23/2007    Hosp 5/29-6/01/12- COPD exacerbation ONOX 12/08/10- desaturated to less than 88% for over an hour, qualifying for home O2 during sleep    . Hypoxia      chronic, on home O2  . History of  pneumonia   . Seasonal allergies     reports that Roberto Reilly quit smoking about 14 years ago. His smoking use included Cigarettes. Roberto Reilly has a 110 pack-year smoking history. Roberto Reilly has never used smokeless tobacco. Roberto Reilly reports that Roberto Reilly does not drink alcohol or use illicit drugs. Family History  Problem Relation Age of Onset  . Heart attack Mother   . Heart attack Father      Living Arrangements: Spouse/significant other   Abuse/Neglect [facility]) Physical Abuse: Denies Verbal Abuse: Denies Sexual Abuse: Denies Allergies:  No Known Allergies  ACT Assessment Complete:  No:   Past Psychiatric History: No past history Place of Residence:  home Marital Status:  married Employed/Unemployed:  retired Family Supports:  limited Objective: Blood pressure 115/71, pulse 77, temperature 97.6 F (36.4 C), temperature source Oral, resp. rate 24, height 6' (1.829 m), weight 218 lb 0.6 oz (98.9 kg), SpO2 98.00%.Body mass index is 29.56 kg/(m^2). Results for orders placed during the hospital encounter of 05/07/13 (from the past 72 hour(s))  CBC WITH DIFFERENTIAL     Status: Abnormal   Collection Time    05/07/13 12:00 PM      Result Value Range   WBC 13.2 (*) 4.0 - 10.5 K/uL   RBC 5.89 (*) 4.22 - 5.81 MIL/uL   Hemoglobin 17.6 (*) 13.0 - 17.0 g/dL   HCT 65.7 (*) 84.6 - 96.2 %   MCV 89.1  78.0 - 100.0 fL   MCH 29.9  26.0 - 34.0 pg   MCHC 33.5  30.0 - 36.0 g/dL   RDW 95.2  84.1 - 32.4 %   Platelets 141 (*) 150 - 400 K/uL   Neutrophils Relative % 78 (*) 43 - 77 %   Neutro Abs 10.3 (*) 1.7 - 7.7 K/uL   Lymphocytes Relative 11 (*) 12 - 46 %   Lymphs Abs 1.5  0.7 - 4.0 K/uL   Monocytes Relative 11  3 - 12 %   Monocytes Absolute 1.4 (*) 0.1 - 1.0 K/uL   Eosinophils Relative 1  0 - 5 %   Eosinophils Absolute 0.1  0.0 - 0.7 K/uL   Basophils Relative 0  0 - 1 %   Basophils Absolute 0.0  0.0 - 0.1 K/uL  COMPREHENSIVE METABOLIC PANEL     Status: Abnormal   Collection Time    05/07/13 12:00 PM      Result Value  Range   Sodium 135  135 - 145 mEq/L   Potassium 3.8  3.5 - 5.1 mEq/L   Chloride 95 (*) 96 - 112 mEq/L   CO2 26  19 - 32 mEq/L   Glucose, Bld 114 (*) 70 - 99 mg/dL   BUN 26 (*) 6 - 23 mg/dL   Creatinine, Ser 4.01  0.50 - 1.35 mg/dL   Calcium 9.4  8.4 - 02.7 mg/dL  Total Protein 6.8  6.0 - 8.3 g/dL   Albumin 4.0  3.5 - 5.2 g/dL   AST 20  0 - 37 U/L   ALT 26  0 - 53 U/L   Alkaline Phosphatase 49  39 - 117 U/L   Total Bilirubin 1.3 (*) 0.3 - 1.2 mg/dL   GFR calc non Af Amer 66 (*) >90 mL/min   GFR calc Af Amer 76 (*) >90 mL/min   Comment: (NOTE)     The eGFR has been calculated using the CKD EPI equation.     This calculation has not been validated in all clinical situations.     eGFR's persistently <90 mL/min signify possible Chronic Kidney     Disease.  ETHANOL     Status: None   Collection Time    05/07/13 12:00 PM      Result Value Range   Alcohol, Ethyl (B) <11  0 - 11 mg/dL   Comment:            LOWEST DETECTABLE LIMIT FOR     SERUM ALCOHOL IS 11 mg/dL     FOR MEDICAL PURPOSES ONLY  TROPONIN I     Status: None   Collection Time    05/07/13 12:00 PM      Result Value Range   Troponin I <0.30  <0.30 ng/mL   Comment:            Due to the release kinetics of cTnI,     a negative result within the first hours     of the onset of symptoms does not rule out     myocardial infarction with certainty.     If myocardial infarction is still suspected,     repeat the test at appropriate intervals.  PRO B NATRIURETIC PEPTIDE     Status: Abnormal   Collection Time    05/07/13 12:00 PM      Result Value Range   Pro B Natriuretic peptide (BNP) 760.7 (*) 0 - 450 pg/mL  URINE RAPID DRUG SCREEN (HOSP PERFORMED)     Status: None   Collection Time    05/07/13 12:11 PM      Result Value Range   Opiates NONE DETECTED  NONE DETECTED   Cocaine NONE DETECTED  NONE DETECTED   Benzodiazepines NONE DETECTED  NONE DETECTED   Amphetamines NONE DETECTED  NONE DETECTED    Tetrahydrocannabinol NONE DETECTED  NONE DETECTED   Barbiturates NONE DETECTED  NONE DETECTED   Comment:            DRUG SCREEN FOR MEDICAL PURPOSES     ONLY.  IF CONFIRMATION IS NEEDED     FOR ANY PURPOSE, NOTIFY LAB     WITHIN 5 DAYS.                LOWEST DETECTABLE LIMITS     FOR URINE DRUG SCREEN     Drug Class       Cutoff (ng/mL)     Amphetamine      1000     Barbiturate      200     Benzodiazepine   200     Tricyclics       300     Opiates          300     Cocaine          300     THC  50  D-DIMER, QUANTITATIVE     Status: None   Collection Time    05/07/13  1:15 PM      Result Value Range   D-Dimer, Quant 0.27  0.00 - 0.48 ug/mL-FEU   Comment:            AT THE INHOUSE ESTABLISHED CUTOFF     VALUE OF 0.48 ug/mL FEU,     THIS ASSAY HAS BEEN DOCUMENTED     IN THE LITERATURE TO HAVE     A SENSITIVITY AND NEGATIVE     PREDICTIVE VALUE OF AT LEAST     98 TO 99%.  THE TEST RESULT     SHOULD BE CORRELATED WITH     AN ASSESSMENT OF THE CLINICAL     PROBABILITY OF DVT / VTE.  MAGNESIUM     Status: None   Collection Time    05/07/13  7:49 PM      Result Value Range   Magnesium 2.2  1.5 - 2.5 mg/dL  PHOSPHORUS     Status: None   Collection Time    05/07/13  7:49 PM      Result Value Range   Phosphorus 3.0  2.3 - 4.6 mg/dL  TROPONIN I     Status: None   Collection Time    05/07/13  7:49 PM      Result Value Range   Troponin I <0.30  <0.30 ng/mL   Comment:            Due to the release kinetics of cTnI,     a negative result within the first hours     of the onset of symptoms does not rule out     myocardial infarction with certainty.     If myocardial infarction is still suspected,     repeat the test at appropriate intervals.  PROTIME-INR     Status: Abnormal   Collection Time    05/07/13  7:49 PM      Result Value Range   Prothrombin Time 28.4 (*) 11.6 - 15.2 seconds   INR 2.78 (*) 0.00 - 1.49  BASIC METABOLIC PANEL     Status: Abnormal    Collection Time    05/08/13  1:40 AM      Result Value Range   Sodium 134 (*) 135 - 145 mEq/L   Potassium 4.4  3.5 - 5.1 mEq/L   Chloride 98  96 - 112 mEq/L   CO2 26  19 - 32 mEq/L   Glucose, Bld 129 (*) 70 - 99 mg/dL   BUN 29 (*) 6 - 23 mg/dL   Creatinine, Ser 4.09  0.50 - 1.35 mg/dL   Calcium 8.6  8.4 - 81.1 mg/dL   GFR calc non Af Amer 65 (*) >90 mL/min   GFR calc Af Amer 75 (*) >90 mL/min   Comment: (NOTE)     The eGFR has been calculated using the CKD EPI equation.     This calculation has not been validated in all clinical situations.     eGFR's persistently <90 mL/min signify possible Chronic Kidney     Disease.  CBC     Status: Abnormal   Collection Time    05/08/13  1:40 AM      Result Value Range   WBC 11.2 (*) 4.0 - 10.5 K/uL   RBC 5.44  4.22 - 5.81 MIL/uL   Hemoglobin 16.2  13.0 - 17.0 g/dL   HCT 91.4  78.2 -  52.0 %   MCV 88.2  78.0 - 100.0 fL   MCH 29.8  26.0 - 34.0 pg   MCHC 33.8  30.0 - 36.0 g/dL   RDW 13.0  86.5 - 78.4 %   Platelets 127 (*) 150 - 400 K/uL  TROPONIN I     Status: None   Collection Time    05/08/13  1:40 AM      Result Value Range   Troponin I <0.30  <0.30 ng/mL   Comment:            Due to the release kinetics of cTnI,     a negative result within the first hours     of the onset of symptoms does not rule out     myocardial infarction with certainty.     If myocardial infarction is still suspected,     repeat the test at appropriate intervals.  PROTIME-INR     Status: Abnormal   Collection Time    05/08/13  1:40 AM      Result Value Range   Prothrombin Time 31.3 (*) 11.6 - 15.2 seconds   INR 3.16 (*) 0.00 - 1.49   Labs are reviewed.  Current Facility-Administered Medications  Medication Dose Route Frequency Provider Last Rate Last Dose  . 0.9 %  sodium chloride infusion  250 mL Intravenous PRN Penny Pia, MD      . albuterol (PROVENTIL) (5 MG/ML) 0.5% nebulizer solution 2.5 mg  2.5 mg Nebulization Q6H Penny Pia, MD   2.5 mg  at 05/08/13 1941  . albuterol (PROVENTIL) (5 MG/ML) 0.5% nebulizer solution 2.5 mg  2.5 mg Nebulization Q2H PRN Penny Pia, MD      . amLODipine (NORVASC) tablet 10 mg  10 mg Oral Daily Penny Pia, MD   10 mg at 05/08/13 1100  . aspirin EC tablet 81 mg  81 mg Oral Daily Penny Pia, MD   81 mg at 05/08/13 1100  . atorvastatin (LIPITOR) tablet 20 mg  20 mg Oral q1800 Penny Pia, MD   20 mg at 05/08/13 1738  . budesonide-formoterol (SYMBICORT) 160-4.5 MCG/ACT inhaler 2 puff  2 puff Inhalation BID Penny Pia, MD   2 puff at 05/08/13 1941  . cholecalciferol (VITAMIN D) tablet 1,000 Units  1,000 Units Oral Daily Penny Pia, MD   1,000 Units at 05/08/13 1100  . furosemide (LASIX) tablet 40 mg  40 mg Oral Daily Penny Pia, MD   40 mg at 05/08/13 1100  . levofloxacin (LEVAQUIN) IVPB 750 mg  750 mg Intravenous Q24H Leann Trefz Poindexter, RPH   750 mg at 05/08/13 1736  . methylPREDNISolone sodium succinate (SOLU-MEDROL) 40 mg/mL injection 40 mg  40 mg Intravenous Q12H Penny Pia, MD   40 mg at 05/08/13 1736  . morphine 2 MG/ML injection 1 mg  1 mg Intravenous Q3H PRN Penny Pia, MD      . nitroGLYCERIN (NITROSTAT) SL tablet 0.4 mg  0.4 mg Sublingual Q5 min PRN Penny Pia, MD      . ondansetron (ZOFRAN) tablet 4 mg  4 mg Oral Q6H PRN Penny Pia, MD       Or  . ondansetron (ZOFRAN) injection 4 mg  4 mg Intravenous Q6H PRN Penny Pia, MD      . potassium chloride SA (K-DUR,KLOR-CON) CR tablet 20 mEq  20 mEq Oral Daily Penny Pia, MD   20 mEq at 05/08/13 1100  . sodium chloride 0.9 % injection 3 mL  3 mL Intravenous Q12H  Penny Pia, MD   3 mL at 05/08/13 1100  . sodium chloride 0.9 % injection 3 mL  3 mL Intravenous Q12H Penny Pia, MD   3 mL at 05/08/13 1100  . sodium chloride 0.9 % injection 3 mL  3 mL Intravenous PRN Penny Pia, MD      . tiotropium [facility]) inhalation capsule 18 mcg  18 mcg Inhalation Daily Penny Pia, MD   18 mcg at 05/08/13 [phone]  . Warfarin - Pharmacist  Dosing Inpatient   Does not apply q1800 Leann Trefz Poindexter, RPH      . zolpidem (AMBIEN) tablet 5 mg  5 mg Oral QHS Penny Pia, MD   5 mg at 05/07/13 2313    Psychiatric Specialty Exam:     Blood pressure 115/71, pulse 77, temperature 97.6 F (36.4 C), temperature source Oral, resp. rate 24, height 6' (1.829 m), weight 218 lb 0.6 oz (98.9 kg), SpO2 98.00%.Body mass index is 29.56 kg/(m^2).  General Appearance: Casual and Fairly Groomed  Patent attorney::  Fair  Speech:  Normal Rate  Volume:  Decreased  Mood:  Angry  Affect:  Congruent  Thought Process:  Goal Directed  Orientation:  Full (Time, Place, and Person)  Thought Content:  Rumination  Suicidal Thoughts:  No  Homicidal Thoughts:  No  Memory:  Immediate;   Fair Recent;   Fair Remote;   Fair  Judgement:  Intact  Insight:  Fair  Psychomotor Activity:  Normal  Concentration:  Fair  Recall:  Fair  Akathisia:  No  Handed:  Right  AIMS (if indicated):     Assets:  Communication Skills Desire for Improvement Housing  Sleep:      Treatment Plan Summary: Medication management Consider trial of SSRI to help anxiety, start Zoloft 25 mg daily if Roberto Reilly agree to take medication and if not medically not contraindicated. At this time Roberto Reilly does not have any Roberto and does not require any treatment with anti-psychotic medication.  Roberto Reilly can be benefits out Roberto Reilly counseling if Roberto Reilly agreed. Please call [phone] if you further question.  Ely Ballen T. 05/08/2013 9:09 PM

## 2013-05-08 NOTE — Progress Notes (Signed)
[facility] PROGRESS NOTE  Roberto Reilly QMV:[id number] DOB: 14-Jul-1933 DOA: 05/07/2013 PCP: Juline Patch, MD  Assessment/Plan  COPD with acute exacerbation, persistently Roberto Reilly of breath at rest - Continue Levaquin, - Continue Solu-Medrol twice a day - Supplemental oxygen, bronchodilators  - Chest x-ray showed chronic lung changes without acute findings  -  Continue Symbicort  -  Consider pulmonology consultation is this patient's had quick recurrence of his symptoms after his steroid taper   Chest discomfort, atypical  - Troponins negative  - Patient's main complaint is psychiatric in nature and suspect this may be playing a role in this transient chest discomfort  - Chest x-ray showed chronic lung changes without acute findings   Anxiety and depression contributed to admission. During the patient's previous admission, he was found to be extremely anxious and was started on SSRI, however this medication was not continued at the time of discharge.  -  Will await psychiatry recommendations for appropriate treatment of patient's depression and anxiety  Auditory hallucinations, may be related to steroid use or severe anxiety and depression -  Appreciate psychiatry assistance  CAD in native artery - LM, RCA, RI & Cx disease --> CABG x 4 (LIMA-LAD, SVG-OM/RI, SVG-rPDA-rPL)  S/P CABG x 4  - Continue statin, aspirin   Atrial fibrillation, chronic / persistent with intermittent bradycardia to the 30s alternating with tachycardia to the 110s - On last admission given bradycardia patient was taken off of beta blocker and currently not on rate controlling agent  - Pharmacy to manage Coumadin   Dyslipidemia, goal LDL below 70  - Stable continue statin  Diet:  Healthy heart Access:  PIV IVF:  Off Proph:  Warfarin  Code Status: Full Family Communication: Patient alone Disposition Plan: Pending improvement in dyspnea and  anxiety   Consultants:  Psychiatry  Procedures:  Chest x-ray  Antibiotics:  Levofloxacin from 12/16   HPI/Subjective:  Patient states that he is persistently wheezy and Roberto Reilly of breath, he states that his anxiety is improving. His auditory hallucinations have been slowly resolving. He has not had any musical sensation since last night.    Objective: Filed Vitals:   05/08/13 0445 05/08/13 0902 05/08/13 1405 05/08/13 1412  BP: 136/59  154/73   Pulse: 77  101   Temp: 97.7 F (36.5 C)  97.7 F (36.5 C)   TempSrc: Oral  Oral   Resp: 20  20   Height:      Weight:      SpO2: 98% 97% 98% 98%    Intake/Output Summary (Last 24 hours) at 05/08/13 1843 Last data filed at 05/08/13 1757  Gross per 24 hour  Intake   2915 ml  Output   1425 ml  Net   1490 ml   Filed Weights   05/07/13 1714  Weight: 98.9 kg (218 lb 0.6 oz)    Exam:   General:  Caucasian male, mild respiratory distress with tach tachypnea, intermittent SCM retractions, the patient is to posits to catch his breath between sentences  HEENT:  NCAT, MMM  Cardiovascular:  IRRR, nl S1, S2 no mrg, 2+ pulses, warm extremities  Respiratory:  Diminished bilateral breath sounds with high-pitched expiratory wheeze, no focal rales or rhonchi, no increased WOB  Abdomen:   NABS, soft, NT/ND  MSK:   Decreased tone and bulk, no LEE  Neuro:  Grossly intact  Data Reviewed: Basic Metabolic Panel:  Recent Labs Lab 05/07/13 1200 05/07/13 1949 05/08/13 0140  NA 135  --  134*  K  3.8  --  4.4  CL 95*  --  98  CO2 26  --  26  GLUCOSE 114*  --  129*  BUN 26*  --  29*  CREATININE 1.06  --  1.07  CALCIUM 9.4  --  8.6  MG  --  2.2  --   PHOS  --  3.0  --    Liver Function Tests:  Recent Labs Lab 05/07/13 1200  AST 20  ALT 26  ALKPHOS 49  BILITOT 1.3*  PROT 6.8  ALBUMIN 4.0   No results found for this basename: LIPASE, AMYLASE,  in the last 168 hours No results found for this basename: AMMONIA,  in the  last 168 hours CBC:  Recent Labs Lab 05/07/13 1200 05/08/13 0140  WBC 13.2* 11.2*  NEUTROABS 10.3*  --   HGB 17.6* 16.2  HCT 52.5* 48.0  MCV 89.1 88.2  PLT 141* 127*   Cardiac Enzymes:  Recent Labs Lab 05/07/13 1200 05/07/13 1949 05/08/13 0140  TROPONINI <0.30 <0.30 <0.30   BNP (last 3 results)  Recent Labs  04/29/13 1716 05/07/13 1200  PROBNP 445.2 760.7*   CBG: No results found for this basename: GLUCAP,  in the last 168 hours  Recent Results (from the past 240 hour(s))  CULTURE, EXPECTORATED SPUTUM-ASSESSMENT     Status: None   Collection Time    05/01/13 11:49 AM      Result Value Range Status   Specimen Description SPUTUM   Final   Special Requests NONE   Final   Sputum evaluation     Final   Value: THIS SPECIMEN IS ACCEPTABLE. RESPIRATORY CULTURE REPORT TO FOLLOW.   Report Status 05/01/2013 FINAL   Final  CULTURE, RESPIRATORY (NON-EXPECTORATED)     Status: None   Collection Time    05/01/13 11:49 AM      Result Value Range Status   Specimen Description SPUTUM   Final   Special Requests NONE   Final   Gram Stain     Final   Value: MODERATE WBC PRESENT, PREDOMINANTLY PMN     RARE SQUAMOUS EPITHELIAL CELLS PRESENT     FEW GRAM POSITIVE COCCI     IN PAIRS IN CLUSTERS     Performed at [facility]   Culture     Final   Value: NORMAL OROPHARYNGEAL FLORA     Performed at [facility]   Report Status 05/03/2013 FINAL   Final     Studies: Dg Chest 2 View  05/07/2013   CLINICAL DATA:  Shortness of breath and productive cough.  EXAM: CHEST  2 VIEW  COMPARISON:  04/29/2013  FINDINGS: There is chronic volume loss in the right hemithorax. No evidence for airspace disease or pulmonary edema. Post CABG changes in the chest. Heart size is grossly stable. There is a stable sclerotic density involving the left mid clavicle and this may be within the bone. Diffuse degenerative changes in the thoracic spine.  IMPRESSION: Chronic lung changes without  acute findings.   Electronically Signed   By: Richarda Overlie M.D.   On: 05/07/2013 12:58    Scheduled Meds: . albuterol  2.5 mg Nebulization Q6H  . amLODipine  10 mg Oral Daily  . aspirin EC  81 mg Oral Daily  . atorvastatin  20 mg Oral q1800  . budesonide-formoterol  2 puff Inhalation BID  . cholecalciferol  1,000 Units Oral Daily  . furosemide  40 mg Oral Daily  . levofloxacin (LEVAQUIN)  IV  750 mg Intravenous Q24H  . methylPREDNISolone (SOLU-MEDROL) injection  40 mg Intravenous Q12H  . potassium chloride SA  20 mEq Oral Daily  . sodium chloride  3 mL Intravenous Q12H  . sodium chloride  3 mL Intravenous Q12H  . tiotropium  18 mcg Inhalation Daily  . Warfarin - Pharmacist Dosing Inpatient   Does not apply q1800  . zolpidem  5 mg Oral QHS   Continuous Infusions:   Principal Problem:   COPD with acute exacerbation Active Problems:   Reactive depression (situational)   CAD in native artery - LM, RCA, RI & Cx disease --> CABG x 4 (LIMA-LAD, SVG-OM/RI, SVG-rPDA-rPL)   S/P CABG x 4   Atrial fibrillation, chronic / persistent   Dyslipidemia, goal LDL below 70   COPD (chronic obstructive pulmonary disease)   Chest discomfort    Time spent: 30 min    Roberto Reilly  [facility] Pager [phone]. If 7PM-7AM, please contact night-coverage at [email or web address], password [facility] 05/08/2013, 6:43 PM  LOS: 1 day

## 2013-05-08 NOTE — Progress Notes (Signed)
ANTICOAGULATION CONSULT NOTE - Follow Up Consult  Pharmacy Consult for warfarin Indication: atrial fibrillation  No Known Allergies  Patient Measurements: Height: 6' (182.9 cm) Weight: 218 lb 0.6 oz (98.9 kg) IBW/kg (Calculated) : 77.6 Heparin Dosing Weight:   Vital Signs: Temp: 97.7 F (36.5 C) (12/17 0445) Temp src: Oral (12/17 0445) BP: 136/59 mmHg (12/17 0445) Pulse Rate: 77 (12/17 0445)  Labs:  Recent Labs  05/07/13 1200 05/07/13 1949 05/08/13 0140  HGB 17.6*  --  16.2  HCT 52.5*  --  48.0  PLT 141*  --  127*  LABPROT  --  28.4* 31.3*  INR  --  2.78* 3.16*  CREATININE 1.06  --  1.07  TROPONINI <0.30 <0.30 <0.30    Estimated Creatinine Clearance: 70.4 ml/min (by C-G formula based on Cr of 1.07).  Assessment: 38 YOM admitted with AECOPD, warfarin resumed for afib.    Home warfarin regimen  =  Patient states takes 2.5mg  daily except 5mg  on Mon & Thur                =  Coumadin clinic note 11/26 states 2.5mg  daily 5mg  MWF  Today's INR = 3.16 (SUPRAtherapeutic)  Suspect rise due to acute illness, levofloxacin, and steroids  CBC: Hgb WNL, platelets = 127 (baseline 140-160)  Goal of Therapy:  INR 2-3 Monitor platelets by anticoagulation protocol: Yes   Plan:   INR slightly above goal, use lower dose tonight, give warfarin 1mg  PO x 1  Daily INR  Follow platelets  Juliette Alcide, PharmD, BCPS.   Pager: [phone]  05/08/2013,8:04 AM

## 2013-05-08 NOTE — Progress Notes (Signed)
1600: report received from E. Ali,RN. Will continue current plan of care. Roberto Reilly 

## 2013-05-08 NOTE — Progress Notes (Signed)
Clinical Social Work Department CLINICAL SOCIAL WORK PSYCHIATRY SERVICE LINE ASSESSMENT 05/08/2013  Patient:  Roberto Reilly  Account:  [id number]  Admit Date:  05/07/2013  Clinical Social Worker:  Unk Lightning, LCSW  Date/Time:  05/08/2013 12:00 N Referred by:  Physician  Date referred:  05/08/2013 Reason for Referral  Psychosocial assessment   Presenting Symptoms/Problems (In the person's/family's own words):   Psych consulted due to auditory hallucinations.   Abuse/Neglect/Trauma History (check all that apply)  Denies history   Abuse/Neglect/Trauma Comments:   Psychiatric History (check all that apply)  Outpatient treatment   Psychiatric medications:  Ambien 5 mg   Current Mental Health Hospitalizations/Previous Mental Health History:   Patient reports that he has struggled with anxiety problems for the past few years. Patient reports multiple stressors at home and reports that he sometimes feels he is having panic attacks but due to medical conditions is unsure if it is anxiety related or due to breathing problems.   Current provider:   PCP   Place and Date:   [place], [place]   Current Medications:   Scheduled Meds:      . albuterol  2.5 mg Nebulization Q6H  . amLODipine  10 mg Oral Daily  . aspirin EC  81 mg Oral Daily  . atorvastatin  20 mg Oral q1800  . budesonide-formoterol  2 puff Inhalation BID  . cholecalciferol  1,000 Units Oral Daily  . furosemide  40 mg Oral Daily  . levofloxacin (LEVAQUIN) IV  750 mg Intravenous Q24H  . methylPREDNISolone (SOLU-MEDROL) injection  40 mg Intravenous Q12H  . potassium chloride SA  20 mEq Oral Daily  . sodium chloride  3 mL Intravenous Q12H  . sodium chloride  3 mL Intravenous Q12H  . tiotropium  18 mcg Inhalation Daily  . warfarin  1 mg Oral ONCE-1800  . Warfarin - Pharmacist Dosing Inpatient   Does not apply q1800  . zolpidem  5 mg Oral QHS        Continuous Infusions:      PRN Meds:.sodium chloride, albuterol, morphine  injection, nitroGLYCERIN, ondansetron (ZOFRAN) IV, ondansetron, sodium chloride       Previous Impatient Admission/Date/Reason:   None reported   Emotional Health / Current Symptoms    Suicide/Self Harm  None reported   Suicide attempt in the past:   Patient denies any SI or HI. Patient denies any previous attempts.   Other harmful behavior:   None reported   Psychotic/Dissociative Symptoms  Auditory Hallucinations   Other Psychotic/Dissociative Symptoms:   Patient reports he started hearing music on December 8th. Patient reports he has heard the music off and on but has not heard any music since yesterday afternoon. Patient reports no other voices or visual hallucinations.    Attention/Behavioral Symptoms  Within Normal Limits   Other Attention / Behavioral Symptoms:   Patient engaged throughout assessment.    Cognitive Impairment  Within Normal Limits   Other Cognitive Impairment:   Patient alert and oriented.    Mood and Adjustment  Mood Congruent    Stress, Anxiety, Trauma, Any Recent Loss/Stressor  Anxiety   Anxiety (frequency):   Patient reports he worries about caring for his wife, paying for medical bills, being unable to afford prescriptions, and being worried about his own health.   Phobia (specify):   N/A   Compulsive behavior (specify):   N/A   Obsessive behavior (specify):   N/A   Other:   N/A   Substance Abuse/Use  None  SBIRT completed (please refer for detailed history):  N  Self-reported substance use:   Patient denies all substance use.   Urinary Drug Screen Completed:  Y Alcohol level:   <11    Environmental/Housing/Living Arrangement  Stable housing   Who is in the home:   Wife   Emergency contact:  Eleanor-spouse   Financial  Medicare   Patient's Strengths and Goals (patient's own words):   Patient reports that his family is supportive and he is hopeful that he will start feeling better.   Clinical Social  Worker's Interpretive Summary:   CSW received referral in order to complete psychosocial assessment. CSW reviewed chart and met with patient at bedside. CSW introduced myself and explained role.    Patient reports that he lives at home with wife and they have been married for 35 years. Patient reports that wife is 53 years old and has some dementia and he trying to care for her. Patient reports supportive family but feels overwhelmed.    Patient states that he was hospitalized on December 8th and heard music playing throughout his stay and then when he returned home. Patient denies any VH and reports that it is only music and no voices. Patient reports that he has not heard any since yesterday and wonders if it is related to medication that he was prescribed during last hospital stay.    Patient denies any previous MH diagnosis but reports that due to stress he has been feeling more anxious. Patient reports that he is stressed over medical bills from his hospital stay and his wife's bills as well. Patient reports he lives on fixed income and is unable to afford all of his medications. Patient also reports that due to breathing difficulties that he feels SOB and has chest pain. Patient does not believe these are panic attacks but related to breathing problems.    Patient reports that he does not want to see a psychiatrist or go through counseling because it is too much to handle his own appointments and caring for wife. Patient feels that PCP can manage his medications as well. Patient is interested in speaking with psych MD regarding medication options for anxiety.    Patient alert and oriented and engaged in assessment. Patient thanked CSW for time and reports that he is feeling better physically which is helping his emotional wellbeing. Patient is happy that he is not experiencing any AH at this time. Patient agreeable to continued follow up.   Disposition:  Recommend Psych CSW continuing to support  while in hospital   [place], [place] [phone]

## 2013-05-08 NOTE — Progress Notes (Signed)
Nutrition Brief Note  Patient identified on the Malnutrition Screening Tool (MST) Report  Wt Readings from Last 15 Encounters:  05/07/13 218 lb 0.6 oz (98.9 kg)  04/29/13 218 lb 12.8 oz (99.247 kg)  03/12/13 235 lb 3.2 oz (106.686 kg)  01/08/13 234 lb (106.142 kg)  09/06/12 241 lb (109.317 kg)  03/08/12 249 lb 6.4 oz (113.127 kg)  09/01/11 248 lb 12.8 oz (112.855 kg)  01/06/11 252 lb 12.8 oz (114.669 kg)  12/06/10 254 lb 9.6 oz (115.486 kg)  01/11/10 254 lb (115.214 kg)  07/13/09 269 lb 6.1 oz (122.191 kg)  12/03/08 265 lb 2.1 oz (120.263 kg)  05/26/08 263 lb (119.296 kg)  11/21/07 258 lb 6.1 oz (117.201 kg)  05/25/07 267 lb 8 oz (121.337 kg)    Body mass index is 29.56 kg/(m^2). Patient meets criteria for Overweight based on current BMI. Pt seen by RD during previous admission due to weight loss. Pt states his appetite has been good and he was eating well PTA. Weight has been stable since 12/9.   Current diet order is Heart Healthy, patient is consuming approximately 100% of meals at this time. Labs and medications reviewed.   No nutrition interventions warranted at this time. If nutrition issues arise, please consult RD.   Ian Malkin RD, LDN Inpatient Clinical Dietitian Pager: [phone] After Hours Pager: [phone]

## 2013-05-09 ENCOUNTER — Encounter (HOSPITAL_COMMUNITY): Payer: Self-pay | Admitting: Orthopedic Surgery

## 2013-05-09 DIAGNOSIS — R443 Hallucinations, unspecified: Secondary | ICD-10-CM

## 2013-05-09 DIAGNOSIS — F411 Generalized anxiety disorder: Secondary | ICD-10-CM

## 2013-05-09 DIAGNOSIS — I4891 Unspecified atrial fibrillation: Secondary | ICD-10-CM | POA: Diagnosis not present

## 2013-05-09 DIAGNOSIS — J441 Chronic obstructive pulmonary disease with (acute) exacerbation: Secondary | ICD-10-CM | POA: Diagnosis not present

## 2013-05-09 DIAGNOSIS — R440 Auditory hallucinations: Secondary | ICD-10-CM

## 2013-05-09 LAB — PROTIME-INR: INR: 3.5 — ABNORMAL HIGH (ref 0.00–1.49)

## 2013-05-09 MED ORDER — SERTRALINE HCL 25 MG PO TABS: 25.0000 mg | ORAL_TABLET | Freq: Every day | ORAL | Status: DC

## 2013-05-09 MED ORDER — PREDNISONE 10 MG PO TABS: ORAL_TABLET | ORAL | Status: DC

## 2013-05-09 MED ORDER — SERTRALINE HCL 25 MG PO TABS
25.0000 mg | ORAL_TABLET | Freq: Every day | ORAL | Status: DC
  Administered 2013-05-09: 15:00:00 25 mg via ORAL
  Filled 2013-05-09: qty 1

## 2013-05-09 MED ORDER — ATORVASTATIN CALCIUM 20 MG PO TABS: 20.0000 mg | ORAL_TABLET | Freq: Every day | ORAL | Status: DC

## 2013-05-09 NOTE — Progress Notes (Signed)
Pt unable to discharge until 8pm due to transportation needs. He has done well this shift.

## 2013-05-09 NOTE — Discharge Summary (Addendum)
Physician Discharge Summary  Roberto Reilly YNW:[id number] DOB: 04/10/1934 DOA: 05/07/2013  PCP: Juline Patch, MD  Admit date: 05/07/2013 Discharge date: 05/09/2013  Recommendations for Outpatient Follow-up:  1. [facility] care management services arranged 2. The pulmonology office will call the patient to schedule his followup appointment. 3. We will give the patient an additional prescription for steroid taper 4. Consider Zoloft per psychiatry recommendations >> given prescription to try.  If started, will need close INR checks as potentiates the effects of warfarin.   Discharge Diagnoses:  Principal Problem:   COPD with acute exacerbation Active Problems:   Reactive depression (situational)   CAD in native artery - LM, RCA, RI & Cx disease --> CABG x 4 (LIMA-LAD, SVG-OM/RI, SVG-rPDA-rPL)   S/P CABG x 4   Atrial fibrillation, chronic / persistent   Dyslipidemia, goal LDL below 70   COPD (chronic obstructive pulmonary disease)   Chest discomfort   Generalized anxiety disorder   Auditory hallucination   Discharge Condition: stable, improved  Diet recommendation: healthy heart  Wt Readings from Last 3 Encounters:  05/07/13 98.9 kg (218 lb 0.6 oz)  04/29/13 99.247 kg (218 lb 12.8 oz)  03/12/13 106.686 kg (235 lb 3.2 oz)    History of present illness:   Roberto Reilly is a 79 y.o. male  With history of reactive depression, COPD on home oxygen, dyslipidemia, coronary artery disease status post coronary artery bypass grafting. Patient presents with the above complaints which has started since this morning. States that he is under a lot of stress lately because of financial problems as well as relationship problems (not with his wife). As such he reports he developed some transient chest discomfort which was described as tightness but denies any radiation of the chest discomfort, diaphoresis, or nausea associated with it. The shortness of breath has been persistent since onset this a.m.  Nothing he is aware of makes it better or worse except for treatment administered in the ED.   In the emergency department patient was found to have a BNP of 760, a negative troponin, a negative d-dimer. Patient was recently discharged on 05/01/2013 and at that time was also treated for acute COPD exacerbation.    Hospital Course:   Acute on chronic hypoxic respiratory failure, likely due to COPD with acute exacerbation vs. Slow improvement from previous admission.  The patient was started on Solu-Medrol IV twice a day and levofloxacin. He was given dilators insulin oxygen. His chest x-ray demonstrated chronic lung changes without acute findings. He continued to Symbicort. He had improvement in his shortness of breath and wheezing and was able to ambulate on room air with oxygen saturations remaining 92% and above on the day of discharge. He has home oxygen already arranged which he uses nightly. He will followup with pulmonology in approximately 2 weeks.   Chest discomfort, atypical.   His troponins were negative. His EKG demonstrated no acute changes. His telemetry demonstrated sinus tachycardia.  Chest x-ray was negative for acute changes. It is also possible that the patient is having some anxiety induced chest discomfort.  His chest pain resolved.  Anxiety and depression contributed to admission. During the patient's previous admission, he was found to be extremely anxious and was started on SSRI, however this medication was not continued at the time of discharge.  he was seen by psychiatry who recommended starting Zoloft. The patient was given a prescription which he can fill the pharmacy.  Auditory hallucinations, may be related to steroid use or severe anxiety and  depression.  He had been hearing sounds that were like music for the last several weeks and his auditory hallucinations resolved during admission. He was seen by psychiatry who recommended no additional antipsychotic medications.    CAD in native artery - LM, RCA, RI & Cx disease --> CABG x 4 (LIMA-LAD, SVG-OM/RI, SVG-rPDA-rPL) S/P CABG x 4.   He continued an aspirin and statin.  He is not on beta blocker secondary to bradycardia in the 30s. He should followup with Doctor Herbie Baltimore in clinic within 1 month.  Atrial fibrillation, chronic / persistent with intermittent bradycardia to the 30s alternating with tachycardia to the 110s.   he is followed by cardiology.  He is not on any rate control medications. You is continued on Coumadin which was dosed per pharmacy. His INR on the day of discharge was 3.5. His INR was likely mildly elevated secondary to condom use of levofloxacin. If levofloxacin does have been discontinued. The patient is advised not to take his warfarin dose on the day of discharge and to resume his home dose the following day. He should have an INR checked in 4 days.  Dyslipidemia, goal LDL below 70.  Stable continued statin   Consultants:  Psychiatry Procedures:  Chest x-ray Antibiotics:  Levofloxacin from 12/16 >> 12/18   Discharge Exam: Filed Vitals:   05/09/13 1057  BP: 154/64  Pulse:   Temp:   Resp:    Filed Vitals:   05/09/13 0230 05/09/13 0610 05/09/13 0940 05/09/13 1057  BP:  108/53  154/64  Pulse:  76    Temp:  97.4 F (36.3 C)    TempSrc:  Oral    Resp:  18    Height:      Weight:      SpO2: 94% 100% 97%     General: Caucasian male, NAD on RA HEENT: NCAT, MMM  Cardiovascular: IRRR, nl S1, S2 no mrg, 2+ pulses, warm extremities  Respiratory: Diminished bilateral breath sounds, no wheezes, rales, or rhonchi, no increased WOB  Abdomen: NABS, soft, NT/ND  MSK: Decreased tone and bulk, no LEE  Neuro: Grossly intact   Discharge Instructions      Discharge Orders   Future Appointments Provider Department Dept Phone   05/14/2013 9:50 AM Phillips Hay, RPH-CPP [facility] [phone]   05/24/2013 11:00 AM Marykay Lex, MD [facility]  [phone]   08/19/2013 2:15 PM Sherrie George, MD [facility] [phone]   09/10/2013 11:00 AM Waymon Budge, MD [facility] [phone]   Future Orders Complete By Expires   Call MD for:  difficulty breathing, headache or visual disturbances  As directed    Call MD for:  extreme fatigue  As directed    Call MD for:  hives  As directed    Call MD for:  persistant dizziness or light-headedness  As directed    Call MD for:  persistant nausea and vomiting  As directed    Call MD for:  severe uncontrolled pain  As directed    Call MD for:  temperature >100.4  As directed    Diet - low sodium heart healthy  As directed    Discharge instructions  As directed    Comments:     You were hospitalized with shortness of breath, chest pain, anxiety, hallucinations.  Your tests for heart attack were negative, however, you will need to follow up with Dr. Herbie Baltimore at your already scheduled appointment on January 2nd.  You were started on steroids again for your COPD.  Please continue a steroid taper.  The pulmonology office with call you with your appointment time and date for follow up.  You were also seen by psychiatry who recommended starting zoloft.  I have sent the prescription to your pharmacy so that you can find out how much the prescription costs.  There is a drug-drug interaction between your amlodipine and simvastatin so I have changed your cholesterol medication to atorvastatin.  If you start to feel more Krisanne Lich of breath, please return to the hospital.  Your INR today was 3.5.  Please do NOT take your warfarin this evening (skip tonight's dose) and restart your warfarin tomorrow night at your previous dose.  Have your INR checked on Monday.   Increase activity slowly  As directed        Medication List    STOP taking these medications       simvastatin 40 MG tablet  Commonly known as:  ZOCOR      TAKE these medications       amLODipine 10 MG tablet   Commonly known as:  NORVASC  Take 1 tablet (10 mg total) by mouth daily.     aspirin 81 MG tablet  Take 81 mg by mouth daily.     atorvastatin 20 MG tablet  Commonly known as:  LIPITOR  Take 1 tablet (20 mg total) by mouth daily at 6 PM.     budesonide-formoterol 160-4.5 MCG/ACT inhaler  Commonly known as:  SYMBICORT  Inhale 2 puffs into the lungs 2 (two) times daily.     cholecalciferol 1000 UNITS tablet  Commonly known as:  VITAMIN D  Take 1,000 Units by mouth daily.     DUONEB 0.5-2.5 (3) MG/3ML Soln  Generic drug:  ipratropium-albuterol  Take 3 mLs by nebulization every 6 (six) hours as needed (wheezing and shortness of breath).     furosemide 40 MG tablet  Commonly known as:  LASIX  Take 40 mg by mouth daily.     potassium chloride SA 20 MEQ tablet  Commonly known as:  K-DUR,KLOR-CON  Take 20 mEq by mouth daily.     predniSONE 10 MG tablet  Commonly known as:  DELTASONE  6 tabs daily x 2 days, 5 tabs daily x 2 days, 4 tabs daily x 2 days, 3 tabs daily x 2 days, 2 tabs daily x 2 days, 1 tab daily x 2 days     sertraline 25 MG tablet  Commonly known as:  ZOLOFT  Take 1 tablet (25 mg total) by mouth daily.     warfarin 2.5 MG tablet  Commonly known as:  COUMADIN  Take 2.5-5 mg by mouth daily. Takes 1 tab (2.5mg ) daily except 2 tabs (5mg ) Mon/Thurs     zolpidem 10 MG tablet  Commonly known as:  AMBIEN  Take 5 mg by mouth at bedtime.       Follow-up Information   Follow up with PANG,RICHARD, MD. Schedule an appointment as soon as possible for a visit in 2 weeks.   Specialty:  Internal Medicine   Contact information:   [address] [place] [address] [phone]       Follow up with Waymon Budge, MD. (clinic will call with appointment time and date)    Specialty:  Pulmonary Disease   Contact information:   [address]  [facility] [place] [address] [phone]       Follow  up with Marykay Lex, MD.  (already scheduled appointment)    Specialty:  Cardiology   Contact information:   [address] [place] [address] [phone]        The results of significant diagnostics from this hospitalization (including imaging, microbiology, ancillary and laboratory) are listed below for reference.    Significant Diagnostic Studies: Dg Chest 2 View  05/07/2013   CLINICAL DATA:  Shortness of breath and productive cough.  EXAM: CHEST  2 VIEW  COMPARISON:  04/29/2013  FINDINGS: There is chronic volume loss in the right hemithorax. No evidence for airspace disease or pulmonary edema. Post CABG changes in the chest. Heart size is grossly stable. There is a stable sclerotic density involving the left mid clavicle and this may be within the bone. Diffuse degenerative changes in the thoracic spine.  IMPRESSION: Chronic lung changes without acute findings.   Electronically Signed   By: Richarda Overlie M.D.   On: 05/07/2013 12:58   Dg Chest 2 View  04/29/2013   CLINICAL DATA:  Cough, weakness  EXAM: CHEST  2 VIEW  COMPARISON:  03/12/2013  FINDINGS: Cardiomediastinal silhouette is stable. Status post CABG. Stable volume loss right lung. Stable chronic blunting of the right costophrenic angle. No acute infiltrate or pulmonary edema. Degenerative changes thoracic spine again noted.  IMPRESSION: No active disease.  No significant change.   Electronically Signed   By: Natasha Mead M.D.   On: 04/29/2013 13:15    Microbiology: Recent Results (from the past 240 hour(s))  CULTURE, EXPECTORATED SPUTUM-ASSESSMENT     Status: None   Collection Time    05/01/13 11:49 AM      Result Value Range Status   Specimen Description SPUTUM   Final   Special Requests NONE   Final   Sputum evaluation     Final   Value: THIS SPECIMEN IS ACCEPTABLE. RESPIRATORY CULTURE REPORT TO FOLLOW.   Report Status 05/01/2013 FINAL   Final  CULTURE, RESPIRATORY (NON-EXPECTORATED)     Status: None   Collection Time    05/01/13  11:49 AM      Result Value Range Status   Specimen Description SPUTUM   Final   Special Requests NONE   Final   Gram Stain     Final   Value: MODERATE WBC PRESENT, PREDOMINANTLY PMN     RARE SQUAMOUS EPITHELIAL CELLS PRESENT     FEW GRAM POSITIVE COCCI     IN PAIRS IN CLUSTERS     Performed at [facility]   Culture     Final   Value: NORMAL OROPHARYNGEAL FLORA     Performed at [facility]   Report Status 05/03/2013 FINAL   Final     Labs: Basic Metabolic Panel:  Recent Labs Lab 05/07/13 1200 05/07/13 1949 05/08/13 0140  NA 135  --  134*  K 3.8  --  4.4  CL 95*  --  98  CO2 26  --  26  GLUCOSE 114*  --  129*  BUN 26*  --  29*  CREATININE 1.06  --  1.07  CALCIUM 9.4  --  8.6  MG  --  2.2  --   PHOS  --  3.0  --    Liver Function Tests:  Recent Labs Lab 05/07/13 1200  AST 20  ALT 26  ALKPHOS 49  BILITOT 1.3*  PROT 6.8  ALBUMIN 4.0   No results found for this basename: LIPASE, AMYLASE,  in  the last 168 hours No results found for this basename: AMMONIA,  in the last 168 hours CBC:  Recent Labs Lab 05/07/13 1200 05/08/13 0140  WBC 13.2* 11.2*  NEUTROABS 10.3*  --   HGB 17.6* 16.2  HCT 52.5* 48.0  MCV 89.1 88.2  PLT 141* 127*   Cardiac Enzymes:  Recent Labs Lab 05/07/13 1200 05/07/13 1949 05/08/13 0140  TROPONINI <0.30 <0.30 <0.30   BNP: BNP (last 3 results)  Recent Labs  04/29/13 1716 05/07/13 1200  PROBNP 445.2 760.7*   CBG: No results found for this basename: GLUCAP,  in the last 168 hours  Time coordinating discharge: 45 minutes  Signed:  Mckyla Deckman  [facility] 05/09/2013, 1:36 PM

## 2013-05-09 NOTE — Progress Notes (Signed)
Came to visit patient at bedside to explain and discuss [facility] Care Management services. Consents obtained. Patient will receive post hospital discharge call and will be evaluated for monthly home visits. Left [facility] Care Management packet at bedside. Raiford Noble, MSN- Ed, RN,BSN- [facility] [phone]

## 2013-05-09 NOTE — Progress Notes (Signed)
ANTICOAGULATION CONSULT NOTE - Follow Up Consult  Pharmacy Consult for warfarin Indication: atrial fibrillation  No Known Allergies  Patient Measurements: Height: 6' (182.9 cm) Weight: 218 lb 0.6 oz (98.9 kg) IBW/kg (Calculated) : 77.6 Heparin Dosing Weight:   Vital Signs: Temp: 97.4 F (36.3 C) (12/18 0610) Temp src: Oral (12/18 0610) BP: 108/53 mmHg (12/18 0610) Pulse Rate: 76 (12/18 0610)  Labs:  Recent Labs  05/07/13 1200 05/07/13 1949 05/08/13 0140 05/09/13 0435  HGB 17.6*  --  16.2  --   HCT 52.5*  --  48.0  --   PLT 141*  --  127*  --   LABPROT  --  28.4* 31.3* 33.8*  INR  --  2.78* 3.16* 3.50*  CREATININE 1.06  --  1.07  --   TROPONINI <0.30 <0.30 <0.30  --     Estimated Creatinine Clearance: 70.4 ml/min (by C-G formula based on Cr of 1.07).  Assessment: 34 YOM admitted with AECOPD, warfarin resumed for afib.    Home warfarin regimen  =  Patient states takes 2.5mg  daily except 5mg  on Mon & Thur                =  Coumadin clinic note 11/26 states 2.5mg  daily 5mg  MWF  Today's INR = 3.50 (SUPRAtherapeutic) - lower dose of 1mg  given last night  Suspect rise due to acute illness, levofloxacin, and steroids  CBC: 12/17 - Hgb WNL, platelets = 127 (baseline 140-160)  Goal of Therapy:  INR 2-3 Monitor platelets by anticoagulation protocol: Yes   Plan:   INR trending up (possible interactions include levofloxacin and steroids) - hold warfarin tonight  INR did not trend down as expected using lower warfarin dose last evening  Daily INR  Follow platelets (no labs this am)  Juliette Alcide, PharmD, BCPS.   Pager: [phone]  05/09/2013,8:40 AM

## 2013-05-09 NOTE — Clinical Documentation Improvement (Signed)
Possible Clinical Conditions?  Acute Respiratory Failure Acute on Chronic Respiratory Failure Chronic Respiratory Failure Acute Respiratory Insufficiency   Other Condition Cannot Clinically Determine   Supporting Information: Risk Factors:CAD, Hx MI, COPD, qualifying for home O2 during sleep, Hypoxia chronic, on home O2    Signs & Symptoms:ED note:"presenting with shortness of breath" "increased shortness of breath x24 hours"   Diagnostics: BNP760, Overweight  Per RD,   Treatment:PROVENTIL inhal                   SYMBICORT inhal                   BREO  inhal                   DUONEB  neb                  predniSONE                Oxygen supplement during admissision  sats 94-100%  Thank You, Andy Gauss ,RN Clinical Documentation Specialist:  [phone]  [facility]- Health Information Management

## 2013-05-09 NOTE — Progress Notes (Signed)
PT Cancellation Note  Patient Details Name: Roberto Reilly MRN: [id number] DOB: 1933-11-25   Cancelled Treatment:    Reason Eval/Treat Not Completed: PT screened, no needs identified, will sign off (pt denied need for PT services at this time. He is patiently awaiting his discharge home. )   Rebeca Alert, MPT Pager: [phone]

## 2013-05-09 NOTE — Evaluation (Signed)
Occupational Therapy Evaluation Patient Details Name: Roberto Reilly MRN: [id number] DOB: 08-15-1933 Today's Date: 05/09/2013 Time: 8657-8469 OT Time Calculation (min): 21 min  OT Assessment / Plan / Recommendation History of present illness Roberto Reilly is a 79 y.o. male PMHx COPD with emphysema, atrial fibrillation on chronic anticoagulant (no cardiologist), CABG x4, HLD, scabies (last treated 2 weeks ago).S/P RLL lobectomy approximately 2000 I. Dr. Parks Ranger at [facility] (secondary to unknown malignancy). Date starting last Wednesday began to have increasing shortness of breath NOTE patient on home O2 at 2 L via [place] unable to get appt w/ Dr Ricki Miller until today. He is reported positive increased SOB, positive productive cough (green), negative fever, positive chills.   Denies hemoptysis. No abdominal pain. No chest pain. Positive DOE.  Attempt his inhalers at home without improvement in his symptoms. Dr. Ricki Miller today patient with 2 nebulized treatments without improvement in his symptoms. Since the ER for evaluation. Patient reports continuing mild to moderate ongoing shortness of breath. Currently on 2 L O2 via [place] and while sitting still feels negative SOB.   Clinical Impression   All education completed including energy conservation. No further OT needs.     OT Assessment  Patient does not need any further OT services    Follow Up Recommendations  No OT follow up    Barriers to Discharge      Equipment Recommendations  None recommended by OT    Recommendations for Other Services    Frequency       Precautions / Restrictions Precautions Precautions: None   Pertinent Vitals/Pain Difficulty getting good reading on dynamap after pt dressed. Once monitor showed good waveform, sats were 95% on RA.    ADL  Eating/Feeding: Independent Where Assessed - Eating/Feeding: Chair Grooming: Wash/dry hands;Set up Where Assessed - Grooming: Supported sitting Upper Body Bathing: Simulated;Chest;Right  arm;Left arm;Abdomen;Supervision/safety Where Assessed - Upper Body Bathing: Unsupported standing Lower Body Bathing: Simulated;Supervision/safety Where Assessed - Lower Body Bathing: Supported standing Upper Body Dressing: Performed;Supervision/safety Where Assessed - Upper Body Dressing: Unsupported sitting Lower Body Dressing: Performed;Supervision/safety Where Assessed - Lower Body Dressing: Supported sit to stand Toilet Transfer: Research scientist (life sciences) Method: Sit to stand Toileting - Architect and Hygiene: Simulated;Supervision/safety ADL Comments: Pt able to perform functional mobility in room to retrieve own items and dress. Practiced toilet transfer and simulated tub transfer and pt with no LOB. Pt cares for his wife at home. Discussed energy conservation techniques for PLB when feeling SOB, taking rest breaks PRN, sitting down when able to do some tasks, etc.     OT Diagnosis:    OT Problem List:   OT Treatment Interventions:     OT Goals(Current goals can be found in the care plan section) Acute Rehab OT Goals Patient Stated Goal: go home  Visit Information  Last OT Received On: 05/09/13 Assistance Needed: +1 History of Present Illness: Roberto Reilly is a 79 y.o. male PMHx COPD with emphysema, atrial fibrillation on chronic anticoagulant (no cardiologist), CABG x4, HLD, scabies (last treated 2 weeks ago).S/P RLL lobectomy approximately 2000 I. Dr. Parks Ranger at [facility] (secondary to unknown malignancy). Date starting last Wednesday began to have increasing shortness of breath NOTE patient on home O2 at 2 L via [place] unable to get appt w/ Dr Ricki Miller until today. He is reported positive increased SOB, positive productive cough (green), negative fever, positive chills.   Denies hemoptysis. No abdominal pain. No chest pain. Positive DOE.  Attempt his inhalers at home without improvement  in his symptoms. Dr. Ricki Miller today patient with 2 nebulized treatments  without improvement in his symptoms. Since the ER for evaluation. Patient reports continuing mild to moderate ongoing shortness of breath. Currently on 2 L O2 via [place] and while sitting still feels negative SOB.       Prior Functioning     Home Living Family/patient expects to be discharged to:: Private residence Living Arrangements: Spouse/significant other;Other (Comment) (pt is caregiver for wife) Type of Home: Apartment Home Access: Stairs to enter Entrance Stairs-Number of Steps: 1 Entrance Stairs-Rails: None Home Layout: One level Home Equipment: Grab bars - tub/shower Additional Comments: pt's daughter lives in [place] and can get groceries etc.  Pt does all home management; wife is able to do bADLs herself. Pt states he has standard toilet at home but 1 week ago when here documented is higher toilet.  Prior Function Level of Independence: Independent Comments: uses O2 intermittantly at baseline. Communication Communication: No difficulties         Vision/Perception     Cognition  Cognition Arousal/Alertness: Awake/alert Behavior During Therapy: WFL for tasks assessed/performed Overall Cognitive Status: Within Functional Limits for tasks assessed    Extremity/Trunk Assessment Upper Extremity Assessment Upper Extremity Assessment: Overall WFL for tasks assessed     Mobility Transfers Transfers: Sit to Stand;Stand to Sit Sit to Stand: 6: Modified independent (Device/Increase time);With upper extremity assist;From chair/3-in-1;From toilet Stand to Sit: 6: Modified independent (Device/Increase time);With upper extremity assist;To chair/3-in-1;To toilet     Exercise     Balance Balance Balance Assessed: Yes Dynamic Standing Balance Dynamic Standing - Balance Support: Left upper extremity supported Dynamic Standing - Level of Assistance: 5: Stand by assistance   End of Session OT - End of Session Activity Tolerance: Patient tolerated treatment well Patient  left: in chair;with call bell/phone within reach;with nursing/sitter in room  GO     Lennox Laity [phone] 05/09/2013, 11:09 AM

## 2013-05-14 ENCOUNTER — Ambulatory Visit: Payer: Medicare Other | Admitting: Pharmacist Clinician (PhC)/ Clinical Pharmacy Specialist

## 2013-05-19 ENCOUNTER — Other Ambulatory Visit: Payer: Self-pay | Admitting: Cardiology

## 2013-05-20 ENCOUNTER — Emergency Department (HOSPITAL_COMMUNITY): Payer: Medicare Other

## 2013-05-20 ENCOUNTER — Inpatient Hospital Stay (HOSPITAL_COMMUNITY)
Admission: EM | Admit: 2013-05-20 | Discharge: 2013-05-25 | DRG: 377 | Disposition: A | Discharge status: Home / Self Care | Payer: Medicare Other | Attending: Internal Medicine | Admitting: Internal Medicine

## 2013-05-20 ENCOUNTER — Inpatient Hospital Stay (HOSPITAL_COMMUNITY): Payer: Medicare Other

## 2013-05-20 ENCOUNTER — Encounter (HOSPITAL_COMMUNITY): Payer: Self-pay | Admitting: Emergency Medicine

## 2013-05-20 DIAGNOSIS — E669 Obesity, unspecified: Secondary | ICD-10-CM | POA: Diagnosis present

## 2013-05-20 DIAGNOSIS — D696 Thrombocytopenia, unspecified: Secondary | ICD-10-CM | POA: Diagnosis present

## 2013-05-20 DIAGNOSIS — I4891 Unspecified atrial fibrillation: Secondary | ICD-10-CM | POA: Diagnosis not present

## 2013-05-20 DIAGNOSIS — D62 Acute posthemorrhagic anemia: Secondary | ICD-10-CM | POA: Diagnosis present

## 2013-05-20 DIAGNOSIS — K648 Other hemorrhoids: Secondary | ICD-10-CM | POA: Diagnosis present

## 2013-05-20 DIAGNOSIS — K8020 Calculus of gallbladder without cholecystitis without obstruction: Secondary | ICD-10-CM | POA: Diagnosis present

## 2013-05-20 DIAGNOSIS — I369 Nonrheumatic tricuspid valve disorder, unspecified: Secondary | ICD-10-CM | POA: Diagnosis not present

## 2013-05-20 DIAGNOSIS — Z66 Do not resuscitate: Secondary | ICD-10-CM | POA: Diagnosis present

## 2013-05-20 DIAGNOSIS — I129 Hypertensive chronic kidney disease with stage 1 through stage 4 chronic kidney disease, or unspecified chronic kidney disease: Secondary | ICD-10-CM | POA: Diagnosis present

## 2013-05-20 DIAGNOSIS — Z951 Presence of aortocoronary bypass graft: Secondary | ICD-10-CM | POA: Diagnosis not present

## 2013-05-20 DIAGNOSIS — K5730 Diverticulosis of large intestine without perforation or abscess without bleeding: Secondary | ICD-10-CM | POA: Diagnosis present

## 2013-05-20 DIAGNOSIS — J9610 Chronic respiratory failure, unspecified whether with hypoxia or hypercapnia: Secondary | ICD-10-CM | POA: Diagnosis present

## 2013-05-20 DIAGNOSIS — D649 Anemia, unspecified: Secondary | ICD-10-CM

## 2013-05-20 DIAGNOSIS — J441 Chronic obstructive pulmonary disease with (acute) exacerbation: Secondary | ICD-10-CM | POA: Diagnosis not present

## 2013-05-20 DIAGNOSIS — R791 Abnormal coagulation profile: Secondary | ICD-10-CM | POA: Diagnosis present

## 2013-05-20 DIAGNOSIS — Z9981 Dependence on supplemental oxygen: Secondary | ICD-10-CM

## 2013-05-20 DIAGNOSIS — K761 Chronic passive congestion of liver: Secondary | ICD-10-CM | POA: Diagnosis present

## 2013-05-20 DIAGNOSIS — E785 Hyperlipidemia, unspecified: Secondary | ICD-10-CM | POA: Diagnosis present

## 2013-05-20 DIAGNOSIS — Z6830 Body mass index (BMI) 30.0-30.9, adult: Secondary | ICD-10-CM

## 2013-05-20 DIAGNOSIS — J9819 Other pulmonary collapse: Secondary | ICD-10-CM | POA: Diagnosis not present

## 2013-05-20 DIAGNOSIS — K625 Hemorrhage of anus and rectum: Secondary | ICD-10-CM

## 2013-05-20 DIAGNOSIS — I5033 Acute on chronic diastolic (congestive) heart failure: Secondary | ICD-10-CM | POA: Diagnosis not present

## 2013-05-20 DIAGNOSIS — I714 Abdominal aortic aneurysm, without rupture, unspecified: Secondary | ICD-10-CM | POA: Diagnosis not present

## 2013-05-20 DIAGNOSIS — IMO0002 Reserved for concepts with insufficient information to code with codable children: Secondary | ICD-10-CM

## 2013-05-20 DIAGNOSIS — Z7901 Long term (current) use of anticoagulants: Secondary | ICD-10-CM

## 2013-05-20 DIAGNOSIS — I252 Old myocardial infarction: Secondary | ICD-10-CM | POA: Diagnosis not present

## 2013-05-20 DIAGNOSIS — Z79899 Other long term (current) drug therapy: Secondary | ICD-10-CM | POA: Diagnosis not present

## 2013-05-20 DIAGNOSIS — I2510 Atherosclerotic heart disease of native coronary artery without angina pectoris: Secondary | ICD-10-CM | POA: Diagnosis present

## 2013-05-20 DIAGNOSIS — Z8701 Personal history of pneumonia (recurrent): Secondary | ICD-10-CM

## 2013-05-20 DIAGNOSIS — K5731 Diverticulosis of large intestine without perforation or abscess with bleeding: Secondary | ICD-10-CM | POA: Diagnosis not present

## 2013-05-20 DIAGNOSIS — I509 Heart failure, unspecified: Secondary | ICD-10-CM | POA: Diagnosis present

## 2013-05-20 DIAGNOSIS — I4510 Unspecified right bundle-branch block: Secondary | ICD-10-CM | POA: Diagnosis not present

## 2013-05-20 DIAGNOSIS — F411 Generalized anxiety disorder: Secondary | ICD-10-CM | POA: Diagnosis not present

## 2013-05-20 DIAGNOSIS — J449 Chronic obstructive pulmonary disease, unspecified: Secondary | ICD-10-CM | POA: Diagnosis not present

## 2013-05-20 DIAGNOSIS — I480 Paroxysmal atrial fibrillation: Secondary | ICD-10-CM | POA: Diagnosis present

## 2013-05-20 DIAGNOSIS — K5900 Constipation, unspecified: Secondary | ICD-10-CM | POA: Diagnosis present

## 2013-05-20 DIAGNOSIS — Z87891 Personal history of nicotine dependence: Secondary | ICD-10-CM

## 2013-05-20 DIAGNOSIS — F3289 Other specified depressive episodes: Secondary | ICD-10-CM | POA: Diagnosis present

## 2013-05-20 DIAGNOSIS — J438 Other emphysema: Secondary | ICD-10-CM | POA: Diagnosis present

## 2013-05-20 DIAGNOSIS — F329 Major depressive disorder, single episode, unspecified: Secondary | ICD-10-CM | POA: Diagnosis present

## 2013-05-20 DIAGNOSIS — I10 Essential (primary) hypertension: Secondary | ICD-10-CM | POA: Diagnosis present

## 2013-05-20 DIAGNOSIS — R062 Wheezing: Secondary | ICD-10-CM | POA: Diagnosis not present

## 2013-05-20 DIAGNOSIS — K922 Gastrointestinal hemorrhage, unspecified: Secondary | ICD-10-CM | POA: Diagnosis present

## 2013-05-20 DIAGNOSIS — R0602 Shortness of breath: Secondary | ICD-10-CM | POA: Diagnosis not present

## 2013-05-20 DIAGNOSIS — Z8249 Family history of ischemic heart disease and other diseases of the circulatory system: Secondary | ICD-10-CM

## 2013-05-20 DIAGNOSIS — I482 Chronic atrial fibrillation, unspecified: Secondary | ICD-10-CM

## 2013-05-20 HISTORY — DX: Diverticulosis of large intestine without perforation or abscess with bleeding: K57.31

## 2013-05-20 LAB — CBC WITH DIFFERENTIAL/PLATELET
Basophils Absolute: 0 10*3/uL (ref 0.0–0.1)
Eosinophils Absolute: 0 10*3/uL (ref 0.0–0.7)
Eosinophils Relative: 1 % (ref 0–5)
HCT: 29.5 % — ABNORMAL LOW (ref 39.0–52.0)
Hemoglobin: 10 g/dL — ABNORMAL LOW (ref 13.0–17.0)
Lymphs Abs: 1.5 10*3/uL (ref 0.7–4.0)
MCH: 30.2 pg (ref 26.0–34.0)
MCV: 89.1 fL (ref 78.0–100.0)
Monocytes Relative: 6 % (ref 3–12)
Neutrophils Relative %: 75 % (ref 43–77)
Platelets: 111 10*3/uL — ABNORMAL LOW (ref 150–400)
RBC: 3.31 MIL/uL — ABNORMAL LOW (ref 4.22–5.81)
RDW: 14.8 % (ref 11.5–15.5)
WBC: 8.3 10*3/uL (ref 4.0–10.5)

## 2013-05-20 LAB — BASIC METABOLIC PANEL
BUN: 40 mg/dL — ABNORMAL HIGH (ref 6–23)
CO2: 25 mEq/L (ref 19–32)
Chloride: 98 mEq/L (ref 96–112)
Creatinine, Ser: 1.24 mg/dL (ref 0.50–1.35)
Glucose, Bld: 106 mg/dL — ABNORMAL HIGH (ref 70–99)

## 2013-05-20 LAB — HEMOGLOBIN AND HEMATOCRIT, BLOOD: Hemoglobin: 11.8 g/dL — ABNORMAL LOW (ref 13.0–17.0)

## 2013-05-20 LAB — CBC
HCT: 37.9 % — ABNORMAL LOW (ref 39.0–52.0)
Hemoglobin: 12.7 g/dL — ABNORMAL LOW (ref 13.0–17.0)
MCH: 29.7 pg (ref 26.0–34.0)
MCHC: 33.5 g/dL (ref 30.0–36.0)
MCV: 88.8 fL (ref 78.0–100.0)
RDW: 14.9 % (ref 11.5–15.5)

## 2013-05-20 LAB — POCT I-STAT TROPONIN I: Troponin i, poc: 0.02 ng/mL (ref 0.00–0.08)

## 2013-05-20 LAB — BLOOD GAS, ARTERIAL
Acid-Base Excess: 3.1 mmol/L — ABNORMAL HIGH (ref 0.0–2.0)
Bicarbonate: 25.9 mEq/L — ABNORMAL HIGH (ref 20.0–24.0)
Drawn by: 307971
O2 Saturation: 97.5 %
pCO2 arterial: 34.6 mmHg — ABNORMAL LOW (ref 35.0–45.0)
pH, Arterial: 7.487 — ABNORMAL HIGH (ref 7.350–7.450)
pO2, Arterial: 94.3 mmHg (ref 80.0–100.0)

## 2013-05-20 LAB — PROTIME-INR
INR: 4.68 — ABNORMAL HIGH (ref 0.00–1.49)
Prothrombin Time: 42.2 seconds — ABNORMAL HIGH (ref 11.6–15.2)

## 2013-05-20 LAB — PRO B NATRIURETIC PEPTIDE: Pro B Natriuretic peptide (BNP): 425.6 pg/mL (ref 0–450)

## 2013-05-20 MED ORDER — ZOLPIDEM TARTRATE 5 MG PO TABS
5.0000 mg | ORAL_TABLET | Freq: Every day | ORAL | Status: DC
  Administered 2013-05-20 – 2013-05-24 (×5): 5 mg via ORAL
  Filled 2013-05-20 (×5): qty 1

## 2013-05-20 MED ORDER — SERTRALINE HCL 25 MG PO TABS
25.0000 mg | ORAL_TABLET | Freq: Every day | ORAL | Status: DC
  Administered 2013-05-21 – 2013-05-25 (×5): 25 mg via ORAL
  Filled 2013-05-20 (×6): qty 1

## 2013-05-20 MED ORDER — ONDANSETRON HCL 4 MG/2ML IJ SOLN: 4.0000 mg | Freq: Four times a day (QID) | INTRAMUSCULAR | Status: DC | PRN

## 2013-05-20 MED ORDER — POTASSIUM CHLORIDE CRYS ER 20 MEQ PO TBCR
20.0000 meq | EXTENDED_RELEASE_TABLET | Freq: Every day | ORAL | Status: DC
  Administered 2013-05-21 – 2013-05-25 (×5): 20 meq via ORAL
  Filled 2013-05-20 (×6): qty 1

## 2013-05-20 MED ORDER — IOHEXOL 300 MG/ML  SOLN
50.0000 mL | Freq: Once | INTRAMUSCULAR | Status: AC | PRN
  Administered 2013-05-20: 50 mL via ORAL

## 2013-05-20 MED ORDER — SODIUM CHLORIDE 0.9 % IJ SOLN
3.0000 mL | Freq: Two times a day (BID) | INTRAMUSCULAR | Status: DC
  Administered 2013-05-20 – 2013-05-25 (×10): 3 mL via INTRAVENOUS

## 2013-05-20 MED ORDER — VITAMIN K1 10 MG/ML IJ SOLN
10.0000 mg | Freq: Once | INTRAVENOUS | Status: AC
  Administered 2013-05-20: 23:00:00 10 mg via INTRAVENOUS
  Filled 2013-05-20 (×2): qty 1

## 2013-05-20 MED ORDER — AMLODIPINE BESYLATE 10 MG PO TABS
10.0000 mg | ORAL_TABLET | Freq: Every day | ORAL | Status: DC
  Administered 2013-05-21 – 2013-05-25 (×5): 10 mg via ORAL
  Filled 2013-05-20 (×5): qty 1

## 2013-05-20 MED ORDER — DOCUSATE SODIUM 100 MG PO CAPS
100.0000 mg | ORAL_CAPSULE | Freq: Two times a day (BID) | ORAL | Status: DC
  Administered 2013-05-20 – 2013-05-25 (×8): 100 mg via ORAL
  Filled 2013-05-20 (×12): qty 1

## 2013-05-20 MED ORDER — ONDANSETRON HCL 4 MG PO TABS: 4.0000 mg | ORAL_TABLET | Freq: Four times a day (QID) | ORAL | Status: DC | PRN

## 2013-05-20 MED ORDER — IOHEXOL 300 MG/ML  SOLN
100.0000 mL | Freq: Once | INTRAMUSCULAR | Status: AC | PRN
  Administered 2013-05-20: 100 mL via INTRAVENOUS

## 2013-05-20 MED ORDER — BUDESONIDE-FORMOTEROL FUMARATE 160-4.5 MCG/ACT IN AERO
2.0000 | INHALATION_SPRAY | Freq: Two times a day (BID) | RESPIRATORY_TRACT | Status: DC
  Administered 2013-05-20 – 2013-05-25 (×7): 2 via RESPIRATORY_TRACT
  Filled 2013-05-20: qty 6

## 2013-05-20 MED ORDER — PANTOPRAZOLE SODIUM 40 MG IV SOLR
40.0000 mg | Freq: Two times a day (BID) | INTRAVENOUS | Status: DC
  Administered 2013-05-20 – 2013-05-25 (×10): 40 mg via INTRAVENOUS
  Filled 2013-05-20 (×12): qty 40

## 2013-05-20 MED ORDER — ALBUTEROL SULFATE (2.5 MG/3ML) 0.083% IN NEBU
5.0000 mg | INHALATION_SOLUTION | Freq: Once | RESPIRATORY_TRACT | Status: AC
  Administered 2013-05-20: 5 mg via RESPIRATORY_TRACT
  Filled 2013-05-20: qty 6

## 2013-05-20 MED ORDER — CLONAZEPAM 0.5 MG PO TABS: 0.5000 mg | ORAL_TABLET | Freq: Three times a day (TID) | ORAL | Status: DC | PRN

## 2013-05-20 MED ORDER — IPRATROPIUM-ALBUTEROL 0.5-2.5 (3) MG/3ML IN SOLN: 3.0000 mL | RESPIRATORY_TRACT | Status: DC | PRN

## 2013-05-20 NOTE — ED Provider Notes (Addendum)
CSN: [id number]     Arrival date & time 05/20/13  1439 History   First MD Initiated Contact with Patient 05/20/13 1500     Chief Complaint  Patient presents with  . Shortness of Breath   Note: Pt is a poor historian, difficult to get history of what brought him in today, tells me more about his prior hospitalization, and his financial issues HPI Pt is complaining of shortness of breath, anxiety and shakiness.  Pt has had some recent trouble with two ed visits in the last month.  The symptoms have been ongoing for the last month.  He states he started feeling bad about 12 days ago.  He has been taking breathing treatments, steroids and abx.  He was starting to get a little better after a two day hospitalization from the 16th to the 18th.  After leaving he started feeling poorly again.  He has been getting very anxious as well and has been having stress with his finances, and his wife's health.  He is also having episodes where he is hearing music that is not there.  Pt feels short of breath and stressed.  He feels weak all over and has trouble taking care himself.  He is also very scares.  He says he needs something to calm him down.  Past Medical History  Diagnosis Date  . History of MI (myocardial infarction) March 2007    Cath showed multivessel disease, referred for CABG  . CAD in native artery March 2007    Cath for dyspnea on exertion: 75% distal LM, 85% RI, 95% mid-distal Cx, in multiple RCA lesions with 95% distal. --> Referred for CABG  . S/P CABG x 24 July 2005    LIMA-LAD, SVG-OM, seq SVG-PDA -PLB  . Atrial fibrillation, chronic     Anticoagulated on warfarin. Stable -- post op  . H/O echocardiogram March 2007    Normal LV size and cavity appeared normal function. Grade 1 diastolic dysfunction. Limited study.  . Obesity (BMI 30-39.9)     One year ago weighed 265 pounds, (12/2012) -- now 234 pounds  . Dyslipidemia, goal LDL below 70   . COPD (chronic obstructive pulmonary disease)  with emphysema 05/23/2007    Hosp 5/29-6/01/12- COPD exacerbation ONOX 12/08/10- desaturated to less than 88% for over an hour, qualifying for home O2 during sleep    . Hypoxia      chronic, on home O2  . History of pneumonia   . Seasonal allergies    Past Surgical History  Procedure Laterality Date  . Cataract extraction      Laser  . Rll resection for hamartoma    . Rul for hamartoma    . Coronary artery bypass graft    . Cardiac catheterization  03 28 2007    NORMAL LV FUNCTION/ ABDOMINAL AORTA STENOSIS,75%-85%. RIGHT FEMORAL ARTERY :CATHETERS USED A  4-FRENCH WITH A 4-FRENCH SHEATH   Family History  Problem Relation Age of Onset  . Heart attack Mother   . Heart attack Father    History  Substance Use Topics  . Smoking status: Former Smoker -- 2.00 packs/day for 55 years    Types: Cigarettes    Quit date: 05/23/1998  . Smokeless tobacco: Never Used  . Alcohol Use: No    Review of Systems  Constitutional: Negative for fever and chills.  Cardiovascular: Negative for chest pain.  Gastrointestinal: Positive for blood in stool. Negative for vomiting and diarrhea.  Endocrine: Negative for polyuria.  Neurological: Positive for weakness. Negative for headaches.  Psychiatric/Behavioral: Positive for hallucinations. Negative for confusion.  All other systems reviewed and are negative.    Allergies  Review of patient's allergies indicates no known allergies.  Home Medications   Current Outpatient Rx  Name  Route  Sig  Dispense  Refill  . amLODipine (NORVASC) 10 MG tablet   Oral   Take 1 tablet (10 mg total) by mouth daily.   30 tablet   1   . aspirin 81 MG tablet   Oral   Take 81 mg by mouth daily.           Marland Kitchen atorvastatin (LIPITOR) 20 MG tablet   Oral   Take 1 tablet (20 mg total) by mouth daily at 6 PM.   30 tablet   0   . budesonide-formoterol (SYMBICORT) 160-4.5 MCG/ACT inhaler   Inhalation   Inhale 2 puffs into the lungs 2 (two) times daily.   3  Inhaler   3     Symbicort 160 (lot) [id number], exp 02, 2014   . cholecalciferol (VITAMIN D) 1000 UNITS tablet   Oral   Take 1,000 Units by mouth daily.         . clonazePAM (KLONOPIN) 0.5 MG tablet   Oral   Take 0.5 mg by mouth 3 (three) times daily as needed for anxiety.          . furosemide (LASIX) 40 MG tablet      TAKE ONE TABLET BY MOUTH EVERY MORNING   90 tablet   2     CYCLE FILL MEDICATION. Authorization is required f ...   . ipratropium-albuterol (DUONEB) 0.5-2.5 (3) MG/3ML SOLN   Nebulization   Take 3 mLs by nebulization every 6 (six) hours as needed (wheezing and shortness of breath).          . potassium chloride SA (K-DUR,KLOR-CON) 20 MEQ tablet   Oral   Take 20 mEq by mouth daily.         . sertraline (ZOLOFT) 25 MG tablet   Oral   Take 1 tablet (25 mg total) by mouth daily.   30 tablet   0   . warfarin (COUMADIN) 2.5 MG tablet   Oral   Take 2.5-5 mg by mouth daily. Takes 1 tab (2.5mg ) daily except 2 tabs (5mg ) Mon/Thurs         . zolpidem (AMBIEN) 10 MG tablet   Oral   Take 5 mg by mouth at bedtime.          . predniSONE (DELTASONE) 10 MG tablet      6 tabs daily x 2 days, 5 tabs daily x 2 days, 4 tabs daily x 2 days, 3 tabs daily x 2 days, 2 tabs daily x 2 days, 1 tab daily x 2 days   42 tablet   0    BP 154/130  Pulse 98  Temp(Src) 98 F (36.7 C) (Oral)  Resp 20  SpO2 100% Physical Exam  Nursing note and vitals reviewed. Constitutional: No distress.  HENT:  Head: Normocephalic and atraumatic.  Right Ear: External ear normal.  Left Ear: External ear normal.  Mouth/Throat: No oropharyngeal exudate.  Eyes: Conjunctivae are normal. Right eye exhibits no discharge. Left eye exhibits no discharge. No scleral icterus.  Neck: Neck supple. No tracheal deviation present.  Cardiovascular: Normal rate, regular rhythm and intact distal pulses.   Pulmonary/Chest: Effort normal and breath sounds normal. No stridor. No respiratory  distress. He has  no wheezes. He has no rales.  Few ronchi and rales, pursed lip breathing   Abdominal: Soft. Bowel sounds are normal. He exhibits no distension. There is no tenderness. There is no rebound and no guarding.  Genitourinary: Rectal exam shows no mass and no tenderness. Guaiac positive stool.  Musculoskeletal: He exhibits no edema and no tenderness.  Neurological: He is alert. He has normal strength. No sensory deficit. Cranial nerve deficit:  no gross defecits noted. He exhibits normal muscle tone. He displays no seizure activity. Coordination normal.  Skin: Skin is warm and dry. No rash noted. He is not diaphoretic.  Psychiatric: His mood appears anxious. His speech is not rapid and/or pressured. He is not agitated and not aggressive. He exhibits a depressed mood. He expresses no suicidal ideation. He expresses no suicidal plans.    ED Course  Procedures (including critical care time) EKG  Rate: 103  Rhythm: probable a fib  QRS Axis: normal  Intervals: normal  ST/T Wave abnormalities: normal  Conduction Disutrbances: Right bundle branch block   Narrative Interpretation: Multiple PVCs, poor quality, artifact  Old EKG Reviewed: No prior EKG for comparison Labs Review Labs Reviewed  BASIC METABOLIC PANEL - Abnormal; Notable for the following:    Glucose, Bld 106 (*)    BUN 40 (*)    GFR calc non Af Amer 54 (*)    GFR calc Af Amer 63 (*)    All other components within normal limits  CBC - Abnormal; Notable for the following:    WBC 11.1 (*)    Hemoglobin 12.7 (*)    HCT 37.9 (*)    Platelets 130 (*)    All other components within normal limits  BLOOD GAS, ARTERIAL - Abnormal; Notable for the following:    pH, Arterial 7.487 (*)    pCO2 arterial 34.6 (*)    Bicarbonate 25.9 (*)    Acid-Base Excess 3.1 (*)    All other components within normal limits  PROTIME-INR - Abnormal; Notable for the following:    Prothrombin Time 42.2 (*)    INR 4.68 (*)    All other  components within normal limits  OCCULT BLOOD, POC DEVICE - Abnormal; Notable for the following:    Fecal Occult Bld POSITIVE (*)    All other components within normal limits  PRO B NATRIURETIC PEPTIDE  POCT I-STAT TROPONIN I   Imaging Review Dg Chest 2 View (if Patient Has Fever And/or Copd)  05/20/2013   CLINICAL DATA:  Shortness of breath.  COPD.  Atrial fibrillation.  EXAM: CHEST  2 VIEW  COMPARISON:  05/07/2013  FINDINGS: The patient is rotated to the Right on today's radiograph, reducing diagnostic sensitivity and specificity. Prior CABG noted. Continued chronic a obscuration of the right hemidiaphragmatic margin. Bandlike airspace opacity medially at the right lung base. Prior right rib fractures and suspected right rib osteotomy.  Thoracic spondylosis. No significant blunting of the posterior costophrenic angles.  IMPRESSION: 1. Increased atelectasis in the right lower lobe medially. 2. Chronic blunting of the right costophrenic angle with remote right rib fractures and osteotomies.   Electronically Signed   By: Herbie Baltimore M.D.   On: 05/20/2013 15:50    EKG Interpretation   None      ABG pH 7.487 PCO2 34 PO2 94.3 oxygen saturation 97.5 Medications  albuterol (PROVENTIL) (2.5 MG/3ML) 0.083% nebulizer solution 5 mg (5 mg Nebulization Given 05/20/13 1612)    MDM   1. COPD (chronic obstructive pulmonary disease)  2. Generalized anxiety disorder   3. GI bleeding      Hgb has dropped from 16 to 12.  Guaic positive stools.  Pt has a component of GI bleeding that may be exacerbating his symptoms.  Will hold his coumadin.  Consult with medicine for observation admission.  Serial HCT.  Possible GI consult.   His shortness of breath is likely related to his copd.  No def pneumonia or interval chf development.  His anxiety may be a contributing factor as well.  Discussed with Dr Haroldine Laws who will evaluate.  Requests repeat CBC   Celene Kras, MD 05/20/13 1816  Consulted Dr  Christella Hartigan, GI as requested by Dr Haroldine Laws.  He will consult on patient.  Pt may need coumadin reversal.  Does not recommend bleeding scan at this time.  Celene Kras, MD 05/20/13 2040

## 2013-05-20 NOTE — ED Notes (Signed)
Will get admit lab orders when pt returns from CT. RN aware

## 2013-05-20 NOTE — ED Notes (Signed)
MD at bedside. 

## 2013-05-20 NOTE — ED Notes (Signed)
Per EMS pt with Hx of COPD and partial lobectomy complains of SOB, has been hospitalized twice in the past month with COPD exacerbation and bronchitis, was sent home about 11 days ago with abx and prednisone, has two pills left of prednisone and is done with abx. Pt reports some side effects of prednisone- auditory hallucinations - per EMS. Per EMS patient has wheezing and diminished lung sounds and anxiety. EMS administered 1 albuterol treatment en route.

## 2013-05-20 NOTE — Telephone Encounter (Signed)
Rx was sent to pharmacy electronically. 

## 2013-05-20 NOTE — H&P (Addendum)
PCP:   Juline Patch, MD   Chief Complaint:  anxiety  HPI: This is a 79 y/o gentleman with h/o chronic respiratory failure due to COPD as well as chronic anxiety. He presents to the ER with c/o shaking related to his anxiety. He takes a pill at home every 12hrs to treat his anxiety apparently it has been waring off early over the holiday season. He has been calling his PCP who is on vacation, his nurse who cannot prescribe benzo's finally sent him to the ER today. He does not report any worsening respiratory issues which is at baseline. He has poor respiratory function at baseline. The ER physician noted his hemoglobin dropped from 16 to 12 since the 17th and that caused concern. The patient is on coumadin for atrial fibrillation. He reports some blood on his tissue after forcing a BM, this has occurred occasionally over the last month. He reports no overt bleeding. He has never had a colonoscopy, he has never been anemic. The hospitalist were asked to see him.  Review of Systems:  The patient denies anorexia, fever, weight loss,, vision loss, decreased hearing, hoarseness, chest pain, syncope, dyspnea on exertion, peripheral edema, balance deficits, hemoptysis, abdominal pain, melena, hematochezia, severe indigestion/heartburn, hematuria, incontinence, genital sores, muscle weakness, suspicious skin lesions, transient blindness, difficulty walking, depression, unusual weight change, abnormal bleeding, enlarged lymph nodes, angioedema, and breast masses.  Past Medical History: Past Medical History  Diagnosis Date  . History of MI (myocardial infarction) March 2007    Cath showed multivessel disease, referred for CABG  . CAD in native artery March 2007    Cath for dyspnea on exertion: 75% distal LM, 85% RI, 95% mid-distal Cx, in multiple RCA lesions with 95% distal. --> Referred for CABG  . S/P CABG x 24 July 2005    LIMA-LAD, SVG-OM, seq SVG-PDA -PLB  . Atrial fibrillation, chronic    Anticoagulated on warfarin. Stable -- post op  . H/O echocardiogram March 2007    Normal LV size and cavity appeared normal function. Grade 1 diastolic dysfunction. Limited study.  . Obesity (BMI 30-39.9)     One year ago weighed 265 pounds, (12/2012) -- now 234 pounds  . Dyslipidemia, goal LDL below 70   . COPD (chronic obstructive pulmonary disease) with emphysema 05/23/2007    Hosp 5/29-6/01/12- COPD exacerbation ONOX 12/08/10- desaturated to less than 88% for over an hour, qualifying for home O2 during sleep    . Hypoxia      chronic, on home O2  . History of pneumonia   . Seasonal allergies    Past Surgical History  Procedure Laterality Date  . Cataract extraction      Laser  . Rll resection for hamartoma    . Rul for hamartoma    . Coronary artery bypass graft    . Cardiac catheterization  03 28 2007    NORMAL LV FUNCTION/ ABDOMINAL AORTA STENOSIS,75%-85%. RIGHT FEMORAL ARTERY :CATHETERS USED A  4-FRENCH WITH A 4-FRENCH SHEATH    Medications: Prior to Admission medications   Medication Sig Start Date End Date Taking? Authorizing Provider  amLODipine (NORVASC) 10 MG tablet Take 1 tablet (10 mg total) by mouth daily. 05/01/13  Yes Dorothea Ogle, MD  aspirin 81 MG tablet Take 81 mg by mouth daily.     Yes Historical Provider, MD  atorvastatin (LIPITOR) 20 MG tablet Take 1 tablet (20 mg total) by mouth daily at 6 PM. 05/09/13  Yes Renae Fickle, MD  budesonide-formoterol [facility])  160-4.5 MCG/ACT inhaler Inhale 2 puffs into the lungs 2 (two) times daily. 10/14/11  Yes Waymon Budge, MD  cholecalciferol (VITAMIN D) 1000 UNITS tablet Take 1,000 Units by mouth daily.   Yes Historical Provider, MD  clonazePAM (KLONOPIN) 0.5 MG tablet Take 0.5 mg by mouth 3 (three) times daily as needed for anxiety.  05/14/13  Yes Historical Provider, MD  furosemide (LASIX) 40 MG tablet TAKE ONE TABLET BY MOUTH EVERY MORNING 05/19/13  Yes Marykay Lex, MD  ipratropium-albuterol (DUONEB) 0.5-2.5  (3) MG/3ML SOLN Take 3 mLs by nebulization every 6 (six) hours as needed (wheezing and shortness of breath).    Yes Historical Provider, MD  potassium chloride SA (K-DUR,KLOR-CON) 20 MEQ tablet Take 20 mEq by mouth daily.   Yes Historical Provider, MD  sertraline (ZOLOFT) 25 MG tablet Take 1 tablet (25 mg total) by mouth daily. 05/09/13  Yes Renae Fickle, MD  warfarin (COUMADIN) 2.5 MG tablet Take 2.5-5 mg by mouth daily. Takes 1 tab (2.5mg ) daily except 2 tabs (5mg ) Mon/Thurs 02/19/13  Yes Phillips Hay, RPH-CPP  zolpidem (AMBIEN) 10 MG tablet Take 5 mg by mouth at bedtime.    Yes Historical Provider, MD  predniSONE (DELTASONE) 10 MG tablet 6 tabs daily x 2 days, 5 tabs daily x 2 days, 4 tabs daily x 2 days, 3 tabs daily x 2 days, 2 tabs daily x 2 days, 1 tab daily x 2 days 05/09/13   Renae Fickle, MD    Allergies:   Allergies  Allergen Reactions  . Prednisone     Makes pt jittery and nervous    Social History:  reports that he quit smoking about 15 years ago. His smoking use included Cigarettes. He has a 110 pack-year smoking history. He has never used smokeless tobacco. He reports that he does not drink alcohol or use illicit drugs.  Family History: Family History  Problem Relation Age of Onset  . Heart attack Mother   . Heart attack Father     Physical Exam: Filed Vitals:   05/20/13 1448 05/20/13 1456 05/20/13 1612  BP:  154/130   Pulse:  98   Temp:  98 F (36.7 C)   TempSrc:  Oral   Resp: 28 20   SpO2:  100% 100%    General:  Alert and oriented times three, well developed and nourished, chronic baseline SOB Eyes: PERRLA, pink conjunctiva, no scleral icterus ENT: Moist oral mucosa, neck supple, no thyromegaly Lungs: clear to ascultation, poor air exchange but no wheeze, no crackles, no use of accessory muscles Cardiovascular: regular rate and rhythm, no regurgitation, no gallops, no murmurs. No carotid bruits, no JVD Abdomen: soft, positive BS, non-tender,  non-distended, no organomegaly, not an acute abdomen GU: not examined Neuro: CN II - XII grossly intact, sensation intact Musculoskeletal: strength 5/5 all extremities, no clubbing, cyanosis or edema Skin: no rash, no subcutaneous crepitation, no decubitus Psych: appropriate patient   Labs on Admission:   Recent Labs  05/20/13 1503  NA 136  K 4.0  CL 98  CO2 25  GLUCOSE 106*  BUN 40*  CREATININE 1.24  CALCIUM 8.9   No results found for this basename: AST, ALT, ALKPHOS, BILITOT, PROT, ALBUMIN,  in the last 72 hours No results found for this basename: LIPASE, AMYLASE,  in the last 72 hours  Recent Labs  05/20/13 1503  WBC 11.1*  HGB 12.7*  HCT 37.9*  MCV 88.8  PLT 130*   No results found for this  basename: CKTOTAL, CKMB, CKMBINDEX, TROPONINI,  in the last 72 hours No components found with this basename: POCBNP,  No results found for this basename: DDIMER,  in the last 72 hours No results found for this basename: HGBA1C,  in the last 72 hours No results found for this basename: CHOL, HDL, LDLCALC, TRIG, CHOLHDL, LDLDIRECT,  in the last 72 hours No results found for this basename: TSH, T4TOTAL, FREET3, T3FREE, THYROIDAB,  in the last 72 hours No results found for this basename: VITAMINB12, FOLATE, FERRITIN, TIBC, IRON, RETICCTPCT,  in the last 72 hours  Micro Results: No results found for this or any previous visit (from the past 240 hour(s)).   Radiological Exams on Admission: Dg Chest 2 View (if Patient Has Fever And/or Copd)  05/20/2013   CLINICAL DATA:  Shortness of breath.  COPD.  Atrial fibrillation.  EXAM: CHEST  2 VIEW  COMPARISON:  05/07/2013  FINDINGS: The patient is rotated to the Right on today's radiograph, reducing diagnostic sensitivity and specificity. Prior CABG noted. Continued chronic a obscuration of the right hemidiaphragmatic margin. Bandlike airspace opacity medially at the right lung base. Prior right rib fractures and suspected right rib  osteotomy.  Thoracic spondylosis. No significant blunting of the posterior costophrenic angles.  IMPRESSION: 1. Increased atelectasis in the right lower lobe medially. 2. Chronic blunting of the right costophrenic angle with remote right rib fractures and osteotomies.   Electronically Signed   By: Herbie Baltimore M.D.   On: 05/20/2013 15:50    Assessment/Plan Present on Admission:   acute blood loss anemia Chronic anticoagulation -Admit patient, serial H&H's ordered -Type and cross, vitamin K and FFP ordered  -Unclear where bleeding is coming from will order a red blood cell scan tag study which are likely be done in the a.m., a stat CT of the abdomen and pelvis has been ordered. A GI consult . patient n.p.o. -Repeat PT/INR an hour after FFP infused -Patient will be admitted to step down, his hemoglobin has gone from 12.7 to 10 while in the ER. Will monitor in ICU until INR reversed and H&H stabilized. Chronic respiratory failure COPD Anxiety -Continue home meds, DuoNeb's when necessary, oxygen -Continue home medications Atrial fibrillation Anticoagulation has been reversed, monitor in telemetry   Code status: DNR DVT prophylaxis: not needed, patient on coumadin  Time in: 6:10PM Time out: 8:15 PM  Allaina Brotzman 05/20/2013, 6:41 PM

## 2013-05-20 NOTE — ED Notes (Signed)
Waiting for CT scan to be completed before transporting to floor

## 2013-05-20 NOTE — ED Notes (Signed)
Bed: WA02 Expected date:  Expected time:  Means of arrival:  Comments: EMS 

## 2013-05-21 ENCOUNTER — Inpatient Hospital Stay (HOSPITAL_COMMUNITY): Payer: Medicare Other

## 2013-05-21 DIAGNOSIS — I4891 Unspecified atrial fibrillation: Secondary | ICD-10-CM | POA: Diagnosis not present

## 2013-05-21 DIAGNOSIS — D649 Anemia, unspecified: Secondary | ICD-10-CM | POA: Diagnosis not present

## 2013-05-21 DIAGNOSIS — I10 Essential (primary) hypertension: Secondary | ICD-10-CM

## 2013-05-21 DIAGNOSIS — D62 Acute posthemorrhagic anemia: Secondary | ICD-10-CM | POA: Diagnosis not present

## 2013-05-21 DIAGNOSIS — J449 Chronic obstructive pulmonary disease, unspecified: Secondary | ICD-10-CM | POA: Diagnosis not present

## 2013-05-21 DIAGNOSIS — K625 Hemorrhage of anus and rectum: Secondary | ICD-10-CM | POA: Diagnosis not present

## 2013-05-21 LAB — BASIC METABOLIC PANEL
CO2: 29 mEq/L (ref 19–32)
Calcium: 8.4 mg/dL (ref 8.4–10.5)
Chloride: 101 mEq/L (ref 96–112)
Creatinine, Ser: 1.24 mg/dL (ref 0.50–1.35)
GFR calc Af Amer: 63 mL/min — ABNORMAL LOW (ref >90)
GFR calc non Af Amer: 54 mL/min — ABNORMAL LOW (ref >90)
Potassium: 4.8 mEq/L (ref 3.7–5.3)

## 2013-05-21 LAB — HEMOGLOBIN AND HEMATOCRIT, BLOOD
HCT: 36.2 % — ABNORMAL LOW (ref 39.0–52.0)
HCT: 35.4 % — ABNORMAL LOW (ref 39.0–52.0)
Hemoglobin: 11.7 g/dL — ABNORMAL LOW (ref 13.0–17.0)
Hemoglobin: 11.6 g/dL — ABNORMAL LOW (ref 13.0–17.0)

## 2013-05-21 LAB — PROTIME-INR
INR: 2.45 — ABNORMAL HIGH (ref 0.00–1.49)
INR: 1.26 (ref 0.00–1.49)
Prothrombin Time: 25.8 seconds — ABNORMAL HIGH (ref 11.6–15.2)
Prothrombin Time: 15.5 seconds — ABNORMAL HIGH (ref 11.6–15.2)

## 2013-05-21 LAB — CBC
Hemoglobin: 10.7 g/dL — ABNORMAL LOW (ref 13.0–17.0)
MCH: 30 pg (ref 26.0–34.0)
MCV: 88.8 fL (ref 78.0–100.0)
Platelets: 111 10*3/uL — ABNORMAL LOW (ref 150–400)
RDW: 14.7 % (ref 11.5–15.5)
WBC: 6.8 10*3/uL (ref 4.0–10.5)

## 2013-05-21 LAB — PRO B NATRIURETIC PEPTIDE: Pro B Natriuretic peptide (BNP): 1391 pg/mL — ABNORMAL HIGH (ref 0–450)

## 2013-05-21 MED ORDER — FUROSEMIDE 10 MG/ML IJ SOLN
20.0000 mg | Freq: Two times a day (BID) | INTRAMUSCULAR | Status: DC
  Administered 2013-05-21 – 2013-05-25 (×8): 20 mg via INTRAVENOUS
  Filled 2013-05-21 (×10): qty 2

## 2013-05-21 MED ORDER — TECHNETIUM TC 99M-LABELED RED BLOOD CELLS IV KIT
23.7000 | PACK | Freq: Once | INTRAVENOUS | Status: AC | PRN
  Administered 2013-05-21: 09:00:00 24 via INTRAVENOUS

## 2013-05-21 NOTE — Progress Notes (Signed)
Patient recently signed up for [facility] Care Management services. However, after [facility] Coordinator contacted patient at home, he declined further needs from [facility] Care Management. Came to beside to meet with patient again and to explain services again but he was off the floor. Will come back at later time.  Raiford Noble, MSN-Ed, RN,BSN- [facility] [phone]

## 2013-05-21 NOTE — Progress Notes (Signed)
INITIAL NUTRITION ASSESSMENT  DOCUMENTATION CODES Per approved criteria  -Not Applicable   INTERVENTION: Diet advancement per MD discretion Provide Carnation Instant Breakfast BID when diet advanced Provide Multivitamin with minerals daily  NUTRITION DIAGNOSIS: Increased nutrient needs related to COPD as evidenced by estimated needs.   Goal: Pt to meet >/= 90% of their estimated nutrition needs   Monitor:  PO intake Weight Labs  Reason for Assessment: Malnutrition Screening Tool, score of 3  79 y.o. male  Admitting Dx: Anemia  ASSESSMENT: 79 y/o gentleman with h/o chronic respiratory failure due to COPD as well as chronic anxiety. He presents to the ER with c/o shaking related to his anxiety.   Pt out of room at time of visit at nuclear medicine. RD familiar with pt from two previous admissions earlier this month at which time pt received healthful eating handouts and pt was encouraged to eat protein-rich foods daily. Pt has lost 10% of his body weight in less than 3 months with additional 3% weight loss in the past 2 weeks. During previous admission 2 weeks ago pt stated his appetite was much better; he was eating 100% of all meals during admission. Pt currently NPO.   Height: Ht Readings from Last 1 Encounters:  05/20/13 6' (1.829 m)    Weight: Wt Readings from Last 1 Encounters:  05/20/13 211 lb 3.2 oz (95.8 kg)    Ideal Body Weight: 178 lbs  % Ideal Body Weight: 119%  Wt Readings from Last 10 Encounters:  05/20/13 211 lb 3.2 oz (95.8 kg)  05/07/13 218 lb 0.6 oz (98.9 kg)  04/29/13 218 lb 12.8 oz (99.247 kg)  03/12/13 235 lb 3.2 oz (106.686 kg)  01/08/13 234 lb (106.142 kg)  09/06/12 241 lb (109.317 kg)  03/08/12 249 lb 6.4 oz (113.127 kg)  09/01/11 248 lb 12.8 oz (112.855 kg)  01/06/11 252 lb 12.8 oz (114.669 kg)  12/06/10 254 lb 9.6 oz (115.486 kg)    Usual Body Weight: 250 lbs  % Usual Body Weight: 84%  BMI:  Body mass index is 28.64  kg/(m^2).  Estimated Nutritional Needs: Kcal: 2200-2400 Protein: 120-130 grams Fluid: 2.2-2.4 L/day  Skin: intact  Diet Order: NPO  EDUCATION NEEDS: -No education needs identified at this time   Intake/Output Summary (Last 24 hours) at 05/21/13 1013 Last data filed at 05/21/13 0500  Gross per 24 hour  Intake    988 ml  Output      0 ml  Net    988 ml    Last BM: 12/29   Labs:   Recent Labs Lab 05/20/13 1503 05/21/13 0136  NA 136 139  K 4.0 4.8  CL 98 101  CO2 25 29  BUN 40* 41*  CREATININE 1.24 1.24  CALCIUM 8.9 8.4  GLUCOSE 106* 98    CBG (last 3)  No results found for this basename: GLUCAP,  in the last 72 hours  Scheduled Meds: . amLODipine  10 mg Oral Daily  . budesonide-formoterol  2 puff Inhalation BID  . docusate sodium  100 mg Oral BID  . pantoprazole (PROTONIX) IV  40 mg Intravenous Q12H  . potassium chloride SA  20 mEq Oral Daily  . sertraline  25 mg Oral Daily  . sodium chloride  3 mL Intravenous Q12H  . zolpidem  5 mg Oral QHS    Continuous Infusions:   Past Medical History  Diagnosis Date  . History of MI (myocardial infarction) March 2007    Cath showed  multivessel disease, referred for CABG  . CAD in native artery March 2007    Cath for dyspnea on exertion: 75% distal LM, 85% RI, 95% mid-distal Cx, in multiple RCA lesions with 95% distal. --> Referred for CABG  . S/P CABG x 24 July 2005    LIMA-LAD, SVG-OM, seq SVG-PDA -PLB  . Atrial fibrillation, chronic     Anticoagulated on warfarin. Stable -- post op  . H/O echocardiogram March 2007    Normal LV size and cavity appeared normal function. Grade 1 diastolic dysfunction. Limited study.  . Obesity (BMI 30-39.9)     One year ago weighed 265 pounds, (12/2012) -- now 234 pounds  . Dyslipidemia, goal LDL below 70   . COPD (chronic obstructive pulmonary disease) with emphysema 05/23/2007    Hosp 5/29-6/01/12- COPD exacerbation ONOX 12/08/10- desaturated to less than 88% for over an  hour, qualifying for home O2 during sleep    . Hypoxia      chronic, on home O2  . History of pneumonia   . Seasonal allergies     Past Surgical History  Procedure Laterality Date  . Cataract extraction      Laser  . Rll resection for hamartoma    . Rul for hamartoma    . Coronary artery bypass graft    . Cardiac catheterization  03 28 2007    NORMAL LV FUNCTION/ ABDOMINAL AORTA STENOSIS,75%-85%. RIGHT FEMORAL ARTERY :CATHETERS USED A  4-FRENCH WITH A 4-FRENCH SHEATH    Ian Malkin RD, LDN Inpatient Clinical Dietitian Pager: [phone] After Hours Pager: [phone]

## 2013-05-21 NOTE — Consult Note (Addendum)
Consultation  Referring Provider:  [facility] Primary Care Physician:  Juline Patch, MD Primary Gastroenterologist:  none    Reason for Consultation: anemia, heme positive stool              HPI:   Roberto Reilly is a 79 y.o. male with multiple medical problems including , but not limited to  CAD / CABG 2007, atrial fibrillation on chronic coumadin, and COPD. He has been hospitalized twice this month with COPD exacerbations. Patient admitted again yesterday with SOB. This time a drop in hgb was noted. His baseline hgb is 16, it was 12.7 yesterday and down to 10 today. MCV normal.  Patient heme positive in ED. INR supratherapeutic upon presentation (4.68). It is 2.45 after 3 units of FFP and vitamin K.  BUN elevated at 40 but it appears he has chronic renal insufficiency. As far as overt bleeding, he has intermittently sees blood when wiping but attributes that to constipation. Constipation is a relatively new problems for him. He uses Miralax as needed. Patient attributes constipation to stress and possibly medications. He has lost 20 pounds over the last several weeks. Most of the weight loss has been unintentional. No abdominal pain. No nausea / vomiting. No significant history of GERD. Patient has never had a screening colonoscopy. No history of PUD. He takes a daily baby aspirin, no other NSAIDS. No FMH of colon cancer.    Past Medical History  Diagnosis Date  . History of MI (myocardial infarction) March 2007    Cath showed multivessel disease, referred for CABG  . CAD in native artery March 2007    Cath for dyspnea on exertion: 75% distal LM, 85% RI, 95% mid-distal Cx, in multiple RCA lesions with 95% distal. --> Referred for CABG  . S/P CABG x 24 July 2005    LIMA-LAD, SVG-OM, seq SVG-PDA -PLB  . Atrial fibrillation, chronic     Anticoagulated on warfarin. Stable -- post op  . H/O echocardiogram March 2007    Normal LV size and cavity appeared normal function. Grade 1  diastolic dysfunction. Limited study.  . Obesity (BMI 30-39.9)     One year ago weighed 265 pounds, (12/2012) -- now 234 pounds  . Dyslipidemia, goal LDL below 70   . COPD (chronic obstructive pulmonary disease) with emphysema 05/23/2007    Hosp 5/29-6/01/12- COPD exacerbation ONOX 12/08/10- desaturated to less than 88% for over an hour, qualifying for home O2 during sleep    . Hypoxia      chronic, on home O2  . History of pneumonia   . Seasonal allergies     Past Surgical History  Procedure Laterality Date  . Cataract extraction      Laser  . Rll resection for hamartoma    . Rul for hamartoma    . Coronary artery bypass graft    . Cardiac catheterization  03 28 2007    NORMAL LV FUNCTION/ ABDOMINAL AORTA STENOSIS,75%-85%. RIGHT FEMORAL ARTERY :CATHETERS USED A  4-FRENCH WITH A 4-FRENCH SHEATH    Family History  Problem Relation Age of Onset  . Heart attack Mother   . Heart attack Father    No colon cancer  History  Substance Use Topics  . Smoking status: Former Smoker -- 2.00 packs/day for 55 years    Types: Cigarettes    Quit date: 05/23/1998  . Smokeless tobacco: Never Used  . Alcohol Use: No    Prior to Admission medications   Medication Sig  Start Date End Date Taking? Authorizing Provider  amLODipine (NORVASC) 10 MG tablet Take 1 tablet (10 mg total) by mouth daily. 05/01/13  Yes Dorothea Ogle, MD  aspirin 81 MG tablet Take 81 mg by mouth daily.     Yes Historical Provider, MD  atorvastatin (LIPITOR) 20 MG tablet Take 1 tablet (20 mg total) by mouth daily at 6 PM. 05/09/13  Yes Renae Fickle, MD  budesonide-formoterol [facility]) 160-4.5 MCG/ACT inhaler Inhale 2 puffs into the lungs 2 (two) times daily. 10/14/11  Yes Waymon Budge, MD  cholecalciferol (VITAMIN D) 1000 UNITS tablet Take 1,000 Units by mouth daily.   Yes Historical Provider, MD  clonazePAM (KLONOPIN) 0.5 MG tablet Take 0.5 mg by mouth 3 (three) times daily as needed for anxiety.  05/14/13  Yes  Historical Provider, MD  furosemide (LASIX) 40 MG tablet TAKE ONE TABLET BY MOUTH EVERY MORNING 05/19/13  Yes Marykay Lex, MD  ipratropium-albuterol (DUONEB) 0.5-2.5 (3) MG/3ML SOLN Take 3 mLs by nebulization every 6 (six) hours as needed (wheezing and shortness of breath).    Yes Historical Provider, MD  potassium chloride SA (K-DUR,KLOR-CON) 20 MEQ tablet Take 20 mEq by mouth daily.   Yes Historical Provider, MD  sertraline (ZOLOFT) 25 MG tablet Take 1 tablet (25 mg total) by mouth daily. 05/09/13  Yes Renae Fickle, MD  warfarin (COUMADIN) 2.5 MG tablet Take 2.5-5 mg by mouth daily. Takes 1 tab (2.5mg ) daily except 2 tabs (5mg ) Mon/Thurs 02/19/13  Yes Phillips Hay, RPH-CPP  zolpidem (AMBIEN) 10 MG tablet Take 5 mg by mouth at bedtime.    Yes Historical Provider, MD  predniSONE (DELTASONE) 10 MG tablet 6 tabs daily x 2 days, 5 tabs daily x 2 days, 4 tabs daily x 2 days, 3 tabs daily x 2 days, 2 tabs daily x 2 days, 1 tab daily x 2 days 05/09/13   Renae Fickle, MD    Current Facility-Administered Medications  Medication Dose Route Frequency Provider Last Rate Last Dose  . amLODipine (NORVASC) tablet 10 mg  10 mg Oral Daily Debby Crosley, MD      . budesonide-formoterol (SYMBICORT) 160-4.5 MCG/ACT inhaler 2 puff  2 puff Inhalation BID Gery Pray, MD   2 puff at 05/20/13 2240  . clonazePAM (KLONOPIN) tablet 0.5 mg  0.5 mg Oral TID PRN Gery Pray, MD      . docusate sodium (COLACE) capsule 100 mg  100 mg Oral BID Debby Crosley, MD   100 mg at 05/20/13 2307  . ipratropium-albuterol (DUONEB) 0.5-2.5 (3) MG/3ML nebulizer solution 3 mL  3 mL Nebulization Q4H PRN Debby Crosley, MD      . ondansetron (ZOFRAN) tablet 4 mg  4 mg Oral Q6H PRN Debby Crosley, MD       Or  . ondansetron (ZOFRAN) injection 4 mg  4 mg Intravenous Q6H PRN Debby Crosley, MD      . pantoprazole (PROTONIX) injection 40 mg  40 mg Intravenous Q12H Debby Crosley, MD   40 mg at 05/20/13 2308  . potassium chloride SA  (K-DUR,KLOR-CON) CR tablet 20 mEq  20 mEq Oral Daily Debby Crosley, MD      . sertraline (ZOLOFT) tablet 25 mg  25 mg Oral Daily Debby Crosley, MD      . sodium chloride 0.9 % injection 3 mL  3 mL Intravenous Q12H Debby Crosley, MD   3 mL at 05/20/13 2322  . zolpidem (AMBIEN) tablet 5 mg  5 mg Oral QHS Gery Pray, MD  5 mg at 05/20/13 2307    Allergies as of 05/20/2013 - Review Complete 05/20/2013  Allergen Reaction Noted  . Prednisone  05/20/2013   Review of Systems:    All systems reviewed and negative except where noted in HPI.   Physical Exam:  Vital signs in last 24 hours: Temp:  [97.4 F (36.3 C)-98.6 F (37 C)] 98.6 F (37 C) (12/30 0500) Pulse Rate:  [60-98] 60 (12/30 0500) Resp:  [20-28] 24 (12/30 0500) BP: (94-154)/(37-130) 101/41 mmHg (12/30 0500) SpO2:  [96 %-100 %] 100 % (12/30 0500) Weight:  [211 lb 3.2 oz (95.8 kg)] 211 lb 3.2 oz (95.8 kg) (12/29 2130) Last BM Date: 05/20/13 General:   Pleasant white male in NAD Head:  Normocephalic and atraumatic. Eyes:   No icterus.   Conjunctiva pink. Ears:  Normal auditory acuity. Neck:  Supple; no masses felt Lungs:  Respirations even and unlabored. Lungs clear to auscultation bilaterally.   No wheezes, crackles, or rhonchi.  Heart:  Regular rate. Occasional irregular beat. Abdomen:  Soft, obese, nondistended, nontender. Normal bowel sounds. No appreciable masses or hepatomegaly.  Rectal:  No external lesions. Small pieces of hard stool in vault  Extremities:  Without edema. Neurologic:  Alert and  oriented x4;  grossly normal neurologically. Skin:  Intact without significant lesions or rashes. Cervical Nodes:  No significant cervical adenopathy. Psych:  Alert and cooperative. Normal affect.  LAB RESULTS:  Recent Labs  05/20/13 1503 05/20/13 1830 05/20/13 2210 05/21/13 0136  WBC 11.1* 8.3  --  6.8  HGB 12.7* 10.0* 11.8* 10.7*  HCT 37.9* 29.5* 35.2* 31.7*  PLT 130* 111*  --  111*   BMET  Recent Labs   05/20/13 1503 05/21/13 0136  NA 136 139  K 4.0 4.8  CL 98 101  CO2 25 29  GLUCOSE 106* 98  BUN 40* 41*  CREATININE 1.24 1.24  CALCIUM 8.9 8.4   PT/INR  Recent Labs  05/20/13 1503 05/21/13 0136  LABPROT 42.2* 25.8*  INR 4.68* 2.45*    STUDIES: Dg Chest 2 View (if Patient Has Fever And/or Copd)  05/20/2013   CLINICAL DATA:  Shortness of breath.  COPD.  Atrial fibrillation.  EXAM: CHEST  2 VIEW  COMPARISON:  05/07/2013  FINDINGS: The patient is rotated to the Right on today's radiograph, reducing diagnostic sensitivity and specificity. Prior CABG noted. Continued chronic a obscuration of the right hemidiaphragmatic margin. Bandlike airspace opacity medially at the right lung base. Prior right rib fractures and suspected right rib osteotomy.  Thoracic spondylosis. No significant blunting of the posterior costophrenic angles.  IMPRESSION: 1. Increased atelectasis in the right lower lobe medially. 2. Chronic blunting of the right costophrenic angle with remote right rib fractures and osteotomies.   Electronically Signed   By: Herbie Baltimore M.D.   On: 05/20/2013 15:50   Ct Abdomen Pelvis W Contrast  05/20/2013   CLINICAL DATA:  Internal bleeding. Patient on blood thinners. Anemia due to acute blood loss.  EXAM: CT ABDOMEN AND PELVIS WITH CONTRAST  TECHNIQUE: Multidetector CT imaging of the abdomen and pelvis was performed using the standard protocol following bolus administration of intravenous contrast.  CONTRAST:  50mL OMNIPAQUE IOHEXOL 300 MG/ML SOLN, OMNIPAQUE IOHEXOL 300 MG/ML SOLN  COMPARISON:  None.  FINDINGS: Mild pleural thickening and scarring in the right base. Coronary artery calcifications. Postoperative change in the mediastinum.  Cholelithiasis with small stones layering in the gallbladder. No gallbladder wall thickening or inflammation. The liver, spleen, pancreas, adrenal  glands, kidneys, inferior vena cava, and retroperitoneal lymph nodes are unremarkable. The the  abdominal aorta is tortuous with diffuse atherosclerotic calcification and focal aneurysm measuring 4.2 x 4.8 cm diameter. No occlusion or dissection is suggested. The stomach, small bowel, and colon are not abnormally distended. Prominent visceral adipose tissues. No free air or free fluid in the abdomen. No evidence of mesenteric or retroperitoneal hematoma.  Pelvis: Prostate gland is enlarged. Bladder wall is not thickened. No free or loculated pelvic fluid collections. Diverticula in the sigmoid colon without evidence of diverticulitis. The appendix is not visualized. No significant lymphadenopathy in the pelvis. Soft tissues and body wall are unremarkable. Degenerative changes in the lumbar spine. No destructive bone lesions.  IMPRESSION: No evidence of intra-abdominal or pelvic hematoma. There is an abdominal aortic aneurysm measuring 4.2 x 4.8 cm. No evidence of rupture or dissection. Cholelithiasis.   Electronically Signed   By: Burman Nieves M.D.   On: 05/20/2013 21:15   PREVIOUS ENDOSCOPIES:            none   Impression / Plan:    22. 79 year old male with new anemia.  Baseline hgb around 16, at 11.7 today. Patient reports intermittent ,self-limited rectal bleeding with hard stools. Constipation is relative new. Interestingly, he has had a 4gm drop in hgb in the last 2 weeks, it is hard to believe that this is just from the small amount of rectal bleeding described.  Ideally patient needs colonoscopy for further evaluation of anemia, bleeding and weight loss but he is at increased risk for procedures given COPD and other co-morbidities. Patient desaturated a couple of times during my interview. Prior to endoscopic patient will need further reversal of INR.     2. Diverticular disease by CTscan  3. cholelithiasis, asymptomatic  4 Thrombocytopenia, chronic but slightly worse this admission. Spleen normal on CTscan.   5. AAA 4.2 x 4.8 cm on CTscan  6. Atrial fibrillation, coumadin on hold.    7. Multiple, significant co-morbidities.   Thanks   LOS: 1 day   Willette Cluster  05/21/2013, 9:35 AM     GI Attending  I have also seen and assessed the patient ealier and agree with the above note. Rectal bleeding and anemia - new - in setting of severe COPD, CHF (pBNP very high suggests)  and supratherapeutic INR Will reassess in AM. He has poor lung function and sedation could be an issue but suspect we can pull off endoscopic evaluation. May need some tuning up first.  Will check ferritin and B12.  Iva Boop, MD, [facility] [phone] (pager) 05/21/2013 8:26 PM

## 2013-05-21 NOTE — Progress Notes (Signed)
[facility] PROGRESS NOTE  Roberto Reilly WUJ:[id number] DOB: [birth year] DOA: 05/20/2013 PCP: Juline Patch, MD  Assessment/Plan: Principal Problem:   Anemia: Patient at some point is have some sort of bleeding. He has increased BUN and creatinine and hemoglobin drop of 4 g. However, since admission, his hemoglobin has certainly stabilized. Discussed with gastroenterology. Concerned given his lung function status about whether he would tolerate endoscopy/colonoscopy and sedation. He is at high risk for bleeding again and had a big drop. They're deciding about scope at this time Active Problems:    S/P CABG x 4: Concerned about elevated INR which may be from decreased creatinine clearance from secondary hepatic congestion from diastolic heart failure. Checking BNP. Echo last done a number of years ago.   Atrial fibrillation, chronic / persistent: Rate control. Need to make some decisions about anticoagulation. See above   HTN (hypertension): Stable   COPD (chronic obstructive pulmonary disease): Currently stable. Continue meds.   Generalized anxiety disorder: Stable continue anxiolytics   Acute blood loss anemia: See above  Code Status: DO NOT RESUSCITATE  Family Communication: No family present  Disposition Plan: Depending on decision for colonoscopy   Consultants:  [facility]  Procedures:  Possible scope  Antibiotics:  None  HPI/Subjective: Patient doing okay. No complaints. No shortness of breath currently. No chest pain.  Objective: Filed Vitals:   05/21/13 1239  BP: 117/90  Pulse: 76  Temp:   Resp:     Intake/Output Summary (Last 24 hours) at 05/21/13 1809 Last data filed at 05/21/13 1616  Gross per 24 hour  Intake   1228 ml  Output    575 ml  Net    653 ml   Filed Weights   05/20/13 2130  Weight: 95.8 kg (211 lb 3.2 oz)    Exam:   General:  Alert and oriented x3, no acute distress  Cardiovascular: Irregular rhythm, rate  controlled, 2/6 systolic ejection murmur  Respiratory: Decreased breath sounds throughout  Abdomen: Soft, obese, nontender, positive bowel sounds  Musculoskeletal: No clubbing or cyanosis, trace edema   Data Reviewed: Basic Metabolic Panel:  Recent Labs Lab 05/20/13 1503 05/21/13 0136  NA 136 139  K 4.0 4.8  CL 98 101  CO2 25 29  GLUCOSE 106* 98  BUN 40* 41*  CREATININE 1.24 1.24  CALCIUM 8.9 8.4   Liver Function Tests: No results found for this basename: AST, ALT, ALKPHOS, BILITOT, PROT, ALBUMIN,  in the last 168 hours No results found for this basename: LIPASE, AMYLASE,  in the last 168 hours No results found for this basename: AMMONIA,  in the last 168 hours CBC:  Recent Labs Lab 05/20/13 1503 05/20/13 1830 05/20/13 2210 05/21/13 0136 05/21/13 1239 05/21/13 1722  WBC 11.1* 8.3  --  6.8  --   --   NEUTROABS  --  6.2  --   --   --   --   HGB 12.7* 10.0* 11.8* 10.7* 11.7* 11.6*  HCT 37.9* 29.5* 35.2* 31.7* 36.2* 35.4*  MCV 88.8 89.1  --  88.8  --   --   PLT 130* 111*  --  111*  --   --    Cardiac Enzymes: No results found for this basename: CKTOTAL, CKMB, CKMBINDEX, TROPONINI,  in the last 168 hours BNP (last 3 results)  Recent Labs  04/29/13 1716 05/07/13 1200 05/20/13 1517  PROBNP 445.2 760.7* 425.6   CBG: No results found for this basename: GLUCAP,  in the last 168 hours  No  results found for this or any previous visit (from the past 240 hour(s)).   Studies: Dg Chest 2 View (if Patient Has Fever And/or Copd)  05/20/2013   CLINICAL DATA:  Shortness of breath.  COPD.  Atrial fibrillation.  EXAM: CHEST  2 VIEW  COMPARISON:  05/07/2013  FINDINGS: The patient is rotated to the Right on today's radiograph, reducing diagnostic sensitivity and specificity. Prior CABG noted. Continued chronic a obscuration of the right hemidiaphragmatic margin. Bandlike airspace opacity medially at the right lung base. Prior right rib fractures and suspected right rib  osteotomy.  Thoracic spondylosis. No significant blunting of the posterior costophrenic angles.  IMPRESSION: 1. Increased atelectasis in the right lower lobe medially. 2. Chronic blunting of the right costophrenic angle with remote right rib fractures and osteotomies.   Electronically Signed   By: Herbie Baltimore M.D.   On: 05/20/2013 15:50   Nm Gi Blood Loss  05/21/2013   CLINICAL DATA:  Anemia.  EXAM: NUCLEAR MEDICINE GASTROINTESTINAL BLEEDING SCAN  TECHNIQUE: Sequential abdominal images were obtained following intravenous administration of Tc-60m labeled red blood cells.  COMPARISON:  No comparison nuclear medicine exam. Comparison CT 05/20/2013.  RADIOPHARMACEUTICALS:  23.7 MILLI CURIE ULTRATAG TECHNETIUM TC 77M-LABELED RED BLOOD CELLS IV KITmCi Tc-4m in-vitro labeled red cells.  FINDINGS: No evidence of gastrointestinal hemorrhage identified. Exam was performed over 2 hours.  Prominent radiotracer uptake at the level of the penis. This was confirmed as related to the penis rather than the rectum on the lateral view.  Abdominal aortic aneurysm.  This is better assessed on recent CT.  IMPRESSION: No evidence of gastrointestinal bleed identified.  Please see above.   Electronically Signed   By: Bridgett Larsson M.D.   On: 05/21/2013 12:38   Ct Abdomen Pelvis W Contrast  05/20/2013   CLINICAL DATA:  Internal bleeding. Patient on blood thinners. Anemia due to acute blood loss.  EXAM: CT ABDOMEN AND PELVIS WITH CONTRAST  TECHNIQUE: Multidetector CT imaging of the abdomen and pelvis was performed using the standard protocol following bolus administration of intravenous contrast.  CONTRAST:  50mL OMNIPAQUE IOHEXOL 300 MG/ML SOLN, OMNIPAQUE IOHEXOL 300 MG/ML SOLN  COMPARISON:  None.  FINDINGS: Mild pleural thickening and scarring in the right base. Coronary artery calcifications. Postoperative change in the mediastinum.  Cholelithiasis with small stones layering in the gallbladder. No gallbladder wall  thickening or inflammation. The liver, spleen, pancreas, adrenal glands, kidneys, inferior vena cava, and retroperitoneal lymph nodes are unremarkable. The the abdominal aorta is tortuous with diffuse atherosclerotic calcification and focal aneurysm measuring 4.2 x 4.8 cm diameter. No occlusion or dissection is suggested. The stomach, small bowel, and colon are not abnormally distended. Prominent visceral adipose tissues. No free air or free fluid in the abdomen. No evidence of mesenteric or retroperitoneal hematoma.  Pelvis: Prostate gland is enlarged. Bladder wall is not thickened. No free or loculated pelvic fluid collections. Diverticula in the sigmoid colon without evidence of diverticulitis. The appendix is not visualized. No significant lymphadenopathy in the pelvis. Soft tissues and body wall are unremarkable. Degenerative changes in the lumbar spine. No destructive bone lesions.  IMPRESSION: No evidence of intra-abdominal or pelvic hematoma. There is an abdominal aortic aneurysm measuring 4.2 x 4.8 cm. No evidence of rupture or dissection. Cholelithiasis.   Electronically Signed   By: Burman Nieves M.D.   On: 05/20/2013 21:15    Scheduled Meds: . amLODipine  10 mg Oral Daily  . budesonide-formoterol  2 puff Inhalation BID  .  docusate sodium  100 mg Oral BID  . pantoprazole (PROTONIX) IV  40 mg Intravenous Q12H  . potassium chloride SA  20 mEq Oral Daily  . sertraline  25 mg Oral Daily  . sodium chloride  3 mL Intravenous Q12H  . zolpidem  5 mg Oral QHS   Continuous Infusions:   Principal Problem:   Anemia Active Problems:   Obesity (BMI 30-39.9)   S/P CABG x 4   Atrial fibrillation, chronic / persistent   HTN (hypertension)   COPD (chronic obstructive pulmonary disease)   Generalized anxiety disorder   Acute blood loss anemia    Time spent: 35 minutes    Hollice Espy  [facility] Pager [phone]. If 7PM-7AM, please contact night-coverage at [email or web address],  password [facility] 05/21/2013, 6:09 PM  LOS: 1 day

## 2013-05-22 DIAGNOSIS — I714 Abdominal aortic aneurysm, without rupture: Secondary | ICD-10-CM | POA: Diagnosis present

## 2013-05-22 DIAGNOSIS — D62 Acute posthemorrhagic anemia: Secondary | ICD-10-CM | POA: Diagnosis not present

## 2013-05-22 DIAGNOSIS — I4891 Unspecified atrial fibrillation: Secondary | ICD-10-CM | POA: Diagnosis not present

## 2013-05-22 DIAGNOSIS — D649 Anemia, unspecified: Secondary | ICD-10-CM | POA: Diagnosis not present

## 2013-05-22 DIAGNOSIS — J449 Chronic obstructive pulmonary disease, unspecified: Secondary | ICD-10-CM | POA: Diagnosis not present

## 2013-05-22 DIAGNOSIS — K922 Gastrointestinal hemorrhage, unspecified: Secondary | ICD-10-CM

## 2013-05-22 LAB — FERRITIN: Ferritin: 257 ng/mL (ref 22–322)

## 2013-05-22 LAB — CBC
HCT: 32.7 % — ABNORMAL LOW (ref 39.0–52.0)
MCHC: 33 g/dL (ref 30.0–36.0)
MCV: 89.8 fL (ref 78.0–100.0)
RDW: 14.6 % (ref 11.5–15.5)
WBC: 6.4 10*3/uL (ref 4.0–10.5)

## 2013-05-22 LAB — IRON AND TIBC
Saturation Ratios: 13 % — ABNORMAL LOW (ref 20–55)
UIBC: 219 ug/dL (ref 125–400)

## 2013-05-22 LAB — PREPARE FRESH FROZEN PLASMA
Unit division: 0
Unit division: 0
Unit division: 0

## 2013-05-22 LAB — BASIC METABOLIC PANEL
BUN: 29 mg/dL — ABNORMAL HIGH (ref 6–23)
CO2: 28 mEq/L (ref 19–32)
Chloride: 98 mEq/L (ref 96–112)
Creatinine, Ser: 1.16 mg/dL (ref 0.50–1.35)
GFR calc Af Amer: 68 mL/min — ABNORMAL LOW (ref >90)
GFR calc non Af Amer: 59 mL/min — ABNORMAL LOW (ref >90)
Glucose, Bld: 100 mg/dL — ABNORMAL HIGH (ref 70–99)
Potassium: 4 mEq/L (ref 3.7–5.3)

## 2013-05-22 LAB — VITAMIN B12: Vitamin B-12: 199 pg/mL — ABNORMAL LOW (ref 211–911)

## 2013-05-22 MED ORDER — BISACODYL 5 MG PO TBEC
10.0000 mg | DELAYED_RELEASE_TABLET | Freq: Once | ORAL | Status: AC
  Administered 2013-05-22: 17:00:00 10 mg via ORAL
  Filled 2013-05-22: qty 2

## 2013-05-22 MED ORDER — POLYETHYLENE GLYCOL 3350 17 G PO PACK
17.0000 g | PACK | Freq: Two times a day (BID) | ORAL | Status: DC
  Administered 2013-05-22 – 2013-05-23 (×3): 17 g via ORAL
  Filled 2013-05-22 (×8): qty 1

## 2013-05-22 NOTE — Progress Notes (Signed)
[facility] PROGRESS NOTE  Roberto Reilly ZOX:[id number] DOB: December 11, 1933 DOA: 05/20/2013 PCP: Juline Patch, MD  Assessment/Plan: Principal Problem:   Anemia: Patient has clearly had an acute bleed recently. The cause of it was likely be supratherapeutic INR. Patient had been on Coumadin for almost 10 years. I suspect that the acute problem she is having more from acute diastolic CHF leading to decreased Coumadin clinic because of secondary hepatic congestion. Her treating CHF, his INR levels were both drop and by keeping his diastolic heart failure and control, this will greatly help his Coumadin management. Hemoglobin remained stable. Gastroenterology plans for scope on Friday 1/2. Active Problems: Abdominal aortic aneurysm:? New diagnosis. 4.5 cm, needs to be followed outpatient by vascular surgery. Likely would need medical management only    S/P CABG x 4: Concerned about elevated INR which may be from decreased creatinine clearance from secondary hepatic congestion from diastolic heart failure.   Diastolic heart failure: Suspected. BNP elevated. Started Lasix. Awaiting echocardiogram.    Atrial fibrillation, chronic / persistent: Rate control. Need to make some decisions about anticoagulation. See above   HTN (hypertension): Stable   COPD (chronic obstructive pulmonary disease): Currently stable. Continue meds.   Generalized anxiety disorder: Stable continue anxiolytics   Acute blood loss anemia: See above  Code Status: DO NOT RESUSCITATE  Family Communication: No family present  Disposition Plan: Depending on decision for colonoscopy   Consultants:  [place] [facility]  Procedures:  Possible scope  Antibiotics:  None  HPI/Subjective: Patient doing okay. No complaints. No shortness of breath currently. No chest pain. Had many questions once we discussed? Heart failure issues.  Objective: Filed Vitals:   05/22/13 1437  BP: 136/67  Pulse: 68  Temp: 97.1 F  (36.2 C)  Resp: 20    Intake/Output Summary (Last 24 hours) at 05/22/13 1853 Last data filed at 05/22/13 1846  Gross per 24 hour  Intake    840 ml  Output   2220 ml  Net  -1380 ml   Filed Weights   05/20/13 2130 05/22/13 0519  Weight: 95.8 kg (211 lb 3.2 oz) 95.618 kg (210 lb 12.8 oz)    Exam:   General:  Alert and oriented x3, no acute distress  Cardiovascular: Irregular rhythm, rate controlled, 2/6 systolic ejection murmur  Respiratory: Decreased breath sounds throughout  Abdomen: Soft, obese, nontender, positive bowel sounds  Musculoskeletal: No clubbing or cyanosis, trace edema   Data Reviewed: Basic Metabolic Panel:  Recent Labs Lab 05/20/13 1503 05/21/13 0136 05/22/13 0449  NA 136 139 136*  K 4.0 4.8 4.0  CL 98 101 98  CO2 25 29 28   GLUCOSE 106* 98 100*  BUN 40* 41* 29*  CREATININE 1.24 1.24 1.16  CALCIUM 8.9 8.4 8.3*   Liver Function Tests: No results found for this basename: AST, ALT, ALKPHOS, BILITOT, PROT, ALBUMIN,  in the last 168 hours No results found for this basename: LIPASE, AMYLASE,  in the last 168 hours No results found for this basename: AMMONIA,  in the last 168 hours CBC:  Recent Labs Lab 05/20/13 1503 05/20/13 1830 05/20/13 2210 05/21/13 0136 05/21/13 1239 05/21/13 1722 05/22/13 0449  WBC 11.1* 8.3  --  6.8  --   --  6.4  NEUTROABS  --  6.2  --   --   --   --   --   HGB 12.7* 10.0* 11.8* 10.7* 11.7* 11.6* 10.8*  HCT 37.9* 29.5* 35.2* 31.7* 36.2* 35.4* 32.7*  MCV 88.8 89.1  --  88.8  --   --  89.8  PLT 130* 111*  --  111*  --   --  105*   Cardiac Enzymes: No results found for this basename: CKTOTAL, CKMB, CKMBINDEX, TROPONINI,  in the last 168 hours BNP (last 3 results)  Recent Labs  05/07/13 1200 05/20/13 1517 05/21/13 1918  PROBNP 760.7* 425.6 1391.0*   CBG: No results found for this basename: GLUCAP,  in the last 168 hours  No results found for this or any previous visit (from the past 240 hour(s)).    Studies: Nm Gi Blood Loss  05/21/2013   CLINICAL DATA:  Anemia.  EXAM: NUCLEAR MEDICINE GASTROINTESTINAL BLEEDING SCAN  TECHNIQUE: Sequential abdominal images were obtained following intravenous administration of Tc-49m labeled red blood cells.  COMPARISON:  No comparison nuclear medicine exam. Comparison CT 05/20/2013.  RADIOPHARMACEUTICALS:  23.7 MILLI CURIE ULTRATAG TECHNETIUM TC 57M-LABELED RED BLOOD CELLS IV KITmCi Tc-56m in-vitro labeled red cells.  FINDINGS: No evidence of gastrointestinal hemorrhage identified. Exam was performed over 2 hours.  Prominent radiotracer uptake at the level of the penis. This was confirmed as related to the penis rather than the rectum on the lateral view.  Abdominal aortic aneurysm.  This is better assessed on recent CT.  IMPRESSION: No evidence of gastrointestinal bleed identified.  Please see above.   Electronically Signed   By: Bridgett Larsson M.D.   On: 05/21/2013 12:38   Ct Abdomen Pelvis W Contrast  05/20/2013   CLINICAL DATA:  Internal bleeding. Patient on blood thinners. Anemia due to acute blood loss.  EXAM: CT ABDOMEN AND PELVIS WITH CONTRAST  TECHNIQUE: Multidetector CT imaging of the abdomen and pelvis was performed using the standard protocol following bolus administration of intravenous contrast.  CONTRAST:  50mL OMNIPAQUE IOHEXOL 300 MG/ML SOLN, OMNIPAQUE IOHEXOL 300 MG/ML SOLN  COMPARISON:  None.  FINDINGS: Mild pleural thickening and scarring in the right base. Coronary artery calcifications. Postoperative change in the mediastinum.  Cholelithiasis with small stones layering in the gallbladder. No gallbladder wall thickening or inflammation. The liver, spleen, pancreas, adrenal glands, kidneys, inferior vena cava, and retroperitoneal lymph nodes are unremarkable. The the abdominal aorta is tortuous with diffuse atherosclerotic calcification and focal aneurysm measuring 4.2 x 4.8 cm diameter. No occlusion or dissection is suggested. The stomach,  small bowel, and colon are not abnormally distended. Prominent visceral adipose tissues. No free air or free fluid in the abdomen. No evidence of mesenteric or retroperitoneal hematoma.  Pelvis: Prostate gland is enlarged. Bladder wall is not thickened. No free or loculated pelvic fluid collections. Diverticula in the sigmoid colon without evidence of diverticulitis. The appendix is not visualized. No significant lymphadenopathy in the pelvis. Soft tissues and body wall are unremarkable. Degenerative changes in the lumbar spine. No destructive bone lesions.  IMPRESSION: No evidence of intra-abdominal or pelvic hematoma. There is an abdominal aortic aneurysm measuring 4.2 x 4.8 cm. No evidence of rupture or dissection. Cholelithiasis.   Electronically Signed   By: Burman Nieves M.D.   On: 05/20/2013 21:15    Scheduled Meds: . amLODipine  10 mg Oral Daily  . budesonide-formoterol  2 puff Inhalation BID  . docusate sodium  100 mg Oral BID  . furosemide  20 mg Intravenous BID  . pantoprazole (PROTONIX) IV  40 mg Intravenous Q12H  . polyethylene glycol  17 g Oral BID  . potassium chloride SA  20 mEq Oral Daily  . sertraline  25 mg Oral Daily  . sodium chloride  3 mL Intravenous Q12H  . zolpidem  5 mg Oral QHS   Continuous Infusions:   Principal Problem:   Anemia Active Problems:   Obesity (BMI 30-39.9)   S/P CABG x 4   Atrial fibrillation, chronic / persistent   HTN (hypertension)   COPD (chronic obstructive pulmonary disease)   Generalized anxiety disorder   Acute blood loss anemia    Time spent: 25 minutes    Hollice Espy  [facility] Pager [phone]. If 7PM-7AM, please contact night-coverage at [email or web address], password [facility] 05/22/2013, 6:53 PM  LOS: 2 days

## 2013-05-22 NOTE — Progress Notes (Signed)
Met with Mr Krempasky at bedside to explain [facility] Care Management services and that attempt was made to contact him post last discharge and he told [facility] Coordinator that he did not need services. States he gets somewhat overwhelmed with all of the agencies that want his business. Explained to him that [facility] Care Management is not of any cost to him. States he will be agreeable for further follow up by [facility] Care Management services post hospital discharge. [facility] Care Management will attempt to reach patient again post hospital discharge and he will be evaluated for monthly home visits. Will make inpatient RNCM aware of discussion with patient.  Raiford Noble, MSN,RN, BSN- [facility] [phone]

## 2013-05-22 NOTE — Progress Notes (Signed)
Progress Note   Subjective  no rectal bleeding. He endorses constipation. No SOB. He feels depressed   Objective   Vital signs in last 24 hours: Temp:  [98.4 F (36.9 C)-98.7 F (37.1 C)] 98.4 F (36.9 C) (12/31 0519) Pulse Rate:  [64-76] 66 (12/31 0519) Resp:  [20-24] 20 (12/31 0519) BP: (112-134)/(49-90) 112/49 mmHg (12/31 0519) SpO2:  [98 %-100 %] 100 % (12/31 0917) Weight:  [210 lb 12.8 oz (95.618 kg)] 210 lb 12.8 oz (95.618 kg) (12/31 0519) Last BM Date: 05/20/13 General:    Pleasant white male in NAD Lungs: Respirations even and unlabored, lungs CTA bilaterally Abdomen:  Soft, nontender and nondistended. Normal bowel sounds. .Neurologic:  Alert and oriented,  grossly normal neurologically. Psych:  Cooperative. Flat affect.   Lab Results:  Recent Labs  05/20/13 1830  05/21/13 0136 05/21/13 1239 05/21/13 1722 05/22/13 0449  WBC 8.3  --  6.8  --   --  6.4  HGB 10.0*  < > 10.7* 11.7* 11.6* 10.8*  HCT 29.5*  < > 31.7* 36.2* 35.4* 32.7*  PLT 111*  --  111*  --   --  105*  < > = values in this interval not displayed. BMET  Recent Labs  05/20/13 1503 05/21/13 0136 05/22/13 0449  NA 136 139 136*  K 4.0 4.8 4.0  CL 98 101 98  CO2 25 29 28   GLUCOSE 106* 98 100*  BUN 40* 41* 29*  CREATININE 1.24 1.24 1.16  CALCIUM 8.9 8.4 8.3*   PT/INR  Recent Labs  05/21/13 0136 05/21/13 1722  LABPROT 25.8* 15.5*  INR 2.45* 1.26    Studies/Results: Dg Chest 2 View (if Patient Has Fever And/or Copd)  05/20/2013   CLINICAL DATA:  Shortness of breath.  COPD.  Atrial fibrillation.  EXAM: CHEST  2 VIEW  COMPARISON:  05/07/2013  FINDINGS: The patient is rotated to the Right on today's radiograph, reducing diagnostic sensitivity and specificity. Prior CABG noted. Continued chronic a obscuration of the right hemidiaphragmatic margin. Bandlike airspace opacity medially at the right lung base. Prior right rib fractures and suspected right rib osteotomy.  Thoracic spondylosis.  No significant blunting of the posterior costophrenic angles.  IMPRESSION: 1. Increased atelectasis in the right lower lobe medially. 2. Chronic blunting of the right costophrenic angle with remote right rib fractures and osteotomies.   Electronically Signed   By: Herbie Baltimore M.D.   On: 05/20/2013 15:50   Nm Gi Blood Loss  05/21/2013   CLINICAL DATA:  Anemia.  EXAM: NUCLEAR MEDICINE GASTROINTESTINAL BLEEDING SCAN  TECHNIQUE: Sequential abdominal images were obtained following intravenous administration of Tc-55m labeled red blood cells.  COMPARISON:  No comparison nuclear medicine exam. Comparison CT 05/20/2013.  RADIOPHARMACEUTICALS:  23.7 MILLI CURIE ULTRATAG TECHNETIUM TC 34M-LABELED RED BLOOD CELLS IV KITmCi Tc-21m in-vitro labeled red cells.  FINDINGS: No evidence of gastrointestinal hemorrhage identified. Exam was performed over 2 hours.  Prominent radiotracer uptake at the level of the penis. This was confirmed as related to the penis rather than the rectum on the lateral view.  Abdominal aortic aneurysm.  This is better assessed on recent CT.  IMPRESSION: No evidence of gastrointestinal bleed identified.  Please see above.   Electronically Signed   By: Bridgett Larsson M.D.   On: 05/21/2013 12:38    Assessment / Plan:   63. 79 year old male with new anemia, bowel changes (constipation) and intermittent ,self-limited rectal bleeding with hard stools. Ideally patient needs colonoscopy but  severe COPD, heart failure and multiple other co-morbidities  Increase risk for procedures.  He is diuresing on IV lasix. Hgb stablized between mid 10-mid 11 range now. INR has normalized.  Unless medical condition further deteriorates will most likely proceed with colonoscopy in a few days.  Patient getting colace but still constipated. He may be difficult to clean out for colonoscopy so will start working on constipation now. Add Miralax BID. Patient NPO right now. He can have clears today.    2. Diverticular  disease by CTscan   3. cholelithiasis, asymptomatic   4 Thrombocytopenia, chronic but slightly worse this admission. Spleen normal on CTscan.   5. Depression. He complains of feeling very depressed. On Zoloft 25mg  daily, can this be increased? Will defer to primary team  5. Multiple, significant co-morbidities including but not limited to AAA, atrial fibrillation on chronic coumadin, CAD/ CABG, severe COPD.     LOS: 2 days   Willette Cluster  05/22/2013, 9:23 AM   [facility] Attending  I have also seen and assessed the patient and agree with the above note. Will also add bisacodyl to help bowels move. Plan for colonoscopy Fri if medically ready.  The risks and benefits as well as alternatives of endoscopic procedure(s) have been discussed and reviewed. All questions answered. The patient agrees to proceed.  Iva Boop, MD, [facility] [phone] (pager) 05/22/2013 3:39 PM

## 2013-05-23 DIAGNOSIS — D62 Acute posthemorrhagic anemia: Secondary | ICD-10-CM | POA: Diagnosis not present

## 2013-05-23 DIAGNOSIS — J449 Chronic obstructive pulmonary disease, unspecified: Secondary | ICD-10-CM | POA: Diagnosis not present

## 2013-05-23 DIAGNOSIS — I714 Abdominal aortic aneurysm, without rupture: Secondary | ICD-10-CM | POA: Diagnosis not present

## 2013-05-23 DIAGNOSIS — I5033 Acute on chronic diastolic (congestive) heart failure: Secondary | ICD-10-CM | POA: Diagnosis not present

## 2013-05-23 LAB — CBC
HCT: 32.1 % — ABNORMAL LOW (ref 39.0–52.0)
Hemoglobin: 10.7 g/dL — ABNORMAL LOW (ref 13.0–17.0)
MCH: 29.9 pg (ref 26.0–34.0)
MCHC: 33.3 g/dL (ref 30.0–36.0)
MCV: 89.7 fL (ref 78.0–100.0)
Platelets: 110 10*3/uL — ABNORMAL LOW (ref 150–400)
RBC: 3.58 MIL/uL — ABNORMAL LOW (ref 4.22–5.81)
RDW: 14.6 % (ref 11.5–15.5)
WBC: 6.1 10*3/uL (ref 4.0–10.5)

## 2013-05-23 LAB — BLOOD GAS, ARTERIAL
Acid-base deficit: 1.1 mmol/L (ref 0.0–2.0)
BICARBONATE: 22.3 meq/L (ref 20.0–24.0)
Drawn by: 307971
FIO2: 0.21 %
O2 SAT: 95.6 %
PH ART: 7.416 (ref 7.350–7.450)
PO2 ART: 76.5 mmHg — AB (ref 80.0–100.0)
Patient temperature: 98.6
TCO2: 18.6 mmol/L (ref 0–100)
pCO2 arterial: 35.3 mmHg (ref 35.0–45.0)

## 2013-05-23 LAB — PRO B NATRIURETIC PEPTIDE: Pro B Natriuretic peptide (BNP): 696.8 pg/mL — ABNORMAL HIGH (ref 0–450)

## 2013-05-23 LAB — BASIC METABOLIC PANEL
BUN: 22 mg/dL (ref 6–23)
CO2: 26 mEq/L (ref 19–32)
Calcium: 8.2 mg/dL — ABNORMAL LOW (ref 8.4–10.5)
Chloride: 98 mEq/L (ref 96–112)
Creatinine, Ser: 1.17 mg/dL (ref 0.50–1.35)
GFR calc Af Amer: 68 mL/min — ABNORMAL LOW (ref >90)
GFR calc non Af Amer: 58 mL/min — ABNORMAL LOW (ref >90)
Glucose, Bld: 85 mg/dL (ref 70–99)
Potassium: 4.1 mEq/L (ref 3.7–5.3)
Sodium: 134 mEq/L — ABNORMAL LOW (ref 137–147)

## 2013-05-23 MED ORDER — PEG-KCL-NACL-NASULF-NA ASC-C 100 G PO SOLR
0.5000 | Freq: Once | ORAL | Status: AC
  Administered 2013-05-24: 04:00:00 100 g via ORAL
  Filled 2013-05-23: qty 1

## 2013-05-23 MED ORDER — PEG-KCL-NACL-NASULF-NA ASC-C 100 G PO SOLR: 1.0000 | Freq: Once | ORAL | Status: DC

## 2013-05-23 MED ORDER — PEG-KCL-NACL-NASULF-NA ASC-C 100 G PO SOLR
0.5000 | Freq: Once | ORAL | Status: AC
  Administered 2013-05-23: 100 g via ORAL

## 2013-05-23 NOTE — Progress Notes (Cosign Needed)
    Progress Note   Subjective  feels okay. Having bowel movement after starting regimen. No bleeding   Objective   Vital signs in last 24 hours: Temp:  [97.1 F (36.2 C)-98.7 F (37.1 C)] 97.7 F (36.5 C) (01/01 0500) Pulse Rate:  [68-88] 70 (01/01 0500) Resp:  [16-24] 24 (01/01 0500) BP: (100-136)/(52-67) 134/61 mmHg (01/01 0500) SpO2:  [98 %-99 %] 99 % (01/01 0500) Weight:  [210 lb 5.1 oz (95.4 kg)] 210 lb 5.1 oz (95.4 kg) (01/01 0600) Last BM Date: 05/22/13 General:    white male in NAD Heart:  Regular rate and rhythm Lungs: Respirations slightly labored. Lungs CTA bilaterally Abdomen:  Soft, nontender and nondistended. Normal bowel sounds. Neurologic:  Alert and oriented,  grossly normal neurologically. Psych:  Cooperative. Normal mood and affect.   Lab Results:  Recent Labs  05/21/13 0136  05/21/13 1722 05/22/13 0449 05/23/13 0420  WBC 6.8  --   --  6.4 6.1  HGB 10.7*  < > 11.6* 10.8* 10.7*  HCT 31.7*  < > 35.4* 32.7* 32.1*  PLT 111*  --   --  105* 110*  < > = values in this interval not displayed. BMET  Recent Labs  05/21/13 0136 05/22/13 0449 05/23/13 0420  NA 139 136* 134*  K 4.8 4.0 4.1  CL 101 98 98  CO2 29 28 26   GLUCOSE 98 100* 85  BUN 41* 29* 22  CREATININE 1.24 1.16 1.17  CALCIUM 8.4 8.3* 8.2*   PT/INR  Recent Labs  05/21/13 0136 05/21/13 1722  LABPROT 25.8* 15.5*  INR 2.45* 1.26     Assessment / Plan:   1. new anemia, bowel changes (constipation) and intermittent ,self-limited rectal bleeding with hard stools. Unless medical condition further deteriorates will most likely proceed with colonoscopy tomorrow. INR has normalized. His breathing is still a little concerning but sats okay on oxygen. He is diuresing. Will start bowel prep this evening.    2. Multiple, significant co-morbidities including but not limited to AAA, atrial fibrillation on chronic coumadin, CAD/ CABG, severe COPD.   3. Depression. On low dose of Zoloft. He  complains of depression    LOS: 3 days   Roberto Reilly  05/23/2013, 9:41 AM

## 2013-05-23 NOTE — Progress Notes (Addendum)
[facility] PROGRESS NOTE  Roberto Reilly TDV:[id number] DOB: Nov 13, 1933 DOA: 05/20/2013 PCP: Tommy Medal, MD  Assessment/Plan: Principal Problem:   Anemia: Patient has clearly had an acute bleed recently. The cause of it was likely be supratherapeutic INR. Patient had been on Coumadin for almost 10 years. I suspect that the acute problem she is having more from acute diastolic CHF leading to decreased Coumadin clinic because of secondary hepatic congestion. Her treating CHF, his INR levels were both drop and by keeping his diastolic heart failure and control, this will greatly help his Coumadin management. Hemoglobin remained stable. Gastroenterology plans for scope on Friday 1/2.  Active Problems: Abdominal aortic aneurysm:? New diagnosis. 4.5 cm, needs to be followed outpatient by vascular surgery. Likely would need medical management only    S/P CABG x 4: Concerned about elevated INR which may be from decreased creatinine clearance from secondary hepatic congestion from diastolic heart failure.   Diastolic heart failure: Suspected. BNP elevated and now resolved. Started Lasix and diuresed 1.5 L. Awaiting echocardiogram. Have stopped Lasix    Atrial fibrillation, chronic / persistent: Rate control. Need to make some decisions about anticoagulation. See above   HTN (hypertension): Stable    COPD (chronic obstructive pulmonary disease): Currently stable. Continue meds.  Patient is normally on oxygen when necessary. Satting 99% following diuresis so I discontinued his oxygen. Blood gas done after notes PO2 of 76 and normal PCO2.    Generalized anxiety disorder: Stable continue anxiolytics   Acute blood loss anemia: See above  Code Status: DO NOT RESUSCITATE  Family Communication: No family present  Disposition Plan: For colonoscopy tomorrow   Consultants:  [place] [facility]  Procedures:  Colonoscopy tomorrow  Antibiotics:  None  HPI/Subjective: Patient doing  okay. No complaints. No shortness of breath currently. No chest pain. Anxious about colonoscopy, worried that they may find colon cancer. I reassured him that while that was a possibility, it was a low index of suspicion  Objective: Filed Vitals:   05/23/13 1417  BP: 120/60  Pulse: 67  Temp: 98 F (36.7 C)  Resp: 20    Intake/Output Summary (Last 24 hours) at 05/23/13 1606 Last data filed at 05/23/13 1557  Gross per 24 hour  Intake   1800 ml  Output   1151 ml  Net    649 ml   Filed Weights   05/20/13 2130 05/22/13 0519 05/23/13 0600  Weight: 95.8 kg (211 lb 3.2 oz) 95.618 kg (210 lb 12.8 oz) 95.4 kg (210 lb 5.1 oz)    Exam:   General:  Alert and oriented x3, no acute distress  Cardiovascular: Irregular rhythm, rate controlled, 2/6 systolic ejection murmur  Respiratory: Decreased breath sounds throughout, minimal tachypnea  Abdomen: Soft, obese, nontender, positive bowel sounds  Musculoskeletal: No clubbing or cyanosis, trace edema   Data Reviewed: Basic Metabolic Panel:  Recent Labs Lab 05/20/13 1503 05/21/13 0136 05/22/13 0449 05/23/13 0420  NA 136 139 136* 134*  K 4.0 4.8 4.0 4.1  CL 98 101 98 98  CO2 _0 GLUCOSE 106* 98 100* 85  BUN 40* 41* 29* 22  CREATININE 1.24 1.24 1.16 1.17  CALCIUM 8.9 8.4 8.3* 8.2*   Liver Function Tests: No results found for this basename: AST, ALT, ALKPHOS, BILITOT, PROT, ALBUMIN,  in the last 168 hours No results found for this basename: LIPASE, AMYLASE,  in the last 168 hours No results found for this basename: AMMONIA,  in the last 168 hours CBC:  Recent  Labs Lab 05/20/13 1503 05/20/13 1830  05/21/13 0136 05/21/13 1239 05/21/13 1722 05/22/13 0449 05/23/13 0420  WBC 11.1* 8.3  --  6.8  --   --  6.4 6.1  NEUTROABS  --  6.2  --   --   --   --   --   --   HGB 12.7* 10.0*  < > 10.7* 11.7* 11.6* 10.8* 10.7*  HCT 37.9* 29.5*  < > 31.7* 36.2* 35.4* 32.7* 32.1*  MCV 88.8 89.1  --  88.8  --   --  89.8 89.7  PLT  130* 111*  --  111*  --   --  105* 110*  < > = values in this interval not displayed. Cardiac Enzymes: No results found for this basename: CKTOTAL, CKMB, CKMBINDEX, TROPONINI,  in the last 168 hours BNP (last 3 results)  Recent Labs  05/20/13 1517 05/21/13 1918 05/23/13 0420  PROBNP 425.6 1391.0* 696.8*   CBG: No results found for this basename: GLUCAP,  in the last 168 hours  No results found for this or any previous visit (from the past 240 hour(s)).   Studies: No results found.  Scheduled Meds: . amLODipine  10 mg Oral Daily  . budesonide-formoterol  2 puff Inhalation BID  . docusate sodium  100 mg Oral BID  . furosemide  20 mg Intravenous BID  . pantoprazole (PROTONIX) IV  40 mg Intravenous Q12H  . peg 3350 powder  0.5 kit Oral Once   And  . [START ON 05/24/2013] peg 3350 powder  0.5 kit Oral Once  . polyethylene glycol  17 g Oral BID  . potassium chloride SA  20 mEq Oral Daily  . sertraline  25 mg Oral Daily  . sodium chloride  3 mL Intravenous Q12H  . zolpidem  5 mg Oral QHS   Continuous Infusions:   Principal Problem:   Anemia Active Problems:   Obesity (BMI 30-39.9)   S/P CABG x 4   Atrial fibrillation, chronic / persistent   HTN (hypertension)   COPD (chronic obstructive pulmonary disease)   Generalized anxiety disorder   Acute blood loss anemia    Time spent: 25 minutes    [facility] Pager [phone]. If 7PM-7AM, please contact night-coverage at [email or web address], password [facility] 05/23/2013, 4:06 PM  LOS: 3 days

## 2013-05-24 ENCOUNTER — Inpatient Hospital Stay (HOSPITAL_COMMUNITY): Payer: Medicare Other | Admitting: Certified Registered Nurse Anesthetist

## 2013-05-24 ENCOUNTER — Ambulatory Visit: Payer: Medicare Other | Admitting: Cardiology

## 2013-05-24 ENCOUNTER — Encounter (HOSPITAL_COMMUNITY): Payer: Medicare Other | Admitting: Certified Registered Nurse Anesthetist

## 2013-05-24 ENCOUNTER — Encounter (HOSPITAL_COMMUNITY): Payer: Self-pay | Admitting: Certified Registered Nurse Anesthetist

## 2013-05-24 ENCOUNTER — Encounter (HOSPITAL_COMMUNITY)
Admission: EM | Disposition: A | Discharge status: Home / Self Care | Payer: Medicare Other | Source: Home / Self Care | Attending: Internal Medicine

## 2013-05-24 DIAGNOSIS — I4891 Unspecified atrial fibrillation: Secondary | ICD-10-CM | POA: Diagnosis not present

## 2013-05-24 DIAGNOSIS — K5731 Diverticulosis of large intestine without perforation or abscess with bleeding: Principal | ICD-10-CM

## 2013-05-24 DIAGNOSIS — K648 Other hemorrhoids: Secondary | ICD-10-CM | POA: Diagnosis not present

## 2013-05-24 DIAGNOSIS — D696 Thrombocytopenia, unspecified: Secondary | ICD-10-CM | POA: Diagnosis not present

## 2013-05-24 DIAGNOSIS — I5033 Acute on chronic diastolic (congestive) heart failure: Secondary | ICD-10-CM | POA: Diagnosis not present

## 2013-05-24 DIAGNOSIS — J449 Chronic obstructive pulmonary disease, unspecified: Secondary | ICD-10-CM | POA: Diagnosis not present

## 2013-05-24 DIAGNOSIS — J9610 Chronic respiratory failure, unspecified whether with hypoxia or hypercapnia: Secondary | ICD-10-CM | POA: Diagnosis not present

## 2013-05-24 DIAGNOSIS — D62 Acute posthemorrhagic anemia: Secondary | ICD-10-CM | POA: Diagnosis not present

## 2013-05-24 HISTORY — DX: Diverticulosis of large intestine without perforation or abscess with bleeding: K57.31

## 2013-05-24 HISTORY — PX: COLONOSCOPY: SHX5424

## 2013-05-24 LAB — CBC
HEMATOCRIT: 32.9 % — AB (ref 39.0–52.0)
Hemoglobin: 11.2 g/dL — ABNORMAL LOW (ref 13.0–17.0)
MCH: 30 pg (ref 26.0–34.0)
MCHC: 34 g/dL (ref 30.0–36.0)
MCV: 88.2 fL (ref 78.0–100.0)
PLATELETS: 139 10*3/uL — AB (ref 150–400)
RBC: 3.73 MIL/uL — ABNORMAL LOW (ref 4.22–5.81)
RDW: 15 % (ref 11.5–15.5)
WBC: 6.6 10*3/uL (ref 4.0–10.5)

## 2013-05-24 LAB — BASIC METABOLIC PANEL
BUN: 18 mg/dL (ref 6–23)
CALCIUM: 8.6 mg/dL (ref 8.4–10.5)
CHLORIDE: 97 meq/L (ref 96–112)
CO2: 24 mEq/L (ref 19–32)
CREATININE: 1.24 mg/dL (ref 0.50–1.35)
GFR calc non Af Amer: 54 mL/min — ABNORMAL LOW (ref >90)
GFR, EST AFRICAN AMERICAN: 63 mL/min — AB (ref >90)
Glucose, Bld: 89 mg/dL (ref 70–99)
Potassium: 4.4 mEq/L (ref 3.7–5.3)
Sodium: 136 mEq/L — ABNORMAL LOW (ref 137–147)

## 2013-05-24 SURGERY — COLONOSCOPY
Anesthesia: Monitor Anesthesia Care

## 2013-05-24 MED ORDER — ONDANSETRON HCL 4 MG/2ML IJ SOLN
INTRAMUSCULAR | Status: AC
  Filled 2013-05-24: qty 2

## 2013-05-24 MED ORDER — LACTATED RINGERS IV SOLN
INTRAVENOUS | Status: DC
  Administered 2013-05-24: 1000 mL via INTRAVENOUS

## 2013-05-24 MED ORDER — ONDANSETRON HCL 4 MG/2ML IJ SOLN
INTRAMUSCULAR | Status: DC | PRN
  Administered 2013-05-24: 4 mg via INTRAVENOUS

## 2013-05-24 MED ORDER — KETAMINE HCL 10 MG/ML IJ SOLN
INTRAMUSCULAR | Status: DC | PRN
  Administered 2013-05-24: 20 mg via INTRAVENOUS

## 2013-05-24 MED ORDER — PROPOFOL 10 MG/ML IV BOLUS
INTRAVENOUS | Status: AC
  Filled 2013-05-24: qty 40

## 2013-05-24 MED ORDER — WARFARIN SODIUM 5 MG PO TABS
5.0000 mg | ORAL_TABLET | ORAL | Status: AC
  Administered 2013-05-24: 5 mg via ORAL
  Filled 2013-05-24: qty 1

## 2013-05-24 MED ORDER — PROPOFOL INFUSION 10 MG/ML OPTIME
INTRAVENOUS | Status: DC | PRN
  Administered 2013-05-24: 50 ug/kg/min via INTRAVENOUS

## 2013-05-24 MED ORDER — WARFARIN - PHYSICIAN DOSING INPATIENT: Freq: Every day | Status: DC

## 2013-05-24 NOTE — Anesthesia Preprocedure Evaluation (Signed)
Anesthesia Evaluation  Patient identified by MRN, date of birth, ID band Patient awake    Reviewed: Allergy & Precautions, H&P , NPO status , Patient's Chart, lab work & pertinent test results  Airway       Dental  (+) Dental Advisory Given   Pulmonary COPD COPD inhaler, former smoker,          Cardiovascular hypertension, Pt. on medications + CAD, + Past MI, + CABG and + Peripheral Vascular Disease + dysrhythmias     Neuro/Psych PSYCHIATRIC DISORDERS Depression negative neurological ROS     GI/Hepatic negative GI ROS, Neg liver ROS,   Endo/Other  negative endocrine ROS  Renal/GU negative Renal ROS     Musculoskeletal negative musculoskeletal ROS (+)   Abdominal   Peds  Hematology  (+) Blood dyscrasia, anemia ,   Anesthesia Other Findings   Reproductive/Obstetrics                           Anesthesia Physical Anesthesia Plan  ASA: III  Anesthesia Plan: MAC   Post-op Pain Management:    Induction: Intravenous  Airway Management Planned:   Additional Equipment:   Intra-op Plan:   Post-operative Plan:   Informed Consent: I have reviewed the patients History and Physical, chart, labs and discussed the procedure including the risks, benefits and alternatives for the proposed anesthesia with the patient or authorized representative who has indicated his/her understanding and acceptance.   Dental advisory given  Plan Discussed with: CRNA  Anesthesia Plan Comments:         Anesthesia Quick Evaluation

## 2013-05-24 NOTE — Anesthesia Postprocedure Evaluation (Signed)
Anesthesia Post Note  Patient: Roberto Reilly  Procedure(s) Performed: Procedure(s) (LRB): COLONOSCOPY (N/A)  Anesthesia type: MAC  Patient location: PACU  Post pain: Pain level controlled  Post assessment: Post-op Vital signs reviewed  Last Vitals: BP 98/54  Pulse 68  Temp(Src) 36.4 C (Oral)  Resp 19  Ht 6' (1.829 m)  Wt 210 lb (95.255 kg)  BMI 28.47 kg/m2  SpO2 98%  Post vital signs: Reviewed  Level of consciousness: awake  Complications: No apparent anesthesia complications

## 2013-05-24 NOTE — Progress Notes (Signed)
[facility] PROGRESS NOTE  Roberto Reilly ZOX:[id number] DOB: 19-Jan-1934 DOA: 05/20/2013 PCP: Juline Patch, MD  Assessment/Plan: Principal Problem:   Anemia: Patient has clearly had an acute bleed recently. The cause of it was likely be supratherapeutic INR. Patient had been on Coumadin for almost 10 years. I suspect that the acute problem she is having more from acute diastolic CHF leading to decreased Coumadin clinic because of secondary hepatic congestion. Her treating CHF, his INR levels were both drop and by keeping his diastolic heart failure and control, this will greatly help his Coumadin management. Hemoglobin remained stable. Colonoscopy noted diverticulosis, likely source of bleeding. Restart Coumadin tonight, of fluids do better controlled now that he is diuresed  Active Problems: Abdominal aortic aneurysm:? New diagnosis. 4.5 cm, needs to be followed outpatient by vascular surgery. Likely would need medical management only    S/P CABG x 4: Concerned about elevated INR which may be from decreased creatinine clearance from secondary hepatic congestion from diastolic heart failure.   Diastolic heart failure: Suspected. BNP elevated and now resolved. Started Lasix and diuresed 1.5 L. Awaiting echocardiogram. Continue home dose of Lasix upon discharge    Atrial fibrillation, chronic / persistent: Rate control. We'll resume Coumadin.    HTN (hypertension): Stable    COPD (chronic obstructive pulmonary disease): Currently stable. Continue meds.  Patient is normally on oxygen when necessary. Satting 99% following diuresis so I discontinued his oxygen. Blood gas done after notes PO2 of 76 and normal PCO2.    Generalized anxiety disorder: Stable continue anxiolytics   Acute blood loss anemia: See above  Code Status: DO NOT RESUSCITATE  Family Communication: No family present  Disposition Plan: Home tomorrow   Consultants:  [facility]  Procedures:  Colonoscopy done 1/2: Noting diverticulosis  Antibiotics:  None  HPI/Subjective: Patient doing okay. No complaints. Tolerating colonoscopy and relieved no signs of cancer.  Objective: Filed Vitals:   05/24/13 1313  BP: 131/60  Pulse: 70  Temp: 98.3 F (36.8 C)  Resp: 20    Intake/Output Summary (Last 24 hours) at 05/24/13 1928 Last data filed at 05/24/13 1853  Gross per 24 hour  Intake   1845 ml  Output    620 ml  Net   1225 ml   Filed Weights   05/23/13 0600 05/24/13 0501 05/24/13 0812  Weight: 95.4 kg (210 lb 5.1 oz) 95.5 kg (210 lb 8.6 oz) 95.255 kg (210 lb)    Exam:   General:  Alert and oriented x3, no acute distress  Cardiovascular: Irregular rhythm, rate controlled, 2/6 systolic ejection murmur  Respiratory: Decreased breath sounds throughout, minimal tachypnea  Abdomen: Soft, obese, nontender, positive bowel sounds  Musculoskeletal: No clubbing or cyanosis, trace edema   Data Reviewed: Basic Metabolic Panel:  Recent Labs Lab 05/20/13 1503 05/21/13 0136 05/22/13 0449 05/23/13 0420 05/24/13 0439  NA 136 139 136* 134* 136*  K 4.0 4.8 4.0 4.1 4.4  CL 98 101 98 98 97  CO2 25 29 28 26 24   GLUCOSE 106* 98 100* 85 89  BUN 40* 41* 29* 22 18  CREATININE 1.24 1.24 1.16 1.17 1.24  CALCIUM 8.9 8.4 8.3* 8.2* 8.6   Liver Function Tests: No results found for this basename: AST, ALT, ALKPHOS, BILITOT, PROT, ALBUMIN,  in the last 168 hours No results found for this basename: LIPASE, AMYLASE,  in the last 168 hours No results found for this basename: AMMONIA,  in the last 168 hours CBC:  Recent Labs Lab 05/20/13 1830  05/21/13 0136 05/21/13 1239 05/21/13 1722 05/22/13 0449 05/23/13 0420 05/24/13 0439  WBC 8.3  --  6.8  --   --  6.4 6.1 6.6  NEUTROABS 6.2  --   --   --   --   --   --   --   HGB 10.0*  < > 10.7* 11.7* 11.6* 10.8* 10.7* 11.2*  HCT 29.5*  < > 31.7* 36.2* 35.4* 32.7* 32.1* 32.9*  MCV 89.1  --  88.8   --   --  89.8 89.7 88.2  PLT 111*  --  111*  --   --  105* 110* 139*  < > = values in this interval not displayed. Cardiac Enzymes: No results found for this basename: CKTOTAL, CKMB, CKMBINDEX, TROPONINI,  in the last 168 hours BNP (last 3 results)  Recent Labs  05/20/13 1517 05/21/13 1918 05/23/13 0420  PROBNP 425.6 1391.0* 696.8*   CBG: No results found for this basename: GLUCAP,  in the last 168 hours  No results found for this or any previous visit (from the past 240 hour(s)).   Studies: No results found.  Scheduled Meds: . amLODipine  10 mg Oral Daily  . budesonide-formoterol  2 puff Inhalation BID  . docusate sodium  100 mg Oral BID  . furosemide  20 mg Intravenous BID  . pantoprazole (PROTONIX) IV  40 mg Intravenous Q12H  . polyethylene glycol  17 g Oral BID  . potassium chloride SA  20 mEq Oral Daily  . sertraline  25 mg Oral Daily  . sodium chloride  3 mL Intravenous Q12H  . warfarin  5 mg Oral NOW  . zolpidem  5 mg Oral QHS   Continuous Infusions:   Principal Problem:   Anemia Active Problems:   Obesity (BMI 30-39.9)   S/P CABG x 4   Atrial fibrillation, chronic / persistent   HTN (hypertension)   COPD (chronic obstructive pulmonary disease)   Generalized anxiety disorder   Acute blood loss anemia    Time spent: 20 minutes    [name],[name]  [facility] Pager [phone]. If 7PM-7AM, please contact night-coverage at [email or web address], password [email or web address] 05/24/2013, 7:28 PM  LOS: 4 days

## 2013-05-24 NOTE — Op Note (Signed)
[facility] [address] [place], [id number]   COLONOSCOPY PROCEDURE REPORT  PATIENT: Roberto Reilly, Roberto Reilly  MR#: [id number] BIRTHDATE: 02-07-1934 , 79  yrs. old GENDER: Male ENDOSCOPIST: Iva Booparl E Arianis Bowditch, MD, [facility] PROCEDURE DATE:  05/24/2013 PROCEDURE:   Colonoscopy, diagnostic First Screening Colonoscopy - Avg.  risk and is 50 yrs.  old or older - No.  Prior Negative Screening - Now for repeat screening. N/A  History of Adenoma - Now for follow-up colonoscopy & has been > or = to 3 yrs.  N/A  Polyps Removed Today? No.  Recommend repeat exam, <10 yrs? No. ASA CLASS:   Class III INDICATIONS:Rectal Bleeding and Anemia MEDICATIONS: See Anesthesia Report.  DESCRIPTION OF PROCEDURE:   After the risks benefits and alternatives of the procedure were thoroughly explained, informed consent was obtained.  A digital rectal exam revealed an enlarged prostate.   The     endoscope was introduced through the anus and advanced to the cecum, which was identified by both the appendix and ileocecal valve. No adverse events experienced.   The quality of the prep was adequate, using MoviPrep  The instrument was then slowly withdrawn as the colon was fully examined.      COLON FINDINGS: Moderate diverticulosis was noted in the sigmoid colon.   The colon mucosa was otherwise normal.  Retroflexed views revealed internal hemorrhoids. The time to cecum=2 minutes 0 seconds.  Withdrawal time=6 minutes 0 seconds.  The scope was withdrawn and the procedure completed. COMPLICATIONS: There were no complications.  ENDOSCOPIC IMPRESSION: 1.   Moderate diverticulosis was noted in the sigmoid colon - I suspect he bled from this and caused anemia 2.   The colon mucosa was otherwise normal - adequate prep 3.    Internal hemorroids seen  RECOMMENDATIONS: Restart warfarin in 2 weeks Low residue diet Home today/tomorrow from GI perspective PCP f/u - GI f/u not needed except prn   eSigned:  Iva Booparl  E Nelson Julson, MD, [facility] 05/24/2013 9:35 AM   cc: Juline Patchichard Pang, MD

## 2013-05-24 NOTE — Transfer of Care (Signed)
Immediate Anesthesia Transfer of Care Note  Patient: Roberto ChristmasJames Reilly  Procedure(s) Performed: Procedure(s) (LRB): COLONOSCOPY (N/A)  Patient Location: PACU  Anesthesia Type: MAC  Level of Consciousness: sedated, patient cooperative and responds to stimulation  Airway & Oxygen Therapy: Patient Spontanous Breathing and Patient connected to face mask oxgen  Post-op Assessment: Report given to PACU RN and Post -op Vital signs reviewed and stable  Post vital signs: Reviewed and stable  Complications: No apparent anesthesia complications

## 2013-05-25 DIAGNOSIS — I714 Abdominal aortic aneurysm, without rupture, unspecified: Secondary | ICD-10-CM

## 2013-05-25 DIAGNOSIS — J449 Chronic obstructive pulmonary disease, unspecified: Secondary | ICD-10-CM | POA: Diagnosis not present

## 2013-05-25 DIAGNOSIS — I5033 Acute on chronic diastolic (congestive) heart failure: Secondary | ICD-10-CM | POA: Diagnosis not present

## 2013-05-25 DIAGNOSIS — I369 Nonrheumatic tricuspid valve disorder, unspecified: Secondary | ICD-10-CM | POA: Diagnosis not present

## 2013-05-25 DIAGNOSIS — D62 Acute posthemorrhagic anemia: Secondary | ICD-10-CM | POA: Diagnosis not present

## 2013-05-25 LAB — PROTIME-INR
INR: 1.2 (ref 0.00–1.49)
Prothrombin Time: 14.9 seconds (ref 11.6–15.2)

## 2013-05-25 MED ORDER — DSS 100 MG PO CAPS: 100.0000 mg | ORAL_CAPSULE | Freq: Two times a day (BID) | ORAL | Status: DC

## 2013-05-25 NOTE — Discharge Instructions (Addendum)
RESTART COUMADIN ON Thursday, 06/06/13    Heart Failure Heart failure is a condition in which the heart has trouble pumping blood. This means your heart does not pump blood efficiently for your body to work well. In some cases of heart failure, fluid may back up into your lungs or you may have swelling (edema) in your lower legs. Heart failure is usually a long-term (chronic) condition. It is important for you to take good care of yourself and follow your caregiver's treatment plan. CAUSES  Some health conditions can cause heart failure. Those health conditions include:  High blood pressure (hypertension) causes the heart muscle to work harder than normal. When pressure in the blood vessels is high, the heart needs to pump (contract) with more force in order to circulate blood throughout the body. High blood pressure eventually causes the heart to become stiff and weak.  Coronary artery disease (CAD) is the buildup of cholesterol and fat (plaque) in the arteries of the heart. The blockage in the arteries deprives the heart muscle of oxygen and blood. This can cause chest pain and may lead to a heart attack. High blood pressure can also contribute to CAD.  Heart attack (myocardial infarction) occurs when 1 or more arteries in the heart become blocked. The loss of oxygen damages the muscle tissue of the heart. When this happens, part of the heart muscle dies. The injured tissue does not contract as well and weakens the heart's ability to pump blood.  Abnormal heart valves can cause heart failure when the heart valves do not open and close properly. This makes the heart muscle pump harder to keep the blood flowing.  Heart muscle disease (cardiomyopathy or myocarditis) is damage to the heart muscle from a variety of causes. These can include drug or alcohol abuse, infections, or unknown reasons. These can increase the risk of heart failure.  Lung disease makes the heart work harder because the lungs do  not work properly. This can cause a strain on the heart, leading it to fail.  Diabetes increases the risk of heart failure. High blood sugar contributes to high fat (lipid) levels in the blood. Diabetes can also cause slow damage to tiny blood vessels that carry important nutrients to the heart muscle. When the heart does not get enough oxygen and food, it can cause the heart to become weak and stiff. This leads to a heart that does not contract efficiently.  Other conditions can contribute to heart failure. These include abnormal heart rhythms, thyroid problems, and low blood counts (anemia). Certain unhealthy behaviors can increase the risk of heart failure. Those unhealthy behaviors include:  Being overweight.  Smoking or chewing tobacco.  Eating foods high in fat and cholesterol.  Abusing illicit drugs or alcohol.  Lacking physical activity. SYMPTOMS  Heart failure symptoms may vary and can be hard to detect. Symptoms may include:  Shortness of breath with activity, such as climbing stairs.  Persistent cough.  Swelling of the feet, ankles, legs, or abdomen.  Unexplained weight gain.  Difficulty breathing when lying flat (orthopnea).  Waking from sleep because of the need to sit up and get more air.  Rapid heartbeat.  Fatigue and loss of energy.  Feeling lightheaded, dizzy, or close to fainting.  Loss of appetite.  Nausea.  Increased urination during the night (nocturia). DIAGNOSIS  A diagnosis of heart failure is based on your history, symptoms, physical examination, and diagnostic tests. Diagnostic tests for heart failure may include:  Echocardiography.  Electrocardiography.  Chest X-ray.  Blood tests.  Exercise stress test.  Cardiac angiography.  Radionuclide scans. TREATMENT  Treatment is aimed at managing the symptoms of heart failure. Medicines, behavioral changes, or surgical intervention may be necessary to treat heart failure.  Medicines to  help treat heart failure may include:  Angiotensin-converting enzyme (ACE) inhibitors. This type of medicine blocks the effects of a blood protein called angiotensin-converting enzyme. ACE inhibitors relax (dilate) the blood vessels and help lower blood pressure.  Angiotensin receptor blockers. This type of medicine blocks the actions of a blood protein called angiotensin. Angiotensin receptor blockers dilate the blood vessels and help lower blood pressure.  Water pills (diuretics). Diuretics cause the kidneys to remove salt and water from the blood. The extra fluid is removed through urination. This loss of extra fluid lowers the volume of blood the heart pumps.  Beta blockers. These prevent the heart from beating too fast and improve heart muscle strength.  Digitalis. This increases the force of the heartbeat.  Healthy behavior changes include:  Obtaining and maintaining a healthy weight.  Stopping smoking or chewing tobacco.  Eating heart healthy foods.  Limiting or avoiding alcohol.  Stopping illicit drug use.  Physical activity as directed by your caregiver.  Surgical treatment for heart failure may include:  A procedure to open blocked arteries, repair damaged heart valves, or remove damaged heart muscle tissue.  A pacemaker to improve heart muscle function and control certain abnormal heart rhythms.  An internal cardioverter defibrillator to treat certain serious abnormal heart rhythms.  A left ventricular assist device to assist the pumping ability of the heart. HOME CARE INSTRUCTIONS   Take your medicine as directed by your caregiver. Medicines are important in reducing the workload of your heart, slowing the progression of heart failure, and improving your symptoms.  Do not stop taking your medicine unless directed by your caregiver.  Do not skip any dose of medicine.  Refill your prescriptions before you run out of medicine. Your medicines are needed every  day.  Take over-the-counter medicine only as directed by your caregiver or pharmacist.  Engage in moderate physical activity if directed by your caregiver. Moderate physical activity can benefit some people. The elderly and people with severe heart failure should consult with a caregiver for physical activity recommendations.  Eat heart healthy foods. Food choices should be free of trans fat and low in saturated fat, cholesterol, and salt (sodium). Healthy choices include fresh or frozen fruits and vegetables, fish, lean meats, legumes, fat-free or low-fat dairy products, and whole grain or high fiber foods. Talk to a dietitian to learn more about heart healthy foods.  Limit sodium if directed by your caregiver. Sodium restriction may reduce symptoms of heart failure in some people. Talk to a dietitian to learn more about heart healthy seasonings.  Use healthy cooking methods. Healthy cooking methods include roasting, grilling, broiling, baking, poaching, steaming, or stir-frying. Talk to a dietitian to learn more about healthy cooking methods.  Limit fluids if directed by your caregiver. Fluid restriction may reduce symptoms of heart failure in some people.  Weigh yourself every day. Daily weights are important in the early recognition of excess fluid. You should weigh yourself every morning after you urinate and before you eat breakfast. Wear the same amount of clothing each time you weigh yourself. Record your daily weight. Provide your caregiver with your weight record.  Monitor and record your blood pressure if directed by your caregiver.  Check your pulse if directed by  your caregiver.  Lose weight if directed by your caregiver. Weight loss may reduce symptoms of heart failure in some people.  Stop smoking or chewing tobacco. Nicotine makes your heart work harder by causing your blood vessels to constrict. Do not use nicotine gum or patches before talking to your caregiver.  Schedule  and attend follow-up visits as directed by your caregiver. It is important to keep all your appointments.  Limit alcohol intake to no more than 1 drink per day for nonpregnant women and 2 drinks per day for men. Drinking more than that is harmful to your heart. Tell your caregiver if you drink alcohol several times a week. Talk with your caregiver about whether alcohol is safe for you. If your heart has already been damaged by alcohol or you have severe heart failure, drinking alcohol should be stopped completely.  Stop illicit drug use.  Stay up-to-date with immunizations. It is especially important to prevent respiratory infections through current pneumococcal and influenza immunizations.  Manage other health conditions such as hypertension, diabetes, thyroid disease, or abnormal heart rhythms as directed by your caregiver.  Learn to manage stress.  Plan rest periods when fatigued.  Learn strategies to manage high temperatures. If the weather is extremely hot:  Avoid vigorous physical activity.  Use air conditioning or fans or seek a cooler location.  Avoid caffeine and alcohol.  Wear loose-fitting, lightweight, and light-colored clothing.  Learn strategies to manage cold temperatures. If the weather is extremely cold:  Avoid vigorous physical activity.  Layer clothes.  Wear mittens or gloves, a hat, and a scarf when going outside.  Avoid alcohol.  Obtain ongoing education and support as needed.  Participate or seek rehabilitation as needed to maintain or improve independence and quality of life. SEEK MEDICAL CARE IF:   Your weight increases by 03 lb/1.4 kg in 1 day or 05 lb/2.3 kg in a week.  You have increasing shortness of breath that is unusual for you.  You are unable to participate in your usual physical activities.  You tire easily.  You cough more than normal, especially with physical activity.  You have any or more swelling in areas such as your hands,  feet, ankles, or abdomen.  You are unable to sleep because it is hard to breathe.  You feel like your heart is beating fast (palpitations).  You become dizzy or lightheaded upon standing up. SEEK IMMEDIATE MEDICAL CARE IF:   You have difficulty breathing.  There is a change in mental status such as decreased alertness or difficulty with concentration.  You have a pain or discomfort in your chest.  You have an episode of fainting (syncope). MAKE SURE YOU:   Understand these instructions.  Will watch your condition.  Will get help right away if you are not doing well or get worse. Document Released: 05/09/2005 Document Revised: 09/03/2012 Document Reviewed: 05/31/2012 [facility] Patient Information 2014 [place], [place].

## 2013-05-25 NOTE — Progress Notes (Signed)
  Echocardiogram 2D Echocardiogram has been performed.  Arvil ChacoFoster, Salle Brandle 05/25/2013, 11:10 AM

## 2013-05-25 NOTE — Discharge Summary (Signed)
Physician Discharge Summary  Roberto Reilly ZOX:[id number] DOB: December 25, 1933 DOA: 05/20/2013  PCP: Juline Patch, MD  Admit date: 05/20/2013 Discharge date: 05/25/2013  Time spent: 25 minutes  Recommendations for Outpatient Follow-up:  1. Patient will followup with his primary care physician in the next few weeks 2. Patient will restart Coumadin on 1/15 3. Patient has a new diagnosis of diastolic heart failure. He has received information about this. 4. Patient will followup with vascular surgery for new diagnosis of abdominal aortic aneurysm at 4.5 cm.  Discharge Diagnoses:  Principal Problem:   Anemia Active Problems:   Obesity (BMI 30-39.9)   S/P CABG x 4   Atrial fibrillation, chronic / persistent   HTN (hypertension)   COPD (chronic obstructive pulmonary disease)   Generalized anxiety disorder   Rectal bleeding   AAA (abdominal aortic aneurysm) without rupture   Acute on chronic diastolic heart failure   Diverticulosis of colon with hemorrhage   Internal hemorrhoids   Discharge Condition: Improved, being discharged home  Diet recommendation: Heart healthy  Filed Weights   05/24/13 0501 05/24/13 0812 05/25/13 0511  Weight: 95.5 kg (210 lb 8.6 oz) 95.255 kg (210 lb) 95.8 kg (211 lb 3.2 oz)    History of present illness:  80 year old gentleman with history of chronic respiratory failure secondary to COPD as well as atrial fibrillation on chronic Coumadin who has had frequent hospitalizations in the past month presented with shortness of breath on 12/29. He actually looked to be at his baseline but what was concerning was his hemoglobin had dropped from 16 down to 12 since 12 days prior. Patient has never had a colonoscopy. His vital signs were otherwise stable. At time of admission, patient's INR was noted to be supratherapeutic at 4.68. Patient had reported self-limited rectal bleeding with bright red blood. He was admitted to the hospitalist service for further evaluation.  Gastroenterology was consulted.  Hospital Course:  Principal Problem:   Anemia: Subacute blood loss likely from self contained diverticular bleeding exacerbated by elevated INR. Patient was evaluated by gastroenterology. He wanted to make sure from a pulmonary standpoint, he would be stable for colonoscopy. In addition there also waiting for his INR to normalize. Colonoscopy done 1/2 noting nonbleeding diverticula, but in the setting of elevated INR, this was felt to be the source. Active Problems:   Obesity (BMI 30-39.9)   S/P CABG x 4: Stable    Atrial fibrillation, chronic / persistent: Heart rate controlled. Restart anticoagulation on 1/15  Coagulopathy: Secondary to decreased Coumadin clearance from hepatic congestion from diastolic heart failure. Patient was initially treated with vitamin K and FFP for concerns of immediate GI bleed. He should not have further issues with coagulopathy as long as heart failure is managed. Patient has been on anticoagulation for almost 10 years and only in the last few months started to have problems with elevated INR, likely secondary to development of diastolic heart failure    HTN (hypertension)   COPD (chronic obstructive pulmonary disease): Chronic disease. By keeping patient properly diuresed, this is greatly improved his breathing. Patient was satting 99% on room air by discharge. :   Generalized anxiety disorder: Stable. Continue on home benzodiazepines.    Rectal bleeding: Likely from hemorrhoids and diverticular bleeding.    AAA (abdominal aortic aneurysm) without rupture: Patient had abdominal CT done on admission noting 4.5 cm abdominal aortic aneurysm. Fortunately, is currently in criteria of being monitored. Admit patient appointment with vascular surgery for followup.    Acute on chronic diastolic  heart failure: Given history of chronic lung disease, concerning for cor pulmonale and secondary heart failure. BNP checked and found to be mildly  elevated  Diverticulosis of colon with hemorrhage: This was felt to be likely the cause of patient's bleeding, exacerbated by elevated INR. Given cause for elevated INR, patient was felt to be stable. He had no episodes of bleeding during his hospitalization. Gastroenterology recommends restarting Coumadin in 2 weeks   Internal hemorrhoids  Procedures:  2-D echocardiogram done 1/3: Normal systolic function, but unable to comment on diastolic dysfunction  Colonoscopy done 1/2: Nonbleeding diverticulosis noted  Consultations:  [place] [facility]  Discharge Exam: Filed Vitals:   05/25/13 1410  BP: 120/62  Pulse: 70  Temp: 99 F (37.2 C)  Resp: 20    General: Alert and oriented x3, no acute distress Cardiovascular: Irregular rhythm, rate controlled Respiratory: Decreased breath sounds throughout  Discharge Instructions  Discharge Orders   Future Appointments Provider Department Dept Phone   08/19/2013 2:15 PM Sherrie George, MD [facility] [phone]   09/10/2013 11:00 AM Waymon Budge, MD [facility] [phone]   Future Orders Complete By Expires   Diet - low sodium heart healthy  As directed    Increase activity slowly  As directed        Medication List    STOP taking these medications       predniSONE 10 MG tablet  Commonly known as:  DELTASONE      TAKE these medications       amLODipine 10 MG tablet  Commonly known as:  NORVASC  Take 1 tablet (10 mg total) by mouth daily.     aspirin 81 MG tablet  Take 81 mg by mouth daily.     atorvastatin 20 MG tablet  Commonly known as:  LIPITOR  Take 1 tablet (20 mg total) by mouth daily at 6 PM.     budesonide-formoterol 160-4.5 MCG/ACT inhaler  Commonly known as:  SYMBICORT  Inhale 2 puffs into the lungs 2 (two) times daily.     cholecalciferol 1000 UNITS tablet  Commonly known as:  VITAMIN D  Take 1,000 Units by mouth daily.     clonazePAM 0.5 MG tablet   Commonly known as:  KLONOPIN  Take 0.5 mg by mouth 3 (three) times daily as needed for anxiety.     DSS 100 MG Caps  Take 100 mg by mouth 2 (two) times daily.     DUONEB 0.5-2.5 (3) MG/3ML Soln  Generic drug:  ipratropium-albuterol  Take 3 mLs by nebulization every 6 (six) hours as needed (wheezing and shortness of breath).     furosemide 40 MG tablet  Commonly known as:  LASIX  TAKE ONE TABLET BY MOUTH EVERY MORNING     potassium chloride SA 20 MEQ tablet  Commonly known as:  K-DUR,KLOR-CON  Take 20 mEq by mouth daily.     sertraline 25 MG tablet  Commonly known as:  ZOLOFT  Take 1 tablet (25 mg total) by mouth daily.     warfarin 2.5 MG tablet  Commonly known as:  COUMADIN  Take 2.5-5 mg by mouth daily. Takes 1 tab (2.5mg ) daily except 2 tabs (5mg ) Mon/Thurs     zolpidem 10 MG tablet  Commonly known as:  AMBIEN  Take 5 mg by mouth at bedtime.       Allergies  Allergen Reactions  . Prednisone     Makes pt jittery and nervous  Follow-up Information   Follow up with PANG,RICHARD, MD In 1 month.   Specialty:  Internal Medicine   Contact information:   [address] [place] [address] [phone]       Schedule an appointment as soon as possible for a visit with [facility]. (Vascular Surgeons-call to make appt for followup in regards to aortic aneurysm)    Specialty:  Vascular Surgery   Contact information:   [address] [place] [address] [phone]       The results of significant diagnostics from this hospitalization (including imaging, microbiology, ancillary and laboratory) are listed below for reference.    Significant Diagnostic Studies: Dg Chest 2 View (if Patient Has Fever And/or Copd)  05/20/2013   CLINICAL DATA:  Shortness of breath.  COPD.  Atrial fibrillation.  EXAM: CHEST  2 VIEW  COMPARISON:  05/07/2013  FINDINGS: The patient is rotated to the Right on today's radiograph,  reducing diagnostic sensitivity and specificity. Prior CABG noted. Continued chronic a obscuration of the right hemidiaphragmatic margin. Bandlike airspace opacity medially at the right lung base. Prior right rib fractures and suspected right rib osteotomy.  Thoracic spondylosis. No significant blunting of the posterior costophrenic angles.  IMPRESSION: 1. Increased atelectasis in the right lower lobe medially. 2. Chronic blunting of the right costophrenic angle with remote right rib fractures and osteotomies.   Electronically Signed   By: Herbie Baltimore M.D.   On: 05/20/2013 15:50   Dg Chest 2 View  05/07/2013   CLINICAL DATA:  Shortness of breath and productive cough.  EXAM: CHEST  2 VIEW  COMPARISON:  04/29/2013  FINDINGS: There is chronic volume loss in the right hemithorax. No evidence for airspace disease or pulmonary edema. Post CABG changes in the chest. Heart size is grossly stable. There is a stable sclerotic density involving the left mid clavicle and this may be within the bone. Diffuse degenerative changes in the thoracic spine.  IMPRESSION: Chronic lung changes without acute findings.   Electronically Signed   By: Richarda Overlie M.D.   On: 05/07/2013 12:58   Dg Chest 2 View  04/29/2013   CLINICAL DATA:  Cough, weakness  EXAM: CHEST  2 VIEW  COMPARISON:  03/12/2013  FINDINGS: Cardiomediastinal silhouette is stable. Status post CABG. Stable volume loss right lung. Stable chronic blunting of the right costophrenic angle. No acute infiltrate or pulmonary edema. Degenerative changes thoracic spine again noted.  IMPRESSION: No active disease.  No significant change.   Electronically Signed   By: Natasha Mead M.D.   On: 04/29/2013 13:15   Nm Gi Blood Loss  05/21/2013   CLINICAL DATA:  Anemia.  EXAM: NUCLEAR MEDICINE GASTROINTESTINAL BLEEDING SCAN  TECHNIQUE: Sequential abdominal images were obtained following intravenous administration of Tc-40m labeled red blood cells.  COMPARISON:  No comparison  nuclear medicine exam. Comparison CT 05/20/2013.  RADIOPHARMACEUTICALS:  23.7 MILLI CURIE ULTRATAG TECHNETIUM TC 17M-LABELED RED BLOOD CELLS IV KITmCi Tc-21m in-vitro labeled red cells.  FINDINGS: No evidence of gastrointestinal hemorrhage identified. Exam was performed over 2 hours.  Prominent radiotracer uptake at the level of the penis. This was confirmed as related to the penis rather than the rectum on the lateral view.  Abdominal aortic aneurysm.  This is better assessed on recent CT.  IMPRESSION: No evidence of gastrointestinal bleed identified.  Please see above.   Electronically Signed   By: Bridgett Larsson M.D.   On: 05/21/2013 12:38   Ct Abdomen Pelvis  W Contrast  05/20/2013   CLINICAL DATA:  Internal bleeding. Patient on blood thinners. Anemia due to acute blood loss.  EXAM: CT ABDOMEN AND PELVIS WITH CONTRAST  TECHNIQUE: Multidetector CT imaging of the abdomen and pelvis was performed using the standard protocol following bolus administration of intravenous contrast.  CONTRAST:  50mL OMNIPAQUE IOHEXOL 300 MG/ML SOLN, 100mL OMNIPAQUE IOHEXOL 300 MG/ML SOLN  COMPARISON:  None.  FINDINGS: Mild pleural thickening and scarring in the right base. Coronary artery calcifications. Postoperative change in the mediastinum.  Cholelithiasis with small stones layering in the gallbladder. No gallbladder wall thickening or inflammation. The liver, spleen, pancreas, adrenal glands, kidneys, inferior vena cava, and retroperitoneal lymph nodes are unremarkable. The the abdominal aorta is tortuous with diffuse atherosclerotic calcification and focal aneurysm measuring 4.2 x 4.8 cm diameter. No occlusion or dissection is suggested. The stomach, small bowel, and colon are not abnormally distended. Prominent visceral adipose tissues. No free air or free fluid in the abdomen. No evidence of mesenteric or retroperitoneal hematoma.  Pelvis: Prostate gland is enlarged. Bladder wall is not thickened. No free or loculated pelvic  fluid collections. Diverticula in the sigmoid colon without evidence of diverticulitis. The appendix is not visualized. No significant lymphadenopathy in the pelvis. Soft tissues and body wall are unremarkable. Degenerative changes in the lumbar spine. No destructive bone lesions.  IMPRESSION: No evidence of intra-abdominal or pelvic hematoma. There is an abdominal aortic aneurysm measuring 4.2 x 4.8 cm. No evidence of rupture or dissection. Cholelithiasis.   Electronically Signed   By: Burman NievesWilliam  Stevens M.D.   On: 05/20/2013 21:15    Microbiology: No results found for this or any previous visit (from the past 240 hour(s)).   Labs: Basic Metabolic Panel:  Recent Labs Lab 05/20/13 1503 05/21/13 0136 05/22/13 0449 05/23/13 0420 05/24/13 0439  NA 136 139 136* 134* 136*  K 4.0 4.8 4.0 4.1 4.4  CL 98 101 98 98 97  CO2 25 29 28 26 24   GLUCOSE 106* 98 100* 85 89  BUN 40* 41* 29* 22 18  CREATININE 1.24 1.24 1.16 1.17 1.24  CALCIUM 8.9 8.4 8.3* 8.2* 8.6   Liver Function Tests: No results found for this basename: AST, ALT, ALKPHOS, BILITOT, PROT, ALBUMIN,  in the last 168 hours No results found for this basename: LIPASE, AMYLASE,  in the last 168 hours No results found for this basename: AMMONIA,  in the last 168 hours CBC:  Recent Labs Lab 05/20/13 1830  05/21/13 0136 05/21/13 1239 05/21/13 1722 05/22/13 0449 05/23/13 0420 05/24/13 0439  WBC 8.3  --  6.8  --   --  6.4 6.1 6.6  NEUTROABS 6.2  --   --   --   --   --   --   --   HGB 10.0*  < > 10.7* 11.7* 11.6* 10.8* 10.7* 11.2*  HCT 29.5*  < > 31.7* 36.2* 35.4* 32.7* 32.1* 32.9*  MCV 89.1  --  88.8  --   --  89.8 89.7 88.2  PLT 111*  --  111*  --   --  105* 110* 139*  < > = values in this interval not displayed. Cardiac Enzymes: No results found for this basename: CKTOTAL, CKMB, CKMBINDEX, TROPONINI,  in the last 168 hours BNP: BNP (last 3 results)  Recent Labs  05/20/13 1517 05/21/13 1918 05/23/13 0420  PROBNP 425.6  1391.0* 696.8*   CBG: No results found for this basename: GLUCAP,  in the last 168 hours  Signed:  Hollice Espy  [facility] 05/25/2013, 8:56 PM

## 2013-05-27 ENCOUNTER — Encounter (HOSPITAL_COMMUNITY): Payer: Self-pay | Admitting: Internal Medicine

## 2013-05-27 NOTE — Progress Notes (Signed)
Refused offer of COPD gold card 

## 2013-06-17 ENCOUNTER — Other Ambulatory Visit: Payer: Self-pay | Admitting: Cardiology

## 2013-06-17 NOTE — Telephone Encounter (Signed)
Rx was sent to pharmacy electronically. 

## 2013-06-18 DIAGNOSIS — J449 Chronic obstructive pulmonary disease, unspecified: Secondary | ICD-10-CM | POA: Diagnosis not present

## 2013-06-18 DIAGNOSIS — F411 Generalized anxiety disorder: Secondary | ICD-10-CM | POA: Diagnosis not present

## 2013-07-01 ENCOUNTER — Telehealth: Payer: Self-pay | Admitting: *Deleted

## 2013-07-01 NOTE — Telephone Encounter (Signed)
Pt has been in and out of hospital, wants INR check. Appt set for 2/17

## 2013-07-01 NOTE — Telephone Encounter (Signed)
Pt was calling in to an appointment with you. I told him that I can do that for him, but he insisted on talking to you.  DH

## 2013-07-09 ENCOUNTER — Ambulatory Visit: Payer: Self-pay | Admitting: Pharmacist Clinician (PhC)/ Clinical Pharmacy Specialist

## 2013-07-16 ENCOUNTER — Ambulatory Visit: Payer: Medicare Other | Admitting: Pharmacist Clinician (PhC)/ Clinical Pharmacy Specialist

## 2013-07-23 ENCOUNTER — Ambulatory Visit (INDEPENDENT_AMBULATORY_CARE_PROVIDER_SITE_OTHER): Payer: Medicare Other | Admitting: Pharmacist Clinician (PhC)/ Clinical Pharmacy Specialist

## 2013-07-23 VITALS — BP 126/60 | HR 60

## 2013-07-23 DIAGNOSIS — Z7901 Long term (current) use of anticoagulants: Secondary | ICD-10-CM

## 2013-07-23 DIAGNOSIS — I4891 Unspecified atrial fibrillation: Secondary | ICD-10-CM

## 2013-07-23 LAB — POCT INR: INR: 2.4

## 2013-08-09 ENCOUNTER — Ambulatory Visit (INDEPENDENT_AMBULATORY_CARE_PROVIDER_SITE_OTHER): Payer: Medicare Other | Admitting: Ophthalmology

## 2013-08-19 ENCOUNTER — Ambulatory Visit (INDEPENDENT_AMBULATORY_CARE_PROVIDER_SITE_OTHER): Payer: Medicare Other | Admitting: Ophthalmology

## 2013-08-19 DIAGNOSIS — H43819 Vitreous degeneration, unspecified eye: Secondary | ICD-10-CM

## 2013-08-19 DIAGNOSIS — I10 Essential (primary) hypertension: Secondary | ICD-10-CM | POA: Diagnosis not present

## 2013-08-19 DIAGNOSIS — H348392 Tributary (branch) retinal vein occlusion, unspecified eye, stable: Secondary | ICD-10-CM

## 2013-08-19 DIAGNOSIS — H35039 Hypertensive retinopathy, unspecified eye: Secondary | ICD-10-CM | POA: Diagnosis not present

## 2013-08-19 DIAGNOSIS — H35379 Puckering of macula, unspecified eye: Secondary | ICD-10-CM

## 2013-08-19 DIAGNOSIS — H3530 Unspecified macular degeneration: Secondary | ICD-10-CM

## 2013-08-21 ENCOUNTER — Ambulatory Visit (INDEPENDENT_AMBULATORY_CARE_PROVIDER_SITE_OTHER): Payer: Medicare Other | Admitting: Pharmacist Clinician (PhC)/ Clinical Pharmacy Specialist

## 2013-08-21 VITALS — BP 118/64 | HR 84

## 2013-08-21 DIAGNOSIS — Z7901 Long term (current) use of anticoagulants: Secondary | ICD-10-CM

## 2013-08-21 DIAGNOSIS — I4891 Unspecified atrial fibrillation: Secondary | ICD-10-CM | POA: Diagnosis not present

## 2013-08-21 LAB — POCT INR: INR: 2.5

## 2013-09-10 ENCOUNTER — Ambulatory Visit (INDEPENDENT_AMBULATORY_CARE_PROVIDER_SITE_OTHER): Payer: Medicare Other | Admitting: Internal Medicine

## 2013-09-10 ENCOUNTER — Encounter: Payer: Self-pay | Admitting: Internal Medicine

## 2013-09-10 VITALS — BP 128/68 | HR 72 | Ht 72.0 in | Wt 230.4 lb

## 2013-09-10 DIAGNOSIS — J439 Emphysema, unspecified: Secondary | ICD-10-CM

## 2013-09-10 DIAGNOSIS — J438 Other emphysema: Secondary | ICD-10-CM | POA: Diagnosis not present

## 2013-09-10 MED ORDER — BUDESONIDE-FORMOTEROL FUMARATE 160-4.5 MCG/ACT IN AERO: 2.0000 | INHALATION_SPRAY | Freq: Two times a day (BID) | RESPIRATORY_TRACT | Status: DC

## 2013-09-10 NOTE — Patient Instructions (Addendum)
Continue Symbicort 160, 2 puffs then rinse mouth well, twice daily       Saamples x 2  Please call as needed

## 2013-09-10 NOTE — Assessment & Plan Note (Signed)
Significant chronic bronchitis component. He denies acute change, but Im not sure he pays attention. We reviewed CXR images back to 2012. He had remote RLL lobectomy for hamartoma. Watching R hilum and RML area, but don't think there is active change. Plan- Continue Symbicort, with samples to help today. Nebulizer when needed.

## 2013-09-10 NOTE — Progress Notes (Signed)
Patient ID: Roberto Reilly, male    DOB: 09/01/1933, 80 y.o.   MRN: [id number]  HPI 12/06/10- [age] yoM former smoker, followed for COPD, complicated by CAD, diastolic heart failure, PAFib/chronic anticoagulation, HBP, Obesity Last here January 11, 2010. - Note reviewed. He was hosp for COPD exacerbation, diastolic heart failure, chronic respiratory failure  5/29-10/22/10 , but says he is back to baseline now.  He blames hot humid weather currently for cough with chest rattle with clear to trace yellow sputum. He feels well controlled now. Denies smoking. Dr [name] added Symbicort ? Strength. It may be helping some. Using ventolin rescue inhaler 0-2x/day. Triggers- weather, indoors to outdoors. Continues Spiriva. He is wearing a heart monitor for Dr Clarene DukeLittle, potential need for pacemaker.   01/06/11-77 yoM former smoker, followed for COPD, complicated by CAD, diastolic heart failure, PAFib/chronic anticoagulation, HBP, Obesity Blames occasional bad day on "marital stress". Feels he can't leave wife long enough to have pacemaker placed.  Good and bad days with breathing are affected by the weather.  He had home O2 after hospital- did ONOX- desat < 88% for over an hour, meeting qualifying criteria for home O2 for sleep- discussed.  He again says he is not interested in having a sleep study and would not choose to treat sleep apnea if he found he had it.   09/01/11- 77 yoM former smoker, followed for COPD, complicated by CAD, diastolic heart failure, PAFib/chronic anticoagulation, HBP, Obesity, old right thoracotomy Wife fell and broke her right arm so he has to do the housework and help her which is very depressing and confining. Breathing has been stable except he has to use his nebulizer at night. Gets up repeatedly for wife and he is tired and worn out. Little cough with scant clear phlegm. Did not qualify for help with Spiriva cost. Our office is looking at that. He did qualify for Xopenex. Using Symbicort and  nebulizer with no new heart issues since last here.  03/08/12- 77 yoM former smoker, followed for COPD, complicated by CAD, diastolic heart failure, PAFib/chronic anticoagulation, HBP, Obesity, old right thoracotomy Increased SOB with activity; denies any wheezing, cough, or congestion. COPD Assessment Test (CAT) score 14/40 Can't afford Spiriva and doesn't qualify for assistance. Asks samples. Occasional cough with clear phlegm, no wheeze, chest pain or blood.  Uses O2 at night most nights. We discussed O2 therapy. CXR 09/01/11 IMPRESSION:  Again noted mild hyperinflation. Stable chronic blunting of the  right costophrenic angle and mild elevation of the right  hemidiaphragm. This may be due to pleural thickening, scarring or  small right pleural effusion. No acute infiltrate or pulmonary  edema. Question old right lower healed rib fractures.  Original Report Authenticated By: Natasha MeadLIVIU POP, M.D.   09/06/12- [age] yoM former smoker, followed for COPD, complicated by CAD, diastolic heart failure, PAFib/chronic anticoagulation, HBP, Obesity, old right thoracotomy FOLLOWS FOR: Breathing is unchanged. Reports DOE and a dry cough from time to time. Denies chest pain or chest tightness. He is still using a leftover Xopenex, but has prescription for albuterol. Hopefully that won't bother his atrial fibrillation.  03/12/13- 79 yoM former smoker, followed for COPD, complicated by CAD, diastolic heart failure, PAFib/chronic anticoagulation, HBP, Obesity, old right thoracotomy FOLLOWS FOR: has noticed he is needing his O2 more each day.  Wife is in and out of the hospital, requiring a lot of his energy and time. He blames low oxygen saturation on arrival today on coming across the parking lot and being  cold. Says he can't afford rescue inhaler. Cough is only productive after his nebulizer-white with no blood.  09/10/13- 79 yoM former smoker, followed for COPD, complicated by CAD, diastolic heart failure,  PAFib/chronic anticoagulation, HBP, Obesity, old right thoracotomy FOLLOWS FOR:sob with exertion,denies cough or wheeze,no cp or thightness,samples no difference since last ov,still using Symbicort Feels controlled. Reports no change. Preoccupied caring for wife. Stays in to avoid weather changes. Some sneezing. CXR 05/20/13 IMPRESSION:  1. Increased atelectasis in the right lower lobe medially.  2. Chronic blunting of the right costophrenic angle with remote  right rib fractures and osteotomies.  Electronically Signed  By: Herbie BaltimoreWalt Liebkemann M.D.  On: 05/20/2013 15:50   Review of Systems- see HPI Constitutional:   No-   weight loss, night sweats, fevers, chills, fatigue, lassitude. HEENT:   No-   headaches, difficulty swallowing, tooth/dental problems, sore throat,                  No-   sneezing, itching, ear ache,+ nasal congestion, post nasal drip,  CV:  No-   chest pain, orthopnea, PND, swelling in lower extremities, anasarca, dizziness, palpitations GI:  No-   heartburn, indigestion, abdominal pain, nausea, vomiting,  Resp: , per HPI, Chronic cough              No-  coughing up of blood.              No-   change in color of mucus.  +occasional wheezing.   Skin: No-   rash or lesions. GU: . MS:  No-   joint pain or swelling.   Psych:  + change in mood or affect. + depression or anxiety.  No memory loss.   Objective:   Physical Exam General- Alert, Oriented, Affect-appropriate, Distress- none acute   Overweight. Placed on O2 2L here Skin- rash-none, lesions- none, excoriation- none. + coumadin bruising Lymphadenopathy- none Head- atraumatic            Eyes- Gross vision intact, PERRLA, conjunctivae clear secretions            Ears- Hearing, canals            Nose- Clear, No-Septal dev, mucus, polyps, erosion, perforation             Throat- Mallampati II , mucosa clear , drainage- none, tonsils- atrophic, +dentures                            +hoarse Neck- flexible , trachea  midline, no stridor , thyroid nl, carotid no bruit Chest - symmetrical excursion , unlabored           Heart/CV- RRR( no pacer) , no murmur , no gallop  , no rub, nl s1 s2                           - JVD- none , edema- none, stasis changes- none, varices- none           Lung- +coarse rhonchi R>L , no- cough, dullness-none, rub- none   unlabored           Chest wall- sternal scar Abd-  Br/ Gen/ Rectal- Not done, not indicated Extrem- + external varices on legs- soft with negative Homans Neuro- grossly intact to observation

## 2013-09-12 DIAGNOSIS — Z79899 Other long term (current) drug therapy: Secondary | ICD-10-CM | POA: Diagnosis not present

## 2013-09-12 DIAGNOSIS — E7800 Pure hypercholesterolemia, unspecified: Secondary | ICD-10-CM | POA: Diagnosis not present

## 2013-09-17 DIAGNOSIS — I10 Essential (primary) hypertension: Secondary | ICD-10-CM | POA: Diagnosis not present

## 2013-09-17 DIAGNOSIS — I2510 Atherosclerotic heart disease of native coronary artery without angina pectoris: Secondary | ICD-10-CM | POA: Diagnosis not present

## 2013-09-17 DIAGNOSIS — Z1331 Encounter for screening for depression: Secondary | ICD-10-CM | POA: Diagnosis not present

## 2013-09-17 DIAGNOSIS — Z0000 Encounter for general adult medical examination without abnormal findings: Secondary | ICD-10-CM | POA: Diagnosis not present

## 2013-09-18 ENCOUNTER — Encounter: Payer: Self-pay | Admitting: Cardiology

## 2013-10-02 ENCOUNTER — Ambulatory Visit (INDEPENDENT_AMBULATORY_CARE_PROVIDER_SITE_OTHER): Payer: Medicare Other | Admitting: Pharmacist Clinician (PhC)/ Clinical Pharmacy Specialist

## 2013-10-02 DIAGNOSIS — Z7901 Long term (current) use of anticoagulants: Secondary | ICD-10-CM

## 2013-10-02 DIAGNOSIS — I4891 Unspecified atrial fibrillation: Secondary | ICD-10-CM | POA: Diagnosis not present

## 2013-10-02 LAB — POCT INR: INR: 2

## 2013-10-07 ENCOUNTER — Other Ambulatory Visit: Payer: Self-pay | Admitting: Pharmacist Clinician (PhC)/ Clinical Pharmacy Specialist

## 2013-11-13 ENCOUNTER — Ambulatory Visit (INDEPENDENT_AMBULATORY_CARE_PROVIDER_SITE_OTHER): Payer: Medicare Other | Admitting: Pharmacist Clinician (PhC)/ Clinical Pharmacy Specialist

## 2013-11-13 DIAGNOSIS — Z7901 Long term (current) use of anticoagulants: Secondary | ICD-10-CM | POA: Diagnosis not present

## 2013-11-13 DIAGNOSIS — I4891 Unspecified atrial fibrillation: Secondary | ICD-10-CM

## 2013-11-13 LAB — POCT INR: INR: 2.4

## 2013-11-18 ENCOUNTER — Other Ambulatory Visit: Payer: Self-pay | Admitting: Cardiology

## 2013-12-16 ENCOUNTER — Other Ambulatory Visit: Payer: Self-pay | Admitting: Pharmacist Clinician (PhC)/ Clinical Pharmacy Specialist

## 2013-12-16 MED ORDER — WARFARIN SODIUM 2.5 MG PO TABS: ORAL_TABLET | ORAL | Status: DC

## 2013-12-24 ENCOUNTER — Ambulatory Visit: Payer: Self-pay | Admitting: Pharmacist Clinician (PhC)/ Clinical Pharmacy Specialist

## 2013-12-25 ENCOUNTER — Ambulatory Visit (INDEPENDENT_AMBULATORY_CARE_PROVIDER_SITE_OTHER): Payer: Medicare Other | Admitting: Pharmacist Clinician (PhC)/ Clinical Pharmacy Specialist

## 2013-12-25 DIAGNOSIS — Z7901 Long term (current) use of anticoagulants: Secondary | ICD-10-CM | POA: Diagnosis not present

## 2013-12-25 LAB — POCT INR: INR: 2.1

## 2014-01-06 ENCOUNTER — Ambulatory Visit (INDEPENDENT_AMBULATORY_CARE_PROVIDER_SITE_OTHER): Payer: Medicare Other | Admitting: Cardiology

## 2014-01-06 ENCOUNTER — Other Ambulatory Visit: Payer: Self-pay | Admitting: Cardiology

## 2014-01-06 VITALS — BP 142/64 | HR 76 | Ht 72.0 in | Wt 235.5 lb

## 2014-01-06 DIAGNOSIS — I714 Abdominal aortic aneurysm, without rupture, unspecified: Secondary | ICD-10-CM

## 2014-01-06 DIAGNOSIS — Z951 Presence of aortocoronary bypass graft: Secondary | ICD-10-CM

## 2014-01-06 DIAGNOSIS — I10 Essential (primary) hypertension: Secondary | ICD-10-CM | POA: Diagnosis not present

## 2014-01-06 DIAGNOSIS — E785 Hyperlipidemia, unspecified: Secondary | ICD-10-CM

## 2014-01-06 DIAGNOSIS — I4891 Unspecified atrial fibrillation: Secondary | ICD-10-CM | POA: Diagnosis not present

## 2014-01-06 DIAGNOSIS — J3489 Other specified disorders of nose and nasal sinuses: Secondary | ICD-10-CM

## 2014-01-06 DIAGNOSIS — I482 Chronic atrial fibrillation, unspecified: Secondary | ICD-10-CM

## 2014-01-06 DIAGNOSIS — J438 Other emphysema: Secondary | ICD-10-CM

## 2014-01-06 DIAGNOSIS — R0981 Nasal congestion: Secondary | ICD-10-CM

## 2014-01-06 DIAGNOSIS — Z79899 Other long term (current) drug therapy: Secondary | ICD-10-CM

## 2014-01-06 DIAGNOSIS — I2510 Atherosclerotic heart disease of native coronary artery without angina pectoris: Secondary | ICD-10-CM | POA: Diagnosis not present

## 2014-01-06 DIAGNOSIS — Z7901 Long term (current) use of anticoagulants: Secondary | ICD-10-CM

## 2014-01-06 DIAGNOSIS — J439 Emphysema, unspecified: Secondary | ICD-10-CM

## 2014-01-06 DIAGNOSIS — E669 Obesity, unspecified: Secondary | ICD-10-CM

## 2014-01-06 NOTE — Patient Instructions (Signed)
CAN BUY GENERIC -ALLEGRA- CONGESTION  LABS BMP  CAN CONTINUE NOT TAKING SOTALOL  Your physician wants you to follow-up in 12 MONTH DR HARDING. You will receive a reminder letter in the mail two months in advance. If you don't receive a letter, please call our office to schedule the follow-up appointment.

## 2014-01-07 ENCOUNTER — Encounter: Payer: Self-pay | Admitting: Cardiology

## 2014-01-07 ENCOUNTER — Other Ambulatory Visit: Payer: Self-pay | Admitting: *Deleted

## 2014-01-07 DIAGNOSIS — R0981 Nasal congestion: Secondary | ICD-10-CM | POA: Insufficient documentation

## 2014-01-07 DIAGNOSIS — Z79899 Other long term (current) drug therapy: Secondary | ICD-10-CM | POA: Insufficient documentation

## 2014-01-07 MED ORDER — POTASSIUM CHLORIDE CRYS ER 20 MEQ PO TBCR: 20.0000 meq | EXTENDED_RELEASE_TABLET | Freq: Every day | ORAL | Status: DC

## 2014-01-07 NOTE — Assessment & Plan Note (Signed)
With rhinorrhea and red eyes with congestion, the sounds more consistent with seasonal allergy type symptoms. I recommended OTC loratadine.

## 2014-01-07 NOTE — Assessment & Plan Note (Signed)
This was noted by CT scan in December 2014. It would be prudent to recheck CT scan this December to ensure no change in size. Annual followup evaluation with either ultrasound or CT scan within the warranted. I will defer this to his this to PCP.

## 2014-01-07 NOTE — Assessment & Plan Note (Addendum)
Borderline elevated blood pressure today. He is on amlodipine at max dose. I think if his blood pressure remained in this range, would consider ARB  He is asking about the need to continue with his potassium. This is probably disease on furosemide. I will check a BMP to see if his potassium is okay, and if so we can stop his potassium supplement

## 2014-01-07 NOTE — Assessment & Plan Note (Signed)
Rate-controlled A. fib without beta blocker. His sotalol was somehow stopped in the preceding year. With him being in sinus rhythm and rate controlled I am fine with him being off of sotalol for now. I would like him to be on something beta blocker, but will hold off basilar significant COPD

## 2014-01-07 NOTE — Progress Notes (Signed)
Patient ID: Roberto Reilly, male   DOB: 04-09-1934, 80 y.o.   MRN: [id number] PCP: Juline Patch, MD  Clinic Note: Chief Complaint  Patient presents with  . Annual Exam    NO CHEST PAIN, SOB - CONGESTION "RATTLING",LIITLE EDEMA -LEFT LEG FROM CABG ;   LABS IN 4 /2015    HPI: Roberto Reilly is a 80 y.o. male with a PMH below who presents today for a one-year followup. As you recall he is another very pleasant gentleman, who is a former patient of Dr. Caprice Kluver. He has a history of CABG in 2007 following catheterization catheterization for dyspnea on exertion. He also has long-standing intermittently oxygen requiring COPD.  Interval History: He presents today, feeling relatively well from a cardiac standpoint. He has his baseline exertional dyspnea but relatively stable. No chest tightness or pressure with rest or exertion. No resting dyspnea more than usual. He still has mild, stable or extremity edema but is not in that much while on the current dose of Lasix. He denies any symptoms of PND or orthopnea. He denies any sensation of palpitations or any knowledgeable about his atrial fibrillation or not. He denies any TIA or amaurosis fugax symptoms or claudication symptoms. No melena, hematochezia or hematuria but he does occasionally notice some blood when he wipes. He did have an episode in the fall of possible GI bleed nausea and vomiting. He is evaluated with colonoscopy. After a short period he was restarted on warfarin.  He readily admits that he has not been as good with his dietary modification and has gained back a lot of weight he lost. With his wife not doing as well, he has not been doing as much walking either.  The most notable symptom is feeling now is that he is having some upper respiratory tract congestion with nasal congestion and runny nose with red eyes. These nursing more lateral in his breathing but does not said it is more short of breath and he has been at baseline. Is not  necessary and use more of his inhalers than before.  Past Medical History  Diagnosis Date  . History of non-ST Elevation MI (myocardial infarction) March 2007    Admitted with COPD exacerbation, given by CHF. Had some chest pain with positive troponins. Cath showed multivessel disease, referred for CABG  . CAD in native artery March 2007    Cath for dyspnea on exertion: 75% distal LM, 85% RI, 95% mid-distal Cx, in multiple RCA lesions with 95% distal. --> Referred for CABG; has declined further noninvasive evaluation in the absence of worsening symptoms  . S/P CABG x 24 July 2005    LIMA-LAD, SVG-OM, seq SVG-PDA -PLB  . Atrial fibrillation, chronic     Anticoagulated on warfarin. Stable -- post op  . H/O echocardiogram March ; January 2015    a) Normal LV size and cavity appeared normal function. Grade 1 diastolic dysfunction. Limited study.; b) normal LV size and function with EF 60-65%. Aortic sclerosis without stenosis, mildly increased PA pressures of but normal IVC and RA/RV size  . Obesity (BMI 30-39.9)     One year ago weighed 265 pounds, (12/2012) -- now 234 pounds  . Dyslipidemia, goal LDL below 70   . COPD (chronic obstructive pulmonary disease) with emphysema 05/23/2007    Hosp 5/29-6/01/12- COPD exacerbation ONOX 12/08/10- desaturated to less than 88% for over an hour, qualifying for home O2 during sleep    . Hypoxia  chronic, on home O2  . History of pneumonia   . Seasonal allergies   . Diverticulosis of colon with hemorrhage 05/24/2013    Prior Cardiac Evaluation and Past Surgical History: Past Surgical History  Procedure Laterality Date  . Cataract extraction      Laser  . Rll resection for hamartoma    . Rul for hamartoma    . Coronary artery bypass graft    . Cardiac catheterization  03 28 2007    NORMAL LV FUNCTION/ ABDOMINAL AORTA STENOSIS,75%-85%. RIGHT FEMORAL ARTERY :CATHETERS USED A  4-FRENCH WITH A 4-FRENCH SHEATH  . Colonoscopy N/A 05/24/2013     Procedure: COLONOSCOPY;  Surgeon: Iva Boop, MD;  Location: [facility];  Service: Endoscopy;  Laterality: N/A;   Allergies  Allergen Reactions  . Prednisone     Makes pt jittery and nervous    Current Outpatient Prescriptions  Medication Sig Dispense Refill  . amLODipine (NORVASC) 10 MG tablet Take 1 tablet (10 mg total) by mouth daily.  30 tablet  1  . aspirin 81 MG tablet Take 81 mg by mouth daily.        . budesonide-formoterol (SYMBICORT) 160-4.5 MCG/ACT inhaler Inhale 2 puffs into the lungs 2 (two) times daily.  3 Inhaler  3  . cholecalciferol (VITAMIN D) 1000 UNITS tablet Take 1,000 Units by mouth daily.      . clonazePAM (KLONOPIN) 0.5 MG tablet Take 0.5 mg by mouth 3 (three) times daily as needed for anxiety.       . docusate sodium 100 MG CAPS Take 100 mg by mouth 2 (two) times daily.  10 capsule  0  . furosemide (LASIX) 40 MG tablet TAKE ONE TABLET BY MOUTH EVERY MORNING  90 tablet  0  . ipratropium-albuterol (DUONEB) 0.5-2.5 (3) MG/3ML SOLN Take 3 mLs by nebulization every 6 (six) hours as needed (wheezing and shortness of breath).       . sertraline (ZOLOFT) 50 MG tablet Take 50 mg by mouth daily.      . simvastatin (ZOCOR) 20 MG tablet Take 20 mg by mouth daily.      Marland Kitchen warfarin (COUMADIN) 2.5 MG tablet Take 1-2 tablets by mouth daily as directed by coumadin clinic  45 tablet  5  . zolpidem (AMBIEN) 10 MG tablet Take 5 mg by mouth at bedtime.       . Potassium Chloride ER 20 MEQ TBCR TAKE ONE TABLET BY MOUTH EVERY MORNING  30 tablet  11  . potassium chloride SA (K-DUR,KLOR-CON) 20 MEQ tablet Take 1 tablet (20 mEq total) by mouth daily.  30 tablet  11   No current facility-administered medications for this visit.    History   Social History Narrative   He is a married father of 2, grandfather of 60, does not get routine exercise. He is a former smoker, but does not currently or not drink alcohol.    He is modifying his diet and trying to get activity but is doing  better with the diet than the activity.    He is under a lot of stress. His wife who has been pretty much the caregiver for has been dealing with TIAs and other kind of issues that have been quite stressful for her. She also has back problems,   And coronary disease, uses a rolling walker and now has had a TIA and has been in and out of the hospital, and it seems to be almost overwhelming for him.  ROS: A comprehensive Review of Systems - Negative except Pertinent positives above. Other noncardiac issues below. General: As put back on weight the 2 lateral dietary moderation. ENT: Significant sinus congestion with red eyes and drainage. Pulmonary: Chronic morning cough, uses oxygen regularly at night but not constantly. Occasionally will use oxygen when going out for long excursions, but usually is not wearing it at baseline. Gastrointestinal ROS: positive for - constipation and gas/bloating negative for - blood in stools recently, but did have an episode in the fall her hospitalization; no diarrhea, melena, nausea/vomiting or stool incontinence Dermatological ROS: positive for skin lesion changes and The left leg being somewhat red and discolored.  PHYSICAL EXAM BP 142/64  Pulse 76  Ht 6' (1.829 m)  Wt 235 lb 8 oz (106.822 kg)  BMI 31.93 kg/m2 Wt Readings from Last 3 Encounters:  01/06/14 235 lb 8 oz (106.822 kg)  09/10/13 230 lb 6.4 oz (104.509 kg)  05/25/13 211 lb 3.2 oz (95.8 kg)    General appearance: alert, cooperative, appears stated age, no distress, moderately obese and otherwise healthy appearing. Answers questions appropriately. Pleasant mood and affect. Loud breathing expiratory wheeze.  He sits in a tripod position, but does not seem to be in distress to  Neck: no adenopathy, no carotid bruit, supple, symmetrical, trachea midline and unable to see jugular veins due to body habitus.  Lungs: Prolonged expiration phase almost 2-1. Diffuse interstitial sounds with mostly x-ray  wheezing/rhonchi throughout. Most of the "rattling sounds are upper respiratory in nature as opposed to peripheral ". Hyperexpanded but no dullness to percussion.  Heart: RRR, S1, S2 normal, S3 present and No rubs or gallops noted. Very distant heart sounds. Unable to palpate PMI  Abdomen: soft, non-tender; bowel sounds normal; no masses,  no organomegaly and Still has moderate truncal obesity although his overall weight puts him below this category.  Extremities: edema 1-2+ on the left and 1+ in the right, varicose veins noted and venous stasis dermatitis noted; Pulses: 2+ and symmetric  Skin: hyperpigmentation - lower leg(s) Bilaterally left greater than right, varicosities - lower leg(s) bilateral and Also noted are telangiectasias/spider veins  Neurologic: Grossly normal  [id number] today: Yes Rate: 76 , Rhythm: NSR, RBBB, otherwise normal  Recent Labs: None available today. Last lipid panel in 2014 and LDL 121 total cholesterol 811. HDL was 43. No recent check.  ASSESSMENT / PLAN: CAD in native artery - LM, RCA, RI & Cx disease --> CABG x 4 (LIMA-LAD, SVG-OM/RI, SVG-rPDA-rPL) Stable from a cardiac standpoint with no active anginal symptoms. He has baseline dyspnea from COPD. His current change in breathing pattern is probably more related to allergies versus upper respiratory tract infection.  He has been reluctant to have any invasive or noninvasive evaluation in the past. He did have an echocardiogram done in January for symptoms as documented his acute on chronic diastolic heart failure, but his echo really did not show any signs of that. I am certain he has some diastolic dysfunction, and he is on standing dose of Lasix for his mild edema. Edema is probably more related to elevated pulmonary pressures however.  When. Continue with current management using aspirin, statin and amlodipine. Not on ACE inhibitor or beta blocker due to pulmonary disease. Somehow recently stopped sotalol.  Could consider ARB if BP remains elevated.  Atrial fibrillation, chronic / persistent Rate-controlled A. fib without beta blocker. His sotalol was somehow stopped in the preceding year. With him being in sinus rhythm and rate controlled  I am fine with him being off of sotalol for now. I would like him to be on something beta blocker, but will hold off basilar significant COPD  AAA (abdominal aortic aneurysm) without rupture seen on CT scan This was noted by CT scan in December 2014. It would be prudent to recheck CT scan this December to ensure no change in size. Annual followup evaluation with either ultrasound or CT scan within the warranted. I will defer this to his this to PCP.  Essential hypertension Borderline elevated blood pressure today. He is on amlodipine at max dose. I think if his blood pressure remained in this range, would consider ARB  He is asking about the need to continue with his potassium. This is probably disease on furosemide. I will check a BMP to see if his potassium is okay, and if so we can stop his potassium supplement   Dyslipidemia, goal LDL below 70 Not adequately controlled. He is on simvastatin and monitored by his PCP. My recommendation would be to change to a more potent statin such as atorvastatin or rosuvastatin or versus adding Zetia. His total cholesterol was 200 the LDL is still high.  COPD (chronic obstructive pulmonary disease) with emphysema Followup with Dr. Maple HudsonYoung from pulmonary medicine.  Nasal sinus congestion With rhinorrhea and red eyes with congestion, the sounds more consistent with seasonal allergy type symptoms. I recommended OTC loratadine.  Long term (current) use of anticoagulants On warfarin, with INR followed in our office.   Obesity (BMI 30-39.9) Unfortunately, he put back on a lot of weight that he lost before. He is now 24 pounds up from January, but essentially stable compared to last visit.   Orders Placed This Encounter    Procedures  . Basic metabolic panel  . EKG 12-Lead   Followup: 12 months, unless symptoms worsen.  Note PCP: Will need lipid panel follow-up - with adjustment of statin therapy as well as CT scan of abdomen to followup AAA.   Marykay LexHARDING,Shironda Kain W, M.D., M.S. Interventional Cardiologist   Pager # [phone] 01/07/2014

## 2014-01-07 NOTE — Assessment & Plan Note (Signed)
On warfarin, with INR followed in our office.

## 2014-01-07 NOTE — Assessment & Plan Note (Addendum)
Stable from a cardiac standpoint with no active anginal symptoms. He has baseline dyspnea from COPD. His current change in breathing pattern is probably more related to allergies versus upper respiratory tract infection.  He has been reluctant to have any invasive or noninvasive evaluation in the past. He did have an echocardiogram done in January for symptoms as documented his acute on chronic diastolic heart failure, but his echo really did not show any signs of that. I am certain he has some diastolic dysfunction, and he is on standing dose of Lasix for his mild edema. Edema is probably more related to elevated pulmonary pressures however.  When. Continue with current management using aspirin, statin and amlodipine. Not on ACE inhibitor or beta blocker due to pulmonary disease. Somehow recently stopped sotalol. Could consider ARB if BP remains elevated.

## 2014-01-07 NOTE — Assessment & Plan Note (Signed)
Unfortunately, he put back on a lot of weight that he lost before. He is now 24 pounds up from January, but essentially stable compared to last visit.

## 2014-01-07 NOTE — Assessment & Plan Note (Signed)
Followup with Dr. Maple HudsonYoung from pulmonary medicine.

## 2014-01-07 NOTE — Assessment & Plan Note (Signed)
Not adequately controlled. He is on simvastatin and monitored by his PCP. My recommendation would be to change to a more potent statin such as atorvastatin or rosuvastatin or versus adding Zetia. His total cholesterol was 200 the LDL is still high.

## 2014-01-09 ENCOUNTER — Telehealth: Payer: Self-pay | Admitting: Internal Medicine

## 2014-01-09 NOTE — Telephone Encounter (Signed)
Called and spoke to pt. Pt stated he is needed Symbicort 160 samples. Pt stated he cannot afford the Symbicort when picking it up from the pharmacy. Informed the pt that 2 samples were left up front and that I placed pt assistance forms in the bag so he can fill it out and then bring/ send it back. Pt verbalized understanding and denied any further questions or concerns at this time.

## 2014-02-05 ENCOUNTER — Ambulatory Visit (INDEPENDENT_AMBULATORY_CARE_PROVIDER_SITE_OTHER): Payer: Medicare Other | Admitting: Pharmacist Clinician (PhC)/ Clinical Pharmacy Specialist

## 2014-02-05 VITALS — BP 130/64 | HR 72

## 2014-02-05 DIAGNOSIS — I4891 Unspecified atrial fibrillation: Secondary | ICD-10-CM | POA: Diagnosis not present

## 2014-02-05 DIAGNOSIS — Z7901 Long term (current) use of anticoagulants: Secondary | ICD-10-CM | POA: Diagnosis not present

## 2014-02-05 LAB — POCT INR: INR: 2

## 2014-02-12 ENCOUNTER — Other Ambulatory Visit: Payer: Self-pay | Admitting: Internal Medicine

## 2014-02-12 MED ORDER — BUDESONIDE-FORMOTEROL FUMARATE 160-4.5 MCG/ACT IN AERO: 2.0000 | INHALATION_SPRAY | Freq: Two times a day (BID) | RESPIRATORY_TRACT | Status: DC

## 2014-02-17 ENCOUNTER — Other Ambulatory Visit: Payer: Self-pay | Admitting: Cardiology

## 2014-02-17 NOTE — Telephone Encounter (Signed)
Rx was sent to pharmacy electronically. 

## 2014-02-19 DIAGNOSIS — Z23 Encounter for immunization: Secondary | ICD-10-CM | POA: Diagnosis not present

## 2014-03-11 ENCOUNTER — Ambulatory Visit: Payer: Medicare Other | Admitting: Internal Medicine

## 2014-03-11 ENCOUNTER — Encounter: Payer: Self-pay | Admitting: Internal Medicine

## 2014-03-11 VITALS — BP 116/60 | HR 66 | Ht 72.0 in | Wt 238.6 lb

## 2014-03-11 DIAGNOSIS — J439 Emphysema, unspecified: Secondary | ICD-10-CM

## 2014-03-11 MED ORDER — BUDESONIDE-FORMOTEROL FUMARATE 160-4.5 MCG/ACT IN AERO: 2.0000 | INHALATION_SPRAY | Freq: Two times a day (BID) | RESPIRATORY_TRACT | Status: DC

## 2014-03-11 NOTE — Patient Instructions (Signed)
Sample x 2 Symbicort 160  2 puffs then rinse mouth, twice daily

## 2014-03-11 NOTE — Progress Notes (Signed)
Patient ID: Roberto Reilly, male    DOB: 09/01/1933, 80 y.o.   MRN: [id number]  HPI 12/06/10- [age] yoM former smoker, followed for COPD, complicated by CAD, diastolic heart failure, PAFib/chronic anticoagulation, HBP, Obesity Last here January 11, 2010. - Note reviewed. He was hosp for COPD exacerbation, diastolic heart failure, chronic respiratory failure  5/29-10/22/10 , but says he is back to baseline now.  He blames hot humid weather currently for cough with chest rattle with clear to trace yellow sputum. He feels well controlled now. Denies smoking. Dr [name] added Symbicort ? Strength. It may be helping some. Using ventolin rescue inhaler 0-2x/day. Triggers- weather, indoors to outdoors. Continues Spiriva. He is wearing a heart monitor for Dr Clarene DukeLittle, potential need for pacemaker.   01/06/11-77 yoM former smoker, followed for COPD, complicated by CAD, diastolic heart failure, PAFib/chronic anticoagulation, HBP, Obesity Blames occasional bad day on "marital stress". Feels he can't leave wife long enough to have pacemaker placed.  Good and bad days with breathing are affected by the weather.  He had home O2 after hospital- did ONOX- desat < 88% for over an hour, meeting qualifying criteria for home O2 for sleep- discussed.  He again says he is not interested in having a sleep study and would not choose to treat sleep apnea if he found he had it.   09/01/11- 77 yoM former smoker, followed for COPD, complicated by CAD, diastolic heart failure, PAFib/chronic anticoagulation, HBP, Obesity, old right thoracotomy Wife fell and broke her right arm so he has to do the housework and help her which is very depressing and confining. Breathing has been stable except he has to use his nebulizer at night. Gets up repeatedly for wife and he is tired and worn out. Little cough with scant clear phlegm. Did not qualify for help with Spiriva cost. Our office is looking at that. He did qualify for Xopenex. Using Symbicort and  nebulizer with no new heart issues since last here.  03/08/12- 77 yoM former smoker, followed for COPD, complicated by CAD, diastolic heart failure, PAFib/chronic anticoagulation, HBP, Obesity, old right thoracotomy Increased SOB with activity; denies any wheezing, cough, or congestion. COPD Assessment Test (CAT) score 14/40 Can't afford Spiriva and doesn't qualify for assistance. Asks samples. Occasional cough with clear phlegm, no wheeze, chest pain or blood.  Uses O2 at night most nights. We discussed O2 therapy. CXR 09/01/11 IMPRESSION:  Again noted mild hyperinflation. Stable chronic blunting of the  right costophrenic angle and mild elevation of the right  hemidiaphragm. This may be due to pleural thickening, scarring or  small right pleural effusion. No acute infiltrate or pulmonary  edema. Question old right lower healed rib fractures.  Original Report Authenticated By: Natasha MeadLIVIU POP, M.D.   09/06/12- [age] yoM former smoker, followed for COPD, complicated by CAD, diastolic heart failure, PAFib/chronic anticoagulation, HBP, Obesity, old right thoracotomy FOLLOWS FOR: Breathing is unchanged. Reports DOE and a dry cough from time to time. Denies chest pain or chest tightness. He is still using a leftover Xopenex, but has prescription for albuterol. Hopefully that won't bother his atrial fibrillation.  03/12/13- 79 yoM former smoker, followed for COPD, complicated by CAD, diastolic heart failure, PAFib/chronic anticoagulation, HBP, Obesity, old right thoracotomy FOLLOWS FOR: has noticed he is needing his O2 more each day.  Wife is in and out of the hospital, requiring a lot of his energy and time. He blames low oxygen saturation on arrival today on coming across the parking lot and being  cold. Says he can't afford rescue inhaler. Cough is only productive after his nebulizer-white with no blood.  09/10/13- 79 yoM former smoker, followed for COPD, complicated by CAD, diastolic heart failure,  PAFib/chronic anticoagulation, HBP, Obesity, old right thoracotomy FOLLOWS FOR:sob with exertion,denies cough or wheeze,no cp or thightness,samples no difference since last ov,still using Symbicort Feels controlled. Reports no change. Preoccupied caring for wife. Stays in to avoid weather changes. Some sneezing. CXR 05/20/13 IMPRESSION:  1. Increased atelectasis in the right lower lobe medially.  2. Chronic blunting of the right costophrenic angle with remote  right rib fractures and osteotomies.  Electronically Signed  By: Herbie BaltimoreWalt Liebkemann M.D.  On: 05/20/2013 15:50  03/11/14- 80 yoM former smoker, followed for COPD, complicated by CAD, diastolic heart failure, PAFib/chronic anticoagulation, HBP, Obesity, old right thoracotomy FOLLOW FOR: COPD SOB with exertion, wheeze no cough, nor chest tightness.. Still using Symbicort     Review of Systems- see HPI Constitutional:   No-   weight loss, night sweats, fevers, chills, fatigue, lassitude. HEENT:   No-   headaches, difficulty swallowing, tooth/dental problems, sore throat,                  No-   sneezing, itching, ear ache,+ nasal congestion, post nasal drip,  CV:  No-   chest pain, orthopnea, PND, swelling in lower extremities, anasarca, dizziness, palpitations GI:  No-   heartburn, indigestion, abdominal pain, nausea, vomiting,  Resp: , per HPI, Chronic cough              No-  coughing up of blood.              No-   change in color of mucus.  +occasional wheezing.   Skin: No-   rash or lesions. GU: . MS:  No-   joint pain or swelling.   Psych:  + change in mood or affect. + depression or anxiety.  No memory loss.   Objective:   Physical Exam General- Alert, Oriented, Affect-appropriate, Distress- none acute   Overweight. Placed on O2 2L here Skin- rash-none, lesions- none, excoriation- none. + coumadin bruising Lymphadenopathy- none Head- atraumatic            Eyes- Gross vision intact, PERRLA, conjunctivae clear secretions             Ears- Hearing, canals            Nose- Clear, No-Septal dev, mucus, polyps, erosion, perforation             Throat- Mallampati II , mucosa clear , drainage- none, tonsils- atrophic, +dentures                            +hoarse Neck- flexible , trachea midline, no stridor , thyroid nl, carotid no bruit Chest - symmetrical excursion , unlabored           Heart/CV- RRR( no pacer) , no murmur , no gallop  , no rub, nl s1 s2                           - JVD- none , edema- none, stasis changes- none, varices- none           Lung- +coarse rhonchi R>L , no- cough, dullness-none, rub- none   unlabored           Chest wall- sternal scar Abd-  Br/ Gen/ Rectal- Not  done, not indicated Extrem- + external varices on legs- soft with negative Homans Neuro- grossly intact to observation

## 2014-03-13 DIAGNOSIS — I10 Essential (primary) hypertension: Secondary | ICD-10-CM | POA: Diagnosis not present

## 2014-03-13 DIAGNOSIS — I2510 Atherosclerotic heart disease of native coronary artery without angina pectoris: Secondary | ICD-10-CM | POA: Diagnosis not present

## 2014-03-13 DIAGNOSIS — E559 Vitamin D deficiency, unspecified: Secondary | ICD-10-CM | POA: Diagnosis not present

## 2014-03-13 DIAGNOSIS — E785 Hyperlipidemia, unspecified: Secondary | ICD-10-CM | POA: Diagnosis not present

## 2014-03-18 DIAGNOSIS — J449 Chronic obstructive pulmonary disease, unspecified: Secondary | ICD-10-CM | POA: Diagnosis not present

## 2014-03-18 DIAGNOSIS — F5101 Primary insomnia: Secondary | ICD-10-CM | POA: Diagnosis not present

## 2014-03-18 DIAGNOSIS — Z23 Encounter for immunization: Secondary | ICD-10-CM | POA: Diagnosis not present

## 2014-03-18 DIAGNOSIS — F411 Generalized anxiety disorder: Secondary | ICD-10-CM | POA: Diagnosis not present

## 2014-03-18 DIAGNOSIS — E780 Pure hypercholesterolemia: Secondary | ICD-10-CM | POA: Diagnosis not present

## 2014-03-19 ENCOUNTER — Ambulatory Visit (INDEPENDENT_AMBULATORY_CARE_PROVIDER_SITE_OTHER): Payer: Medicare Other | Admitting: Pharmacist Clinician (PhC)/ Clinical Pharmacy Specialist

## 2014-03-19 DIAGNOSIS — Z7901 Long term (current) use of anticoagulants: Secondary | ICD-10-CM

## 2014-03-19 DIAGNOSIS — I4891 Unspecified atrial fibrillation: Secondary | ICD-10-CM

## 2014-03-19 LAB — POCT INR: INR: 1.8

## 2014-04-09 ENCOUNTER — Ambulatory Visit (INDEPENDENT_AMBULATORY_CARE_PROVIDER_SITE_OTHER): Payer: Medicare Other | Admitting: Pharmacist Clinician (PhC)/ Clinical Pharmacy Specialist

## 2014-04-09 ENCOUNTER — Ambulatory Visit: Payer: Self-pay | Admitting: Pharmacist Clinician (PhC)/ Clinical Pharmacy Specialist

## 2014-04-09 DIAGNOSIS — Z7901 Long term (current) use of anticoagulants: Secondary | ICD-10-CM | POA: Diagnosis not present

## 2014-04-09 DIAGNOSIS — I4891 Unspecified atrial fibrillation: Secondary | ICD-10-CM | POA: Diagnosis not present

## 2014-04-09 LAB — POCT INR: INR: 1.8

## 2014-04-30 ENCOUNTER — Ambulatory Visit (INDEPENDENT_AMBULATORY_CARE_PROVIDER_SITE_OTHER): Payer: Medicare Other | Admitting: Pharmacist Clinician (PhC)/ Clinical Pharmacy Specialist

## 2014-04-30 DIAGNOSIS — I4891 Unspecified atrial fibrillation: Secondary | ICD-10-CM | POA: Diagnosis not present

## 2014-04-30 DIAGNOSIS — Z7901 Long term (current) use of anticoagulants: Secondary | ICD-10-CM

## 2014-04-30 LAB — POCT INR: INR: 2

## 2014-05-19 ENCOUNTER — Other Ambulatory Visit: Payer: Self-pay | Admitting: Cardiology

## 2014-05-19 NOTE — Telephone Encounter (Signed)
Rx refill sent to patient pharmacy   

## 2014-05-28 ENCOUNTER — Ambulatory Visit (INDEPENDENT_AMBULATORY_CARE_PROVIDER_SITE_OTHER): Payer: Medicare Other | Admitting: Pharmacist Clinician (PhC)/ Clinical Pharmacy Specialist

## 2014-05-28 DIAGNOSIS — I4891 Unspecified atrial fibrillation: Secondary | ICD-10-CM | POA: Diagnosis not present

## 2014-05-28 DIAGNOSIS — Z7901 Long term (current) use of anticoagulants: Secondary | ICD-10-CM | POA: Diagnosis not present

## 2014-05-28 LAB — POCT INR: INR: 2.1

## 2014-06-02 ENCOUNTER — Other Ambulatory Visit: Payer: Self-pay | Admitting: Cardiology

## 2014-06-30 ENCOUNTER — Other Ambulatory Visit: Payer: Self-pay | Admitting: Cardiology

## 2014-07-09 ENCOUNTER — Telehealth: Payer: Self-pay | Admitting: Pharmacist Clinician (PhC)/ Clinical Pharmacy Specialist

## 2014-07-09 ENCOUNTER — Ambulatory Visit (INDEPENDENT_AMBULATORY_CARE_PROVIDER_SITE_OTHER): Payer: Medicare Other | Admitting: Pharmacist Clinician (PhC)/ Clinical Pharmacy Specialist

## 2014-07-09 DIAGNOSIS — Z7901 Long term (current) use of anticoagulants: Secondary | ICD-10-CM | POA: Diagnosis not present

## 2014-07-09 DIAGNOSIS — I4891 Unspecified atrial fibrillation: Secondary | ICD-10-CM | POA: Diagnosis not present

## 2014-07-09 LAB — POCT INR: INR: 2

## 2014-07-09 NOTE — Telephone Encounter (Signed)
Pt in for INR check today, noting that has had increased swelling in feet/ankles over past several days.  Advised pt to increase furosemide to 1.5 tablets (60 mg) x 2 days, then resume 40 mg daily.  If no improvement, he can call to RN for further instructions.

## 2014-08-11 ENCOUNTER — Ambulatory Visit (INDEPENDENT_AMBULATORY_CARE_PROVIDER_SITE_OTHER): Payer: Medicare Other | Admitting: Pharmacist Clinician (PhC)/ Clinical Pharmacy Specialist

## 2014-08-11 DIAGNOSIS — I4891 Unspecified atrial fibrillation: Secondary | ICD-10-CM | POA: Diagnosis not present

## 2014-08-11 DIAGNOSIS — Z7901 Long term (current) use of anticoagulants: Secondary | ICD-10-CM

## 2014-08-11 LAB — POCT INR: INR: 2.1

## 2014-08-25 ENCOUNTER — Ambulatory Visit (INDEPENDENT_AMBULATORY_CARE_PROVIDER_SITE_OTHER): Payer: PRIVATE HEALTH INSURANCE | Admitting: Ophthalmology

## 2014-09-03 ENCOUNTER — Ambulatory Visit (INDEPENDENT_AMBULATORY_CARE_PROVIDER_SITE_OTHER): Payer: Medicare Other | Admitting: Ophthalmology

## 2014-09-10 ENCOUNTER — Ambulatory Visit (INDEPENDENT_AMBULATORY_CARE_PROVIDER_SITE_OTHER): Payer: Medicare Other | Admitting: Internal Medicine

## 2014-09-10 ENCOUNTER — Encounter: Payer: Self-pay | Admitting: Internal Medicine

## 2014-09-10 VITALS — BP 118/64 | HR 89 | Ht 72.0 in | Wt 231.0 lb

## 2014-09-10 DIAGNOSIS — J441 Chronic obstructive pulmonary disease with (acute) exacerbation: Secondary | ICD-10-CM | POA: Diagnosis not present

## 2014-09-10 MED ORDER — LEVALBUTEROL HCL 0.63 MG/3ML IN NEBU
0.6300 mg | INHALATION_SOLUTION | Freq: Once | RESPIRATORY_TRACT | Status: AC
  Administered 2014-09-10: 0.63 mg via RESPIRATORY_TRACT

## 2014-09-10 MED ORDER — METHYLPREDNISOLONE ACETATE 80 MG/ML IJ SUSP
80.0000 mg | Freq: Once | INTRAMUSCULAR | Status: AC
  Administered 2014-09-10: 80 mg via INTRAMUSCULAR

## 2014-09-10 MED ORDER — AMOXICILLIN 500 MG PO CAPS: 500.0000 mg | ORAL_CAPSULE | Freq: Three times a day (TID) | ORAL | Status: DC

## 2014-09-10 NOTE — Assessment & Plan Note (Signed)
Probably a viral pattern respiratory infection to begin with but it hit him hard with his underlying COPD. Plan-nebulizer Xopenex, Depo-Medrol, amoxicillin to hold with discussion. 2 samples of Symbicort inhaler given.

## 2014-09-10 NOTE — Progress Notes (Signed)
Patient ID: Roberto Reilly, male    DOB: 09/01/1933, 81 y.o.   MRN: [id number]  HPI 12/06/10- [age] yoM former smoker, followed for COPD, complicated by CAD, diastolic heart failure, PAFib/chronic anticoagulation, HBP, Obesity Last here January 11, 2010. - Note reviewed. He was hosp for COPD exacerbation, diastolic heart failure, chronic respiratory failure  5/29-10/22/10 , but says he is back to baseline now.  He blames hot humid weather currently for cough with chest rattle with clear to trace yellow sputum. He feels well controlled now. Denies smoking. Dr [name] added Symbicort ? Strength. It may be helping some. Using ventolin rescue inhaler 0-2x/day. Triggers- weather, indoors to outdoors. Continues Spiriva. He is wearing a heart monitor for Dr Clarene DukeLittle, potential need for pacemaker.   01/06/11-77 yoM former smoker, followed for COPD, complicated by CAD, diastolic heart failure, PAFib/chronic anticoagulation, HBP, Obesity Blames occasional bad day on "marital stress". Feels he can't leave wife long enough to have pacemaker placed.  Good and bad days with breathing are affected by the weather.  He had home O2 after hospital- did ONOX- desat < 88% for over an hour, meeting qualifying criteria for home O2 for sleep- discussed.  He again says he is not interested in having a sleep study and would not choose to treat sleep apnea if he found he had it.   09/01/11- 77 yoM former smoker, followed for COPD, complicated by CAD, diastolic heart failure, PAFib/chronic anticoagulation, HBP, Obesity, old right thoracotomy Wife fell and broke her right arm so he has to do the housework and help her which is very depressing and confining. Breathing has been stable except he has to use his nebulizer at night. Gets up repeatedly for wife and he is tired and worn out. Little cough with scant clear phlegm. Did not qualify for help with Spiriva cost. Our office is looking at that. He did qualify for Xopenex. Using Symbicort and  nebulizer with no new heart issues since last here.  03/08/12- 77 yoM former smoker, followed for COPD, complicated by CAD, diastolic heart failure, PAFib/chronic anticoagulation, HBP, Obesity, old right thoracotomy Increased SOB with activity; denies any wheezing, cough, or congestion. COPD Assessment Test (CAT) score 14/40 Can't afford Spiriva and doesn't qualify for assistance. Asks samples. Occasional cough with clear phlegm, no wheeze, chest pain or blood.  Uses O2 at night most nights. We discussed O2 therapy. CXR 09/01/11 IMPRESSION:  Again noted mild hyperinflation. Stable chronic blunting of the  right costophrenic angle and mild elevation of the right  hemidiaphragm. This may be due to pleural thickening, scarring or  small right pleural effusion. No acute infiltrate or pulmonary  edema. Question old right lower healed rib fractures.  Original Report Authenticated By: Natasha MeadLIVIU POP, M.D.   09/06/12- [age] yoM former smoker, followed for COPD, complicated by CAD, diastolic heart failure, PAFib/chronic anticoagulation, HBP, Obesity, old right thoracotomy FOLLOWS FOR: Breathing is unchanged. Reports DOE and a dry cough from time to time. Denies chest pain or chest tightness. He is still using a leftover Xopenex, but has prescription for albuterol. Hopefully that won't bother his atrial fibrillation.  03/12/13- 79 yoM former smoker, followed for COPD, complicated by CAD, diastolic heart failure, PAFib/chronic anticoagulation, HBP, Obesity, old right thoracotomy FOLLOWS FOR: has noticed he is needing his O2 more each day.  Wife is in and out of the hospital, requiring a lot of his energy and time. He blames low oxygen saturation on arrival today on coming across the parking lot and being  cold. Says he can't afford rescue inhaler. Cough is only productive after his nebulizer-white with no blood.  09/10/13- 79 yoM former smoker, followed for COPD, complicated by CAD, diastolic heart failure,  PAFib/chronic anticoagulation, HBP, Obesity, old right thoracotomy FOLLOWS FOR:sob with exertion,denies cough or wheeze,no cp or thightness,samples no difference since last ov,still using Symbicort Feels controlled. Reports no change. Preoccupied caring for wife. Stays in to avoid weather changes. Some sneezing. CXR 05/20/13 IMPRESSION:  1. Increased atelectasis in the right lower lobe medially.  2. Chronic blunting of the right costophrenic angle with remote  right rib fractures and osteotomies.  Electronically Signed  By: Herbie BaltimoreWalt Liebkemann M.D.  On: 05/20/2013 15:50  03/11/14- 80 yoM former smoker, followed for COPD, complicated by CAD, diastolic heart failure, PAFib/chronic anticoagulation, HBP, Obesity, old right thoracotomy FOLLOW FOR: COPD SOB with exertion, wheeze no cough, nor chest tightness.. Still using Symbicort   09/10/14- 80 yoM former smoker, followed for COPD, complicated by CAD, diastolic heart failure, PAFib/chronic anticoagulation, HBP, Obesity, old right thoracotomy FOLLOWS FOR: Pt c/o DOE, chest congestion, prod cough with green and yellow mucus x 3 days. Pt denies CP/tightness anf f/c/s.  Has caught a distinct cold over the last 3 days. Denies fever. Sputum was green and chest was much tighter 2 days ago. No blood or pain.  Review of Systems- see HPI Constitutional:   No-   weight loss, night sweats, fevers, chills, fatigue, lassitude. HEENT:   No-   headaches, difficulty swallowing, tooth/dental problems, sore throat,                  No-   sneezing, itching, ear ache,+ nasal congestion, post nasal drip,  CV:  No-   chest pain, orthopnea, PND, swelling in lower extremities, anasarca, dizziness, palpitations GI:  No-   heartburn, indigestion, abdominal pain, nausea, vomiting,  Resp: , per HPI, Chronic cough              No-  coughing up of blood.             +  change in color of mucus.  +occasional wheezing.   Skin: No-   rash or lesions. GU: . MS:  No-   joint pain  or swelling.   Psych:  + change in mood or affect. + depression or anxiety.  No memory loss.   Objective:   Physical Exam General- Alert, Oriented, Affect-appropriate, Distress- none acute   Overweight.  Skin- rash-none, lesions- none, excoriation- none. + coumadin bruising Lymphadenopathy- none Head- atraumatic            Eyes- Gross vision intact, PERRLA, conjunctivae clear secretions            Ears- Hearing, canals            Nose- Clear, No-Septal dev, mucus, polyps, erosion, perforation             Throat- Mallampati II , mucosa clear , drainage- none, tonsils- atrophic, +dentures, +hoarse Neck- flexible , trachea midline, no stridor , thyroid nl, carotid no bruit Chest - symmetrical excursion , unlabored           Heart/CV- RRR( no pacer) , no murmur , no gallop  , no rub, nl s1 s2                           - JVD- none , edema- none, stasis changes- none, varices- none  Lung- +coarse rhonchi R>L. + Labored(room air saturation 92%) , no- cough, dullness-none, rub- none   unlabored           Chest wall- sternal scar Abd-  Br/ Gen/ Rectal- Not done, not indicated Extrem- + external varices on legs- soft with negative Homans Neuro- grossly intact to observation

## 2014-09-10 NOTE — Patient Instructions (Signed)
Neb xop 0.63  Depo 80   Script for amoxacillin with 1 refill, to take if you need an antibiotic  Sample x 2 Symbicort 160   2 puffs then rinse mouth, twice daily

## 2014-09-16 DIAGNOSIS — I2510 Atherosclerotic heart disease of native coronary artery without angina pectoris: Secondary | ICD-10-CM | POA: Diagnosis not present

## 2014-09-16 DIAGNOSIS — I480 Paroxysmal atrial fibrillation: Secondary | ICD-10-CM | POA: Diagnosis not present

## 2014-09-16 DIAGNOSIS — E780 Pure hypercholesterolemia: Secondary | ICD-10-CM | POA: Diagnosis not present

## 2014-09-18 ENCOUNTER — Ambulatory Visit (INDEPENDENT_AMBULATORY_CARE_PROVIDER_SITE_OTHER): Payer: Medicare Other | Admitting: Ophthalmology

## 2014-09-18 DIAGNOSIS — H43813 Vitreous degeneration, bilateral: Secondary | ICD-10-CM | POA: Diagnosis not present

## 2014-09-18 DIAGNOSIS — H35033 Hypertensive retinopathy, bilateral: Secondary | ICD-10-CM | POA: Diagnosis not present

## 2014-09-18 DIAGNOSIS — H35371 Puckering of macula, right eye: Secondary | ICD-10-CM | POA: Diagnosis not present

## 2014-09-18 DIAGNOSIS — H34832 Tributary (branch) retinal vein occlusion, left eye: Secondary | ICD-10-CM

## 2014-09-18 DIAGNOSIS — I10 Essential (primary) hypertension: Secondary | ICD-10-CM

## 2014-09-22 ENCOUNTER — Ambulatory Visit (INDEPENDENT_AMBULATORY_CARE_PROVIDER_SITE_OTHER): Payer: Medicare Other | Admitting: Pharmacist Clinician (PhC)/ Clinical Pharmacy Specialist

## 2014-09-22 DIAGNOSIS — I4891 Unspecified atrial fibrillation: Secondary | ICD-10-CM

## 2014-09-22 DIAGNOSIS — Z7901 Long term (current) use of anticoagulants: Secondary | ICD-10-CM

## 2014-09-22 LAB — POCT INR: INR: 2.3

## 2014-09-23 ENCOUNTER — Encounter: Payer: Self-pay | Admitting: Cardiology

## 2014-09-23 DIAGNOSIS — Z0000 Encounter for general adult medical examination without abnormal findings: Secondary | ICD-10-CM | POA: Diagnosis not present

## 2014-09-23 DIAGNOSIS — Z23 Encounter for immunization: Secondary | ICD-10-CM | POA: Diagnosis not present

## 2014-09-23 DIAGNOSIS — Z1389 Encounter for screening for other disorder: Secondary | ICD-10-CM | POA: Diagnosis not present

## 2014-11-02 ENCOUNTER — Other Ambulatory Visit: Payer: Self-pay | Admitting: Cardiology

## 2014-11-03 ENCOUNTER — Ambulatory Visit (INDEPENDENT_AMBULATORY_CARE_PROVIDER_SITE_OTHER): Payer: Medicare Other | Admitting: Pharmacist Clinician (PhC)/ Clinical Pharmacy Specialist

## 2014-11-03 DIAGNOSIS — Z7901 Long term (current) use of anticoagulants: Secondary | ICD-10-CM | POA: Diagnosis not present

## 2014-11-03 DIAGNOSIS — I4891 Unspecified atrial fibrillation: Secondary | ICD-10-CM

## 2014-11-03 LAB — POCT INR: INR: 2.1

## 2014-12-15 ENCOUNTER — Ambulatory Visit (INDEPENDENT_AMBULATORY_CARE_PROVIDER_SITE_OTHER): Payer: Medicare Other | Admitting: Pharmacist Clinician (PhC)/ Clinical Pharmacy Specialist

## 2014-12-15 DIAGNOSIS — Z7901 Long term (current) use of anticoagulants: Secondary | ICD-10-CM | POA: Diagnosis not present

## 2014-12-15 DIAGNOSIS — I4891 Unspecified atrial fibrillation: Secondary | ICD-10-CM

## 2014-12-15 LAB — POCT INR: INR: 2.1

## 2015-01-05 ENCOUNTER — Other Ambulatory Visit: Payer: Self-pay | Admitting: Cardiology

## 2015-01-05 NOTE — Telephone Encounter (Signed)
Rx request sent to pharmacy.  

## 2015-01-14 ENCOUNTER — Ambulatory Visit (INDEPENDENT_AMBULATORY_CARE_PROVIDER_SITE_OTHER): Payer: Medicare Other | Admitting: Pharmacist Clinician (PhC)/ Clinical Pharmacy Specialist

## 2015-01-14 ENCOUNTER — Ambulatory Visit: Payer: Medicare Other | Admitting: Cardiology

## 2015-01-14 DIAGNOSIS — Z7901 Long term (current) use of anticoagulants: Secondary | ICD-10-CM | POA: Diagnosis not present

## 2015-01-14 DIAGNOSIS — I4891 Unspecified atrial fibrillation: Secondary | ICD-10-CM | POA: Diagnosis not present

## 2015-01-14 LAB — POCT INR: INR: 2.2

## 2015-02-03 ENCOUNTER — Telehealth: Payer: Self-pay | Admitting: Internal Medicine

## 2015-02-03 MED ORDER — AZITHROMYCIN 250 MG PO TABS: ORAL_TABLET | ORAL | Status: DC

## 2015-02-03 NOTE — Telephone Encounter (Signed)
Offer Zpak 

## 2015-02-03 NOTE — Telephone Encounter (Signed)
Allergies  Allergen Reactions  . Prednisone     Makes pt jittery and nervous       Pt is requesting an antibiotic. States he feels like he is getting a cold and has prod cough in mornings and at night with white/sometimes green mucus. States PCP told him when he starts feeling this way to call and get antibiotic so that he doesn't get pneumonia.   CY please advise.

## 2015-02-03 NOTE — Telephone Encounter (Signed)
Called made pt aware of recs. RX sent in. Nothing further needed 

## 2015-02-09 ENCOUNTER — Other Ambulatory Visit: Payer: Self-pay | Admitting: Cardiology

## 2015-02-09 NOTE — Telephone Encounter (Signed)
REFILL 

## 2015-02-19 ENCOUNTER — Other Ambulatory Visit: Payer: Self-pay | Admitting: Cardiology

## 2015-02-19 NOTE — Telephone Encounter (Signed)
REFILL 

## 2015-02-23 ENCOUNTER — Ambulatory Visit (INDEPENDENT_AMBULATORY_CARE_PROVIDER_SITE_OTHER): Payer: Medicare Other | Admitting: Cardiology

## 2015-02-23 ENCOUNTER — Ambulatory Visit: Payer: Medicare Other | Admitting: Cardiology

## 2015-02-23 ENCOUNTER — Ambulatory Visit: Payer: Self-pay | Admitting: Pharmacist Clinician (PhC)/ Clinical Pharmacy Specialist

## 2015-02-23 ENCOUNTER — Ambulatory Visit (INDEPENDENT_AMBULATORY_CARE_PROVIDER_SITE_OTHER): Payer: Medicare Other | Admitting: Pharmacist Clinician (PhC)/ Clinical Pharmacy Specialist

## 2015-02-23 ENCOUNTER — Encounter: Payer: Self-pay | Admitting: Cardiology

## 2015-02-23 VITALS — BP 140/76 | HR 67 | Ht 72.0 in | Wt 226.5 lb

## 2015-02-23 DIAGNOSIS — I482 Chronic atrial fibrillation, unspecified: Secondary | ICD-10-CM

## 2015-02-23 DIAGNOSIS — Z7901 Long term (current) use of anticoagulants: Secondary | ICD-10-CM | POA: Diagnosis not present

## 2015-02-23 DIAGNOSIS — E785 Hyperlipidemia, unspecified: Secondary | ICD-10-CM

## 2015-02-23 DIAGNOSIS — I10 Essential (primary) hypertension: Secondary | ICD-10-CM

## 2015-02-23 DIAGNOSIS — I2510 Atherosclerotic heart disease of native coronary artery without angina pectoris: Secondary | ICD-10-CM

## 2015-02-23 DIAGNOSIS — I714 Abdominal aortic aneurysm, without rupture, unspecified: Secondary | ICD-10-CM

## 2015-02-23 DIAGNOSIS — I4891 Unspecified atrial fibrillation: Secondary | ICD-10-CM | POA: Diagnosis not present

## 2015-02-23 DIAGNOSIS — E669 Obesity, unspecified: Secondary | ICD-10-CM

## 2015-02-23 LAB — POCT INR: INR: 1.9

## 2015-02-23 NOTE — Progress Notes (Signed)
PCP: Juline Patch, MD  Clinic Note: Chief Complaint  Patient presents with  . Follow-up    ANNUAL.Marland KitchenMarland KitchenNO CHEST PAIN/PRESSURE OR SOB OUTSIDE OF NORMAL WITH COPD  . Coronary Artery Disease  . Varicose Veins    HPI: Roberto Reilly is a 81 y.o. male with a PMH below who presents today for ~ annual f/u for CAD-CABG. He is a former patient of Dr. Caprice Kluver. He has a history of CABG in 2007 following catheterization catheterization for dyspnea on exertion. He also has long-standing intermittently oxygen requiring COPD. He has been reluctant to undergo non-invasive ischemic evaluations in the past unless symptoms warrant.  Roberto Reilly was last seen on Jan 07, 2104.  Doing OK.   Recent Hospitalizations: none  Studies Reviewed: none  Interval History: Roberto Reilly presents today relatively stable. No active cardiac symptoms. He indicates that he has no more SOB than usual.  He actually has been doubled and more mobile doing more for exercise and exertion. He has been  spending a bit more time walking around with his wife than usual - doing OK. He says he can walk from his car into grocery stores malls using a cart and can walk doing his groceries without getting too short of breath before he gets back to the car. He has a wound on his right shin that appears to be feeling relatively well.  He hit his leg on something and causing avulsion. He peeled the skin and that led to become an ulcer. He has been taking a long time to heal. He has a lot of redness and swelling in that area but it is not hot. No fevers or chills. It's relatively stable now, having been going on for quite a while. He also notes evidence of pre-significant varicose veins in that leg compared to the left leg which was where his veins removed for his CABG.  No chest pain  with rest or exertion.  No PND, orthopnea No palpitations, lightheadedness, dizziness, weakness or syncope/near syncope. No TIA/amaurosis fugax symptoms. No  claudication.  ROS: A comprehensive was performed. Review of Systems  Constitutional: Negative for malaise/fatigue.  HENT: Negative for nosebleeds.   Respiratory: Positive for cough, shortness of breath and wheezing.        Basic chronic COPD related dyspnea. No change /difference  Cardiovascular: Negative for claudication.  Gastrointestinal: Negative for blood in stool and melena.  Genitourinary: Negative for hematuria.  Musculoskeletal: Positive for joint pain (Hips and knees mostly).  Neurological: Negative for headaches.  Psychiatric/Behavioral: Negative.      Past Medical History  Diagnosis Date  . History of non-ST Elevation MI (myocardial infarction) March 2007    Admitted with COPD exacerbation, given by CHF. Had some chest pain with positive troponins. Cath showed multivessel disease, referred for CABG  . CAD in native artery March 2007    Cath for dyspnea on exertion: 75% distal LM, 85% RI, 95% mid-distal Cx, in multiple RCA lesions with 95% distal. --> Referred for CABG; has declined further noninvasive evaluation in the absence of worsening symptoms  . S/P CABG x 24 July 2005    LIMA-LAD, SVG-OM, seq SVG-PDA -PLB  . Atrial fibrillation, chronic (HCC)     Anticoagulated on warfarin. Stable -- post op  . H/O echocardiogram March ; January 2015    a) Normal LV size and cavity appeared normal function. Grade 1 diastolic dysfunction. Limited study.; b) normal LV size and function with EF 60-65%. Aortic sclerosis without stenosis, mildly increased  PA pressures of but normal IVC and RA/RV size  . Obesity (BMI 30-39.9)     One year ago weighed 265 pounds, (12/2012) -- now 234 pounds  . Dyslipidemia, goal LDL below 70   . COPD (chronic obstructive pulmonary disease) with emphysema (HCC) 05/23/2007    Hosp 5/29-6/01/12- COPD exacerbation ONOX 12/08/10- desaturated to less than 88% for over an hour, qualifying for home O2 during sleep    . Hypoxia      chronic, on home O2  .  History of pneumonia   . Seasonal allergies   . Diverticulosis of colon with hemorrhage 05/24/2013    Past Surgical History  Procedure Laterality Date  . Cataract extraction      Laser  . Rll resection for hamartoma    . Rul for hamartoma    . Coronary artery bypass graft    . Cardiac catheterization  03 28 2007    NORMAL LV FUNCTION/ ABDOMINAL AORTA STENOSIS,75%-85%. RIGHT FEMORAL ARTERY :CATHETERS USED A  4-FRENCH WITH A 4-FRENCH SHEATH  . Colonoscopy N/A 05/24/2013    Procedure: COLONOSCOPY;  Surgeon: Iva Boop, MD;  Location: [facility];  Service: Endoscopy;  Laterality: N/A;    Prior to Admission medications   Medication Sig Start Date End Date Taking? Authorizing Provider  amLODipine (NORVASC) 10 MG tablet Take 1 tablet (10 mg total) by mouth daily. 05/01/13   Dorothea Ogle, MD  aspirin 81 MG tablet Take 81 mg by mouth daily.      Historical Provider, MD  azithromycin (ZITHROMAX) 250 MG tablet Take as directed 02/03/15   Waymon Budge, MD  budesonide-formoterol [facility]) 160-4.5 MCG/ACT inhaler Inhale 2 puffs into the lungs 2 (two) times daily. 03/11/14   Waymon Budge, MD  cholecalciferol (VITAMIN D) 1000 UNITS tablet Take 1,000 Units by mouth daily.    Historical Provider, MD  furosemide (LASIX) 40 MG tablet TAKE ONE TABLET BY MOUTH EVERY MORNING 02/19/15   Marykay Lex, MD  ipratropium-albuterol (DUONEB) 0.5-2.5 (3) MG/3ML SOLN Take 3 mLs by nebulization every 6 (six) hours as needed (wheezing and shortness of breath).     Historical Provider, MD  KLOR-CON M20 20 MEQ tablet TAKE 1 TABLET (20 MEQ TOTAL) BY MOUTH DAILY. 01/05/15   Marykay Lex, MD  Potassium Chloride ER 20 MEQ TBCR TAKE ONE TABLET BY MOUTH EVERY MORNING 01/07/14   Marykay Lex, MD  sertraline (ZOLOFT) 50 MG tablet Take 50 mg by mouth daily.    Historical Provider, MD  simvastatin (ZOCOR) 20 MG tablet Take 20 mg by mouth daily. 06/18/13   Historical Provider, MD  warfarin (COUMADIN) 2.5 MG tablet  TAKE 1-2 TABLETS BY MOUTH DAILY AS DIRECTED BY COUMADIN CLINIC 06/03/14   Marykay Lex, MD   Allergies  Allergen Reactions  . Prednisone Other (See Comments)    Makes pt jittery and nervous     Social History   Social History  . Marital Status: Married    Spouse Name: N/A  . Number of Children: 2  . Years of Education: N/A   Occupational History  . Retail Sales    Social History Main Topics  . Smoking status: Former Smoker -- 2.00 packs/day for 55 years    Types: Cigarettes    Quit date: 05/23/1998  . Smokeless tobacco: Never Used  . Alcohol Use: No  . Drug Use: No  . Sexual Activity: Not Asked   Other Topics Concern  . None   Social  History Narrative   He is a married father of 2, grandfather of 30, does not get routine exercise. He is a former smoker, but does not currently or not drink alcohol.    He is modifying his diet and trying to get activity but is doing better with the diet than the activity.    He is under a lot of stress. His wife who has been pretty much the caregiver for has been dealing with TIAs and other kind of issues that have been quite stressful for her. She also has back problems,   And coronary disease, uses a rolling walker and now has had a TIA and has been in and out of the hospital, and it seems to be almost overwhelming for him.   Family History  Problem Relation Age of Onset  . Heart attack Mother   . Heart attack Father      Wt Readings from Last 3 Encounters:  02/23/15 226 lb 8 oz (102.74 kg)  09/10/14 231 lb (104.781 kg)  03/11/14 238 lb 9.6 oz (108.228 kg)  -- evidently trying to lose weight. Is doing more exercise and trying to adjust his diet to  PHYSICAL EXAM BP 140/76 mmHg  Pulse 67  Ht 6' (1.829 m)  Wt 226 lb 8 oz (102.74 kg)  BMI 30.71 kg/m2  General appearance: alert, cooperative, appears stated age, no distress, moderately obese and otherwise healthy appearing. Answers questions appropriately. Pleasant mood and  affect. Loud breathing expiratory wheeze. He sits in a tripod position, but does not seem to be in distress to  Neck: no adenopathy, no carotid bruit, supple, symmetrical, trachea midline and unable to see jugular veins due to body habitus.  Lungs: Prolonged expiration phase almost 2-1. Diffuse interstitial sounds with mostly x-ray wheezing/rhonchi throughout. Most of the "rattling sounds are upper respiratory in nature as opposed to peripheral ". Hyperexpanded but no dullness to percussion.  Heart: RRR, S1, S2 normal, S3 present and No rubs or gallops noted. Very distant heart sounds. Unable to palpate PMI  Abdomen: soft, non-tender; bowel sounds normal; no masses, no organomegaly and Still has moderate truncal obesity although his overall weight puts him below this category.  Extremities: edema 1-2+ on the left and 1+ in the right, varicose veins noted and venous stasis dermatitis noted; Pulses: 2+ and symmetric  Skin: hyperpigmentation - lower leg(s) Bilaterally RIGHT>> left; large, engorged varicosities - right lower extremity from mid thigh down to midcalf. Mild spider veins.  Neurologic: Grossly normal    Adult ECG Report  Rate: 67 ;  Rhythm: normal sinus rhythm, sinus arrhythmia and RBBB. Otherwise normal axis, intervals and durations.;   Narrative Interpretation: stable EKG with no change from prior EKG.   Other studies Reviewed: Additional studies/ records that were reviewed today include:  Recent Labs:  Labs not available    ASSESSMENT / PLAN: Problem List Items Addressed This Visit    Obesity (BMI 30-39.9) (Chronic)    Working on losing weight as indicated.      Relevant Orders   EKG 12-Lead   Essential hypertension (Chronic)    He continues to be in borderline control on amlodipine may benefit from ARB if pressures continue to be elevated. He is taking Lasix which helped some.      Relevant Orders   EKG 12-Lead   Dyslipidemia, goal LDL below 70 (Chronic)    On  simvastatin. I don't have labs currently. We have recommended changing medication to atorvastatin or Crestor, even possibly  anxiety her periods. This has not occurred.  I do not have to go by. His labs have been checked by PCP. Would again consider titrating up for better control.      Relevant Orders   EKG 12-Lead   CAD in native artery - LM, RCA, RI & Cx disease --> CABG x 4 (LIMA-LAD, SVG-OM/RI, SVG-rPDA-rPL) (Chronic)    Continues to be stable from a cardiac standpoint. No active anginal symptoms. Unfortunately he really did not have angina that much back at the time of his CABG.  He exercises he is doing better her movement dyspnea standpoint being able to walk the entire week ago she saw her. No notable difference in his baseline dyspnea. He again declines any noninvasive evaluation with echo or nuclear stress test. Only symptoms occur. On aspirin, statin and amlodipine. Not on blocker because of COPD.      Relevant Orders   EKG 12-Lead   Atrial fibrillation, chronic / persistent (Chronic)    He actually appears to be in sinus rhythm today with P waves present. Anticoagulated with warfarin. Not on beta blocker. Also no longer on sotalol. Provided he has no rapid rates of A. Fib, would not restart.      Relevant Orders   EKG 12-Lead   AAA (abdominal aortic aneurysm) without rupture seen on CT scan - Primary (Chronic)    He continues to decline any evaluation for progression of disease. Readdress next visit.  Also would defer to PCP to continue to ask.      Relevant Orders   EKG 12-Lead      Current medicines are reviewed at length with the patient today. (+/- concerns) none The following changes have been made: no medication changes  Once your ulcer heals on right sides-- wear compression stockings everyday /take off at night. ( 20-30 hg Ankle 10.5 , calf 17 right leg   No other changes -   Your physician wants you to follow-up in  5-7  MONTHS WITH DR Lyana Asbill --30  MIN    Studies Ordered:   Orders Placed This Encounter  Procedures  . EKG 12-Lead      Marykay Lex, M.D., M.S. Interventional Cardiologist   Pager # [phone]

## 2015-02-23 NOTE — Patient Instructions (Addendum)
  Once your ulcer heals on right sides-- wear compression stockings everyday /take off at night. ( 20-30 hg Ankle 10.5 , calf 17 right leg   No other changes -   Your physician wants you to follow-up in  5-7  MONTHS WITH DR HARDING --30 MIN  You will receive a reminder letter in the mail two months in advance. If you don't receive a letter, please call our office to schedule the follow-up appointment.

## 2015-02-25 ENCOUNTER — Ambulatory Visit: Payer: Self-pay | Admitting: Pharmacist Clinician (PhC)/ Clinical Pharmacy Specialist

## 2015-02-25 ENCOUNTER — Encounter: Payer: Self-pay | Admitting: Cardiology

## 2015-02-25 NOTE — Assessment & Plan Note (Signed)
On simvastatin. I don't have labs currently. We have recommended changing medication to atorvastatin or Crestor, even possibly anxiety her periods. This has not occurred.  I do not have to go by. His labs have been checked by PCP. Would again consider titrating up for better control.

## 2015-02-25 NOTE — Assessment & Plan Note (Signed)
He actually appears to be in sinus rhythm today with P waves present. Anticoagulated with warfarin. Not on beta blocker. Also no longer on sotalol. Provided he has no rapid rates of A. Fib, would not restart.

## 2015-02-25 NOTE — Assessment & Plan Note (Addendum)
Continues to be stable from a cardiac standpoint. No active anginal symptoms. Unfortunately he really did not have angina that much back at the time of his CABG.  He exercises he is doing better her movement dyspnea standpoint being able to walk the entire week ago she saw her. No notable difference in his baseline dyspnea. He again declines any noninvasive evaluation with echo or nuclear stress test. Only symptoms occur. On aspirin, statin and amlodipine. Not on blocker because of COPD.

## 2015-02-25 NOTE — Assessment & Plan Note (Signed)
He continues to be in borderline control on amlodipine may benefit from ARB if pressures continue to be elevated. He is taking Lasix which helped some.

## 2015-02-25 NOTE — Assessment & Plan Note (Signed)
He continues to decline any evaluation for progression of disease. Readdress next visit.  Also would defer to PCP to continue to ask.

## 2015-02-25 NOTE — Assessment & Plan Note (Signed)
Working on losing weight as indicated.

## 2015-03-12 ENCOUNTER — Ambulatory Visit (INDEPENDENT_AMBULATORY_CARE_PROVIDER_SITE_OTHER): Payer: Medicare Other | Admitting: Internal Medicine

## 2015-03-12 ENCOUNTER — Encounter: Payer: Self-pay | Admitting: Internal Medicine

## 2015-03-12 VITALS — BP 142/70 | HR 68 | Ht 72.0 in | Wt 230.2 lb

## 2015-03-12 DIAGNOSIS — J449 Chronic obstructive pulmonary disease, unspecified: Secondary | ICD-10-CM | POA: Diagnosis not present

## 2015-03-12 DIAGNOSIS — I2510 Atherosclerotic heart disease of native coronary artery without angina pectoris: Secondary | ICD-10-CM

## 2015-03-12 DIAGNOSIS — Z23 Encounter for immunization: Secondary | ICD-10-CM

## 2015-03-12 NOTE — Patient Instructions (Signed)
Sample x 3 Symbicort 160-     2 puffs then rinse mouth well, twice daily  Flu vax  You can check with your insurance to see if they will cover an alternative better- like Breo 100, Dulera 100, or Advair 250

## 2015-03-12 NOTE — Progress Notes (Signed)
Patient ID: Roberto Reilly, male    DOB: 09/01/1933, 81 y.o.   MRN: [id number]  HPI 12/06/10- [age] yoM former smoker, followed for COPD, complicated by CAD, diastolic heart failure, PAFib/chronic anticoagulation, HBP, Obesity Last here January 11, 2010. - Note reviewed. He was hosp for COPD exacerbation, diastolic heart failure, chronic respiratory failure  5/29-10/22/10 , but says he is back to baseline now.  He blames hot humid weather currently for cough with chest rattle with clear to trace yellow sputum. He feels well controlled now. Denies smoking. Dr [name] added Symbicort ? Strength. It may be helping some. Using ventolin rescue inhaler 0-2x/day. Triggers- weather, indoors to outdoors. Continues Spiriva. He is wearing a heart monitor for Dr Clarene DukeLittle, potential need for pacemaker.   01/06/11-77 yoM former smoker, followed for COPD, complicated by CAD, diastolic heart failure, PAFib/chronic anticoagulation, HBP, Obesity Blames occasional bad day on "marital stress". Feels he can't leave wife long enough to have pacemaker placed.  Good and bad days with breathing are affected by the weather.  He had home O2 after hospital- did ONOX- desat < 88% for over an hour, meeting qualifying criteria for home O2 for sleep- discussed.  He again says he is not interested in having a sleep study and would not choose to treat sleep apnea if he found he had it.   09/01/11- 77 yoM former smoker, followed for COPD, complicated by CAD, diastolic heart failure, PAFib/chronic anticoagulation, HBP, Obesity, old right thoracotomy Wife fell and broke her right arm so he has to do the housework and help her which is very depressing and confining. Breathing has been stable except he has to use his nebulizer at night. Gets up repeatedly for wife and he is tired and worn out. Little cough with scant clear phlegm. Did not qualify for help with Spiriva cost. Our office is looking at that. He did qualify for Xopenex. Using Symbicort and  nebulizer with no new heart issues since last here.  03/08/12- 77 yoM former smoker, followed for COPD, complicated by CAD, diastolic heart failure, PAFib/chronic anticoagulation, HBP, Obesity, old right thoracotomy Increased SOB with activity; denies any wheezing, cough, or congestion. COPD Assessment Test (CAT) score 14/40 Can't afford Spiriva and doesn't qualify for assistance. Asks samples. Occasional cough with clear phlegm, no wheeze, chest pain or blood.  Uses O2 at night most nights. We discussed O2 therapy. CXR 09/01/11 IMPRESSION:  Again noted mild hyperinflation. Stable chronic blunting of the  right costophrenic angle and mild elevation of the right  hemidiaphragm. This may be due to pleural thickening, scarring or  small right pleural effusion. No acute infiltrate or pulmonary  edema. Question old right lower healed rib fractures.  Original Report Authenticated By: Natasha MeadLIVIU POP, M.D.   09/06/12- [age] yoM former smoker, followed for COPD, complicated by CAD, diastolic heart failure, PAFib/chronic anticoagulation, HBP, Obesity, old right thoracotomy FOLLOWS FOR: Breathing is unchanged. Reports DOE and a dry cough from time to time. Denies chest pain or chest tightness. He is still using a leftover Xopenex, but has prescription for albuterol. Hopefully that won't bother his atrial fibrillation.  03/12/13- 79 yoM former smoker, followed for COPD, complicated by CAD, diastolic heart failure, PAFib/chronic anticoagulation, HBP, Obesity, old right thoracotomy FOLLOWS FOR: has noticed he is needing his O2 more each day.  Wife is in and out of the hospital, requiring a lot of his energy and time. He blames low oxygen saturation on arrival today on coming across the parking lot and being  cold. Says he can't afford rescue inhaler. Cough is only productive after his nebulizer-white with no blood.  09/10/13- 79 yoM former smoker, followed for COPD, complicated by CAD, diastolic heart failure,  PAFib/chronic anticoagulation, HBP, Obesity, old right thoracotomy FOLLOWS FOR:sob with exertion,denies cough or wheeze,no cp or thightness,samples no difference since last ov,still using Symbicort Feels controlled. Reports no change. Preoccupied caring for wife. Stays in to avoid weather changes. Some sneezing. CXR 05/20/13 IMPRESSION:  1. Increased atelectasis in the right lower lobe medially.  2. Chronic blunting of the right costophrenic angle with remote  right rib fractures and osteotomies.  Electronically Signed  By: Herbie Baltimore M.D.  On: 05/20/2013 15:50  03/11/14- 80 yoM former smoker, followed for COPD, complicated by CAD, diastolic heart failure, PAFib/chronic anticoagulation, HBP, Obesity, old right thoracotomy FOLLOW FOR: COPD SOB with exertion, wheeze no cough, nor chest tightness.. Still using Symbicort   09/10/14- 80 yoM former smoker, followed for COPD, complicated by CAD, diastolic heart failure, PAFib/chronic anticoagulation, HBP, Obesity, old right thoracotomy FOLLOWS FOR: Pt c/o DOE, chest congestion, prod cough with green and yellow mucus x 3 days. Pt denies CP/tightness anf f/c/s.  Has caught a distinct cold over the last 3 days. Denies fever. Sputum was green and chest was much tighter 2 days ago. No blood or pain.  03/12/15- 80 yoM former smoker, followed for COPD, complicated by CAD, diastolic heart failure, PAFib/chronic anticoagulation, HBP, Obesity, old right thoracotomy FOLLOWS FOR:Sob with exertion-same,cough-"frosty white",wheezing,denies cp or tightness He describes his cough as routine pattern for him with no acute exacerbation or change. No blood or chest pain.  Review of Systems- see HPI Constitutional:   No-   weight loss, night sweats, fevers, chills, fatigue, lassitude. HEENT:   No-   headaches, difficulty swallowing, tooth/dental problems, sore throat,                  No-   sneezing, itching, ear ache,+ nasal congestion, post nasal drip,  CV:   No-   chest pain, orthopnea, PND, swelling in lower extremities, anasarca, dizziness, palpitations GI:  No-   heartburn, indigestion, abdominal pain, nausea, vomiting,  Resp: , per HPI, Chronic cough              No-  coughing up of blood.             +  change in color of mucus.  +occasional wheezing.   Skin: No-   rash or lesions. GU: . MS:  No-   joint pain or swelling.   Psych:  + change in mood or affect. + depression or anxiety.  No memory loss.   Objective:   Physical Exam General- Alert, Oriented, Affect-appropriate, Distress- none acute   + Overweight.  Skin- rash-none, lesions- none, excoriation- none. + coumadin bruising Lymphadenopathy- none Head- atraumatic            Eyes- Gross vision intact, PERRLA, conjunctivae clear secretions            Ears- Hearing, canals            Nose- Clear, No-Septal dev, mucus, polyps, erosion, perforation             Throat- Mallampati II , mucosa clear , drainage- none, tonsils- atrophic, +dentures, +hoarse Neck- flexible , trachea midline, no stridor , thyroid nl, carotid no bruit Chest - symmetrical excursion , unlabored           Heart/CV-+ almost regular RR( no pacer) , no murmur ,  no gallop  , no rub, nl s1 s2                           - JVD- none , edema- none, stasis changes- none, varices- none           Lung- +coarse breath sounds, + slight wheeze , no- cough, dullness-none, rub- none   unlabored           Chest wall- sternal scar Abd-  Br/ Gen/ Rectal- Not done, not indicated Extrem- + external varices on legs Neuro- grossly intact to observation

## 2015-03-14 NOTE — Assessment & Plan Note (Addendum)
He denies acute symptoms on this visit and remains under management by cardiology.

## 2015-03-14 NOTE — Assessment & Plan Note (Signed)
He wheezes procedures because of cost. Finances are difficult. Clinically doing okay without acute exacerbation. Previous pulmonary function testing is in old paper chart. Plan-samples of Symbicort, flu vaccine, anticipate spirometry next visit.

## 2015-03-16 ENCOUNTER — Telehealth: Payer: Self-pay | Admitting: Pharmacist Clinician (PhC)/ Clinical Pharmacy Specialist

## 2015-03-16 MED ORDER — WARFARIN SODIUM 2.5 MG PO TABS: ORAL_TABLET | ORAL | Status: DC

## 2015-03-16 NOTE — Telephone Encounter (Signed)
°  STAT if patient is at the pharmacy , call can be transferred to refill team.   1. Which medications need to be refilled? Warfarin (1 po day and Mon Wed and Fri, 1 po in PM )  2. Which [facility] [address]   3. Do they need a 30 day or 90 day supply? 30  * pt is running low*

## 2015-04-06 ENCOUNTER — Ambulatory Visit (INDEPENDENT_AMBULATORY_CARE_PROVIDER_SITE_OTHER): Payer: Medicare Other | Admitting: Pharmacist Clinician (PhC)/ Clinical Pharmacy Specialist

## 2015-04-06 DIAGNOSIS — I4891 Unspecified atrial fibrillation: Secondary | ICD-10-CM

## 2015-04-06 DIAGNOSIS — Z7901 Long term (current) use of anticoagulants: Secondary | ICD-10-CM | POA: Diagnosis not present

## 2015-04-06 LAB — POCT INR: INR: 1.9

## 2015-05-11 ENCOUNTER — Ambulatory Visit (INDEPENDENT_AMBULATORY_CARE_PROVIDER_SITE_OTHER): Payer: Medicare Other | Admitting: Pharmacist Clinician (PhC)/ Clinical Pharmacy Specialist

## 2015-05-11 DIAGNOSIS — Z7901 Long term (current) use of anticoagulants: Secondary | ICD-10-CM | POA: Diagnosis not present

## 2015-05-11 DIAGNOSIS — I4891 Unspecified atrial fibrillation: Secondary | ICD-10-CM

## 2015-05-11 LAB — POCT INR: INR: 2.1

## 2015-06-23 ENCOUNTER — Ambulatory Visit (INDEPENDENT_AMBULATORY_CARE_PROVIDER_SITE_OTHER): Payer: Medicare Other | Admitting: Pharmacist Clinician (PhC)/ Clinical Pharmacy Specialist

## 2015-06-23 DIAGNOSIS — I4891 Unspecified atrial fibrillation: Secondary | ICD-10-CM

## 2015-06-23 DIAGNOSIS — Z7901 Long term (current) use of anticoagulants: Secondary | ICD-10-CM | POA: Diagnosis not present

## 2015-06-23 LAB — POCT INR: INR: 2

## 2015-07-22 DIAGNOSIS — I159 Secondary hypertension, unspecified: Secondary | ICD-10-CM | POA: Diagnosis not present

## 2015-07-22 DIAGNOSIS — Z1389 Encounter for screening for other disorder: Secondary | ICD-10-CM | POA: Diagnosis not present

## 2015-07-22 DIAGNOSIS — G4700 Insomnia, unspecified: Secondary | ICD-10-CM | POA: Diagnosis not present

## 2015-07-22 DIAGNOSIS — E785 Hyperlipidemia, unspecified: Secondary | ICD-10-CM | POA: Diagnosis not present

## 2015-08-04 ENCOUNTER — Ambulatory Visit (INDEPENDENT_AMBULATORY_CARE_PROVIDER_SITE_OTHER): Payer: Medicare Other | Admitting: Pharmacist Clinician (PhC)/ Clinical Pharmacy Specialist

## 2015-08-04 ENCOUNTER — Other Ambulatory Visit: Payer: Self-pay | Admitting: Cardiology

## 2015-08-04 DIAGNOSIS — I4891 Unspecified atrial fibrillation: Secondary | ICD-10-CM | POA: Diagnosis not present

## 2015-08-04 DIAGNOSIS — Z7901 Long term (current) use of anticoagulants: Secondary | ICD-10-CM | POA: Diagnosis not present

## 2015-08-04 LAB — POCT INR: INR: 2.2

## 2015-08-04 NOTE — Telephone Encounter (Signed)
REFILL 

## 2015-09-07 DIAGNOSIS — I159 Secondary hypertension, unspecified: Secondary | ICD-10-CM | POA: Diagnosis not present

## 2015-09-07 DIAGNOSIS — E785 Hyperlipidemia, unspecified: Secondary | ICD-10-CM | POA: Diagnosis not present

## 2015-09-10 DIAGNOSIS — L97519 Non-pressure chronic ulcer of other part of right foot with unspecified severity: Secondary | ICD-10-CM | POA: Diagnosis not present

## 2015-09-10 DIAGNOSIS — Z0000 Encounter for general adult medical examination without abnormal findings: Secondary | ICD-10-CM | POA: Diagnosis not present

## 2015-09-10 DIAGNOSIS — J449 Chronic obstructive pulmonary disease, unspecified: Secondary | ICD-10-CM | POA: Diagnosis not present

## 2015-09-10 DIAGNOSIS — I4891 Unspecified atrial fibrillation: Secondary | ICD-10-CM | POA: Diagnosis not present

## 2015-09-14 ENCOUNTER — Ambulatory Visit (INDEPENDENT_AMBULATORY_CARE_PROVIDER_SITE_OTHER): Payer: Medicare Other | Admitting: Pharmacist

## 2015-09-14 DIAGNOSIS — Z7901 Long term (current) use of anticoagulants: Secondary | ICD-10-CM

## 2015-09-14 DIAGNOSIS — I4891 Unspecified atrial fibrillation: Secondary | ICD-10-CM

## 2015-09-14 LAB — POCT INR: INR: 2.1

## 2015-09-15 ENCOUNTER — Ambulatory Visit: Payer: Medicare Other | Admitting: Internal Medicine

## 2015-09-18 DIAGNOSIS — L97514 Non-pressure chronic ulcer of other part of right foot with necrosis of bone: Secondary | ICD-10-CM | POA: Diagnosis not present

## 2015-09-23 ENCOUNTER — Ambulatory Visit (INDEPENDENT_AMBULATORY_CARE_PROVIDER_SITE_OTHER): Payer: Medicare Other | Admitting: Ophthalmology

## 2015-09-28 ENCOUNTER — Telehealth: Payer: Self-pay | Admitting: *Deleted

## 2015-09-28 NOTE — Telephone Encounter (Signed)
Request for surgical clearance:  1. What type of surgery is being performed? RIGHT FOOT  TRANSMETATARSAL AMPUTATION AND ACHILLE TENDON LENGTHENING,RIGHT:PT AL--PERCUTANEOUS TENDO ACHILLES LENGTHENING  2. When is this surgery scheduled? 10/08/15  3. Are there any medications that need to be held prior to surgery and how long? WARFARIN ( WILL NEED TO CONTACT PATIENT ON INSTRUCTION ON MEDICATIONS)  4. Name of physician performing surgery?  DR Jonny RuizJOHN HEWITT  5. What is your office phone and fax number? FAX [phone];  PHONE [phone]   ATTN :VELVET  MC[facility] 6.  7. FORWARD TO [facility]

## 2015-09-29 NOTE — Telephone Encounter (Signed)
Called pt to go over warfarin dosing instructions and schedule INR after procedure. Pt stated he understood directions and appointment made for 1 week after procedure.

## 2015-09-29 NOTE — Telephone Encounter (Signed)
This is a Low Risk Surgery. He does not have Active CAD or CHF Sx. No Known CVA, DM or CKD.  PREOPERATIVE CARDIAC RISK ASSESSMENT   Revised Cardiac Risk Index:  High Risk Surgery: no;   Defined as Intraperitoneal, intrathoracic or suprainguinal vascular  Active CAD: no;  CHF: no; but does have O2 requiring COPD  Cerebrovascular Disease: no;   Diabetes: no; On Insulin: no  CKD (Cr >~ 2): no;   Total: 0 Estimated Risk of Adverse Outcome: From a strictly Cardiac standpoint = LOW*  Estimated Risk of MI, PE, VF/VT (Cardiac Arrest), Complete Heart Block: ~1 %; UNABLE to take BB.  * - Need to have Pulmonary input for COPD/O2 issues.   ACC/AHA Guidelines for "Clearance":  Step 1 - Need for Emergency Surgery: No:   If Yes - go straight to OR with perioperative surveillance  Step 2 - Active Cardiac Conditions (Unstable Angina, Decompensated HF, Significant  Arrhytmias - Complete HB, Mobitz II, Symptomatic VT or SVT, Severe Aortic Stenosis - mean gradient > 40 mmHg, Valve area < 1.0 cm2):   No:   If Yes - Evaluate & Treat per ACC/AHA Guidelines  Step 3 -  Low Risk Surgery: Yes  If Yes --> proceed to OR  If No --> Step 4  Would proceed to the OR without additional Cardiac workup.   Marykay LexHARDING, DAVID W, MD

## 2015-09-29 NOTE — Telephone Encounter (Signed)
ROUTED IN[facility]

## 2015-10-01 ENCOUNTER — Other Ambulatory Visit: Payer: Self-pay | Admitting: Orthopedic Surgery

## 2015-10-06 ENCOUNTER — Encounter (HOSPITAL_COMMUNITY)
Admission: RE | Admit: 2015-10-06 | Discharge: 2015-10-06 | Disposition: A | Discharge status: Home / Self Care | Payer: Medicare Other | Source: Ambulatory Visit | Attending: Orthopedic Surgery | Admitting: Orthopedic Surgery

## 2015-10-06 ENCOUNTER — Encounter (HOSPITAL_COMMUNITY): Payer: Self-pay

## 2015-10-06 DIAGNOSIS — R0602 Shortness of breath: Secondary | ICD-10-CM | POA: Diagnosis not present

## 2015-10-06 DIAGNOSIS — Z951 Presence of aortocoronary bypass graft: Secondary | ICD-10-CM | POA: Diagnosis not present

## 2015-10-06 DIAGNOSIS — I739 Peripheral vascular disease, unspecified: Secondary | ICD-10-CM | POA: Diagnosis not present

## 2015-10-06 DIAGNOSIS — I509 Heart failure, unspecified: Secondary | ICD-10-CM | POA: Diagnosis not present

## 2015-10-06 DIAGNOSIS — L97419 Non-pressure chronic ulcer of right heel and midfoot with unspecified severity: Secondary | ICD-10-CM | POA: Diagnosis not present

## 2015-10-06 DIAGNOSIS — I2510 Atherosclerotic heart disease of native coronary artery without angina pectoris: Secondary | ICD-10-CM | POA: Diagnosis not present

## 2015-10-06 DIAGNOSIS — Z9849 Cataract extraction status, unspecified eye: Secondary | ICD-10-CM | POA: Diagnosis not present

## 2015-10-06 DIAGNOSIS — Z87891 Personal history of nicotine dependence: Secondary | ICD-10-CM | POA: Diagnosis not present

## 2015-10-06 DIAGNOSIS — I252 Old myocardial infarction: Secondary | ICD-10-CM | POA: Diagnosis not present

## 2015-10-06 DIAGNOSIS — Z8249 Family history of ischemic heart disease and other diseases of the circulatory system: Secondary | ICD-10-CM | POA: Diagnosis not present

## 2015-10-06 DIAGNOSIS — F419 Anxiety disorder, unspecified: Secondary | ICD-10-CM | POA: Diagnosis not present

## 2015-10-06 DIAGNOSIS — I714 Abdominal aortic aneurysm, without rupture: Secondary | ICD-10-CM | POA: Diagnosis not present

## 2015-10-06 DIAGNOSIS — R0600 Dyspnea, unspecified: Secondary | ICD-10-CM | POA: Diagnosis not present

## 2015-10-06 DIAGNOSIS — Z6828 Body mass index (BMI) 28.0-28.9, adult: Secondary | ICD-10-CM | POA: Diagnosis not present

## 2015-10-06 DIAGNOSIS — R0902 Hypoxemia: Secondary | ICD-10-CM | POA: Diagnosis not present

## 2015-10-06 DIAGNOSIS — Z79899 Other long term (current) drug therapy: Secondary | ICD-10-CM | POA: Diagnosis not present

## 2015-10-06 DIAGNOSIS — E669 Obesity, unspecified: Secondary | ICD-10-CM | POA: Diagnosis not present

## 2015-10-06 DIAGNOSIS — Z9981 Dependence on supplemental oxygen: Secondary | ICD-10-CM | POA: Diagnosis not present

## 2015-10-06 DIAGNOSIS — M869 Osteomyelitis, unspecified: Secondary | ICD-10-CM | POA: Diagnosis not present

## 2015-10-06 DIAGNOSIS — Z7982 Long term (current) use of aspirin: Secondary | ICD-10-CM | POA: Diagnosis not present

## 2015-10-06 DIAGNOSIS — I110 Hypertensive heart disease with heart failure: Secondary | ICD-10-CM | POA: Diagnosis not present

## 2015-10-06 DIAGNOSIS — J449 Chronic obstructive pulmonary disease, unspecified: Secondary | ICD-10-CM | POA: Diagnosis not present

## 2015-10-06 DIAGNOSIS — E785 Hyperlipidemia, unspecified: Secondary | ICD-10-CM | POA: Diagnosis not present

## 2015-10-06 DIAGNOSIS — Z888 Allergy status to other drugs, medicaments and biological substances status: Secondary | ICD-10-CM | POA: Diagnosis not present

## 2015-10-06 DIAGNOSIS — Z7901 Long term (current) use of anticoagulants: Secondary | ICD-10-CM | POA: Diagnosis not present

## 2015-10-06 DIAGNOSIS — D649 Anemia, unspecified: Secondary | ICD-10-CM | POA: Diagnosis not present

## 2015-10-06 DIAGNOSIS — I482 Chronic atrial fibrillation: Secondary | ICD-10-CM | POA: Diagnosis not present

## 2015-10-06 HISTORY — DX: Abdominal aortic aneurysm, without rupture: I71.4

## 2015-10-06 HISTORY — DX: Anxiety disorder, unspecified: F41.9

## 2015-10-06 HISTORY — DX: Reserved for inherently not codable concepts without codable children: IMO0001

## 2015-10-06 HISTORY — DX: Abdominal aortic aneurysm, without rupture, unspecified: I71.40

## 2015-10-06 HISTORY — DX: Essential (primary) hypertension: I10

## 2015-10-06 LAB — CBC
HEMATOCRIT: 38.7 % — AB (ref 39.0–52.0)
Hemoglobin: 11.6 g/dL — ABNORMAL LOW (ref 13.0–17.0)
MCH: 24 pg — AB (ref 26.0–34.0)
MCHC: 30 g/dL (ref 30.0–36.0)
MCV: 80.1 fL (ref 78.0–100.0)
PLATELETS: 313 10*3/uL (ref 150–400)
RBC: 4.83 MIL/uL (ref 4.22–5.81)
RDW: 16.4 % — AB (ref 11.5–15.5)
WBC: 8.1 10*3/uL (ref 4.0–10.5)

## 2015-10-06 LAB — PROTIME-INR
INR: 1.64 — ABNORMAL HIGH (ref 0.00–1.49)
Prothrombin Time: 19.5 seconds — ABNORMAL HIGH (ref 11.6–15.2)

## 2015-10-06 LAB — BASIC METABOLIC PANEL
Anion gap: 8 (ref 5–15)
BUN: 17 mg/dL (ref 6–20)
CHLORIDE: 102 mmol/L (ref 101–111)
CO2: 27 mmol/L (ref 22–32)
CREATININE: 1.15 mg/dL (ref 0.61–1.24)
Calcium: 8.6 mg/dL — ABNORMAL LOW (ref 8.9–10.3)
GFR calc Af Amer: >60 mL/min (ref >60)
GFR calc non Af Amer: 59 mL/min — ABNORMAL LOW (ref >60)
GLUCOSE: 100 mg/dL — AB (ref 65–99)
POTASSIUM: 4.4 mmol/L (ref 3.5–5.1)
Sodium: 137 mmol/L (ref 135–145)

## 2015-10-06 LAB — APTT: APTT: 37 s (ref 24–37)

## 2015-10-06 NOTE — Pre-Procedure Instructions (Addendum)
Karna ChristmasJames Lopes  10/06/2015      [id number] [place]- [place], [place] - [phone] [place] [address] Phone: [phone] Fax: [phone]  [facility] [place], [place] - [address]  [place] [id number] Phone: [phone] Fax: [phone]    Your procedure is scheduled on 10/08/15.  [facility].M.  Call this number if you have problems the morning of surgery:  [phone]   Remember:  Do not eat food or drink liquids after midnight.  Take these medicines the morning of surgery with A SIP OF WATER amlodipine,inhaler if needed,neb if needed,sertraline(zoloft)  STOP all herbel meds, nsaids (aleve,naproxen,advil,ibuprofen) today including vitamins, aspirin  Stop coumadin   Do not wear jewelry, make-up or nail polish.  Do not wear lotions, powders, or perfumes.  You may wear deodorant.  Do not shave 48 hours prior to surgery.  Men may shave face and neck.  Do not bring valuables to the hospital.  [facility] is not responsible for any belongings or valuables.  Contacts, dentures or bridgework may not be worn into surgery.  Leave your suitcase in the car.  After surgery it may be brought to your room.  For patients admitted to the hospital, discharge time will be determined by your treatment team.  Patients discharged the day of surgery will not be allowed to drive home.   Name and phone number of your driver:    Special instructions:   Special Instructions: [place] - Preparing for Surgery  Before surgery, you can play an important role.  Because skin is not sterile, your skin needs to be as free of germs as possible.  You can reduce the number of germs on you skin by washing with CHG (chlorahexidine gluconate) soap before surgery.  CHG is an antiseptic cleaner which kills germs and bonds with the skin to continue killing germs even after washing.  Please DO  NOT use if you have an allergy to CHG or antibacterial soaps.  If your skin becomes reddened/irritated stop using the CHG and inform your nurse when you arrive at Short Stay.  Do not shave (including legs and underarms) for at least 48 hours prior to the first CHG shower.  You may shave your face.  Please follow these instructions carefully:   1.  Shower with CHG Soap the night before surgery and the morning of Surgery.  2.  If you choose to wash your hair, wash your hair first as usual with your normal shampoo.  3.  After you shampoo, rinse your hair and body thoroughly to remove the Shampoo.  4.  Use CHG as you would any other liquid soap.  You can apply chg directly  to the skin and wash gently with scrungie or a clean washcloth.  5.  Apply the CHG Soap to your body ONLY FROM THE NECK DOWN.  Do not use on open wounds or open sores.  Avoid contact with your eyes ears, mouth and genitals (private parts).  Wash genitals (private parts)       with your normal soap.  6.  Wash thoroughly, paying special attention to the area where your surgery will be performed.  7.  Thoroughly rinse your body with warm water from the neck down.  8.  DO NOT shower/wash with your normal soap after using and rinsing off the CHG Soap.  9.  Pat yourself dry with a clean  towel.            10.  Wear clean pajamas.            11.  Place clean sheets on your bed the night of your first shower and do not sleep with pets.  Day of Surgery  Do not apply any lotions/deodorants the morning of surgery.  Please wear clean clothes to the hospital/surgery center.  Please read over the following fact sheets that you were given. Pain Booklet, Coughing and Deep Breathing and Surgical Site Infection Prevention

## 2015-10-06 NOTE — Progress Notes (Signed)
Anesthesia PAT Evaluation: Patient is a 82 year old male scheduled for right foot transmet amputation, achilles tendon lengthening on 10/08/15. DX: Right fore foot ulcers and osteomyelitis. Anesthesia type is posted for choice.   History includes former smoker (quit '00), NSTEMI '07 s/p CABG (LIMA-LAD, SVG-OM, SVG-PDA-PLB) 07/2005, PAF/afib, dyslipidemia, COPD on home O2 (2L/[place] at night and with activity), HTN, SOB, anxiety, RLL resection for hamartoma, AAA (4.2 X 4.8 cm by 05/20/13 CT; declined further evaluation 02/25/15).   PCP is Dr. Pearson GrippeJames Kim (only seen once; was seeing Dr. [name] until he relocated).  Pulmonologist is Dr. Jetty Duhamellinton Young, last visit 03/12/15. He wrote, "He wheezes procedures because of cost. Finances are difficult. Clinically doing okay without acute exacerbation. Previous pulmonary function testing is in old paper chart.  Plan-samples of Symbicort, flu vaccine, anticipate spirometry next visit." Next follow-up 11/26/15. Today, Dr. Maple HudsonYoung signed a note of pulmonary clearance but recommended cardiology clearance as well. Also recommended "close watch for cardiopulmonary complications."  Cardiologist is Dr. Herbie BaltimoreHarding, last visit 02/25/15. Patient was back in SR with PACs. He is not on b-blocker therapy, I believe due to his COPD. Again patient declined further evaluation of his AAA noted in 2014. Dr. Herbie BaltimoreHarding plans to keep readdressing at follow-up visits (scheduled 10/16/15). Per telephone encounter 09/29/15, Dr. Herbie BaltimoreHarding felt patient was okay to proceed without additional cardiac work-up, but recommended pulmonary input.   Meds include amlodipine, ASA (on hold), Symbicort, Lasix, Duoneb, KCl, Zoloft, Zocor, warfarin (on hold "for a week" until after surgery).  PAT Vitals: BP 144/82, HR 57, T 36.5C, RR 18, O2 sat 90% after walking into PAT, 96% after rest. On exam, he has DOE with minimal exertion (ie, walking from room to room), but improved after a few minutes of rest. He has an occasional  productive cough which he says is his baseline--worse first thing in the morning. He denied an recent acute respiratory infections, new cough, or fever. He uses Spiriva daily and Duoneb QID for the past six months. He feels his respiratory status is stable for at least six months. He does have edema in his right foot and lower extremity. Lungs were very coarse initially. I had him cough which cleared the rhonchi but still with inspiratory and expiratory wheezes but positive air movement heard. Heart sounds distant, but sounded regular. I did not appreciate a murmur. He has full dentures.  02/23/15 EKG ([facility]): NSR with sinus arrhythmia, PACs, right BBB.  05/25/13 Echo: Study Conclusions - Left ventricle: The cavity size was normal. Wall thickness was normal. Systolic function was normal. The estimated ejection fraction was in the range of 60% to 65%. Images were inadequate for LV wall motion assessment. LV diastolic function cannot be assessed. - Aortic valve: Sclerosis without stenosis. Transvalvular velocity was minimally increased. No regurgitation. - Left atrium: The atrium was normal in size. - Right atrium: The atrium was normal in size. - Atrial septum: No defect or patent foramen ovale was identified. - Pulmonic valve: Peak gradient: 11mm Hg (S). Elevated gradient appears to be due to high CO - no evidence for PS. - Pulmonary arteries: PA peak pressure: 44mm Hg (S). - Inferior vena cava: The vessel was normal in size; the respirophasic diameter changes were in the normal range (= 50%); findings are consistent with normal central venous pressure. - Pericardium, extracardiac: Apical echo free space, may represent a fat pad.  11/2010 6 day event monitor: Occasionally sinus activity, mostly afib.  01/29/08 Carotid U/S: 0-49% BICA reduction. Right vertebral  artery is no visualized, which may indicate occlusive disease. Left vertebral artery with normal  patency. Bilateral SCA WNL.  Preoperative labs noted. Cr 1.15. H/H 11.6/38.7. INR 1.64, PTT 37. Glucose 100. Plan repeat PT/INR on arrival.   I updated anesthesiologist Dr. Hart Rochester about patient's history and PAT exam findings. He does have pre-operative input from both his cardiologist and pulmonologist. I don't see any PFTs results, but documented as "severe emphysema" by 2014 pulmonary notes. Patient feels at baseline without recent exacerbation. I advised that he should use his Duoneb and Symbicort on the morning of surgery.  I think that with his respiratory status, his anesthesiologist will likely want to avoid general anesthesia if possible. Dr. Hart Rochester thought a popliteal block with minimal sedation could be considered. Further evaluation by his assigned anesthesiologist on the day of surgery to discuss the definitive plan.  Velna [facility]/Anesthesiology Phone [phone] 10/06/2015 3:14 PM

## 2015-10-07 MED ORDER — CEFAZOLIN SODIUM-DEXTROSE 2-4 GM/100ML-% IV SOLN
2.0000 g | INTRAVENOUS | Status: AC
  Administered 2015-10-08: 2 g via INTRAVENOUS
  Filled 2015-10-07: qty 100

## 2015-10-07 MED ORDER — SODIUM CHLORIDE 0.9 % IV SOLN: INTRAVENOUS | Status: DC

## 2015-10-07 MED ORDER — CHLORHEXIDINE GLUCONATE 4 % EX LIQD: 60.0000 mL | Freq: Once | CUTANEOUS | Status: DC

## 2015-10-08 ENCOUNTER — Ambulatory Visit (HOSPITAL_COMMUNITY): Payer: Medicare Other | Admitting: Vascular Surgery

## 2015-10-08 ENCOUNTER — Encounter (HOSPITAL_COMMUNITY): Payer: Self-pay | Admitting: *Deleted

## 2015-10-08 ENCOUNTER — Ambulatory Visit (HOSPITAL_COMMUNITY): Payer: Medicare Other

## 2015-10-08 ENCOUNTER — Encounter (HOSPITAL_COMMUNITY)
Admission: RE | Disposition: A | Discharge status: Home / Self Care | Payer: Self-pay | Source: Ambulatory Visit | Attending: Orthopedic Surgery

## 2015-10-08 ENCOUNTER — Ambulatory Visit (HOSPITAL_COMMUNITY): Payer: Medicare Other | Admitting: Anesthesiology

## 2015-10-08 ENCOUNTER — Ambulatory Visit (HOSPITAL_COMMUNITY)
Admission: RE | Admit: 2015-10-08 | Discharge: 2015-10-08 | Disposition: A | Discharge status: Home / Self Care | Payer: Medicare Other | Source: Ambulatory Visit | Attending: Orthopedic Surgery | Admitting: Orthopedic Surgery

## 2015-10-08 DIAGNOSIS — Z951 Presence of aortocoronary bypass graft: Secondary | ICD-10-CM | POA: Diagnosis not present

## 2015-10-08 DIAGNOSIS — R0902 Hypoxemia: Secondary | ICD-10-CM | POA: Insufficient documentation

## 2015-10-08 DIAGNOSIS — Z888 Allergy status to other drugs, medicaments and biological substances status: Secondary | ICD-10-CM | POA: Insufficient documentation

## 2015-10-08 DIAGNOSIS — M869 Osteomyelitis, unspecified: Secondary | ICD-10-CM | POA: Insufficient documentation

## 2015-10-08 DIAGNOSIS — I2510 Atherosclerotic heart disease of native coronary artery without angina pectoris: Secondary | ICD-10-CM | POA: Diagnosis not present

## 2015-10-08 DIAGNOSIS — I714 Abdominal aortic aneurysm, without rupture: Secondary | ICD-10-CM | POA: Insufficient documentation

## 2015-10-08 DIAGNOSIS — Z79899 Other long term (current) drug therapy: Secondary | ICD-10-CM | POA: Insufficient documentation

## 2015-10-08 DIAGNOSIS — Z01818 Encounter for other preprocedural examination: Secondary | ICD-10-CM | POA: Diagnosis not present

## 2015-10-08 DIAGNOSIS — I739 Peripheral vascular disease, unspecified: Secondary | ICD-10-CM | POA: Insufficient documentation

## 2015-10-08 DIAGNOSIS — Z9981 Dependence on supplemental oxygen: Secondary | ICD-10-CM | POA: Insufficient documentation

## 2015-10-08 DIAGNOSIS — I482 Chronic atrial fibrillation: Secondary | ICD-10-CM | POA: Diagnosis not present

## 2015-10-08 DIAGNOSIS — F419 Anxiety disorder, unspecified: Secondary | ICD-10-CM | POA: Insufficient documentation

## 2015-10-08 DIAGNOSIS — J449 Chronic obstructive pulmonary disease, unspecified: Secondary | ICD-10-CM | POA: Insufficient documentation

## 2015-10-08 DIAGNOSIS — Z8249 Family history of ischemic heart disease and other diseases of the circulatory system: Secondary | ICD-10-CM | POA: Insufficient documentation

## 2015-10-08 DIAGNOSIS — R0602 Shortness of breath: Secondary | ICD-10-CM | POA: Insufficient documentation

## 2015-10-08 DIAGNOSIS — I110 Hypertensive heart disease with heart failure: Secondary | ICD-10-CM | POA: Insufficient documentation

## 2015-10-08 DIAGNOSIS — E669 Obesity, unspecified: Secondary | ICD-10-CM | POA: Insufficient documentation

## 2015-10-08 DIAGNOSIS — L97519 Non-pressure chronic ulcer of other part of right foot with unspecified severity: Secondary | ICD-10-CM | POA: Diagnosis not present

## 2015-10-08 DIAGNOSIS — Z9849 Cataract extraction status, unspecified eye: Secondary | ICD-10-CM | POA: Insufficient documentation

## 2015-10-08 DIAGNOSIS — J439 Emphysema, unspecified: Secondary | ICD-10-CM | POA: Diagnosis not present

## 2015-10-08 DIAGNOSIS — R0600 Dyspnea, unspecified: Secondary | ICD-10-CM | POA: Insufficient documentation

## 2015-10-08 DIAGNOSIS — I252 Old myocardial infarction: Secondary | ICD-10-CM | POA: Diagnosis not present

## 2015-10-08 DIAGNOSIS — L97419 Non-pressure chronic ulcer of right heel and midfoot with unspecified severity: Secondary | ICD-10-CM | POA: Diagnosis not present

## 2015-10-08 DIAGNOSIS — E785 Hyperlipidemia, unspecified: Secondary | ICD-10-CM | POA: Insufficient documentation

## 2015-10-08 DIAGNOSIS — Z87891 Personal history of nicotine dependence: Secondary | ICD-10-CM | POA: Insufficient documentation

## 2015-10-08 DIAGNOSIS — Z7982 Long term (current) use of aspirin: Secondary | ICD-10-CM | POA: Insufficient documentation

## 2015-10-08 DIAGNOSIS — Z6828 Body mass index (BMI) 28.0-28.9, adult: Secondary | ICD-10-CM | POA: Insufficient documentation

## 2015-10-08 DIAGNOSIS — I509 Heart failure, unspecified: Secondary | ICD-10-CM | POA: Insufficient documentation

## 2015-10-08 DIAGNOSIS — D649 Anemia, unspecified: Secondary | ICD-10-CM | POA: Insufficient documentation

## 2015-10-08 DIAGNOSIS — Z7901 Long term (current) use of anticoagulants: Secondary | ICD-10-CM | POA: Insufficient documentation

## 2015-10-08 DIAGNOSIS — I4891 Unspecified atrial fibrillation: Secondary | ICD-10-CM | POA: Diagnosis not present

## 2015-10-08 DIAGNOSIS — L97514 Non-pressure chronic ulcer of other part of right foot with necrosis of bone: Secondary | ICD-10-CM | POA: Diagnosis not present

## 2015-10-08 HISTORY — PX: AMPUTATION: SHX166

## 2015-10-08 HISTORY — PX: ACHILLES TENDON LENGTHENING: SHX6455

## 2015-10-08 LAB — PROTIME-INR
INR: 1.53 — AB (ref 0.00–1.49)
PROTHROMBIN TIME: 18.4 s — AB (ref 11.6–15.2)

## 2015-10-08 SURGERY — AMPUTATION, FOOT, RAY
Anesthesia: Monitor Anesthesia Care | Site: Foot | Laterality: Right

## 2015-10-08 MED ORDER — SENNA 8.6 MG PO TABS: 2.0000 | ORAL_TABLET | Freq: Two times a day (BID) | ORAL | Status: DC

## 2015-10-08 MED ORDER — BUPIVACAINE-EPINEPHRINE (PF) 0.5% -1:200000 IJ SOLN
INTRAMUSCULAR | Status: DC | PRN
  Administered 2015-10-08: 40 mL via PERINEURAL

## 2015-10-08 MED ORDER — DOCUSATE SODIUM 100 MG PO CAPS: 100.0000 mg | ORAL_CAPSULE | Freq: Two times a day (BID) | ORAL | Status: DC

## 2015-10-08 MED ORDER — FENTANYL CITRATE (PF) 250 MCG/5ML IJ SOLN
INTRAMUSCULAR | Status: AC
  Filled 2015-10-08: qty 5

## 2015-10-08 MED ORDER — MEPIVACAINE HCL (PF) 1 % IJ SOLN
INTRAMUSCULAR | Status: DC | PRN
  Administered 2015-10-08: 20 mL via PERINEURAL

## 2015-10-08 MED ORDER — OXYCODONE HCL 5 MG PO TABS: 5.0000 mg | ORAL_TABLET | ORAL | Status: DC | PRN

## 2015-10-08 MED ORDER — MEPERIDINE HCL 25 MG/ML IJ SOLN: 6.2500 mg | INTRAMUSCULAR | Status: DC | PRN

## 2015-10-08 MED ORDER — MIDAZOLAM HCL 2 MG/2ML IJ SOLN: 2.0000 mg | Freq: Once | INTRAMUSCULAR | Status: DC

## 2015-10-08 MED ORDER — PROPOFOL 10 MG/ML IV BOLUS
INTRAVENOUS | Status: AC
  Filled 2015-10-08: qty 20

## 2015-10-08 MED ORDER — 0.9 % SODIUM CHLORIDE (POUR BTL) OPTIME
TOPICAL | Status: DC | PRN
  Administered 2015-10-08: 1000 mL

## 2015-10-08 MED ORDER — FENTANYL CITRATE (PF) 100 MCG/2ML IJ SOLN
INTRAMUSCULAR | Status: AC
  Administered 2015-10-08: 50 ug
  Filled 2015-10-08: qty 2

## 2015-10-08 MED ORDER — MIDAZOLAM HCL 2 MG/2ML IJ SOLN
INTRAMUSCULAR | Status: AC
  Administered 2015-10-08: 1 mg
  Filled 2015-10-08: qty 2

## 2015-10-08 MED ORDER — HYDROMORPHONE HCL 1 MG/ML IJ SOLN: 0.2500 mg | INTRAMUSCULAR | Status: DC | PRN

## 2015-10-08 MED ORDER — FENTANYL CITRATE (PF) 100 MCG/2ML IJ SOLN: 100.0000 ug | Freq: Once | INTRAMUSCULAR | Status: DC

## 2015-10-08 MED ORDER — PROPOFOL 500 MG/50ML IV EMUL
INTRAVENOUS | Status: DC | PRN
  Administered 2015-10-08: 25 ug/kg/min via INTRAVENOUS

## 2015-10-08 MED ORDER — LACTATED RINGERS IV SOLN
INTRAVENOUS | Status: DC
  Administered 2015-10-08 (×3): via INTRAVENOUS

## 2015-10-08 SURGICAL SUPPLY — 46 items
BANDAGE ACE 4X5 VEL STRL LF (GAUZE/BANDAGES/DRESSINGS) ×6 IMPLANT
BLADE LONG MED 31MMX9MM (MISCELLANEOUS) ×1
BLADE LONG MED 31X9 (MISCELLANEOUS) ×2 IMPLANT
BNDG COHESIVE 4X5 TAN STRL (GAUZE/BANDAGES/DRESSINGS) IMPLANT
BNDG COHESIVE 6X5 TAN STRL LF (GAUZE/BANDAGES/DRESSINGS) IMPLANT
BNDG ESMARK 4X9 LF (GAUZE/BANDAGES/DRESSINGS) ×3 IMPLANT
CANISTER SUCT 3000ML PPV (MISCELLANEOUS) ×3 IMPLANT
CHLORAPREP W/TINT 26ML (MISCELLANEOUS) ×3 IMPLANT
CUFF TOURNIQUET SINGLE 34IN LL (TOURNIQUET CUFF) IMPLANT
CUFF TOURNIQUET SINGLE 44IN (TOURNIQUET CUFF) IMPLANT
DRAPE U-SHAPE 47X51 STRL (DRAPES) ×3 IMPLANT
DRSG ADAPTIC 3X8 NADH LF (GAUZE/BANDAGES/DRESSINGS) IMPLANT
DRSG MEPITEL 3X4 ME34 (GAUZE/BANDAGES/DRESSINGS) ×3 IMPLANT
ELECT REM PT RETURN 9FT ADLT (ELECTROSURGICAL) ×3
ELECTRODE REM PT RTRN 9FT ADLT (ELECTROSURGICAL) ×1 IMPLANT
GAUZE SPONGE 4X4 12PLY STRL (GAUZE/BANDAGES/DRESSINGS) ×3 IMPLANT
GLOVE BIO SURGEON STRL SZ8 (GLOVE) ×6 IMPLANT
GLOVE BIOGEL PI IND STRL 8 (GLOVE) ×2 IMPLANT
GLOVE BIOGEL PI INDICATOR 8 (GLOVE) ×4
GLOVE ECLIPSE 7.5 STRL STRAW (GLOVE) ×3 IMPLANT
GOWN STRL REUS W/ TWL LRG LVL3 (GOWN DISPOSABLE) ×1 IMPLANT
GOWN STRL REUS W/ TWL XL LVL3 (GOWN DISPOSABLE) ×2 IMPLANT
GOWN STRL REUS W/TWL LRG LVL3 (GOWN DISPOSABLE) ×2
GOWN STRL REUS W/TWL XL LVL3 (GOWN DISPOSABLE) ×4
KIT BASIN OR (CUSTOM PROCEDURE TRAY) ×3 IMPLANT
KIT ROOM TURNOVER OR (KITS) ×3 IMPLANT
NS IRRIG 1000ML POUR BTL (IV SOLUTION) ×3 IMPLANT
PACK ORTHO EXTREMITY (CUSTOM PROCEDURE TRAY) ×3 IMPLANT
PAD ABD 8X10 STRL (GAUZE/BANDAGES/DRESSINGS) ×6 IMPLANT
PAD ARMBOARD 7.5X6 YLW CONV (MISCELLANEOUS) ×6 IMPLANT
PAD CAST 4YDX4 CTTN HI CHSV (CAST SUPPLIES) ×1 IMPLANT
PADDING CAST COTTON 4X4 STRL (CAST SUPPLIES) ×2
SPECIMEN JAR SMALL (MISCELLANEOUS) ×3 IMPLANT
SPONGE GAUZE 4X4 12PLY STER LF (GAUZE/BANDAGES/DRESSINGS) ×3 IMPLANT
SPONGE LAP 18X18 X RAY DECT (DISPOSABLE) ×3 IMPLANT
STAPLER VISISTAT 35W (STAPLE) IMPLANT
STOCKINETTE IMPERVIOUS LG (DRAPES) IMPLANT
SUCTION FRAZIER HANDLE 10FR (MISCELLANEOUS) ×2
SUCTION TUBE FRAZIER 10FR DISP (MISCELLANEOUS) ×1 IMPLANT
SUT ETHILON 2 0 PSLX (SUTURE) ×6 IMPLANT
TOWEL OR 17X24 6PK STRL BLUE (TOWEL DISPOSABLE) ×3 IMPLANT
TOWEL OR 17X26 10 PK STRL BLUE (TOWEL DISPOSABLE) ×3 IMPLANT
TUBE CONNECTING 12'X1/4 (SUCTIONS) ×1
TUBE CONNECTING 12X1/4 (SUCTIONS) ×2 IMPLANT
UNDERPAD 30X30 INCONTINENT (UNDERPADS AND DIAPERS) ×3 IMPLANT
WATER STERILE IRR 1000ML POUR (IV SOLUTION) ×3 IMPLANT

## 2015-10-08 NOTE — Anesthesia Postprocedure Evaluation (Signed)
Anesthesia Post Note  Patient: Roberto Reilly  Procedure(s) Performed: Procedure(s) (LRB): RIGHT FOOT TRANSMET AMPUTATION (Right) ACHILLES TENDON LENGTHENING (Right)  Patient location during evaluation: PACU Anesthesia Type: MAC and Regional Level of consciousness: awake and alert Pain management: pain level controlled Vital Signs Assessment: post-procedure vital signs reviewed and stable Respiratory status: spontaneous breathing Cardiovascular status: stable Anesthetic complications: no    Last Vitals:  Filed Vitals:   10/08/15 1336 10/08/15 1349  BP: 135/62 123/75  Pulse: 90 88  Temp: 36.8 C   Resp: 18 18    Last Pain:  Filed Vitals:   10/08/15 1352  PainSc: 0-No pain                 Lewie LoronJohn Sanora Cunanan

## 2015-10-08 NOTE — Progress Notes (Signed)
Orthopedic Tech Progress Note Patient Details:  Roberto Reilly 09/18/1933 [id number]  Ortho Devices Type of Ortho Device: CAM walker Ortho Device/Splint Interventions: Ordered As ordered by Dr. Ann MakiHewitt  [name] 10/08/2015, 1:45 PM

## 2015-10-08 NOTE — Discharge Instructions (Signed)
Roberto ArthursJohn Hewitt, MD [facility]  Please read the following information regarding your care after surgery.  Medications  You only need a prescription for the narcotic pain medicine (ex. oxycodone, Percocet, Norco).  All of the other medicines listed below are available over the counter. X acetominophen (Tylenol) 650 mg every 4-6 hours as you need for minor pain X oxycodone as prescribed for moderate to severe pain ?   Narcotic pain medicine (ex. oxycodone, Percocet, Vicodin) will cause constipation.  To prevent this problem, take the following medicines while you are taking any pain medicine. X docusate sodium (Colace) 100 mg twice a day X senna (Senokot) 2 tablets twice a day  X To help prevent blood clots, resume your coumadin and aspirin as you would normally take it.  You should also get up every hour while you are awake to move around.    Weight Bearing X Bear weight when you are able on your operated leg or foot in CAM boot.   Cast / Splint / Dressing X Keep your dressing clean and dry.  Dont put anything (coat hanger, pencil, etc) down inside of it.  If it gets damp, use a hair dryer on the cool setting to dry it.  If it gets soaked, call the office to schedule an appointment for a cast change.   After your dressing, cast or splint is removed; you may shower, but do not soak or scrub the wound.  Allow the water to run over it, and then gently pat it dry.  Swelling It is normal for you to have swelling where you had surgery.  To reduce swelling and pain, keep your toes above your nose for at least 3 days after surgery.  It may be necessary to keep your foot or leg elevated for several weeks.  If it hurts, it should be elevated.  Follow Up Call my office at [phone] when you are discharged from the hospital or surgery center to schedule an appointment to be seen two weeks after surgery.  Call my office at [phone] if you develop a fever >101.5 F, nausea,  vomiting, bleeding from the surgical site or severe pain.

## 2015-10-08 NOTE — Anesthesia Preprocedure Evaluation (Addendum)
Anesthesia Evaluation  Patient identified by MRN, date of birth, ID band Patient awake    Reviewed: Allergy & Precautions, H&P , NPO status , Patient's Chart, lab work & pertinent test results  Airway Mallampati: II  TM Distance: >3 FB Neck ROM: Full    Dental no notable dental hx. (+) Dental Advisory Given   Pulmonary shortness of breath and with exertion, pneumonia, COPD,  COPD inhaler, former smoker,    + rhonchi    rales    Cardiovascular hypertension, Pt. on medications + CAD, + Past MI, + CABG and + Peripheral Vascular Disease  + dysrhythmias  Rhythm:Regular Rate:Normal     Neuro/Psych PSYCHIATRIC DISORDERS Anxiety Depression negative neurological ROS     GI/Hepatic negative GI ROS, Neg liver ROS,   Endo/Other  negative endocrine ROS  Renal/GU negative Renal ROS     Musculoskeletal negative musculoskeletal ROS (+)   Abdominal   Peds  Hematology  (+) Blood dyscrasia, anemia ,   Anesthesia Other Findings   Reproductive/Obstetrics                          Anesthesia Physical  Anesthesia Plan  ASA: IV  Anesthesia Plan: MAC and Regional   Post-op Pain Management:    Induction: Intravenous  Airway Management Planned:   Additional Equipment:   Intra-op Plan:   Post-operative Plan:   Informed Consent: I have reviewed the patients History and Physical, chart, labs and discussed the procedure including the risks, benefits and alternatives for the proposed anesthesia with the patient or authorized representative who has indicated his/her understanding and acceptance.   Dental advisory given  Plan Discussed with: CRNA  Anesthesia Plan Comments: (Pt with significant dyspnea. He states he gets DOE quite easily, but not normally SOB while at rest as he is today. I do hear rales in bilateral bases. Will obtain CXR. Discussed with Dr. Victorino DikeHewitt)     Anesthesia Quick Evaluation

## 2015-10-08 NOTE — H&P (Signed)
Roberto Reilly is an 82 y.o. male.   Chief Complaint: right forefoot ulcer HPI: 82 y/o male with PMH of CAD, Afib and COPD presents today for R transmet amputation and heelcord lengthening.  Past Medical History  Diagnosis Date  . History of non-ST Elevation MI (myocardial infarction) March 2007    Admitted with COPD exacerbation, given by CHF. Had some chest pain with positive troponins. Cath showed multivessel disease, referred for CABG  . CAD in native artery March 2007    Cath for dyspnea on exertion: 75% distal LM, 85% RI, 95% mid-distal Cx, in multiple RCA lesions with 95% distal. --> Referred for CABG; has declined further noninvasive evaluation in the absence of worsening symptoms  . S/P CABG x 24 July 2005    LIMA-LAD, SVG-OM, seq SVG-PDA -PLB  . Atrial fibrillation, chronic (HCC)     Anticoagulated on warfarin. Stable -- post op  . H/O echocardiogram March ; January 2015    a) Normal LV size and cavity appeared normal function. Grade 1 diastolic dysfunction. Limited study.; b) normal LV size and function with EF 60-65%. Aortic sclerosis without stenosis, mildly increased PA pressures of but normal IVC and RA/RV size  . Obesity (BMI 30-39.9)     One year ago weighed 265 pounds, (12/2012) -- now 234 pounds  . Dyslipidemia, goal LDL below 70   . COPD (chronic obstructive pulmonary disease) with emphysema (HCC) 05/23/2007    Hosp 5/29-6/01/12- COPD exacerbation ONOX 12/08/10- desaturated to less than 88% for over an hour, qualifying for home O2 during sleep    . Hypoxia      chronic, on home O2  . History of pneumonia   . Seasonal allergies   . Diverticulosis of colon with hemorrhage 05/24/2013  . Hypertension   . Shortness of breath dyspnea   . Pneumonia     hx  . Myocardial infarction (HCC)   . Anxiety   . AAA (abdominal aortic aneurysm) (HCC)     4.2 X 4.8 cm 04/2013 CT; declined re-eval 02/25/15    Past Surgical History  Procedure Laterality Date  . Cataract  extraction      Laser  . Rll resection for hamartoma      lungs  . Rul for hamartoma      lung  . Coronary artery bypass graft    . Cardiac catheterization  03 28 2007    NORMAL LV FUNCTION/ ABDOMINAL AORTA STENOSIS,75%-85%. RIGHT FEMORAL ARTERY :CATHETERS USED A  4-FRENCH WITH A 4-FRENCH SHEATH  . Colonoscopy N/A 05/24/2013    Procedure: COLONOSCOPY;  Surgeon: Iva Boop, MD;  Location: [facility];  Service: Endoscopy;  Laterality: N/A;    Family History  Problem Relation Age of Onset  . Heart attack Mother   . Heart attack Father    Social History:  reports that he quit smoking about 17 years ago. His smoking use included Cigarettes. He has a 110 pack-year smoking history. He has never used smokeless tobacco. He reports that he does not drink alcohol or use illicit drugs.  Allergies:  Allergies  Allergen Reactions  . Prednisone Other (See Comments)    Makes pt jittery and nervous    Medications Prior to Admission  Medication Sig Dispense Refill  . amLODipine (NORVASC) 10 MG tablet Take 1 tablet (10 mg total) by mouth daily. 30 tablet 1  . aspirin 81 MG tablet Take 81 mg by mouth daily.      . budesonide-formoterol (SYMBICORT) 160-4.5 MCG/ACT inhaler  Inhale 2 puffs into the lungs 2 (two) times daily. 2 Inhaler 0  . cholecalciferol (VITAMIN D) 1000 UNITS tablet Take 1,000 Units by mouth daily.    . furosemide (LASIX) 40 MG tablet TAKE ONE TABLET BY MOUTH EVERY MORNING 90 tablet 0  . ipratropium-albuterol (DUONEB) 0.5-2.5 (3) MG/3ML SOLN Take 3 mLs by nebulization every 6 (six) hours as needed (wheezing and shortness of breath).     Marland Kitchen. KLOR-CON M20 20 MEQ tablet TAKE 1 TABLET (20 MEQ TOTAL) BY MOUTH DAILY. 30 tablet 10  . sertraline (ZOLOFT) 50 MG tablet Take 50 mg by mouth daily.    . simvastatin (ZOCOR) 20 MG tablet Take 20 mg by mouth daily.    Marland Kitchen. warfarin (COUMADIN) 10 MG tablet Take 10 mg by mouth as directed. Take 2 tablets (20mg ) on Tuesday & Thursday all other days take  1 tablet (10mg )    . warfarin (COUMADIN) 2.5 MG tablet TAKE 1-2 TABLETS BY MOUTH DAILY AS DIRECTED BY COUMADIN CLINIC (Patient not taking: Reported on 10/06/2015) 45 tablet 5    Results for orders placed or performed during the hospital encounter of 10/08/15 (from the past 48 hour(s))  Protime-INR     Status: Abnormal   Collection Time: 10/08/15  9:35 AM  Result Value Ref Range   Prothrombin Time 18.4 (H) 11.6 - 15.2 seconds   INR 1.53 (H) 0.00 - 1.49   Dg Chest 2 View  10/08/2015  CLINICAL DATA:  82 year old male with COPD. Preoperative chest x-ray. Current fever. EXAM: CHEST  2 VIEW COMPARISON:  Multiple prior chest x-ray, most recent 05/20/2013, 05/07/2013 FINDINGS: Cardiomediastinal silhouette unchanged in size and contour, with cardiomegaly. Tortuosity descending thoracic aorta. Double density of the lower mediastinum, new from the comparison. Surgical changes of prior median sternotomy and CABG. Stigmata of emphysema, with increased retrosternal airspace, flattened hemidiaphragms, increased AP diameter, and hyperinflation on the AP view. No pleural effusion or pneumothorax. Persistent opacity at the right lung base with blunting of the right costophrenic angle and partial obscuration the right hemidiaphragm, status post right lower lobe resection/lobectomy. No displaced fracture.  Accentuated kyphotic curvature. IMPRESSION: Chronic changes at the base of the right lung, similar to comparison, most likely postoperative with no new confluent airspace disease. Changes of emphysema with no pleural effusion. Surgical changes of prior median sternotomy and CABG. Signed, Yvone NeuJaime S. Loreta AveWagner, DO [facility] Electronically Signed   By: Gilmer MorJaime  Wagner D.O.   On: 10/08/2015 10:11    ROS  No recent f/cn//v/wt loss.  + SOB today.    Blood pressure 159/90, pulse 103, temperature 98.4 F (36.9 C), temperature source Oral, resp. rate 20, height 6' (1.829  m), weight 96.361 kg (212 lb 7 oz), SpO2 100 %. Physical Exam  Elderly male in nad.  HOH.  A and O x 4.  Mood and affect normal.  EOMI.  resp mildly labored.  R forefoot with large plantar ulcer.  No signs of infection.  No lympahenopathy.  5/5 strength in PF and DF of the ankle and toes.  Assessment/Plan R forefoot chronic nonhealing ulcer and tight heelcord.  To OR for right perc achilles tenotomy and transmet amputation.  The risks and benefits of the alternative treatment options have been discussed in detail.  The patient wishes to proceed with surgery and specifically understands risks of bleeding, infection, nerve damage, blood clots, need for additional surgery, amputation and death.   Toni ArthursHEWITT, Francetta Ilg, MD 10/08/2015, 11:37 AM

## 2015-10-08 NOTE — Anesthesia Procedure Notes (Addendum)
Anesthesia Regional Block:  Popliteal block  Pre-Anesthetic Checklist: ,, timeout performed, Correct Patient, Correct Site, Correct Laterality, Correct Procedure, Correct Position, site marked, Risks and benefits discussed, Surgical consent,  Pre-op evaluation,  Post-op pain management  Laterality: Right  Prep: chloraprep       Needles:  Injection technique: Single-shot  Needle Type: Stimiplex     Needle Length: 10cm 10 cm Needle Gauge: 21 and 21 G    Additional Needles:  Procedures: ultrasound guided (picture in chart) and nerve stimulator  Motor weakness within 5 minutes. Popliteal block  Nerve Stimulator or Paresthesia:  Response: Plantar flexion/toe flexion, 0.8 mA,   Additional Responses:   Narrative:  Injection made incrementally with aspirations every 5 mL.  Performed by: Personally  Anesthesiologist: Lewie LoronGERMEROTH, JOHN  Additional Notes: Nerve located and needle positioned with direct ultrasound guidance. Good perineural spread. Patient tolerated well.   Anesthesia Regional Block:  Adductor canal block  Pre-Anesthetic Checklist: ,, timeout performed, Correct Patient, Correct Site, Correct Laterality, Correct Procedure, Correct Position, site marked, Risks and benefits discussed, Surgical consent,  Pre-op evaluation,  Post-op pain management  Laterality: Right  Prep: chloraprep       Needles:  Injection technique: Single-shot  Needle Type: Stimiplex     Needle Length: 9cm 9 cm Needle Gauge: 21 and 21 G    Additional Needles:  Procedures: ultrasound guided (picture in chart) Adductor canal block Narrative:  Injection made incrementally with aspirations every 5 mL.  Performed by: Personally  Anesthesiologist: Lewie LoronGERMEROTH, JOHN  Additional Notes: BP cuff, EKG monitors applied. Sedation begun. Artery and nerve location verified with U/S and anesthetic injected incrementally, slowly, and after negative aspirations under direct u/s guidance. Good  fascial /perineural spread. Tolerated well.   Procedure Name: MAC Date/Time: 10/08/2015 12:00 PM Performed by: Fransisca KaufmannMEYER, [place] Wainwright E Pre-anesthesia Checklist: Patient identified, Emergency Drugs available, Suction available and Patient being monitored Oxygen Delivery Method: Simple face mask Placement Confirmation: positive ETCO2 and breath sounds checked- equal and bilateral

## 2015-10-08 NOTE — Brief Op Note (Signed)
10/08/2015  12:46 PM  PATIENT:  Roberto Reilly  82 y.o. male  PRE-OPERATIVE DIAGNOSIS:  RIGHT FORE FOOT ULCERS AND OSTEOMYLITIS   POST-OPERATIVE DIAGNOSIS:  right foot ulcer and osteomylitis  Procedure(s): 1.  RIGHT FOOT TRANSMET AMPUTATION 2.  Right percutaneous ACHILLES TENDON LENGTHENING  SURGEON:  Toni ArthursJohn Mika Anastasi, MD  ASSISTANT: Alfredo MartinezJustin Ollis, PA-C  ANESTHESIA:   MAC, regional  EBL:  minimal   TOURNIQUET:   Total Tourniquet Time Documented: area (laterality) - 22 minutes Total: area (laterality) - 22 minutes  COMPLICATIONS:  None apparent  DISPOSITION:  Extubated, awake and stable to recovery.  DICTATION ID:  [id number]

## 2015-10-08 NOTE — Transfer of Care (Signed)
Immediate Anesthesia Transfer of Care Note  Patient: Karna ChristmasJames Bova  Procedure(s) Performed: Procedure(s): RIGHT FOOT TRANSMET AMPUTATION (Right) ACHILLES TENDON LENGTHENING (Right)  Patient Location: PACU  Anesthesia Type:MAC and Regional  Level of Consciousness: awake, alert , oriented and sedated  Airway & Oxygen Therapy: Patient Spontanous Breathing and Patient connected to nasal cannula oxygen  Post-op Assessment: Report given to RN, Post -op Vital signs reviewed and stable and Patient moving all extremities  Post vital signs: Reviewed and stable  Last Vitals:  Filed Vitals:   10/08/15 0921  BP: 159/90  Pulse: 103  Temp: 36.9 C  Resp: 20    Last Pain: There were no vitals filed for this visit.    Patients Stated Pain Goal: 5 (10/08/15 0925)  Complications: No apparent anesthesia complications

## 2015-10-09 ENCOUNTER — Encounter (HOSPITAL_COMMUNITY): Payer: Self-pay | Admitting: Orthopedic Surgery

## 2015-10-09 NOTE — Op Note (Signed)
NAME:  Roberto Reilly, Roberto Reilly               ACCOUNT NO.:  [id number]  MEDICAL RECORD NO.:  [id number]  LOCATION:  [facility]                         FACILITY:  [facility]  PHYSICIAN:  Toni ArthursJohn Sabrena Gavitt, MD        DATE OF BIRTH:  11-13-1933  DATE OF PROCEDURE:  10/08/2015 DATE OF DISCHARGE:  10/08/2015                              OPERATIVE REPORT   PREOPERATIVE DIAGNOSIS:  Right forefoot chronic nonhealing ulcer and osteomyelitis.  POSTOPERATIVE DIAGNOSIS:  Right forefoot chronic nonhealing ulcer and osteomyelitis.  PROCEDURE: 1. Right percutaneous Achilles tendon lengthening. 2. Right foot transmetatarsal amputation through a separate incision.  SURGEON:  Toni ArthursJohn Jochebed Bills, MD.  ASSISTANT:  Alfredo MartinezJustin Ollis, PA-C.  ANESTHESIA:  Regional, MAC.  ESTIMATED BLOOD LOSS:  Minimal.  TOURNIQUET TIME:  22 minutes with an ankle Esmarch.  COMPLICATIONS:  None apparent.  DISPOSITION:  Extubated, awake, and stable to recovery.  INDICATION FOR PROCEDURE:  The patient is a 82 year old male with past medical history significant for coronary artery disease and COPD.  He has a nonhealing ulcer at the right forefoot as well as underlying osteomyelitis.  He presents today for an Achilles tendon lengthening and transmetatarsal amputation.  He understands the risks and benefits, the alternative treatment options, and elects surgical treatment.  He specifically understands risks of bleeding, infection, nerve damage, blood clots, need for additional surgery, continued pain, nonunion, amputation, and death.  PROCEDURE IN DETAIL:  After preoperative consent was obtained and the correct operative site was identified, the patient was brought to the operating room and placed supine on the operating table.  IV sedation was administered and regional anesthesia had previously been administered.  The right lower extremity was prepped and draped in standard sterile fashion.  A triple hemi-section percutaneous Achilles tendon  lengthening was then performed.  The ankle was then dorsiflexed approximately 30 degrees with the knee extended.  The foot was exsanguinated and a 4-inch Esmarch tourniquet was wrapped around the ankle.  A fishmouth incision was then marked on the skin at the level of the metatarsals.  The incision was made.  Sharp dissection was carried down through skin and subcutaneous tissue to the level of the metatarsal shafts.  Subperiosteal dissection was carried proximally. An oscillating saw was then used to cut through the metatarsal shafts bevelling the cuts plantarward.  The plantar soft tissues were then divided sharply, and the forefoot was passed off the field.  The neurovascular bundles were cauterized.  The cut surfaces of bone were smoothed with the saw.  Wound was irrigated copiously.  Incision was then closed with simple and horizontal mattress sutures of 2-0 nylon. Sterile dressings were applied followed by a compression wrap and a CAM walker boot.  Tourniquet was released after application of the dressings at 22 minutes.  The patient was awakened from anesthesia and transported to the recovery room in stable condition.  FOLLOWUP PLAN:  The patient will be weightbearing as tolerated on the right lower extremity in a CAM walker boot.  He will resume his regular medications and follow up with me in the office in 2 weeks for a wound check.  Alfredo MartinezJustin Ollis, PA-C, was present and scrubbed for the duration of  the case.  His assistance was essential in positioning the patient, prepping and draping, gaining and maintaining exposure, performing the operation, closing and dressing the wounds, and applying the CAM boot.     Toni Arthurs, MD     JH/MEDQ  D:  10/08/2015  T:  10/09/2015  Job:  [id number]

## 2015-10-16 ENCOUNTER — Ambulatory Visit: Payer: Medicare Other | Admitting: Pharmacist Clinician (PhC)/ Clinical Pharmacy Specialist

## 2015-10-26 ENCOUNTER — Ambulatory Visit: Payer: Medicare Other

## 2015-10-26 ENCOUNTER — Ambulatory Visit: Payer: Self-pay

## 2015-10-28 DIAGNOSIS — Z4789 Encounter for other orthopedic aftercare: Secondary | ICD-10-CM | POA: Diagnosis not present

## 2015-10-30 ENCOUNTER — Ambulatory Visit (INDEPENDENT_AMBULATORY_CARE_PROVIDER_SITE_OTHER): Payer: Medicare Other | Admitting: Pharmacist

## 2015-10-30 DIAGNOSIS — I4891 Unspecified atrial fibrillation: Secondary | ICD-10-CM | POA: Diagnosis not present

## 2015-10-30 DIAGNOSIS — Z7901 Long term (current) use of anticoagulants: Secondary | ICD-10-CM | POA: Diagnosis not present

## 2015-10-30 LAB — POCT INR: INR: 3.4

## 2015-11-01 ENCOUNTER — Encounter (HOSPITAL_COMMUNITY): Payer: Self-pay | Admitting: Emergency Medicine

## 2015-11-01 ENCOUNTER — Emergency Department (HOSPITAL_COMMUNITY): Payer: Medicare Other

## 2015-11-01 ENCOUNTER — Inpatient Hospital Stay (HOSPITAL_COMMUNITY)
Admission: EM | Admit: 2015-11-01 | Discharge: 2015-11-05 | DRG: 871 | Disposition: A | Discharge status: Home / Self Care | Payer: Medicare Other | Attending: Internal Medicine | Admitting: Internal Medicine

## 2015-11-01 DIAGNOSIS — E785 Hyperlipidemia, unspecified: Secondary | ICD-10-CM | POA: Diagnosis present

## 2015-11-01 DIAGNOSIS — Z888 Allergy status to other drugs, medicaments and biological substances status: Secondary | ICD-10-CM | POA: Diagnosis not present

## 2015-11-01 DIAGNOSIS — Z7982 Long term (current) use of aspirin: Secondary | ICD-10-CM

## 2015-11-01 DIAGNOSIS — Z6825 Body mass index (BMI) 25.0-25.9, adult: Secondary | ICD-10-CM

## 2015-11-01 DIAGNOSIS — Z951 Presence of aortocoronary bypass graft: Secondary | ICD-10-CM | POA: Diagnosis not present

## 2015-11-01 DIAGNOSIS — J440 Chronic obstructive pulmonary disease with acute lower respiratory infection: Secondary | ICD-10-CM | POA: Diagnosis present

## 2015-11-01 DIAGNOSIS — R509 Fever, unspecified: Secondary | ICD-10-CM | POA: Diagnosis not present

## 2015-11-01 DIAGNOSIS — Z66 Do not resuscitate: Secondary | ICD-10-CM | POA: Diagnosis present

## 2015-11-01 DIAGNOSIS — J9621 Acute and chronic respiratory failure with hypoxia: Secondary | ICD-10-CM | POA: Diagnosis not present

## 2015-11-01 DIAGNOSIS — Z89421 Acquired absence of other right toe(s): Secondary | ICD-10-CM | POA: Diagnosis not present

## 2015-11-01 DIAGNOSIS — Z8249 Family history of ischemic heart disease and other diseases of the circulatory system: Secondary | ICD-10-CM | POA: Diagnosis not present

## 2015-11-01 DIAGNOSIS — J189 Pneumonia, unspecified organism: Secondary | ICD-10-CM | POA: Diagnosis not present

## 2015-11-01 DIAGNOSIS — I252 Old myocardial infarction: Secondary | ICD-10-CM | POA: Diagnosis not present

## 2015-11-01 DIAGNOSIS — J181 Lobar pneumonia, unspecified organism: Secondary | ICD-10-CM

## 2015-11-01 DIAGNOSIS — J90 Pleural effusion, not elsewhere classified: Secondary | ICD-10-CM | POA: Diagnosis present

## 2015-11-01 DIAGNOSIS — A419 Sepsis, unspecified organism: Secondary | ICD-10-CM | POA: Diagnosis present

## 2015-11-01 DIAGNOSIS — Z7984 Long term (current) use of oral hypoglycemic drugs: Secondary | ICD-10-CM | POA: Diagnosis not present

## 2015-11-01 DIAGNOSIS — J14 Pneumonia due to Hemophilus influenzae: Secondary | ICD-10-CM | POA: Diagnosis present

## 2015-11-01 DIAGNOSIS — J9601 Acute respiratory failure with hypoxia: Secondary | ICD-10-CM

## 2015-11-01 DIAGNOSIS — N182 Chronic kidney disease, stage 2 (mild): Secondary | ICD-10-CM | POA: Diagnosis present

## 2015-11-01 DIAGNOSIS — F419 Anxiety disorder, unspecified: Secondary | ICD-10-CM | POA: Diagnosis present

## 2015-11-01 DIAGNOSIS — Z9981 Dependence on supplemental oxygen: Secondary | ICD-10-CM | POA: Diagnosis not present

## 2015-11-01 DIAGNOSIS — R0689 Other abnormalities of breathing: Secondary | ICD-10-CM

## 2015-11-01 DIAGNOSIS — I480 Paroxysmal atrial fibrillation: Secondary | ICD-10-CM | POA: Diagnosis present

## 2015-11-01 DIAGNOSIS — J449 Chronic obstructive pulmonary disease, unspecified: Secondary | ICD-10-CM | POA: Diagnosis present

## 2015-11-01 DIAGNOSIS — Z87891 Personal history of nicotine dependence: Secondary | ICD-10-CM | POA: Diagnosis not present

## 2015-11-01 DIAGNOSIS — E669 Obesity, unspecified: Secondary | ICD-10-CM | POA: Diagnosis present

## 2015-11-01 DIAGNOSIS — I2510 Atherosclerotic heart disease of native coronary artery without angina pectoris: Secondary | ICD-10-CM | POA: Diagnosis present

## 2015-11-01 DIAGNOSIS — I714 Abdominal aortic aneurysm, without rupture: Secondary | ICD-10-CM | POA: Diagnosis present

## 2015-11-01 DIAGNOSIS — J441 Chronic obstructive pulmonary disease with (acute) exacerbation: Secondary | ICD-10-CM | POA: Diagnosis present

## 2015-11-01 DIAGNOSIS — R0600 Dyspnea, unspecified: Secondary | ICD-10-CM

## 2015-11-01 DIAGNOSIS — R778 Other specified abnormalities of plasma proteins: Secondary | ICD-10-CM | POA: Diagnosis present

## 2015-11-01 DIAGNOSIS — Z79899 Other long term (current) drug therapy: Secondary | ICD-10-CM | POA: Diagnosis not present

## 2015-11-01 DIAGNOSIS — I10 Essential (primary) hypertension: Secondary | ICD-10-CM | POA: Diagnosis not present

## 2015-11-01 DIAGNOSIS — N179 Acute kidney failure, unspecified: Secondary | ICD-10-CM | POA: Diagnosis present

## 2015-11-01 DIAGNOSIS — I482 Chronic atrial fibrillation: Secondary | ICD-10-CM | POA: Diagnosis present

## 2015-11-01 DIAGNOSIS — R0602 Shortness of breath: Secondary | ICD-10-CM | POA: Diagnosis not present

## 2015-11-01 DIAGNOSIS — R05 Cough: Secondary | ICD-10-CM | POA: Diagnosis not present

## 2015-11-01 DIAGNOSIS — I129 Hypertensive chronic kidney disease with stage 1 through stage 4 chronic kidney disease, or unspecified chronic kidney disease: Secondary | ICD-10-CM | POA: Diagnosis present

## 2015-11-01 DIAGNOSIS — Z7951 Long term (current) use of inhaled steroids: Secondary | ICD-10-CM

## 2015-11-01 DIAGNOSIS — R7989 Other specified abnormal findings of blood chemistry: Secondary | ICD-10-CM

## 2015-11-01 DIAGNOSIS — J9620 Acute and chronic respiratory failure, unspecified whether with hypoxia or hypercapnia: Secondary | ICD-10-CM

## 2015-11-01 DIAGNOSIS — E875 Hyperkalemia: Secondary | ICD-10-CM | POA: Diagnosis present

## 2015-11-01 HISTORY — DX: Paroxysmal atrial fibrillation: I48.0

## 2015-11-01 LAB — CBC WITH DIFFERENTIAL/PLATELET
BASOS ABS: 0 10*3/uL (ref 0.0–0.1)
BASOS PCT: 0 %
EOS ABS: 0 10*3/uL (ref 0.0–0.7)
EOS PCT: 0 %
HCT: 42.6 % (ref 39.0–52.0)
Hemoglobin: 13.1 g/dL (ref 13.0–17.0)
LYMPHS PCT: 3 %
Lymphs Abs: 0.3 10*3/uL — ABNORMAL LOW (ref 0.7–4.0)
MCH: 24.8 pg — ABNORMAL LOW (ref 26.0–34.0)
MCHC: 30.8 g/dL (ref 30.0–36.0)
MCV: 80.7 fL (ref 78.0–100.0)
Monocytes Absolute: 1.7 10*3/uL — ABNORMAL HIGH (ref 0.1–1.0)
Monocytes Relative: 16 %
Neutro Abs: 8.4 10*3/uL — ABNORMAL HIGH (ref 1.7–7.7)
Neutrophils Relative %: 81 %
PLATELETS: 188 10*3/uL (ref 150–400)
RBC: 5.28 MIL/uL (ref 4.22–5.81)
RDW: 18.4 % — AB (ref 11.5–15.5)
WBC: 10.5 10*3/uL (ref 4.0–10.5)

## 2015-11-01 LAB — COMPREHENSIVE METABOLIC PANEL
ALK PHOS: 69 U/L (ref 38–126)
ALT: 15 U/L — ABNORMAL LOW (ref 17–63)
AST: 20 U/L (ref 15–41)
Albumin: 3.4 g/dL — ABNORMAL LOW (ref 3.5–5.0)
Anion gap: 10 (ref 5–15)
BILIRUBIN TOTAL: 1.2 mg/dL (ref 0.3–1.2)
BUN: 26 mg/dL — AB (ref 6–20)
CALCIUM: 9 mg/dL (ref 8.9–10.3)
CO2: 24 mmol/L (ref 22–32)
CREATININE: 1.73 mg/dL — AB (ref 0.61–1.24)
Chloride: 101 mmol/L (ref 101–111)
GFR calc Af Amer: 41 mL/min — ABNORMAL LOW (ref >60)
GFR, EST NON AFRICAN AMERICAN: 36 mL/min — AB (ref >60)
Glucose, Bld: 172 mg/dL — ABNORMAL HIGH (ref 65–99)
POTASSIUM: 4.6 mmol/L (ref 3.5–5.1)
Sodium: 135 mmol/L (ref 135–145)
TOTAL PROTEIN: 7.3 g/dL (ref 6.5–8.1)

## 2015-11-01 LAB — TROPONIN I
TROPONIN I: 0.09 ng/mL — AB (ref <0.031)
TROPONIN I: 0.08 ng/mL — AB (ref <0.031)

## 2015-11-01 LAB — I-STAT ARTERIAL BLOOD GAS, ED
Bicarbonate: 24.8 mEq/L — ABNORMAL HIGH (ref 20.0–24.0)
O2 SAT: 89 %
TCO2: 26 mmol/L (ref 0–100)
pCO2 arterial: 40.3 mmHg (ref 35.0–45.0)
pH, Arterial: 7.397 (ref 7.350–7.450)
pO2, Arterial: 57 mmHg — ABNORMAL LOW (ref 80.0–100.0)

## 2015-11-01 LAB — BRAIN NATRIURETIC PEPTIDE: B NATRIURETIC PEPTIDE 5: 399 pg/mL — AB (ref 0.0–100.0)

## 2015-11-01 LAB — I-STAT TROPONIN, ED: TROPONIN I, POC: 0.02 ng/mL (ref 0.00–0.08)

## 2015-11-01 LAB — I-STAT CG4 LACTIC ACID, ED
Lactic Acid, Venous: 1.78 mmol/L (ref 0.5–2.0)
Lactic Acid, Venous: 1.24 mmol/L (ref 0.5–2.0)

## 2015-11-01 MED ORDER — ASPIRIN 300 MG RE SUPP
300.0000 mg | Freq: Once | RECTAL | Status: AC
  Administered 2015-11-01: 300 mg via RECTAL
  Filled 2015-11-01: qty 1

## 2015-11-01 MED ORDER — HYDROCODONE-ACETAMINOPHEN 5-325 MG PO TABS
1.0000 | ORAL_TABLET | ORAL | Status: DC | PRN
  Administered 2015-11-01: 2 via ORAL
  Administered 2015-11-02: 1 via ORAL
  Filled 2015-11-01: qty 1
  Filled 2015-11-01: qty 2

## 2015-11-01 MED ORDER — SODIUM CHLORIDE 0.9 % IV SOLN
INTRAVENOUS | Status: AC
  Administered 2015-11-01: 23:00:00 via INTRAVENOUS

## 2015-11-01 MED ORDER — ACETAMINOPHEN 325 MG PO TABS: 650.0000 mg | ORAL_TABLET | Freq: Four times a day (QID) | ORAL | Status: DC | PRN

## 2015-11-01 MED ORDER — METHYLPREDNISOLONE SODIUM SUCC 125 MG IJ SOLR
125.0000 mg | Freq: Once | INTRAMUSCULAR | Status: AC
  Administered 2015-11-01: 125 mg via INTRAVENOUS
  Filled 2015-11-01: qty 2

## 2015-11-01 MED ORDER — AMLODIPINE BESYLATE 10 MG PO TABS
10.0000 mg | ORAL_TABLET | Freq: Every day | ORAL | Status: DC
  Administered 2015-11-02 – 2015-11-05 (×4): 10 mg via ORAL
  Filled 2015-11-01 (×4): qty 1

## 2015-11-01 MED ORDER — DEXTROSE 5 % IV SOLN
500.0000 mg | INTRAVENOUS | Status: DC
  Filled 2015-11-01: qty 500

## 2015-11-01 MED ORDER — SODIUM CHLORIDE 0.9% FLUSH
3.0000 mL | Freq: Two times a day (BID) | INTRAVENOUS | Status: DC
  Administered 2015-11-01 – 2015-11-05 (×8): 3 mL via INTRAVENOUS

## 2015-11-01 MED ORDER — SIMVASTATIN 20 MG PO TABS
20.0000 mg | ORAL_TABLET | Freq: Every day | ORAL | Status: DC
  Administered 2015-11-02 – 2015-11-05 (×4): 20 mg via ORAL
  Filled 2015-11-01 (×4): qty 1

## 2015-11-01 MED ORDER — METHYLPREDNISOLONE SODIUM SUCC 125 MG IJ SOLR
60.0000 mg | Freq: Two times a day (BID) | INTRAMUSCULAR | Status: DC
  Administered 2015-11-02 – 2015-11-04 (×5): 60 mg via INTRAVENOUS
  Filled 2015-11-01 (×5): qty 2

## 2015-11-01 MED ORDER — WARFARIN - PHARMACIST DOSING INPATIENT: Freq: Every day | Status: DC

## 2015-11-01 MED ORDER — ONDANSETRON HCL 4 MG PO TABS
4.0000 mg | ORAL_TABLET | Freq: Four times a day (QID) | ORAL | Status: DC | PRN
  Administered 2015-11-02: 4 mg via ORAL
  Filled 2015-11-01: qty 1

## 2015-11-01 MED ORDER — ACETAMINOPHEN 650 MG RE SUPP: 650.0000 mg | Freq: Four times a day (QID) | RECTAL | Status: DC | PRN

## 2015-11-01 MED ORDER — DEXTROSE 5 % IV SOLN
500.0000 mg | Freq: Once | INTRAVENOUS | Status: AC
  Administered 2015-11-01: 500 mg via INTRAVENOUS
  Filled 2015-11-01: qty 500

## 2015-11-01 MED ORDER — ACETAMINOPHEN 650 MG RE SUPP
650.0000 mg | Freq: Once | RECTAL | Status: AC
  Administered 2015-11-01: 650 mg via RECTAL
  Filled 2015-11-01: qty 1

## 2015-11-01 MED ORDER — ACETAMINOPHEN 500 MG PO TABS
1000.0000 mg | ORAL_TABLET | Freq: Once | ORAL | Status: DC
  Filled 2015-11-01: qty 2

## 2015-11-01 MED ORDER — LEVALBUTEROL HCL 0.63 MG/3ML IN NEBU
0.6300 mg | INHALATION_SOLUTION | RESPIRATORY_TRACT | Status: DC | PRN
  Filled 2015-11-01: qty 3

## 2015-11-01 MED ORDER — IPRATROPIUM-ALBUTEROL 0.5-2.5 (3) MG/3ML IN SOLN
3.0000 mL | Freq: Four times a day (QID) | RESPIRATORY_TRACT | Status: DC
  Administered 2015-11-02: 3 mL via RESPIRATORY_TRACT
  Filled 2015-11-01: qty 3

## 2015-11-01 MED ORDER — ONDANSETRON HCL 4 MG/2ML IJ SOLN: 4.0000 mg | Freq: Four times a day (QID) | INTRAMUSCULAR | Status: DC | PRN

## 2015-11-01 MED ORDER — GUAIFENESIN ER 600 MG PO TB12
600.0000 mg | ORAL_TABLET | Freq: Two times a day (BID) | ORAL | Status: DC
  Administered 2015-11-01 – 2015-11-05 (×8): 600 mg via ORAL
  Filled 2015-11-01 (×8): qty 1

## 2015-11-01 MED ORDER — DEXTROSE 5 % IV SOLN
1.0000 g | INTRAVENOUS | Status: DC
  Filled 2015-11-01: qty 10

## 2015-11-01 MED ORDER — MOMETASONE FURO-FORMOTEROL FUM 200-5 MCG/ACT IN AERO
2.0000 | INHALATION_SPRAY | Freq: Two times a day (BID) | RESPIRATORY_TRACT | Status: DC
  Administered 2015-11-02 – 2015-11-03 (×3): 2 via RESPIRATORY_TRACT
  Filled 2015-11-01 (×2): qty 8.8

## 2015-11-01 MED ORDER — SERTRALINE HCL 50 MG PO TABS
50.0000 mg | ORAL_TABLET | Freq: Every day | ORAL | Status: DC
  Administered 2015-11-02 – 2015-11-05 (×4): 50 mg via ORAL
  Filled 2015-11-01 (×4): qty 1

## 2015-11-01 MED ORDER — ASPIRIN EC 81 MG PO TBEC
81.0000 mg | DELAYED_RELEASE_TABLET | Freq: Every day | ORAL | Status: DC
  Administered 2015-11-02 – 2015-11-05 (×4): 81 mg via ORAL
  Filled 2015-11-01 (×4): qty 1

## 2015-11-01 MED ORDER — DEXTROSE 5 % IV SOLN
1.0000 g | Freq: Once | INTRAVENOUS | Status: AC
  Administered 2015-11-01: 1 g via INTRAVENOUS
  Filled 2015-11-01: qty 10

## 2015-11-01 NOTE — ED Notes (Signed)
Attempted report x1. 

## 2015-11-01 NOTE — Progress Notes (Signed)
Pt removed from bipap at 2110 due to intolerance and high leak levels.  Rt will monitor.  Pt placed on 2lpm [place].

## 2015-11-01 NOTE — ED Notes (Signed)
Respiratory at bedside.

## 2015-11-01 NOTE — ED Provider Notes (Signed)
CSN: [id number]     Arrival date & time 11/01/15  1824 History   First MD Initiated Contact with Patient 11/01/15 1827     Chief Complaint  Patient presents with  . Respiratory Distress   PT HERE WITH SOB.  HE SAID THAT HE'S HAD SOB AND COUGH FOR 3-4 DAYS.  PT SAID HIS WIFE PASSED AWAY A FEW DAYS AGO WITH SIMILAR COMPLAINTS.  THE PT'S RA O2 SAT WAS 82% BY EMS.  THE PT WAS PLACED ON A 100% NRB.  PT HAD 2 NEBS PTA.  (Consider location/radiation/quality/duration/timing/severity/associated sxs/prior Treatment) The history is provided by the patient.    Past Medical History  Diagnosis Date  . History of non-ST Elevation MI (myocardial infarction) March 2007    Admitted with COPD exacerbation, given by CHF. Had some chest pain with positive troponins. Cath showed multivessel disease, referred for CABG  . CAD in native artery March 2007    Cath for dyspnea on exertion: 75% distal LM, 85% RI, 95% mid-distal Cx, in multiple RCA lesions with 95% distal. --> Referred for CABG; has declined further noninvasive evaluation in the absence of worsening symptoms  . S/P CABG x 24 July 2005    LIMA-LAD, SVG-OM, seq SVG-PDA -PLB  . Atrial fibrillation, chronic (HCC)     Anticoagulated on warfarin. Stable -- post op  . H/O echocardiogram March ; January 2015    a) Normal LV size and cavity appeared normal function. Grade 1 diastolic dysfunction. Limited study.; b) normal LV size and function with EF 60-65%. Aortic sclerosis without stenosis, mildly increased PA pressures of but normal IVC and RA/RV size  . Obesity (BMI 30-39.9)     One year ago weighed 265 pounds, (12/2012) -- now 234 pounds  . Dyslipidemia, goal LDL below 70   . COPD (chronic obstructive pulmonary disease) with emphysema (HCC) 05/23/2007    Hosp 5/29-6/01/12- COPD exacerbation ONOX 12/08/10- desaturated to less than 88% for over an hour, qualifying for home O2 during sleep    . Hypoxia      chronic, on home O2  . History of  pneumonia   . Seasonal allergies   . Diverticulosis of colon with hemorrhage 05/24/2013  . Hypertension   . Shortness of breath dyspnea   . Pneumonia     hx  . Myocardial infarction (HCC)   . Anxiety   . AAA (abdominal aortic aneurysm) (HCC)     4.2 X 4.8 cm 04/2013 CT; declined re-eval 02/25/15   Past Surgical History  Procedure Laterality Date  . Cataract extraction      Laser  . Rll resection for hamartoma      lungs  . Rul for hamartoma      lung  . Coronary artery bypass graft    . Cardiac catheterization  03 28 2007    NORMAL LV FUNCTION/ ABDOMINAL AORTA STENOSIS,75%-85%. RIGHT FEMORAL ARTERY :CATHETERS USED A  4-FRENCH WITH A 4-FRENCH SHEATH  . Colonoscopy N/A 05/24/2013    Procedure: COLONOSCOPY;  Surgeon: Iva Boop, MD;  Location: [facility];  Service: Endoscopy;  Laterality: N/A;  . Amputation Right 10/08/2015    Procedure: RIGHT FOOT TRANSMET AMPUTATION;  Surgeon: Toni Arthurs, MD;  Location: [facility] OR;  Service: Orthopedics;  Laterality: Right;  . Achilles tendon lengthening Right 10/08/2015    Procedure: ACHILLES TENDON LENGTHENING;  Surgeon: Toni Arthurs, MD;  Location: [facility] OR;  Service: Orthopedics;  Laterality: Right;   Family History  Problem Relation Age of Onset  .  Heart attack Mother   . Heart attack Father    Social History  Substance Use Topics  . Smoking status: Former Smoker -- 2.00 packs/day for 55 years    Types: Cigarettes    Quit date: 05/23/1998  . Smokeless tobacco: Never Used  . Alcohol Use: No    Review of Systems  Respiratory: Positive for shortness of breath.   All other systems reviewed and are negative.     Allergies  Prednisone  Home Medications   Prior to Admission medications   Medication Sig Start Date End Date Taking? Authorizing Provider  amLODipine (NORVASC) 10 MG tablet Take 1 tablet (10 mg total) by mouth daily. 05/01/13  Yes Dorothea OgleIskra M Myers, MD  aspirin EC 81 MG tablet Take 81 mg by mouth daily.   Yes Historical Provider,  MD  budesonide-formoterol (SYMBICORT) 160-4.5 MCG/ACT inhaler Inhale 2 puffs into the lungs 2 (two) times daily. 03/11/14  Yes Waymon Budgelinton D Young, MD  cholecalciferol (VITAMIN D) 1000 UNITS tablet Take 1,000 Units by mouth daily.   Yes Historical Provider, MD  furosemide (LASIX) 40 MG tablet TAKE ONE TABLET BY MOUTH EVERY MORNING 08/04/15  Yes Marykay Lexavid W Harding, MD  ipratropium-albuterol (DUONEB) 0.5-2.5 (3) MG/3ML SOLN Take 3 mLs by nebulization 4 (four) times daily.    Yes Historical Provider, MD  KLOR-CON M20 20 MEQ tablet TAKE 1 TABLET (20 MEQ TOTAL) BY MOUTH DAILY. 01/05/15  Yes Marykay Lexavid W Harding, MD  OXYGEN Inhale 2 L into the lungs as needed (shortness of breath).    Yes Historical Provider, MD  sertraline (ZOLOFT) 50 MG tablet Take 50 mg by mouth daily.   Yes Historical Provider, MD  simvastatin (ZOCOR) 20 MG tablet Take 10 mg by mouth daily.  06/18/13  Yes Historical Provider, MD  warfarin (COUMADIN) 2.5 MG tablet TAKE 1-2 TABLETS BY MOUTH DAILY AS DIRECTED BY COUMADIN CLINIC Patient taking differently: Take 2.5 mg by mouth daily. OR AS DIRECTED BY COUMADIN CLINIC 03/16/15  Yes Marykay Lexavid W Harding, MD  docusate sodium (COLACE) 100 MG capsule Take 1 capsule (100 mg total) by mouth 2 (two) times daily. While taking narcotic pain medicine. Patient not taking: Reported on 11/01/2015 10/08/15   Jacinta ShoeJustin Pike Ollis, PA-C  oxyCODONE (ROXICODONE) 5 MG immediate release tablet Take 1-2 tablets (5-10 mg total) by mouth every 4 (four) hours as needed for moderate pain or severe pain. Patient not taking: Reported on 11/01/2015 10/08/15   Jacinta ShoeJustin Pike Ollis, PA-C  senna (SENOKOT) 8.6 MG TABS tablet Take 2 tablets (17.2 mg total) by mouth 2 (two) times daily. Patient not taking: Reported on 11/01/2015 10/08/15   Florentina AddisonJustin Pike Ollis, PA-C   BP 148/82 mmHg  Pulse 118  Temp(Src) 100.8 F (38.2 C) (Oral)  Resp 27  Ht 6\' 5"  (1.956 m)  Wt 212 lb (96.163 kg)  BMI 25.13 kg/m2  SpO2 100% Physical Exam  Constitutional: He is  oriented to person, place, and time. He appears distressed.  HENT:  Head: Normocephalic and atraumatic.  Right Ear: External ear normal.  Left Ear: External ear normal.  Nose: Nose normal.  Mouth/Throat: Oropharynx is clear and moist.  Eyes: Conjunctivae and EOM are normal. Pupils are equal, round, and reactive to light.  Neck: Normal range of motion. Neck supple.  Cardiovascular: An irregularly irregular rhythm present. Tachycardia present.   Pulmonary/Chest: Accessory muscle usage present. Tachypnea noted. He is in respiratory distress. He has wheezes. He has rhonchi. He has rales.  Abdominal: Soft. Bowel sounds are normal.  Musculoskeletal: Normal range of motion.  Neurological: He is alert and oriented to person, place, and time.  Skin: Skin is warm and dry.  Psychiatric: He has a normal mood and affect. His behavior is normal. Judgment and thought content normal.  Nursing note and vitals reviewed.   ED Course  .Critical Care Performed by: Jacalyn Lefevre Authorized by: Jacalyn Lefevre Total critical care time: 30 minutes Critical care time was exclusive of separately billable procedures and treating other patients. Critical care was necessary to treat or prevent imminent or life-threatening deterioration of the following conditions: respiratory failure. Critical care was time spent personally by me on the following activities: discussions with consultants, evaluation of patient's response to treatment, examination of patient, ordering and performing treatments and interventions, ordering and review of laboratory studies, ordering and review of radiographic studies, pulse oximetry, re-evaluation of patient's condition and review of old charts. Subsequent provider of critical care: I assumed direction of critical care for this patient from another provider of my specialty.   (including critical care time) Labs Review Labs Reviewed  COMPREHENSIVE METABOLIC PANEL - Abnormal; Notable  for the following:    Glucose, Bld 172 (*)    BUN 26 (*)    Creatinine, Ser 1.73 (*)    Albumin 3.4 (*)    ALT 15 (*)    GFR calc non Af Amer 36 (*)    GFR calc Af Amer 41 (*)    All other components within normal limits  CBC WITH DIFFERENTIAL/PLATELET - Abnormal; Notable for the following:    MCH 24.8 (*)    RDW 18.4 (*)    Neutro Abs 8.4 (*)    Lymphs Abs 0.3 (*)    Monocytes Absolute 1.7 (*)    All other components within normal limits  TROPONIN I - Abnormal; Notable for the following:    Troponin I 0.09 (*)    All other components within normal limits  BRAIN NATRIURETIC PEPTIDE - Abnormal; Notable for the following:    B Natriuretic Peptide 399.0 (*)    All other components within normal limits  I-STAT ARTERIAL BLOOD GAS, ED - Abnormal; Notable for the following:    pO2, Arterial 57.0 (*)    Bicarbonate 24.8 (*)    All other components within normal limits  CULTURE, BLOOD (ROUTINE X 2)  CULTURE, BLOOD (ROUTINE X 2)  URINALYSIS, ROUTINE W REFLEX MICROSCOPIC (NOT AT [facility])  I-STAT TROPOININ, ED  I-STAT CG4 LACTIC ACID, ED    Imaging Review Dg Chest Port 1 View  11/01/2015  CLINICAL DATA:  Acute onset of shortness of breath and productive cough. Respiratory distress. Initial encounter. EXAM: PORTABLE CHEST 1 VIEW COMPARISON:  Chest radiograph performed 10/08/2015 FINDINGS: A small right pleural effusion is noted. Vascular congestion is seen. Increased interstitial markings raise concern for mild interstitial edema. No no pneumothorax is seen. The cardiomediastinal silhouette is borderline normal in size. The patient is status post median sternotomy, with evidence of prior CABG. No acute osseous abnormalities are seen. IMPRESSION: Small right pleural effusion noted. Vascular congestion seen. Increased interstitial markings raise concern for mild interstitial edema. Electronically Signed   By: Roanna Raider M.D.   On: 11/01/2015 19:22   I have personally reviewed and evaluated  these images and lab results as part of my medical decision-making.   EKG Interpretation   Date/Time:  Sunday November 01 2015 18:35:35 EDT Ventricular Rate:  124 PR Interval:    QRS Duration: 138 QT Interval:  343 QTC Calculation: 493  R Axis:   106 Text Interpretation:  Atrial fibrillation RBBB and LPFB Probable posterior  infarct, acute Minimal ST elevation, inferior leads Baseline wander in  lead(s) II III aVR aVL aVF V3 V4 V5 Confirmed by Rhianna Raulerson MD, Tavon Magnussen  ([id number]) on 11/01/2015 6:51:50 PM      MDM  PT'S WORK OF BREATHING HAS IMPROVED, BUT HE IS STILL WORKING HARD.  PT D/W DR. Adela Glimpse WHO WILL ADMIT PT TO STEP DOWN. Final diagnoses:  Fever, unspecified fever cause  Acute respiratory failure with hypoxia (HCC)  RLL pneumonia  Elevated troponin      Jacalyn Lefevre, MD 11/01/15 2258

## 2015-11-01 NOTE — ED Notes (Signed)
Respiratory at bedside collecting SBG

## 2015-11-01 NOTE — ED Notes (Signed)
MD at bedside. 

## 2015-11-01 NOTE — ED Notes (Signed)
Admitting at bedside.  Asking Respiratory to be contacted to ween patient off of BiPap.  Respiratory made aware.

## 2015-11-01 NOTE — Progress Notes (Signed)
ANTICOAGULATION CONSULT NOTE - Initial Consult  Pharmacy Consult for Coumadin Indication: atrial fibrillation  Allergies  Allergen Reactions  . Prednisone Other (See Comments)    Makes pt jittery and nervous    Patient Measurements: Height: 6\' 5"  (195.6 cm) Weight: 212 lb (96.163 kg) IBW/kg (Calculated) : 89.1  Vital Signs: Temp: 100.8 F (38.2 C) (06/11 1832) Temp Source: Oral (06/11 1832) BP: 125/70 mmHg (06/11 2215) Pulse Rate: 99 (06/11 2215)  Labs:  Recent Labs  10/30/15 1202 11/01/15 1846  HGB  --  13.1  HCT  --  42.6  PLT  --  188  INR 3.4  --   CREATININE  --  1.73*  TROPONINI  --  0.09*    Estimated Creatinine Clearance: 43.6 mL/min (by C-G formula based on Cr of 1.73).   Medical History: Past Medical History  Diagnosis Date  . History of non-ST Elevation MI (myocardial infarction) March 2007    Admitted with COPD exacerbation, given by CHF. Had some chest pain with positive troponins. Cath showed multivessel disease, referred for CABG  . CAD in native artery March 2007    Cath for dyspnea on exertion: 75% distal LM, 85% RI, 95% mid-distal Cx, in multiple RCA lesions with 95% distal. --> Referred for CABG; has declined further noninvasive evaluation in the absence of worsening symptoms  . S/P CABG x 24 July 2005    LIMA-LAD, SVG-OM, seq SVG-PDA -PLB  . Atrial fibrillation, chronic (HCC)     Anticoagulated on warfarin. Stable -- post op  . H/O echocardiogram March ; January 2015    a) Normal LV size and cavity appeared normal function. Grade 1 diastolic dysfunction. Limited study.; b) normal LV size and function with EF 60-65%. Aortic sclerosis without stenosis, mildly increased PA pressures of 44mmHg but normal IVC and RA/RV size  . Obesity (BMI 30-39.9)     One year ago weighed 265 pounds, (12/2012) -- now 234 pounds  . Dyslipidemia, goal LDL below 70   . COPD (chronic obstructive pulmonary disease) with emphysema (HCC) 05/23/2007    Hosp  5/29-6/01/12- COPD exacerbation ONOX 12/08/10- desaturated to less than 88% for over an hour, qualifying for home O2 during sleep    . Hypoxia      chronic, on home O2  . History of pneumonia   . Seasonal allergies   . Diverticulosis of colon with hemorrhage 05/24/2013  . Hypertension   . Shortness of breath dyspnea   . Pneumonia     hx  . Myocardial infarction (HCC)   . Anxiety   . AAA (abdominal aortic aneurysm) (HCC)     4.2 X 4.8 cm 04/2013 CT; declined re-eval 02/25/15    Medications:  No current facility-administered medications on file prior to encounter.   Current Outpatient Prescriptions on File Prior to Encounter  Medication Sig Dispense Refill  . amLODipine (NORVASC) 10 MG tablet Take 1 tablet (10 mg total) by mouth daily. 30 tablet 1  . budesonide-formoterol (SYMBICORT) 160-4.5 MCG/ACT inhaler Inhale 2 puffs into the lungs 2 (two) times daily. 2 Inhaler 0  . cholecalciferol (VITAMIN D) 1000 UNITS tablet Take 1,000 Units by mouth daily.    . furosemide (LASIX) 40 MG tablet TAKE ONE TABLET BY MOUTH EVERY MORNING 90 tablet 0  . ipratropium-albuterol (DUONEB) 0.5-2.5 (3) MG/3ML SOLN Take 3 mLs by nebulization 4 (four) times daily.     Marland Kitchen. KLOR-CON M20 20 MEQ tablet TAKE 1 TABLET (20 MEQ TOTAL) BY MOUTH DAILY. 30 tablet 10  .  sertraline (ZOLOFT) 50 MG tablet Take 50 mg by mouth daily.    Marland Kitchen warfarin (COUMADIN) 2.5 MG tablet TAKE 1-2 TABLETS BY MOUTH DAILY AS DIRECTED BY COUMADIN CLINIC (Patient taking differently: Take 2.5 mg by mouth daily. OR AS DIRECTED BY COUMADIN CLINIC) 45 tablet 5  . docusate sodium (COLACE) 100 MG capsule Take 1 capsule (100 mg total) by mouth 2 (two) times daily. While taking narcotic pain medicine. (Patient not taking: Reported on 11/01/2015) 30 capsule 0  . oxyCODONE (ROXICODONE) 5 MG immediate release tablet Take 1-2 tablets (5-10 mg total) by mouth every 4 (four) hours as needed for moderate pain or severe pain. (Patient not taking: Reported on 11/01/2015) 30  tablet 0  . senna (SENOKOT) 8.6 MG TABS tablet Take 2 tablets (17.2 mg total) by mouth 2 (two) times daily. (Patient not taking: Reported on 11/01/2015) 30 each 0  . [DISCONTINUED] warfarin (COUMADIN) 10 MG tablet Take 10 mg by mouth as directed. Take 2 tablets ( ) on Tuesday & Thursday all other days take 1 tablet ( )       Assessment: 82 y.o. male admitted with  SOB/COPD exacerbation, h/o Afib, to continue Coumadin.  INR in Coumadin clinic Friday was 3.4  Goal of Therapy:  INR 2-3 Monitor platelets by anticoagulation protocol: Yes   Plan:  F/U daily INR   Beautifull Cisar, Gary Fleet 11/01/2015,10:57 PM

## 2015-11-01 NOTE — H&P (Signed)
Roberto Reilly ZOX:[id number] DOB: 06-27-1933 DOA: 11/01/2015    PCP: Pearson Grippe, MD   Outpatient Specialists: Pulmonology Young, Cardiology Hardign Patient coming from:  home Lives  Alone after wife died 1 week ago    Chief Complaint: soreness of breath  HPI: Roberto Reilly is a 82 y.o. male with medical history significant of CAD, COPDon home oxygen,ddiastolic heart failure, PAF IB chronic on anticoagulation, HTN, status post right thoracotomy with chronic small right pleural effusion    Presented with 3 - 4 days of shortness of breath and  Cough productive of green sputum.  Of note his wife has passed away recently he feels that from similar respiratory but she has been chronically ill she sounds like had a coronary event.  Patient called EMS secondary to worsening shortness of breath he attempted to use nebulizers continue relief not associated with a chest pain. per EMS patient had increased work of breathing.  He reports some wheezing today. Thinks he has been taking his medications.  on EMS arrival oxygen saturation 82% by EMS she was put on heart percent nonrebreather  And 2 were administered.  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 and osteomyelitis He has no history of atrial fibrillation on Coumadin Last echogram was done in 2015 showing preserved EF and grade 1 diastolic dysfunction  IN ER: On arrival low-grade temperature 100.8, heart rate up to 118, respirations up to 35, blood pressure initially 1050 65 now up to 140-82 currently oxygen saturation100% on BiPAP Was found to have WBC 10.5 hemoglobin 13.1 creatinine up  to 1.73 troponin elevated 0.09INR 3.4 lactic acid  1.78 ABG was done 7.397/40.3/57.0 chest x-ray shows small right pleural effusion and vascular congestion increased interstitial markings   Hospitalist was called for admission for acute respiratory  Failure with hypoxia and sepsis  Likely secondary to pulmonary etiology  Review of Systems:    Pertinent positives include:  Fevers, chills, shortness of breath at rest. dyspnea on exertion, excess mucus non-productive cough Constitutional:  No weight loss, night sweats,fatigue, weight loss  HEENT:  No headaches, Difficulty swallowing,Tooth/dental problems,Sore throat,  No sneezing, itching, ear ache, nasal congestion, post nasal drip,  Cardio-vascular:  No chest pain, Orthopnea, PND, anasarca, dizziness, palpitations.no Bilateral lower extremity swelling  GI:  No heartburn, indigestion, abdominal pain, nausea, vomiting, diarrhea, change in bowel habits, loss of appetite, melena, blood in stool, hematemesis Resp:   no productive cough, No , No coughing up of blood.No change in color of mucus.No wheezing. Skin:  no rash or lesions. No jaundice GU:  no dysuria, change in color of urine, no urgency or frequency. No straining to urinate.  No flank pain.  Musculoskeletal:  No joint pain or no joint swelling. No decreased range of motion. No back pain.  Psych:  No change in mood or affect. No depression or anxiety. No memory loss.  Neuro: no localizing neurological complaints, no tingling, no weakness, no double vision, no gait abnormality, no slurred speech, no confusion  As per HPI otherwise 10 point review of systems negative.   Past Medical History: Past Medical History  Diagnosis Date  . History of non-ST Elevation MI (myocardial infarction) March 2007  Admitted with COPD exacerbation, given by CHF. Had some chest pain with positive troponins. Cath showed multivessel disease, referred for CABG  . CAD in native artery March 2007    Cath for dyspnea on exertion: 75% distal LM, 85%  RI, 95% mid-distal Cx, in multiple RCA lesions with 95% distal. --> Referred for CABG; has declined further noninvasive evaluation in the absence of worsening symptoms  . S/P CABG x 24 July 2005    LIMA-LAD, SVG-OM, seq SVG-PDA -PLB  . Atrial fibrillation, chronic (HCC)     Anticoagulated on warfarin. Stable -- post op  . H/O echocardiogram March ; January 2015    a) Normal LV size and cavity appeared normal function. Grade 1 diastolic dysfunction. Limited study.; b) normal LV size and function with EF 60-65%. Aortic sclerosis without stenosis, mildly increased PA pressures of 44mmHg but normal IVC and RA/RV size  . Obesity (BMI 30-39.9)     One year ago weighed 265 pounds, (12/2012) -- now 234 pounds  . Dyslipidemia, goal LDL below 70   . COPD (chronic obstructive pulmonary disease) with emphysema (HCC) 05/23/2007    Hosp 5/29-6/01/12- COPD exacerbation ONOX 12/08/10- desaturated to less than 88% for over an hour, qualifying for home O2 during sleep    . Hypoxia      chronic, on home O2  . History of pneumonia   . Seasonal allergies   . Diverticulosis of colon with hemorrhage 05/24/2013  . Hypertension   . Shortness of breath dyspnea   . Pneumonia     hx  . Myocardial infarction (HCC)   . Anxiety   . AAA (abdominal aortic aneurysm) (HCC)     4.2 X 4.8 cm 04/2013 CT; declined re-eval 02/25/15   Past Surgical History  Procedure Laterality Date  . Cataract extraction      Laser  . Rll resection for hamartoma      lungs  . Rul for hamartoma      lung  . Coronary artery bypass graft    . Cardiac catheterization  03 28 2007    NORMAL LV FUNCTION/ ABDOMINAL AORTA STENOSIS,75%-85%. RIGHT FEMORAL ARTERY :CATHETERS USED A  4-FRENCH WITH A 4-FRENCH SHEATH  . Colonoscopy N/A 05/24/2013    Procedure: COLONOSCOPY;  Surgeon: Iva Booparl E Gessner, MD;  Location: [facility];  Service: Endoscopy;  Laterality: N/A;  . Amputation Right 10/08/2015    Procedure: RIGHT FOOT TRANSMET AMPUTATION;  Surgeon: Toni ArthursJohn  Hewitt, MD;  Location: [facility] OR;  Service: Orthopedics;  Laterality: Right;  . Achilles tendon lengthening Right 10/08/2015    Procedure: ACHILLES TENDON LENGTHENING;  Surgeon: Toni ArthursJohn Hewitt, MD;  Location: [facility] OR;  Service: Orthopedics;  Laterality: Right;     Social History:  Ambulatory   Independently     reports that he quit smoking about 17 years ago. His smoking use included Cigarettes. He has a 110 pack-year smoking history. He has never used smokeless tobacco. He reports that he does not drink alcohol or use illicit drugs.  Allergies:   Allergies  Allergen Reactions  . Prednisone Other (See Comments)    Makes pt jittery and nervous    Family History:  Family History  Problem Relation Age of Onset  . Heart attack Mother   . Heart attack Father     Medications: Prior to Admission medications   Medication Sig Start Date End Date Taking? Authorizing Provider  amLODipine (NORVASC) 10 MG tablet Take 1 tablet (10 mg total) by mouth daily. 05/01/13  Yes Iskra  Aurther Loft, MD  aspirin EC 81 MG tablet Take 81 mg by mouth daily.   Yes Historical Provider, MD  budesonide-formoterol (SYMBICORT) 160-4.5 MCG/ACT inhaler Inhale 2 puffs into the lungs 2 (two) times daily. 03/11/14  Yes Waymon Budge, MD  cholecalciferol (VITAMIN D) 1000 UNITS tablet Take 1,000 Units by mouth daily.   Yes Historical Provider, MD  furosemide (LASIX) 40 MG tablet TAKE ONE TABLET BY MOUTH EVERY MORNING 08/04/15  Yes Marykay Lex, MD  ipratropium-albuterol (DUONEB) 0.5-2.5 (3) MG/3ML SOLN Take 3 mLs by nebulization 4 (four) times daily.    Yes Historical Provider, MD  KLOR-CON M20 20 MEQ tablet TAKE 1 TABLET (20 MEQ TOTAL) BY MOUTH DAILY. 01/05/15  Yes Marykay Lex, MD  OXYGEN Inhale 2 L into the lungs as needed (shortness of breath).    Yes Historical Provider, MD  sertraline (ZOLOFT) 50 MG tablet Take 50 mg by mouth daily.   Yes Historical Provider, MD  simvastatin (ZOCOR) 20 MG tablet Take 10 mg by mouth  daily.  06/18/13  Yes Historical Provider, MD  warfarin (COUMADIN) 2.5 MG tablet TAKE 1-2 TABLETS BY MOUTH DAILY AS DIRECTED BY COUMADIN CLINIC Patient taking differently: Take 2.5 mg by mouth daily. OR AS DIRECTED BY COUMADIN CLINIC 03/16/15  Yes Marykay Lex, MD  docusate sodium (COLACE) 100 MG capsule Take 1 capsule (100 mg total) by mouth 2 (two) times daily. While taking narcotic pain medicine. Patient not taking: Reported on 11/01/2015 10/08/15   Jacinta Shoe, PA-C  oxyCODONE (ROXICODONE) 5 MG immediate release tablet Take 1-2 tablets (5-10 mg total) by mouth every 4 (four) hours as needed for moderate pain or severe pain. Patient not taking: Reported on 11/01/2015 10/08/15   Jacinta Shoe, PA-C  senna (SENOKOT) 8.6 MG TABS tablet Take 2 tablets (17.2 mg total) by mouth 2 (two) times daily. Patient not taking: Reported on 11/01/2015 10/08/15   Jacinta Shoe, [place]    Physical Exam: Patient Vitals for the past 24 hrs:  BP Temp Temp src Pulse Resp SpO2 Height Weight  11/01/15 1945 148/82 mmHg - - 118 (!) 27 100 % - -  11/01/15 1930 128/75 mmHg - - 116 (!) 29 99 % - -  11/01/15 1915 118/68 mmHg - - 118 (!) 35 99 % - -  11/01/15 1832 105/65 mmHg 100.8 F (38.2 C) Oral 111 (!) 32 94 % 6\' 5"  (1.956 m) 96.163 kg (212 lb)    1. General:  in No Acute distress speaking in full sentences but with some increase work of breathing using accesory muscles. Producing green sputum 2. Psychological: Alert and  Oriented 3. Head/ENT:   Moist   Mucous Membranes                          Head Non traumatic, neck supple                          Normal  Dentition 4. SKIN: normal   Skin turgor,  Skin clean Dry and intact no rash 5. Heart: Regular rate and rhythm no Murmur, Rub or gallop 6. Lungs:extensive wheezes and some crackles   7. Abdomen: Soft, non-tender, Non distended 8. Lower extremities: no clubbing, cyanosis, or edema 9. Neurologically Grossly intact, moving all 4 extremities  equally 10. MSK: Normal range of motion   body mass index is 25.13 kg/(m^2).  Labs on Admission:  Labs on Admission: I have personally reviewed following labs and imaging studies  CBC:  Recent Labs Lab 11/01/15 1846  WBC 10.5  NEUTROABS 8.4*  HGB 13.1  HCT 42.6  MCV 80.7  PLT 188   Basic Metabolic Panel:  Recent Labs Lab 11/01/15 1846  NA 135  K 4.6  CL 101  CO2 24  GLUCOSE 172*  BUN 26*  CREATININE 1.73*  CALCIUM 9.0   GFR: Estimated Creatinine Clearance: 43.6 mL/min (by C-G formula based on Cr of 1.73). Liver Function Tests:  Recent Labs Lab 11/01/15 1846  AST 20  ALT 15*  ALKPHOS 69  BILITOT 1.2  PROT 7.3  ALBUMIN 3.4*   No results for input(s): LIPASE, AMYLASE in the last 168 hours. No results for input(s): AMMONIA in the last 168 hours. Coagulation Profile:  Recent Labs Lab 10/30/15 1202  INR 3.4   Cardiac Enzymes:  Recent Labs Lab 11/01/15 1846  TROPONINI 0.09*   BNP (last 3 results) No results for input(s): PROBNP in the last 8760 hours. HbA1C: No results for input(s): HGBA1C in the last 72 hours. CBG: No results for input(s): GLUCAP in the last 168 hours. Lipid Profile: No results for input(s): CHOL, HDL, LDLCALC, TRIG, CHOLHDL, LDLDIRECT in the last 72 hours. Thyroid Function Tests: No results for input(s): TSH, T4TOTAL, FREET4, T3FREE, THYROIDAB in the last 72 hours. Anemia Panel: No results for input(s): VITAMINB12, FOLATE, FERRITIN, TIBC, IRON, RETICCTPCT in the last 72 hours. Urine analysis:    Component Value Date/Time   COLORURINE YELLOW 10/19/2010 1150   APPEARANCEUR CLEAR 10/19/2010 1150   LABSPEC 1.012 10/19/2010 1150   PHURINE 5.0 10/19/2010 1150   GLUCOSEU NEGATIVE 10/19/2010 1150   HGBUR NEGATIVE 10/19/2010 1150   BILIRUBINUR NEGATIVE 10/19/2010 1150   KETONESUR NEGATIVE 10/19/2010 1150   PROTEINUR NEGATIVE 10/19/2010 1150   UROBILINOGEN 0.2 10/19/2010 1150   NITRITE NEGATIVE 10/19/2010 1150    LEUKOCYTESUR  10/19/2010 1150    NEGATIVE MICROSCOPIC NOT DONE ON URINES WITH NEGATIVE PROTEIN, BLOOD, LEUKOCYTES, NITRITE, OR GLUCOSE <1000 mg/dL.   Sepsis Labs: (procalcitonin:4,lacticidven:4) )No results found for this or any previous visit (from the past 240 hour(s)).       UA ordered   No results found for: HGBA1C  Estimated Creatinine Clearance: 43.6 mL/min (by C-G formula based on Cr of 1.73).  BNP (last 3 results) No results for input(s): PROBNP in the last 8760 hours.   ECG REPORT  Independently reviewed Rate: 124  Rhythm: A.fib w RBBB ST&T Change: non-specific changes QTC 424  Will repeat  Filed Weights   11/01/15 1832  Weight: 96.163 kg (212 lb)     Cultures:    Component Value Date/Time   SDES SPUTUM 05/01/2013 1149   SDES SPUTUM 05/01/2013 1149   SPECREQUEST NONE 05/01/2013 1149   SPECREQUEST NONE 05/01/2013 1149   CULT  05/01/2013 1149    NORMAL OROPHARYNGEAL FLORA Performed at [facility]   REPTSTATUS 05/01/2013 FINAL 05/01/2013 1149   REPTSTATUS 05/03/2013 FINAL 05/01/2013 1149     Radiological Exams on Admission: Dg Chest Port 1 View  11/01/2015  CLINICAL DATA:  Acute onset of shortness of breath and productive cough. Respiratory distress. Initial encounter. EXAM: PORTABLE CHEST 1 VIEW COMPARISON:  Chest radiograph performed 10/08/2015 FINDINGS: A small right pleural effusion is noted. Vascular congestion is seen. Increased interstitial markings raise concern for mild interstitial edema. No no pneumothorax is seen. The cardiomediastinal silhouette is borderline normal in size. The patient is status post median  sternotomy, with evidence of prior CABG. No acute osseous abnormalities are seen. IMPRESSION: Small right pleural effusion noted. Vascular congestion seen. Increased interstitial markings raise concern for mild interstitial edema. Electronically Signed   By: Roanna Raider M.D.   On: 11/01/2015 19:22    Chart has been  reviewed    Assessment/Plan   82 y.o. male with medical history significant of CAD, COPDon home oxygen,ddiastolic heart failure, PAF IB chronic on anticoagulation, HTN, status post right thoracotomy with chronic small right pleural effusion being admitted for acute respiratory failure with hypoxia, sepsis elevated troponin   Present on Admission:  . COPD exacerbation (HCC)  -  - Will initiate Steroid taper, antibiotics, Albuterol PRN, scheduled DuoNeb, Dulera and Mucinex. Titrate O2 to saturation >90%. Follow patients respiratory status.  . Atrial fibrillation, chronic / persistent - on coumadin,  Increased rate in the setting of albuterol administration increased work of breathing, and even acute COPD exacerbation we'll hold off on beta blocker right now CHA2DS2 Vas score - 5 . CAD in native artery - LM, RCA, RI & Cx disease --> CABG x 4 (LIMA-LAD, SVG-OM/RI, SVG-rPDA-rPL) - currently stable elevated troponin likely secondary to increased work of breathing and will continue to follow serial troponins and obtain echogram. Notify cardiology patient has been admitted  . Dyslipidemia, goal LDL below 70 - continue statin  . Essential hypertension - blood pressure currently stable restart Norvasc tomorrow  . Pleural effusion - chronic patient status post right thoracotomy states for benign lesion removal. He has chronic right pleural effusion. Will obtain 2 view chest 2 father evaluate for any evidence of infiltrate  . Elevated troponin - abnormal ECG will repeat, no chest pain but dyspnea will continue to cycle cardiac enzymes. Obtain echogram. Most likely elevated troponin in the setting of increased work of breathing.  . Acute renal failure (ARF) (HCC) - feel give gentle IV fluids given history of CHF. Repeat chest x-ray to evaluate if there is any evidence of fluid overload  . Sepsis (HCC) meeting criteria for sepsis  - with elevated heart rate respiratory rate and low grade fever. Most likely  explanations for this is COPD exacerbation with increased work of breathing but will obtain serial lactic acid and monitor carefully. Obtain blood cultures and IV antibiotics. No evidence of severe sepsis noted      Other plan as per orders.  DVT prophylaxis:  coumadin  Code Status:    DNR/DNI as per patient    Family Communication:   Family not  at  Bedside   Disposition Plan:      To home once workup is complete and patient is stable   Consults called: none  Admission status:    inpatient      Level of care     SDU      Raunak Antuna 11/01/2015, 9:27 PM    [facility]  Pager [phone]   after 2 AM please page floor coverage PA If 7AM-7PM, please contact the day team taking care of the patient  [email or web address]  Password [email or web address]

## 2015-11-01 NOTE — Progress Notes (Signed)
NTS ordered for pt but procedure only nproduced small amounts of moderstely thick white sputum. Pt is coughing up copious amts of yellow, tenacious sputum.  Pts sats dropped to 88% at its lowestr during performing NTS.  Pt is rattling and rhonchi, but xopenex is the ordered neb and pt is not wheezing.  Rt will monitor.

## 2015-11-01 NOTE — ED Notes (Addendum)
Pt's saturations dropped to 89% on .  Placed pt on NRB.

## 2015-11-01 NOTE — ED Notes (Signed)
Per eMS:  Pt presents to ED for SOB.  Pt has a hx of COPD, did two home nebulizations today without any relief.  Pt denies any CP.   Sts he has had a cough and SOB x 3-4 days.  Wife recently passed due to same.  Pt working hard to breath

## 2015-11-02 ENCOUNTER — Encounter (HOSPITAL_COMMUNITY): Payer: Self-pay | Admitting: Physician Assistant

## 2015-11-02 ENCOUNTER — Inpatient Hospital Stay (HOSPITAL_COMMUNITY): Payer: Medicare Other

## 2015-11-02 DIAGNOSIS — J441 Chronic obstructive pulmonary disease with (acute) exacerbation: Secondary | ICD-10-CM

## 2015-11-02 DIAGNOSIS — I2510 Atherosclerotic heart disease of native coronary artery without angina pectoris: Secondary | ICD-10-CM

## 2015-11-02 DIAGNOSIS — R0600 Dyspnea, unspecified: Secondary | ICD-10-CM

## 2015-11-02 DIAGNOSIS — R7989 Other specified abnormal findings of blood chemistry: Secondary | ICD-10-CM

## 2015-11-02 DIAGNOSIS — I482 Chronic atrial fibrillation: Secondary | ICD-10-CM

## 2015-11-02 DIAGNOSIS — J90 Pleural effusion, not elsewhere classified: Secondary | ICD-10-CM

## 2015-11-02 LAB — EXPECTORATED SPUTUM ASSESSMENT W GRAM STAIN, RFLX TO RESP C

## 2015-11-02 LAB — URINALYSIS, ROUTINE W REFLEX MICROSCOPIC
GLUCOSE, UA: NEGATIVE mg/dL
Hgb urine dipstick: NEGATIVE
Ketones, ur: 15 mg/dL — AB
LEUKOCYTES UA: NEGATIVE
Nitrite: NEGATIVE
PH: 5 (ref 5.0–8.0)
PROTEIN: NEGATIVE mg/dL
SPECIFIC GRAVITY, URINE: 1.02 (ref 1.005–1.030)

## 2015-11-02 LAB — ECHOCARDIOGRAM COMPLETE
CHL CUP MV DEC (S): 190
EERAT: 10.19
EWDT: 190 ms
FS: 19 % — AB (ref 28–44)
HEIGHTINCHES: 72 in
IV/PV OW: 1.23
LA diam end sys: 41 mm
LA diam index: 1.89 cm/m2
LA vol A4C: 31.1 ml
LASIZE: 41 mm
LAVOL: 32.9 mL
LAVOLIN: 15.2 mL/m2
LV PW d: 10.2 mm — AB (ref 0.6–1.1)
LV TDI E'LATERAL: 8.05
LVEEAVG: 10.19
LVEEMED: 10.19
LVELAT: 8.05 cm/s
LVOT area: 2.27 cm2
LVOT diameter: 17 mm
MV Peak grad: 3 mmHg
MVPKEVEL: 82 m/s
TDI e' medial: 8.05
WEIGHTICAEL: 3224.01 [oz_av]

## 2015-11-02 LAB — BLOOD CULTURE ID PANEL (REFLEXED)
Acinetobacter baumannii: NOT DETECTED
CANDIDA ALBICANS: NOT DETECTED
CANDIDA GLABRATA: NOT DETECTED
Candida krusei: NOT DETECTED
Candida parapsilosis: NOT DETECTED
Candida tropicalis: NOT DETECTED
Carbapenem resistance: NOT DETECTED
ENTEROBACTER CLOACAE COMPLEX: NOT DETECTED
ENTEROBACTERIACEAE SPECIES: NOT DETECTED
ENTEROCOCCUS SPECIES: NOT DETECTED
ESCHERICHIA COLI: NOT DETECTED
HAEMOPHILUS INFLUENZAE: DETECTED — AB
Klebsiella oxytoca: NOT DETECTED
Klebsiella pneumoniae: NOT DETECTED
Listeria monocytogenes: NOT DETECTED
METHICILLIN RESISTANCE: NOT DETECTED
Neisseria meningitidis: NOT DETECTED
PROTEUS SPECIES: NOT DETECTED
PSEUDOMONAS AERUGINOSA: NOT DETECTED
STAPHYLOCOCCUS AUREUS BCID: NOT DETECTED
STREPTOCOCCUS AGALACTIAE: NOT DETECTED
STREPTOCOCCUS PNEUMONIAE: NOT DETECTED
STREPTOCOCCUS PYOGENES: NOT DETECTED
STREPTOCOCCUS SPECIES: NOT DETECTED
Serratia marcescens: NOT DETECTED
Staphylococcus species: NOT DETECTED
VANCOMYCIN RESISTANCE: NOT DETECTED

## 2015-11-02 LAB — CBC
HEMATOCRIT: 41.4 % (ref 39.0–52.0)
HEMOGLOBIN: 12.5 g/dL — AB (ref 13.0–17.0)
MCH: 24.6 pg — ABNORMAL LOW (ref 26.0–34.0)
MCHC: 30.2 g/dL (ref 30.0–36.0)
MCV: 81.3 fL (ref 78.0–100.0)
Platelets: 170 10*3/uL (ref 150–400)
RBC: 5.09 MIL/uL (ref 4.22–5.81)
RDW: 18.2 % — ABNORMAL HIGH (ref 11.5–15.5)
WBC: 11.6 10*3/uL — ABNORMAL HIGH (ref 4.0–10.5)

## 2015-11-02 LAB — TROPONIN I
TROPONIN I: 0.19 ng/mL — AB (ref <0.031)
Troponin I: 0.05 ng/mL — ABNORMAL HIGH (ref <0.031)

## 2015-11-02 LAB — RESPIRATORY PANEL BY PCR
ADENOVIRUS-RVPPCR: NOT DETECTED
Bordetella pertussis: NOT DETECTED
CORONAVIRUS 229E-RVPPCR: NOT DETECTED
CORONAVIRUS HKU1-RVPPCR: NOT DETECTED
CORONAVIRUS OC43-RVPPCR: NOT DETECTED
Chlamydophila pneumoniae: NOT DETECTED
Coronavirus NL63: NOT DETECTED
INFLUENZA A H1 2009-RVPPR: NOT DETECTED
INFLUENZA A H1-RVPPCR: NOT DETECTED
INFLUENZA A H3-RVPPCR: NOT DETECTED
Influenza A: NOT DETECTED
Influenza B: NOT DETECTED
METAPNEUMOVIRUS-RVPPCR: NOT DETECTED
MYCOPLASMA PNEUMONIAE-RVPPCR: NOT DETECTED
PARAINFLUENZA VIRUS 1-RVPPCR: NOT DETECTED
PARAINFLUENZA VIRUS 2-RVPPCR: NOT DETECTED
Parainfluenza Virus 3: NOT DETECTED
Parainfluenza Virus 4: NOT DETECTED
Respiratory Syncytial Virus: NOT DETECTED
Rhinovirus / Enterovirus: NOT DETECTED

## 2015-11-02 LAB — COMPREHENSIVE METABOLIC PANEL
ALBUMIN: 3 g/dL — AB (ref 3.5–5.0)
ALT: 13 U/L — ABNORMAL LOW (ref 17–63)
ANION GAP: 8 (ref 5–15)
AST: 16 U/L (ref 15–41)
Alkaline Phosphatase: 63 U/L (ref 38–126)
BILIRUBIN TOTAL: 0.8 mg/dL (ref 0.3–1.2)
BUN: 33 mg/dL — ABNORMAL HIGH (ref 6–20)
CO2: 28 mmol/L (ref 22–32)
Calcium: 9 mg/dL (ref 8.9–10.3)
Chloride: 101 mmol/L (ref 101–111)
Creatinine, Ser: 1.81 mg/dL — ABNORMAL HIGH (ref 0.61–1.24)
GFR calc Af Amer: 39 mL/min — ABNORMAL LOW (ref >60)
GFR calc non Af Amer: 34 mL/min — ABNORMAL LOW (ref >60)
GLUCOSE: 141 mg/dL — AB (ref 65–99)
POTASSIUM: 5.7 mmol/L — AB (ref 3.5–5.1)
SODIUM: 137 mmol/L (ref 135–145)
TOTAL PROTEIN: 6.6 g/dL (ref 6.5–8.1)

## 2015-11-02 LAB — PROTIME-INR
INR: 2.98 — ABNORMAL HIGH (ref 0.00–1.49)
PROTHROMBIN TIME: 30.4 s — AB (ref 11.6–15.2)

## 2015-11-02 LAB — MAGNESIUM: MAGNESIUM: 2.3 mg/dL (ref 1.7–2.4)

## 2015-11-02 LAB — POTASSIUM: Potassium: 4.1 mmol/L (ref 3.5–5.1)

## 2015-11-02 LAB — PHOSPHORUS: PHOSPHORUS: 4.7 mg/dL — AB (ref 2.5–4.6)

## 2015-11-02 LAB — MRSA PCR SCREENING: MRSA BY PCR: NEGATIVE

## 2015-11-02 LAB — GLUCOSE, CAPILLARY: GLUCOSE-CAPILLARY: 118 mg/dL — AB (ref 65–99)

## 2015-11-02 LAB — TSH: TSH: 0.137 u[IU]/mL — ABNORMAL LOW (ref 0.350–4.500)

## 2015-11-02 LAB — BRAIN NATRIURETIC PEPTIDE: B Natriuretic Peptide: 710.5 pg/mL — ABNORMAL HIGH (ref 0.0–100.0)

## 2015-11-02 MED ORDER — IPRATROPIUM-ALBUTEROL 0.5-2.5 (3) MG/3ML IN SOLN
3.0000 mL | Freq: Three times a day (TID) | RESPIRATORY_TRACT | Status: DC
  Administered 2015-11-02 – 2015-11-05 (×9): 3 mL via RESPIRATORY_TRACT
  Filled 2015-11-02 (×11): qty 3

## 2015-11-02 MED ORDER — SODIUM POLYSTYRENE SULFONATE 15 GM/60ML PO SUSP
30.0000 g | ORAL | Status: AC
  Administered 2015-11-02 (×2): 30 g via ORAL
  Filled 2015-11-02 (×3): qty 120

## 2015-11-02 MED ORDER — FUROSEMIDE 10 MG/ML IJ SOLN
40.0000 mg | Freq: Once | INTRAMUSCULAR | Status: AC
  Administered 2015-11-02: 40 mg via INTRAVENOUS
  Filled 2015-11-02: qty 4

## 2015-11-02 MED ORDER — DEXTROSE 5 % IV SOLN
2.0000 g | INTRAVENOUS | Status: DC
  Administered 2015-11-02: 2 g via INTRAVENOUS
  Filled 2015-11-02 (×2): qty 2

## 2015-11-02 MED ORDER — PERFLUTREN LIPID MICROSPHERE
INTRAVENOUS | Status: AC
  Administered 2015-11-02: 3 mL
  Filled 2015-11-02: qty 10

## 2015-11-02 NOTE — Consult Note (Signed)
CARDIOLOGY CONSULT NOTE   Patient ID: Roberto Reilly MRN: [id number] DOB/AGE: 24-Apr-1934 82 y.o.  Admit date: 11/01/2015  Primary Physician   Pearson Grippe, MD Primary Cardiologist   Dr Herbie [place] Reason for Consultation   Elevated troponin Requesting MD: Dr Ella Jubilee  [id number] [name] is an 82 y.o. year old male with a history of CABG 2007, COPD w/ home O2, nl EF by echo 2015, obesity, HLD, no recent stress test (pt preference) or cath, AF on coumadin, HTN, ?D-CHF (diastolic dysfunction not addressed at echo 2015). 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 Done as outpatient.   Admitted 06/11 with cough and SOB. Troponin elevated, cards asked to see. BNP elevated and CXR w/ mild edema.   Roberto Reilly is breathing much better than on admission. He has had no chest pain. He has had no worries about his heart. No palpitations. No PND, denies orthopnea. He is on O2 prn, generally when humidity is up. Sometimes needs it at rest. Usage varies a lot based on the weather. Uses albuterol 4 x day. Has had some LE edema.   He is recovering well from the foot surgery. His wife (of 38 years) died a week ago 10-06-2022. This has been hard for him. He has been eating poorly because of the situation.    Past Medical History  Diagnosis Date  . History of non-ST Elevation MI (myocardial infarction) March 2007    Admitted with COPD exacerbation, given by CHF. Had some chest pain with positive troponins. Cath showed multivessel disease, referred for CABG  . CAD in native artery March 2007    Cath for dyspnea on exertion: 75% distal LM, 85% RI, 95% mid-distal Cx, in multiple RCA lesions with 95% distal. --> Referred for CABG; has declined further noninvasive evaluation in the absence of worsening symptoms  . S/P CABG x 24 July 2005    LIMA-LAD, SVG-OM, seq SVG-PDA -PLB  . Atrial fibrillation, chronic (HCC)     Anticoagulated on warfarin. Stable -- post op  . H/O echocardiogram March ; January 2015    a) Normal LV size and cavity appeared normal function. Grade 1 diastolic dysfunction. Limited study.; b) normal LV size and function with EF 60-65%. Aortic sclerosis without stenosis, mildly increased PA pressures of but normal IVC and RA/RV size  . Obesity (BMI 30-39.9)     One year ago weighed 265 pounds, (12/2012) -- now 234 pounds  . Dyslipidemia, goal LDL below 70   . COPD (chronic obstructive pulmonary disease) with emphysema (HCC) 05/23/2007    Hosp 5/29-6/01/12- COPD exacerbation ONOX 12/08/10- desaturated to less than 88% for over an hour, qualifying for home O2 during sleep    . Hypoxia      chronic, on home O2  . History of pneumonia   . Seasonal allergies   . Diverticulosis of colon with hemorrhage 05/24/2013  . Hypertension   . Shortness of breath dyspnea   . Pneumonia     hx  . Myocardial infarction (HCC)   . Anxiety   . AAA (abdominal aortic aneurysm) (HCC)     4.2 X 4.8 cm 04/2013 CT; declined re-eval 02/25/15     Past Surgical History  Procedure Laterality Date  . Cataract extraction      Laser  . Rll resection for hamartoma      lungs  . Rul for hamartoma      lung  . Coronary artery bypass graft    . Cardiac catheterization  03 28 2007    NORMAL LV FUNCTION/ ABDOMINAL AORTA STENOSIS,75%-85%. RIGHT FEMORAL ARTERY :CATHETERS USED A  4-FRENCH WITH A 4-FRENCH SHEATH  . Colonoscopy N/A 05/24/2013    Procedure: COLONOSCOPY;  Surgeon: Iva Boop, MD;  Location: [facility];  Service: Endoscopy;  Laterality: N/A;  . Amputation Right 10/08/2015    Procedure: RIGHT FOOT TRANSMET AMPUTATION;  Surgeon: Toni Arthurs, MD;  Location: [facility] OR;  Service: Orthopedics;  Laterality: Right;  . Achilles tendon lengthening Right 10/08/2015    Procedure: ACHILLES TENDON LENGTHENING;  Surgeon: Toni Arthurs, MD;  Location: [facility] OR;  Service: Orthopedics;  Laterality: Right;    Allergies  Allergen Reactions  . Prednisone Other (See Comments)    Makes pt jittery and nervous    I  have reviewed the patient's current medications . amLODipine  10 mg Oral Daily  . aspirin EC  81 mg Oral Daily  . azithromycin  500 mg Intravenous Q24H  . cefTRIAXone (ROCEPHIN) IVPB 1 gram/50 mL D5W  1 g Intravenous Q24H  . guaiFENesin  600 mg Oral BID  . ipratropium-albuterol  3 mL Nebulization TID  . methylPREDNISolone (SOLU-MEDROL) injection  60 mg Intravenous Q12H  . mometasone-formoterol  2 puff Inhalation BID  . sertraline  50 mg Oral Daily  . simvastatin  20 mg Oral Daily  . sodium chloride flush  3 mL Intravenous Q12H  . sodium polystyrene  30 g Oral Q4H  . Warfarin - Pharmacist Dosing Inpatient   Does not apply q1800     acetaminophen **OR** acetaminophen, HYDROcodone-acetaminophen, levalbuterol, ondansetron **OR** ondansetron (ZOFRAN) IV  Medication Sig  amLODipine (NORVASC) 10 MG tablet Take 1 tablet (10 mg total) by mouth daily.  aspirin EC 81 MG tablet Take 81 mg by mouth daily.  budesonide-formoterol (SYMBICORT) 160-4.5 MCG/ACT inhaler Inhale 2 puffs into the lungs 2 (two) times daily.  cholecalciferol (VITAMIN D) 1000 UNITS tablet Take 1,000 Units by mouth daily.  furosemide (LASIX) 40 MG tablet TAKE ONE TABLET BY MOUTH EVERY MORNING  ipratropium-albuterol (DUONEB) 0.5-2.5 (3) MG/3ML SOLN Take 3 mLs by nebulization 4 (four) times daily.   KLOR-CON M20 20 MEQ tablet TAKE 1 TABLET (20 MEQ TOTAL) BY MOUTH DAILY.  OXYGEN Inhale 2 L into the lungs as needed (shortness of breath).   sertraline (ZOLOFT) 50 MG tablet Take 50 mg by mouth daily.  simvastatin (ZOCOR) 40 MG tablet Take 20 mg by mouth daily.  warfarin (COUMADIN) 2.5 MG tablet TAKE 1-2 TABLETS BY MOUTH DAILY AS DIRECTED BY COUMADIN CLINIC Patient taking differently: Take 2.5 mg by mouth daily. OR AS DIRECTED BY COUMADIN CLINIC  docusate sodium (COLACE) 100 MG capsule Take 1 capsule (100 mg total) by mouth 2 (two) times daily. While taking narcotic pain medicine. Patient not taking: Reported on 11/01/2015  oxyCODONE  (ROXICODONE) 5 MG immediate release tablet Take 1-2 tablets (5-10 mg total) by mouth every 4 (four) hours as needed for moderate pain or severe pain. Patient not taking: Reported on 11/01/2015  senna (SENOKOT) 8.6 MG TABS tablet Take 2 tablets (17.2 mg total) by mouth 2 (two) times daily. Patient not taking: Reported on 11/01/2015     Social History   Social History  . Marital Status: Married    Spouse Name: N/A  . Number of Children: 2  . Years of Education: N/A   Occupational History  . Retail Sales    Social History Main Topics  . Smoking status: Former Smoker -- 2.00 packs/day for 55 years  Types: Cigarettes    Quit date: 05/23/1998  . Smokeless tobacco: Never Used  . Alcohol Use: No  . Drug Use: No  . Sexual Activity: Not on file   Other Topics Concern  . Not on file   Social History Narrative   He is a married father of 2, grandfather of 70, does not get routine exercise. He is a former smoker, but does not currently or not drink alcohol.    He is modifying his diet and trying to get activity but is doing better with the diet than the activity.    He is under a lot of stress.    He was the caregiver for his wife, who was in poor health. She died 13-Nov-2015.    Family Status  Relation Status Death Age  . Mother Deceased   . Father Deceased    Family History  Problem Relation Age of Onset  . Heart attack Mother   . Heart attack Father      ROS:  Full 14 point review of systems complete and found to be negative unless listed above.  Physical Exam: Blood pressure 123/97, pulse 69, temperature 98.4 F (36.9 C), temperature source Oral, resp. rate 26, height 6' (1.829 m), weight 201 lb 8 oz (91.4 kg), SpO2 95 %.  General: Well developed, well nourished, male in no acute distress Head: Eyes PERRLA, No xanthomas.   Normocephalic and atraumatic, oropharynx without edema or exudate. Dentition: poor Lungs: bibasilar rales Heart: Heart irregular rate and rhythm with  S1, S2, no sig murmur. pulses are 2+ both upper extrem, decreased in both lower extrem Neck: No carotid bruits. No lymphadenopathy.  JVD elevated to jaw Abdomen: Bowel sounds present, abdomen soft and non-tender without masses or hernias noted. Msk:  No spine or cva tenderness. No weakness, no joint deformities or effusions. Extremities: No clubbing or cyanosis. No edema.  R foot bandaged and dressed, not disturbed Neuro: Alert and oriented X 3. No focal deficits noted. Psych:  Good affect, responds appropriately Skin: No rashes or lesions noted.  Labs:   Lab Results  Component Value Date   WBC 11.6* 11/02/2015   HGB 12.5* 11/02/2015   HCT 41.4 11/02/2015   MCV 81.3 11/02/2015   PLT 170 11/02/2015    Recent Labs  11/02/15 0453  INR 2.98*     Recent Labs Lab 11/02/15 0453  NA 137  K 5.7*  CL 101  CO2 28  BUN 33*  CREATININE 1.81*  CALCIUM 9.0  PROT 6.6  BILITOT 0.8  ALKPHOS 63  ALT 13*  AST 16  GLUCOSE 141*  ALBUMIN 3.0*   MAGNESIUM  Date Value Ref Range Status  11/02/2015 2.3 1.7 - 2.4 mg/dL Final    Recent Labs  16/10/96 1846 11/01/15 2302 11/02/15 0453 11/02/15 1030  TROPONINI 0.09* 0.08* 0.05* 0.19*    Recent Labs  11/01/15 1851  TROPIPOC 0.02   B NATRIURETIC PEPTIDE  Date/Time Value Ref Range Status  11/02/2015 04:53 AM 710.5* 0.0 - 100.0 pg/mL Final  11/01/2015 06:46 PM 399.0* 0.0 - 100.0 pg/mL Final   TSH  Date/Time Value Ref Range Status  11/02/2015 04:53 AM 0.137* 0.350 - 4.500 uIU/mL Final  09/06/2007 05:35 AM 0.372 **Test methodology is 3rd generation TSH**  Final    Echo: 05/2013 - Left ventricle: The cavity size was normal. Wall thickness was normal. Systolic function was normal. The estimated ejection fraction was in the range of 60% to 65%. Images were inadequate for LV wall  motion assessment. LV diastolic function cannot be assessed. - Aortic valve: Sclerosis without stenosis. Transvalvular velocity was  minimally increased. No regurgitation. - Left atrium: The atrium was normal in size. - Right atrium: The atrium was normal in size. - Atrial septum: No defect or patent foramen ovale was identified. - Pulmonic valve: Peak gradient: 11mm Hg (S). Elevated gradient appears to be due to high CO - no evidence for PS. - Pulmonary arteries: PA peak pressure: 44mm Hg (S). - Inferior vena cava: The vessel was normal in size; the respirophasic diameter changes were in the normal range (= 50%); findings are consistent with normal central venous pressure. - Pericardium, extracardiac: Apical echo free space, may represent a fat pad.  ECG:  11/01/2015 Atrial fib, RBBB (old), rate is controlled  Radiology:  Dg Chest 2 View 11/02/2015  CLINICAL DATA:  Shortness of breath. EXAM: CHEST  2 VIEW COMPARISON:  11/01/2015. 10/06/2015 05/20/2013. 05/07/2013. 09/01/2011. FINDINGS: Prior CABG. Cardiomegaly with normal pulmonary vascularity. Mild interstitial prominence noted suggesting mild interstitial edema. Right base pleural parenchymal thickening again noted consistent scarring. Underlying COPD cannot be excluded. Stable left apical pulmonary nodule again noted. IMPRESSION: 1. Prior CABG. Cardiomegaly with mild interstitial prominence noted bilaterally consistent with mild interstitial edema. 2. Right base pleural-parenchymal scarring again noted. COPD. Stable left apical pulmonary nodule unchanged from multiple prior exams dating to 09/01/2011. Electronically Signed   By: Maisie Fushomas  Register   On: 11/02/2015 07:50   Dg Chest Port 1 View 11/01/2015  CLINICAL DATA:  Acute onset of shortness of breath and productive cough. Respiratory distress. Initial encounter. EXAM: PORTABLE CHEST 1 VIEW COMPARISON:  Chest radiograph performed 10/08/2015 FINDINGS: A small right pleural effusion is noted. Vascular congestion is seen. Increased interstitial markings raise concern for mild interstitial edema. No no  pneumothorax is seen. The cardiomediastinal silhouette is borderline normal in size. The patient is status post median sternotomy, with evidence of prior CABG. No acute osseous abnormalities are seen. IMPRESSION: Small right pleural effusion noted. Vascular congestion seen. Increased interstitial markings raise concern for mild interstitial edema. Electronically Signed   By: Roanna RaiderJeffery  Chang M.D.   On: 11/01/2015 19:22    ASSESSMENT AND PLAN:   The patient was seen today by Dr Allyson SabalBerry, the patient evaluated and the data reviewed.  Active Problems:   CAD in native artery - LM, RCA, RI & Cx disease --> CABG x 4 (LIMA-LAD, SVG-OM/RI, SVG-rPDA-rPL) - emphasize CRF reduction - echo ordered - on ASA 81 mg, statin, no BB 2nd resp issues    Atrial fibrillation, chronic / persistent - rate control ok w/out BB    Dyslipidemia, goal LDL below 70 - on statin, follow profile    Essential hypertension - good control on current rx    COPD mixed type (HCC) - per IM    Pleural effusion - believe he will benefit from diuresis - volume overload by exam and CXR    Elevated troponin - troponin mildly elevated, not consistent w/ acute vessel closure - more consistent with CHF - f/u on echo    Acute renal failure (ARF) (HCC) - follow with diuresis    Hyperkalemia - pt given Kayexalateby IM  Otherwise, per IM   Sepsis (HCC)   Pneumonia   COPD exacerbation (HCC)   SignedTheodore Demark: Barrett, Rhonda, PA-C 11/02/2015 1:44 PM Beeper [phone]  Co-Sign MD  Agree with note by Theodore Demarkhonda Barrett PA-C  Pt admitted with vol overload CHF. Remote H/O CABG 10 years ago w/o eval since. Other  probs as outlined including CAF on OAC. Unfortunately pt's wife died a week ago and he has had dietary indiscretion since. Admitted with increasing SOB. Trop mildly elevated. BNP elevated . No CP. CXR shows small right pleural effusion and interstitial changes. EKG AFIB with CVR / RBBB. SCr mildly elevated. He would benefit from  diuresis. 2D pending. Will follow with you. Suspect mild trop elev secondary to CHF.   Runell Gess, M.D., FACP, [facility], [facility] [address] [place], [place]  [address]  [phone] 11/02/2015 2:12 PM

## 2015-11-02 NOTE — Progress Notes (Signed)
ANTICOAGULATION CONSULT NOTE  Pharmacy Consult for Coumadin Indication: atrial fibrillation  Allergies  Allergen Reactions  . Prednisone Other (See Comments)    Makes pt jittery and nervous    Patient Measurements: Height: 6' (182.9 cm) Weight: 201 lb 8 oz (91.4 kg) IBW/kg (Calculated) : 77.6  Vital Signs: Temp: 97.8 F (36.6 C) (06/12 0801) Temp Source: Oral (06/12 0801) BP: 135/75 mmHg (06/12 0846) Pulse Rate: 83 (06/12 0846)  Labs:  Recent Labs  10/30/15 1202 11/01/15 1846 11/01/15 2302 11/02/15 0453  HGB  --  13.1  --  12.5*  HCT  --  42.6  --  41.4  PLT  --  188  --  170  LABPROT  --   --   --  30.4*  INR 3.4  --   --  2.98*  CREATININE  --  1.73*  --  1.81*  TROPONINI  --  0.09* 0.08* 0.05*    Estimated Creatinine Clearance: 36.3 mL/min (by C-G formula based on Cr of 1.81).  Assessment: 82 y.o. male admitted with SOB/COPD exacerbation, h/o Afib- on warfarin PTA. Was seen in anticoagulation clinic on 6/9- pt was slightly confused and distraught (wife recently passed away) and was adamant he was taking warfarin 5mg  on MWF and nothing on any other days. His INR that day was 3.4- was instructed to take 1 tablet (2.5mg ) daily and would go for recheck in 2 weeks.  INR this morning on upper end of normal at 2.98. hgb 12.5, plts 170- no bleeding noted.  Goal of Therapy:  INR 2-3 Monitor platelets by anticoagulation protocol: Yes   Plan:  -hold warfarin tonight as INR close to being too high -daily INR -follow for s/s bleeding and changes to medications (antibiotics patient is on could potentiate INR)  Aftin Lye D. Abdoul Encinas, PharmD, BCPS Clinical Pharmacist Pager: [phone] 11/02/2015 10:09 AM

## 2015-11-02 NOTE — Progress Notes (Signed)
PROGRESS NOTE    Roberto Reilly  ZOX:[id number] DOB: [birth year] DOA: 11/01/2015 PCP: Pearson Grippe, MD (Confirm with patient/family/NH records and if not entered, this HAS to be entered at [facility] point of entry. "No PCP" if truly none.)   Brief Narrative: (Start on day 1 of progress note - keep it brief and live) COPD exacerbation, 82 yo male with cad, atrial fib, htn. Responding well to medical therapy.  Assessment & Plan:   Active Problems:   CAD in native artery - LM, RCA, RI & Cx disease --> CABG x 4 (LIMA-LAD, SVG-OM/RI, SVG-rPDA-rPL)   Atrial fibrillation, chronic / persistent   Dyslipidemia, goal LDL below 70   Essential hypertension   COPD mixed type (HCC)   Pleural effusion   Elevated troponin   Acute renal failure (ARF) (HCC)   Sepsis (HCC)   Pneumonia   COPD exacerbation (HCC)  1. Cardiovascular. Patient clinically euvolemic, will continue telemetry monitor, heart rate at 79 and 69 bpm, will continue to hold on IV fluids,  Continue amlodipine for blood pressure control. INR at 2.98. Patient had one dose of furosemide on admission.  2. Pulmonary. Will continue to monitor oxymetry, supplemental 02 per [place], will continue systemic steroids, chest film personally reviewed noted loss of lung volume at the right base (post surgical), with increase lung markings, will continue supportive care, will d.c ceftriaxone, pna ruled out.  3, Nephrology,. Renal function with cr at 1.81 with K at 5.7, will order kayexalate and do follow k this pm, avoid hypotension and nephrotoxic medications.  4, Endocrinology. Low tsh will no clinical symptoms of hyperthyroids, will continue observation.  5. dvt px  Patient at moderate risk for worsening copd exacerbation.    DVT prophylaxis: (Lovenox/Heparin/SCD's/anticoagulated/None (if comfort care) Code Status: (Full/Partial - specify details) Family Communication: (Specify name, relationship & date discussed. NO "discussed with patient") Disposition  Plan: (specify when and where you expect patient to be discharged). Include barriers to DC in this tab.   Consultants:     Procedures: (Don't include imaging studies which can be auto populated. Include things that cannot be auto populated i.e. Echo, Carotid and venous dopplers, Foley, Bipap, HD, tubes/drains, wound vac, central lines etc)   Antimicrobials: (specify start and planned stop date. Auto populated tables are space occupying and do not give end dates)  azithromycin   Subjective: Patient with improved dyspnea, no chest pain or significant cough. No nausea or vomiting. Dyspnea is moderate in intensity worse with movement improved with supplemental 02 and current medical therapy.  Objective: Filed Vitals:   11/02/15 0600 11/02/15 0801 11/02/15 0846 11/02/15 1213  BP: 122/62 135/75 135/75 123/97  Pulse: 68 87 83 69  Temp:  97.8 F (36.6 C)  98.4 F (36.9 C)  TempSrc:  Oral  Oral  Resp: 19 25 18 26   Height:      Weight:      SpO2: 100% 84% 100% 95%    Intake/Output Summary (Last 24 hours) at 11/02/15 1256 Last data filed at 11/02/15 0930  Gross per 24 hour  Intake 989.67 ml  Output      0 ml  Net 989.67 ml   Filed Weights   11/01/15 1832 11/01/15 2300  Weight: 96.163 kg (212 lb) 91.4 kg (201 lb 8 oz)    Examination:  General exam: no dyspnea or pain. Respiratory system: Bilateral decrease breath sounds with prolonged expiratory phase and expiratory wheezing, positive scattered rhonchi.  Cardiovascular system: S1 & S2 heard, RRR. No JVD,  murmurs, rubs, gallops or clicks. Trace edema. Gastrointestinal system: Abdomen is nondistended, soft and nontender. No organomegaly or masses felt. Normal bowel sounds heard. Central nervous system: Alert and oriented. No focal neurological deficits. Extremities: Symmetric 5 x 5 power. Skin: No rashes, lesions or ulcers Psychiatry: Judgement and insight appear normal. Mood & affect appropriate.     Data Reviewed: I have  personally reviewed following labs and imaging studies  CBC:  Recent Labs Lab 11/01/15 1846 11/02/15 0453  WBC 10.5 11.6*  NEUTROABS 8.4*  --   HGB 13.1 12.5*  HCT 42.6 41.4  MCV 80.7 81.3  PLT 188 170   Basic Metabolic Panel:  Recent Labs Lab 11/01/15 1846 11/02/15 0453  NA 135 137  K 4.6 5.7*  CL 101 101  CO2 24 28  GLUCOSE 172* 141*  BUN 26* 33*  CREATININE 1.73* 1.81*  CALCIUM 9.0 9.0  MG  --  2.3  PHOS  --  4.7*   GFR: Estimated Creatinine Clearance: 36.3 mL/min (by C-G formula based on Cr of 1.81). Liver Function Tests:  Recent Labs Lab 11/01/15 1846 11/02/15 0453  AST 20 16  ALT 15* 13*  ALKPHOS 69 63  BILITOT 1.2 0.8  PROT 7.3 6.6  ALBUMIN 3.4* 3.0*   No results for input(s): LIPASE, AMYLASE in the last 168 hours. No results for input(s): AMMONIA in the last 168 hours. Coagulation Profile:  Recent Labs Lab 10/30/15 1202 11/02/15 0453  INR 3.4 2.98*   Cardiac Enzymes:  Recent Labs Lab 11/01/15 1846 11/01/15 2302 11/02/15 0453 11/02/15 1030  TROPONINI 0.09* 0.08* 0.05* 0.19*   BNP (last 3 results) No results for input(s): PROBNP in the last 8760 hours. HbA1C: No results for input(s): HGBA1C in the last 72 hours. CBG:  Recent Labs Lab 11/02/15 0021  GLUCAP 118*   Lipid Profile: No results for input(s): CHOL, HDL, LDLCALC, TRIG, CHOLHDL, LDLDIRECT in the last 72 hours. Thyroid Function Tests:  Recent Labs  11/02/15 0453  TSH 0.137*   Anemia Panel: No results for input(s): VITAMINB12, FOLATE, FERRITIN, TIBC, IRON, RETICCTPCT in the last 72 hours. Sepsis Labs:  Recent Labs Lab 11/01/15 1853 11/01/15 2200  LATICACIDVEN 1.78 1.24    Recent Results (from the past 240 hour(s))  MRSA PCR Screening     Status: None   Collection Time: 11/02/15 12:17 AM  Result Value Ref Range Status   MRSA by PCR NEGATIVE NEGATIVE Final    Comment:        The GeneXpert MRSA Assay (FDA approved for NASAL specimens only), is one  component of a comprehensive MRSA colonization surveillance program. It is not intended to diagnose MRSA infection nor to guide or monitor treatment for MRSA infections.   Culture, expectorated sputum-assessment     Status: None   Collection Time: 11/02/15  5:06 AM  Result Value Ref Range Status   Specimen Description SPUTUM  Final   Special Requests NONE  Final   Sputum evaluation   Final    THIS SPECIMEN IS ACCEPTABLE. RESPIRATORY CULTURE REPORT TO FOLLOW.   Report Status 11/02/2015 FINAL  Final  Culture, respiratory (NON-Expectorated)     Status: None (Preliminary result)   Collection Time: 11/02/15  5:06 AM  Result Value Ref Range Status   Specimen Description SPUTUM  Final   Special Requests NONE  Final   Gram Stain   Final    ABUNDANT WBC PRESENT,BOTH PMN AND MONONUCLEAR MODERATE GRAM POSITIVE COCCI IN PAIRS FEW GRAM VARIABLE ROD  Culture PENDING  Incomplete   Report Status PENDING  Incomplete         Radiology Studies: Dg Chest 2 View  11/02/2015  CLINICAL DATA:  Shortness of breath. EXAM: CHEST  2 VIEW COMPARISON:  11/01/2015. 10/06/2015 05/20/2013. 05/07/2013. 09/01/2011. FINDINGS: Prior CABG. Cardiomegaly with normal pulmonary vascularity. Mild interstitial prominence noted suggesting mild interstitial edema. Right base pleural parenchymal thickening again noted consistent scarring. Underlying COPD cannot be excluded. Stable left apical pulmonary nodule again noted. IMPRESSION: 1. Prior CABG. Cardiomegaly with mild interstitial prominence noted bilaterally consistent with mild interstitial edema. 2. Right base pleural-parenchymal scarring again noted. COPD. Stable left apical pulmonary nodule unchanged from multiple prior exams dating to 09/01/2011. Electronically Signed   By: Maisie Fus  Register   On: 11/02/2015 07:50   Dg Chest Port 1 View  11/01/2015  CLINICAL DATA:  Acute onset of shortness of breath and productive cough. Respiratory distress. Initial encounter.  EXAM: PORTABLE CHEST 1 VIEW COMPARISON:  Chest radiograph performed 10/08/2015 FINDINGS: A small right pleural effusion is noted. Vascular congestion is seen. Increased interstitial markings raise concern for mild interstitial edema. No no pneumothorax is seen. The cardiomediastinal silhouette is borderline normal in size. The patient is status post median sternotomy, with evidence of prior CABG. No acute osseous abnormalities are seen. IMPRESSION: Small right pleural effusion noted. Vascular congestion seen. Increased interstitial markings raise concern for mild interstitial edema. Electronically Signed   By: Roanna Raider M.D.   On: 11/01/2015 19:22        Scheduled Meds: . amLODipine  10 mg Oral Daily  . aspirin EC  81 mg Oral Daily  . azithromycin  500 mg Intravenous Q24H  . cefTRIAXone (ROCEPHIN) IVPB 1 gram/50 mL D5W  1 g Intravenous Q24H  . guaiFENesin  600 mg Oral BID  . ipratropium-albuterol  3 mL Nebulization TID  . methylPREDNISolone (SOLU-MEDROL) injection  60 mg Intravenous Q12H  . mometasone-formoterol  2 puff Inhalation BID  . sertraline  50 mg Oral Daily  . simvastatin  20 mg Oral Daily  . sodium chloride flush  3 mL Intravenous Q12H  . sodium polystyrene  30 g Oral Q4H  . Warfarin - Pharmacist Dosing Inpatient   Does not apply q1800   Continuous Infusions:    LOS: 1 day        Coralie Keens, MD [facility] Pager [phone]  If 7PM-7AM, please contact night-coverage [email or web address] Password [facility] 11/02/2015, 12:56 PM

## 2015-11-02 NOTE — Progress Notes (Signed)
  PHARMACY - PHYSICIAN COMMUNICATION CRITICAL VALUE ALERT - BLOOD CULTURE IDENTIFICATION (BCID)  Results for orders placed or performed during the hospital encounter of 11/01/15  Blood Culture ID Panel (Reflexed) (Collected: 11/01/2015  6:47 PM)  Result Value Ref Range   Enterococcus species NOT DETECTED NOT DETECTED   Vancomycin resistance NOT DETECTED NOT DETECTED   Listeria monocytogenes NOT DETECTED NOT DETECTED   Staphylococcus species NOT DETECTED NOT DETECTED   Staphylococcus aureus NOT DETECTED NOT DETECTED   Methicillin resistance NOT DETECTED NOT DETECTED   Streptococcus species NOT DETECTED NOT DETECTED   Streptococcus agalactiae NOT DETECTED NOT DETECTED   Streptococcus pneumoniae NOT DETECTED NOT DETECTED   Streptococcus pyogenes NOT DETECTED NOT DETECTED   Acinetobacter baumannii NOT DETECTED NOT DETECTED   Enterobacteriaceae species NOT DETECTED NOT DETECTED   Enterobacter cloacae complex NOT DETECTED NOT DETECTED   Escherichia coli NOT DETECTED NOT DETECTED   Klebsiella oxytoca NOT DETECTED NOT DETECTED   Klebsiella pneumoniae NOT DETECTED NOT DETECTED   Proteus species NOT DETECTED NOT DETECTED   Serratia marcescens NOT DETECTED NOT DETECTED   Carbapenem resistance NOT DETECTED NOT DETECTED   Haemophilus influenzae DETECTED (A) NOT DETECTED   Neisseria meningitidis NOT DETECTED NOT DETECTED   Pseudomonas aeruginosa NOT DETECTED NOT DETECTED   Candida albicans NOT DETECTED NOT DETECTED   Candida glabrata NOT DETECTED NOT DETECTED   Candida krusei NOT DETECTED NOT DETECTED   Candida parapsilosis NOT DETECTED NOT DETECTED   Candida tropicalis NOT DETECTED NOT DETECTED    Name of physician (or Provider) Contacted: [facility] extender - text paged  Changes to prescribed antibiotics required: recommended restart ceftriaxone 2gm IV q24  Roberto SauersLisa Cadarius Reilly Pharm.D. CPP, BCPS Clinical Pharmacist [phone] 11/02/2015 9:34 PM

## 2015-11-02 NOTE — Plan of Care (Signed)
Problem: Respiratory: Goal: Ability to maintain a clear airway will improve Outcome: Progressing Discussed with patient importance to cough up and out to not swallow his sputum. teachback with return demostration

## 2015-11-02 NOTE — Progress Notes (Signed)
ABG delayed, Therapist in Code X2 ( and 57M).

## 2015-11-02 NOTE — Progress Notes (Signed)
Echocardiogram 2D Echocardiogram with Definity has been performed.  Nolon RodBrown, Tony 11/02/2015, 3:56 PM

## 2015-11-02 NOTE — Progress Notes (Signed)
Spoke to Admission nurse Harriett Sineancy who is aware that admission documentation needs to be completed.

## 2015-11-03 ENCOUNTER — Encounter (HOSPITAL_COMMUNITY): Payer: Self-pay | Admitting: Physician Assistant

## 2015-11-03 ENCOUNTER — Inpatient Hospital Stay (HOSPITAL_COMMUNITY): Payer: Medicare Other

## 2015-11-03 DIAGNOSIS — J9601 Acute respiratory failure with hypoxia: Secondary | ICD-10-CM

## 2015-11-03 DIAGNOSIS — I480 Paroxysmal atrial fibrillation: Secondary | ICD-10-CM

## 2015-11-03 DIAGNOSIS — J449 Chronic obstructive pulmonary disease, unspecified: Secondary | ICD-10-CM

## 2015-11-03 DIAGNOSIS — J9620 Acute and chronic respiratory failure, unspecified whether with hypoxia or hypercapnia: Secondary | ICD-10-CM

## 2015-11-03 DIAGNOSIS — R7989 Other specified abnormal findings of blood chemistry: Secondary | ICD-10-CM

## 2015-11-03 DIAGNOSIS — I10 Essential (primary) hypertension: Secondary | ICD-10-CM

## 2015-11-03 LAB — PROTIME-INR
INR: 4 — AB (ref 0.00–1.49)
PROTHROMBIN TIME: 38 s — AB (ref 11.6–15.2)

## 2015-11-03 LAB — CBC
HCT: 37.5 % — ABNORMAL LOW (ref 39.0–52.0)
HEMOGLOBIN: 11.3 g/dL — AB (ref 13.0–17.0)
MCH: 24 pg — ABNORMAL LOW (ref 26.0–34.0)
MCHC: 30.1 g/dL (ref 30.0–36.0)
MCV: 79.8 fL (ref 78.0–100.0)
PLATELETS: 188 10*3/uL (ref 150–400)
RBC: 4.7 MIL/uL (ref 4.22–5.81)
RDW: 18 % — ABNORMAL HIGH (ref 11.5–15.5)
WBC: 11 10*3/uL — ABNORMAL HIGH (ref 4.0–10.5)

## 2015-11-03 LAB — BASIC METABOLIC PANEL
Anion gap: 11 (ref 5–15)
BUN: 46 mg/dL — ABNORMAL HIGH (ref 6–20)
CHLORIDE: 97 mmol/L — AB (ref 101–111)
CO2: 30 mmol/L (ref 22–32)
CREATININE: 1.36 mg/dL — AB (ref 0.61–1.24)
Calcium: 8.2 mg/dL — ABNORMAL LOW (ref 8.9–10.3)
GFR, EST AFRICAN AMERICAN: 55 mL/min — AB (ref >60)
GFR, EST NON AFRICAN AMERICAN: 48 mL/min — AB (ref >60)
Glucose, Bld: 123 mg/dL — ABNORMAL HIGH (ref 65–99)
Potassium: 4.4 mmol/L (ref 3.5–5.1)
SODIUM: 138 mmol/L (ref 135–145)

## 2015-11-03 LAB — HEMOGLOBIN A1C
HEMOGLOBIN A1C: 5.6 % (ref 4.8–5.6)
MEAN PLASMA GLUCOSE: 114 mg/dL

## 2015-11-03 MED ORDER — AZITHROMYCIN 500 MG PO TABS
500.0000 mg | ORAL_TABLET | Freq: Every day | ORAL | Status: DC
  Administered 2015-11-03: 500 mg via ORAL
  Filled 2015-11-03: qty 1

## 2015-11-03 MED ORDER — DEXTROSE 5 % IV SOLN
2.0000 g | INTRAVENOUS | Status: DC
  Administered 2015-11-03 – 2015-11-04 (×2): 2 g via INTRAVENOUS
  Filled 2015-11-03 (×4): qty 2

## 2015-11-03 NOTE — Progress Notes (Signed)
ANTICOAGULATION CONSULT NOTE  Pharmacy Consult for Coumadin Indication: atrial fibrillation  Allergies  Allergen Reactions  . Prednisone Other (See Comments)    Makes pt jittery and nervous    Patient Measurements: Height: 6' (182.9 cm) Weight: 201 lb 8 oz (91.4 kg) IBW/kg (Calculated) : 77.6  Vital Signs: Temp: 97.7 F (36.5 C) (06/13 0827) Temp Source: Oral (06/13 0827) BP: 116/58 mmHg (06/13 0929) Pulse Rate: 82 (06/13 0827)  Labs:  Recent Labs  11/01/15 1846 11/01/15 2302 11/02/15 0453 11/02/15 1030 11/03/15 0258  HGB 13.1  --  12.5*  --  11.3*  HCT 42.6  --  41.4  --  37.5*  PLT 188  --  170  --  188  LABPROT  --   --  30.4*  --  38.0*  INR  --   --  2.98*  --  4.00*  CREATININE 1.73*  --  1.81*  --  1.36*  TROPONINI 0.09* 0.08* 0.05* 0.19*  --     Estimated Creatinine Clearance: 48.3 mL/min (by C-G formula based on Cr of 1.36).  Assessment: 82 y.o. male admitted with SOB/COPD exacerbation, h/o Afib- on warfarin PTA. Was seen in anticoagulation clinic on 6/9- pt was slightly confused and distraught (wife recently passed away) and was certain he was taking warfarin 5mg  on MWF and nothing on any other days. His INR that day was 3.4- was instructed to take 1 tablet (2.5mg ) daily and would go for recheck in 2 weeks.  INR on admission was on upper end of normal at 2.98. He was started on azithromycin/ceftriaxone along with steroids.  INR this morning jumped to 4 despite not receiving warfarin last night (likely d/t interaction with abx and steroids)  hgb 11.3, plts 188- no bleeding noted.  Goal of Therapy:  INR 2-3 Monitor platelets by anticoagulation protocol: Yes   Plan:  -hold warfarin tonight -daily INR -follow for s/s bleeding and changes to medications -BCID positive for HFlu- NP paged last night and MD paged again this morning about restarting ceftriaxone  Esperanza Madrazo D. Dejha King, PharmD, BCPS Clinical Pharmacist Pager: [phone] 11/03/2015 10:28 AM

## 2015-11-03 NOTE — Care Management Note (Signed)
Case Management Note  Patient Details  Name: Roberto Reilly MRN: [id number] Date of Birth: 03/04/1934   Subjective/Objective:    Pt reports his wife died recently and he now lives alone but his daughter checks on him at least twice weekly, helps him with his medicine and any other needs he may have.  Pt requesting CM determine whether he can apply to get a benefit from his deceased wife's social security.  [facility] as well as contact information for pt.               Expected Discharge Plan:  Home w Home Health Services  Discharge planning Services  CM Consult  Status of Service:  In process, will continue to follow  Magdalene RiverMayo, Josette Shimabukuro T, RN 11/03/2015, 3:29 PM

## 2015-11-03 NOTE — Progress Notes (Signed)
Report called to 5W. Will transport when NT available.

## 2015-11-03 NOTE — Progress Notes (Signed)
Patient AOx4 wearing 3LNC, resting comfortably in chair at bedside report. Vitals stable, patient has no complaints at this time. Will be transported to 5W by NT. Per day RN, report already given to nurse on 5W.

## 2015-11-03 NOTE — Clinical Documentation Improvement (Signed)
Internal Medicine  Can the diagnosis of  " Grade 1 diastolic dysfunction" be further specified? Thank you     Acuity - Acute, Chronic, Acute on Chronic   Type - Systolic, Diastolic, Systolic and Diastolic  Other  Clinically Undetermined   Document any associated diagnoses/conditions   Supporting Information: BNP incr 399.0  To 710.5    Treatment: IV Lasix 40 mg given in ED      Please exercise your independent, professional judgment when responding. A specific answer is not anticipated or expected.   Thank You,  Lavonda JumboLawanda J Kodi Guerrera [employer] [place] [phone]

## 2015-11-03 NOTE — Progress Notes (Signed)
Patient being packed up for transfer to 5W. NT at bedside with wheelchair. Scheduled MDI/Neb not given at this time.

## 2015-11-03 NOTE — Progress Notes (Signed)
Patient: Roberto Reilly / Admit Date: 11/01/2015 / Date of Encounter: 11/03/2015, 12:46 PM   Subjective: Feels well, no complaints. Still wheezy on exam. Says his wife ended up only seeing her own doctor twice in the last 10 years she was alive because she kept having appointments with PAs instead.   Objective: Telemetry: irregular rhythm - appears to be PAF but at times possibly MAT - at times with controlled rates he has clear cut P waves of varying morphology suggestive of WAP Physical Exam: Blood pressure 111/55, pulse 91, temperature 98.4 F (36.9 C), temperature source Oral, resp. rate 22, height 6' (1.829 m), weight 201 lb 8 oz (91.4 kg), SpO2 98 %. General: Well developed, well nourished WM, in no acute distress.  Head: Normocephalic, atraumatic, sclera non-icteric, no xanthomas, nares are without discharge. Neck: JVP not elevated. Lungs: Diffusely wheezy with moderately reduced air movement. Breathing is unlabored. Heart: Irregular, rate controlled, S1 S2 without murmurs, rubs, or gallops.  Abdomen: Soft, non-tender, non-distended with normoactive bowel sounds. No rebound/guarding. Extremities: No clubbing or cyanosis. No edema. Distal pedal pulses are 2+ and equal bilaterally. Neuro: Alert and oriented X 3. Moves all extremities spontaneously. Hard of hearing. Psych:  Responds to questions appropriately with a normal affect.   Intake/Output Summary (Last 24 hours) at 11/03/15 1246 Last data filed at 11/03/15 0944  Gross per 24 hour  Intake    303 ml  Output   1000 ml  Net   -697 ml    Inpatient Medications:  . amLODipine  10 mg Oral Daily  . aspirin EC  81 mg Oral Daily  . cefTRIAXone (ROCEPHIN)  IV  2 g Intravenous Q24H  . guaiFENesin  600 mg Oral BID  . ipratropium-albuterol  3 mL Nebulization TID  . methylPREDNISolone (SOLU-MEDROL) injection  60 mg Intravenous Q12H  . mometasone-formoterol  2 puff Inhalation BID  . sertraline  50 mg Oral Daily  . simvastatin  20 mg  Oral Daily  . sodium chloride flush  3 mL Intravenous Q12H  . Warfarin - Pharmacist Dosing Inpatient   Does not apply q1800   Infusions:    Labs:  Recent Labs  11/02/15 0453 11/02/15 2017 11/03/15 0258  NA 137  --  138  K 5.7* 4.1 4.4  CL 101  --  97*  CO2 28  --  30  GLUCOSE 141*  --  123*  BUN 33*  --  46*  CREATININE 1.81*  --  1.36*  CALCIUM 9.0  --  8.2*  MG 2.3  --   --   PHOS 4.7*  --   --     Recent Labs  11/01/15 1846 11/02/15 0453  AST 20 16  ALT 15* 13*  ALKPHOS 69 63  BILITOT 1.2 0.8  PROT 7.3 6.6  ALBUMIN 3.4* 3.0*    Recent Labs  11/01/15 1846 11/02/15 0453 11/03/15 0258  WBC 10.5 11.6* 11.0*  NEUTROABS 8.4*  --   --   HGB 13.1 12.5* 11.3*  HCT 42.6 41.4 37.5*  MCV 80.7 81.3 79.8  PLT 188 170 188    Recent Labs  11/01/15 1846 11/01/15 2302 11/02/15 0453 11/02/15 1030  TROPONINI 0.09* 0.08* 0.05* 0.19*   Invalid input(s): POCBNP  Recent Labs  11/02/15 0453  HGBA1C 5.6     Radiology/Studies:  Dg Chest 2 View  11/02/2015  CLINICAL DATA:  Shortness of breath. EXAM: CHEST  2 VIEW COMPARISON:  11/01/2015. 10/06/2015 05/20/2013. 05/07/2013. 09/01/2011. FINDINGS: Prior CABG. Cardiomegaly  with normal pulmonary vascularity. Mild interstitial prominence noted suggesting mild interstitial edema. Right base pleural parenchymal thickening again noted consistent scarring. Underlying COPD cannot be excluded. Stable left apical pulmonary nodule again noted. IMPRESSION: 1. Prior CABG. Cardiomegaly with mild interstitial prominence noted bilaterally consistent with mild interstitial edema. 2. Right base pleural-parenchymal scarring again noted. COPD. Stable left apical pulmonary nodule unchanged from multiple prior exams dating to 09/01/2011. Electronically Signed   By: Maisie Fus  Register   On: 11/02/2015 07:50   Dg Chest 2 View  10/08/2015  CLINICAL DATA:  82 year old male with COPD. Preoperative chest x-ray. Current fever. EXAM: CHEST  2 VIEW  COMPARISON:  Multiple prior chest x-ray, most recent 05/20/2013, 05/07/2013 FINDINGS: Cardiomediastinal silhouette unchanged in size and contour, with cardiomegaly. Tortuosity descending thoracic aorta. Double density of the lower mediastinum, new from the comparison. Surgical changes of prior median sternotomy and CABG. Stigmata of emphysema, with increased retrosternal airspace, flattened hemidiaphragms, increased AP diameter, and hyperinflation on the AP view. No pleural effusion or pneumothorax. Persistent opacity at the right lung base with blunting of the right costophrenic angle and partial obscuration the right hemidiaphragm, status post right lower lobe resection/lobectomy. No displaced fracture.  Accentuated kyphotic curvature. IMPRESSION: Chronic changes at the base of the right lung, similar to comparison, most likely postoperative with no new confluent airspace disease. Changes of emphysema with no pleural effusion. Surgical changes of prior median sternotomy and CABG. Signed, Yvone Neu. Loreta Ave, DO [facility] Radiology Electronically Signed   By: Gilmer Mor D.O.   On: 10/08/2015 10:11   Dg Chest Port 1 View  11/01/2015  CLINICAL DATA:  Acute onset of shortness of breath and productive cough. Respiratory distress. Initial encounter. EXAM: PORTABLE CHEST 1 VIEW COMPARISON:  Chest radiograph performed 10/08/2015 FINDINGS: A small right pleural effusion is noted. Vascular congestion is seen. Increased interstitial markings raise concern for mild interstitial edema. No no pneumothorax is seen. The cardiomediastinal silhouette is borderline normal in size. The patient is status post median sternotomy, with evidence of prior CABG. No acute osseous abnormalities are seen. IMPRESSION: Small right pleural effusion noted. Vascular congestion seen. Increased interstitial markings raise concern for mild interstitial edema. Electronically Signed   By: Roanna Raider M.D.   On: 11/01/2015 19:22     Assessment and Plan  23M with CAD s/p CABG 2007 (no recent stress test per patient preference), COPD with chronic respiratory failure, obesity, HLD, paroxysmal atrial fib on Coumadin, AAA (4.2x4.8 in 2014, patient declined re-eval 2016) who was admitted with fever, SOB requiring bipap, cough productive of green sputum, resp panel + H.Flu. Cardiology seeing for elevated BNP and suspected acute diastolic CHF, also ?atrial fib. 2D echo 11/02/15: EF 55-60%, no RWMA, no evidence of elevated vent filling pressures, mild LAE.  1. Acute on chronic respiratory failure - suspect multifactorial, + H Flu resp infection/AECOPD - per IM.  2. Possible acute diastolic CHF - I suspect his initial presentation was more due to lung disease but with small component of CHF. BNP could've been elevated in setting of acute renal insufficiency. 2D echo with normal LV filling pressures. Weight is actually down compared to prior weights - was 230's in 2016, now listed as 201-212 in the computer from 6/11. He improved s/p one dose of IV Lasix. Will order daily weights/I&O's to follow up. Await fu CXR.  3. Paroxysmal atrial fib - listed as chronic in chart but he was actually in NSR at last OV 02/2015.  EKGs and telemetry this admission show possible atrial fib versus MAT interspersed with NSR with WAP. He is totally unaware of this and does not wish to do anything about rhythm control at this time. Continue anticoagulation.  4. CAD s/p CABG with mildly elevated troponin - suspect demand ischemia. Given normal LV function we can consider nuclear stress test as outpatient if patient decides to go forward. Not on BB due to resp status. Continue ASA and statin.  5. AKI on CKD stage II - baseline Cr 1.1-1.24. 1.8 on admission, down to 1.36.  6. Abnormal TSH - further evaluation per internal medicine. Could be contributing to recurrence of atrial fib if felt to be true.  Signed, Ronie Spiesayna Dunn  PA-C Pager: [phone]   Agree with findings by Ronie Spiesayna Dunn PA-C  Pt only received 1 dose of IV lasix. Has HFpEF and DD. In AFIB with CVR on coumadin A/C (INR supra therapeutic 4). He has scattered basilar crackles and trace periph edema. Would put back on home PO lasix pose. Nothing further to add from cards point of view. Pt can F/U with Dr Herbie BaltimoreHarding as an OP. Will S/O.  Runell GessJonathan J. Karriem Muench, M.D., FACP, [facility] HospitalFACC, Earl LagosFAHA, [facility] CampusFSCAI [facility]yl[facility] HospitalC[facility] [address] [place], [place]  [id number]  [phone] 11/03/2015 3:03 PM

## 2015-11-03 NOTE — Progress Notes (Addendum)
Roberto ChristmasJames Reilly [id number]  Transfer Data: 11/03/2015 7:55 PM  Attending Provider: York RamMauricio Daniel Arrien, *  NFA:[id number]:Roberto Kim, MD  Code Status: DNR  Roberto ChristmasJames Reilly is a 82 y.o. male patient transferred from 2C -No acute distress noted.  -No complaints of shortness of breath.  -No complaints of chest pain.  Cardiac Monitoring:  Box # 26 in place.  Cardiac monitor yields:normal sinus rhythm.   Blood pressure 152/65, pulse 83, temperature 97.6 F (36.4 C), temperature source Oral, resp. rate 20, height 6' (1.829 m), weight 91.4 kg (201 lb 8 oz), SpO2 94 %.  ?  IV Fluids: IV in right AC, saline locked. 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  Past Surgical History:  has past surgical history that includes Cataract extraction; RLL resection for Hamartoma; RUL for hamartoma; Coronary artery bypass graft; Cardiac catheterization (03 28 2007); Colonoscopy (N/A, 05/24/2013); Amputation (Right, 10/08/2015); and Achilles tendon lengthening (Right, 10/08/2015).  Social History:  reports that he quit smoking about 17 years ago. His smoking use included Cigarettes. He has a 110 pack-year smoking history. He has never used smokeless tobacco. He reports that he does not drink alcohol or use illicit drugs.  Skin: intact   Patient orientated to room. Information packet given to patient. Admission  inpatient armband information verified with patient/family to include name and date of birth and placed on patient arm. Side rails up x 2, fall assessment and education completed with patient/family. Patient able to verbalize understanding of risk associated with falls and verbalized understanding to call for assistance before getting out of bed. Call light within reach. Patient able to voice and demonstrate understanding of unit orientation instructions.  Will continue to evaluate and treat per MD orders.

## 2015-11-03 NOTE — Progress Notes (Signed)
PROGRESS NOTE    Roberto Reilly  ZOX:[id number] DOB: August 03, 1933 DOA: 11/01/2015 PCP: Pearson Grippe, MD (Confirm with patient/family/NH records and if not entered, this HAS to be entered at [facility] point of entry. "No PCP" if truly none.)   Brief Narrative: (Start on day 1 of progress note - keep it brief and live) COPD exacerbation, 82 yo male with cad, atrial fib, htn. Responding well to medical therapy.   Assessment & Plan:   Active Problems:   CAD in native artery - LM, RCA, RI & Cx disease --> CABG x 4 (LIMA-LAD, SVG-OM/RI, SVG-rPDA-rPL)   Atrial fibrillation, chronic / persistent   Dyslipidemia, goal LDL below 70   Essential hypertension   COPD mixed type (HCC)   Pleural effusion   Elevated troponin   Acute renal failure (ARF) (HCC)   Sepsis (HCC)   Pneumonia   COPD exacerbation (HCC)  1. Cardiovascular. Atrial fibrillation rate controlled, will continue blood pressure control with amlodipine. Continue anticoagulation with warfarin and asa,  INR 4.0 will d.c warfarin for now. Noted positive blood culture for hemophilus, will resume ceftriaxone, follow blood cultures, and repeat chest film. Wbc at 11.0  2. Pulmonary. Will continue to monitor oxymetry, supplemental 02 per [place], will continue systemic steroids, will follow on chest film, positive blood culure, will resume antibiotic therapy due to likely pneumonia. Continue bronchodilator therapy.  3, Nephrology. Improved K at 4.4, cr at 1,36 from 1,81, will follow renal panel in am, avoid hypotension or nephrotoxic medications.  4. Neurology. Will continue sertraline.   Patient at moderate risk for worsening copd exacerbation. Will transfer out of step down unit.    DVT prophylaxis: (Lovenox/Heparin/SCD's/anticoagulated/None (if comfort care) Code Status: (Full/Partial - specify details) Family Communication: (Specify name, relationship & date discussed. NO "discussed with patient") Disposition Plan: (specify when and where you expect  patient to be discharged). Include barriers to DC in this tab.   Consultants:     Procedures: (Don't include imaging studies which can be auto populated. Include things that cannot be auto populated i.e. Echo, Carotid and venous dopplers, Foley, Bipap, HD, tubes/drains, wound vac, central lines etc)    Antimicrobials: (specify start and planned stop date. Auto populated tables are space occupying and do not give end dates)  ceftriaxone  azithromycin   Subjective: Patient's dyspnea has been improving, moderate in intensity, associated with cough, productie, no worsening with exertion, improved with nebulizations, no nausea or vomiting, no chest pain.  Objective: Filed Vitals:   11/03/15 0500 11/03/15 0600 11/03/15 0749 11/03/15 0827  BP:  119/65 119/65 136/118  Pulse: 58 68 82 82  Temp:    97.7 F (36.5 C)  TempSrc:    Oral  Resp: Height:      Weight:      SpO2: 92% 87% 94% 98%    Intake/Output Summary (Last 24 hours) at 11/03/15 0858 Last data filed at 11/03/15 0828  Gross per 24 hour  Intake    663 ml  Output   1000 ml  Net   -337 ml   Filed Weights   11/01/15 1832 11/01/15 2300  Weight: 96.163 kg (212 lb) 91.4 kg (201 lb 8 oz)    Examination:  General exam: not in pain or dyspnea E ENT: mild conjunctival pallor, oral mucosa moist. Respiratory system: decreased air movement with inspiratory and expiratory rhonchi bilaterally and diffusely, prolonged expiratory phase.  Cardiovascular system: S1 & S2 heard, irregulary irregular, no murmurs, rubs, gallops or clicks.  Trace non pitting edema. Gastrointestinal system: Abdomen is nondistended, soft and nontender. No organomegaly or masses felt. Normal bowel sounds heard. Central nervous system: Alert and oriented. No focal neurological deficits. Extremities: Symmetric 5 x 5 power. Skin: No rashes, lesions or ulcers Psychiatry: Judgement and insight appear normal. Mood & affect appropriate.     Data  Reviewed: I have personally reviewed following labs and imaging studies  CBC:  Recent Labs Lab 11/01/15 1846 11/02/15 0453 11/03/15 0258  WBC 10.5 11.6* 11.0*  NEUTROABS 8.4*  --   --   HGB 13.1 12.5* 11.3*  HCT 42.6 41.4 37.5*  MCV 80.7 81.3 79.8  PLT 188 170 188   Basic Metabolic Panel:  Recent Labs Lab 11/01/15 1846 11/02/15 0453 11/02/15 2017 11/03/15 0258  NA 135 137  --  138  K 4.6 5.7* 4.1 4.4  CL 101 101  --  97*  CO2 24 28  --  30  GLUCOSE 172* 141*  --  123*  BUN 26* 33*  --  46*  CREATININE 1.73* 1.81*  --  1.36*  CALCIUM 9.0 9.0  --  8.2*  MG  --  2.3  --   --   PHOS  --  4.7*  --   --    GFR: Estimated Creatinine Clearance: 48.3 mL/min (by C-G formula based on Cr of 1.36). Liver Function Tests:  Recent Labs Lab 11/01/15 1846 11/02/15 0453  AST 20 16  ALT 15* 13*  ALKPHOS 69 63  BILITOT 1.2 0.8  PROT 7.3 6.6  ALBUMIN 3.4* 3.0*   No results for input(s): LIPASE, AMYLASE in the last 168 hours. No results for input(s): AMMONIA in the last 168 hours. Coagulation Profile:  Recent Labs Lab 10/30/15 1202 11/02/15 0453 11/03/15 0258  INR 3.4 2.98* 4.00*   Cardiac Enzymes:  Recent Labs Lab 11/01/15 1846 11/01/15 2302 11/02/15 0453 11/02/15 1030  TROPONINI 0.09* 0.08* 0.05* 0.19*   BNP (last 3 results) No results for input(s): PROBNP in the last 8760 hours. HbA1C:  Recent Labs  11/02/15 0453  HGBA1C 5.6   CBG:  Recent Labs Lab 11/02/15 0021  GLUCAP 118*   Lipid Profile: No results for input(s): CHOL, HDL, LDLCALC, TRIG, CHOLHDL, LDLDIRECT in the last 72 hours. Thyroid Function Tests:  Recent Labs  11/02/15 0453  TSH 0.137*   Anemia Panel: No results for input(s): VITAMINB12, FOLATE, FERRITIN, TIBC, IRON, RETICCTPCT in the last 72 hours. Sepsis Labs:  Recent Labs Lab 11/01/15 1853 11/01/15 2200  LATICACIDVEN 1.78 1.24    Recent Results (from the past 240 hour(s))  Blood culture (routine x 2)     Status: None  (Preliminary result)   Collection Time: 11/01/15  6:47 PM  Result Value Ref Range Status   Specimen Description BLOOD LEFT ANTECUBITAL  Final   Special Requests BOTTLES DRAWN AEROBIC AND ANAEROBIC  Final   Culture  Setup Time   Final    GRAM NEGATIVE COCCOBACILLI ANAEROBIC BOTTLE ONLY Organism ID to follow CRITICAL RESULT CALLED TO, READ BACK BY AND VERIFIED WITH: L CURRAN PHARMD 2113 11/02/15 A BROWNING    Culture NO GROWTH < 24 HOURS  Final   Report Status PENDING  Incomplete  Blood Culture ID Panel (Reflexed)     Status: Abnormal   Collection Time: 11/01/15  6:47 PM  Result Value Ref Range Status   Enterococcus species NOT DETECTED NOT DETECTED Final   Vancomycin resistance NOT DETECTED NOT DETECTED Final   Listeria monocytogenes NOT DETECTED  NOT DETECTED Final   Staphylococcus species NOT DETECTED NOT DETECTED Final   Staphylococcus aureus NOT DETECTED NOT DETECTED Final   Methicillin resistance NOT DETECTED NOT DETECTED Final   Streptococcus species NOT DETECTED NOT DETECTED Final   Streptococcus agalactiae NOT DETECTED NOT DETECTED Final   Streptococcus pneumoniae NOT DETECTED NOT DETECTED Final   Streptococcus pyogenes NOT DETECTED NOT DETECTED Final   Acinetobacter baumannii NOT DETECTED NOT DETECTED Final   Enterobacteriaceae species NOT DETECTED NOT DETECTED Final   Enterobacter cloacae complex NOT DETECTED NOT DETECTED Final   Escherichia coli NOT DETECTED NOT DETECTED Final   Klebsiella oxytoca NOT DETECTED NOT DETECTED Final   Klebsiella pneumoniae NOT DETECTED NOT DETECTED Final   Proteus species NOT DETECTED NOT DETECTED Final   Serratia marcescens NOT DETECTED NOT DETECTED Final   Carbapenem resistance NOT DETECTED NOT DETECTED Final   Haemophilus influenzae DETECTED (A) NOT DETECTED Final    Comment: CRITICAL RESULT CALLED TO, READ BACK BY AND VERIFIED WITH: L CURRAN PHARMD 2113 11/02/15 A BROWNING    Neisseria meningitidis NOT DETECTED NOT DETECTED Final    Pseudomonas aeruginosa NOT DETECTED NOT DETECTED Final   Candida albicans NOT DETECTED NOT DETECTED Final   Candida glabrata NOT DETECTED NOT DETECTED Final   Candida krusei NOT DETECTED NOT DETECTED Final   Candida parapsilosis NOT DETECTED NOT DETECTED Final   Candida tropicalis NOT DETECTED NOT DETECTED Final  Blood culture (routine x 2)     Status: None (Preliminary result)   Collection Time: 11/01/15  6:48 PM  Result Value Ref Range Status   Specimen Description RIGHT ANTECUBITAL  Final   Special Requests BOTTLES DRAWN AEROBIC AND ANAEROBIC  Final   Culture NO GROWTH < 24 HOURS  Final   Report Status PENDING  Incomplete  Respiratory Panel by PCR     Status: None   Collection Time: 11/01/15 10:52 PM  Result Value Ref Range Status   Adenovirus NOT DETECTED NOT DETECTED Final   Coronavirus 229E NOT DETECTED NOT DETECTED Final   Coronavirus HKU1 NOT DETECTED NOT DETECTED Final   Coronavirus NL63 NOT DETECTED NOT DETECTED Final   Coronavirus OC43 NOT DETECTED NOT DETECTED Final   Metapneumovirus NOT DETECTED NOT DETECTED Final   Rhinovirus / Enterovirus NOT DETECTED NOT DETECTED Final   Influenza A NOT DETECTED NOT DETECTED Final   Influenza A H1 NOT DETECTED NOT DETECTED Final   Influenza A H1 2009 NOT DETECTED NOT DETECTED Final   Influenza A H3 NOT DETECTED NOT DETECTED Final   Influenza B NOT DETECTED NOT DETECTED Final   Parainfluenza Virus 1 NOT DETECTED NOT DETECTED Final   Parainfluenza Virus 2 NOT DETECTED NOT DETECTED Final   Parainfluenza Virus 3 NOT DETECTED NOT DETECTED Final   Parainfluenza Virus 4 NOT DETECTED NOT DETECTED Final   Respiratory Syncytial Virus NOT DETECTED NOT DETECTED Final   Bordetella pertussis NOT DETECTED NOT DETECTED Final   Chlamydophila pneumoniae NOT DETECTED NOT DETECTED Final   Mycoplasma pneumoniae NOT DETECTED NOT DETECTED Final  MRSA PCR Screening     Status: None   Collection Time: 11/02/15 12:17 AM  Result Value Ref Range Status     MRSA by PCR NEGATIVE NEGATIVE Final    Comment:        The GeneXpert MRSA Assay (FDA approved for NASAL specimens only), is one component of a comprehensive MRSA colonization surveillance program. It is not intended to diagnose MRSA infection nor to guide or monitor treatment for MRSA  infections.   Culture, expectorated sputum-assessment     Status: None   Collection Time: 11/02/15  5:06 AM  Result Value Ref Range Status   Specimen Description SPUTUM  Final   Special Requests NONE  Final   Sputum evaluation   Final    THIS SPECIMEN IS ACCEPTABLE. RESPIRATORY CULTURE REPORT TO FOLLOW.   Report Status 11/02/2015 FINAL  Final  Culture, respiratory (NON-Expectorated)     Status: None (Preliminary result)   Collection Time: 11/02/15  5:06 AM  Result Value Ref Range Status   Specimen Description SPUTUM  Final   Special Requests NONE  Final   Gram Stain   Final    ABUNDANT WBC PRESENT,BOTH PMN AND MONONUCLEAR MODERATE GRAM POSITIVE COCCI IN PAIRS FEW GRAM VARIABLE ROD    Culture PENDING  Incomplete   Report Status PENDING  Incomplete         Radiology Studies: Dg Chest 2 View  11/02/2015  CLINICAL DATA:  Shortness of breath. EXAM: CHEST  2 VIEW COMPARISON:  11/01/2015. 10/06/2015 05/20/2013. 05/07/2013. 09/01/2011. FINDINGS: Prior CABG. Cardiomegaly with normal pulmonary vascularity. Mild interstitial prominence noted suggesting mild interstitial edema. Right base pleural parenchymal thickening again noted consistent scarring. Underlying COPD cannot be excluded. Stable left apical pulmonary nodule again noted. IMPRESSION: 1. Prior CABG. Cardiomegaly with mild interstitial prominence noted bilaterally consistent with mild interstitial edema. 2. Right base pleural-parenchymal scarring again noted. COPD. Stable left apical pulmonary nodule unchanged from multiple prior exams dating to 09/01/2011. Electronically Signed   By: Maisie Fushomas  Register   On: 11/02/2015 07:50   Dg Chest Port 1  View  11/01/2015  CLINICAL DATA:  Acute onset of shortness of breath and productive cough. Respiratory distress. Initial encounter. EXAM: PORTABLE CHEST 1 VIEW COMPARISON:  Chest radiograph performed 10/08/2015 FINDINGS: A small right pleural effusion is noted. Vascular congestion is seen. Increased interstitial markings raise concern for mild interstitial edema. No no pneumothorax is seen. The cardiomediastinal silhouette is borderline normal in size. The patient is status post median sternotomy, with evidence of prior CABG. No acute osseous abnormalities are seen. IMPRESSION: Small right pleural effusion noted. Vascular congestion seen. Increased interstitial markings raise concern for mild interstitial edema. Electronically Signed   By: Roanna RaiderJeffery  Chang M.D.   On: 11/01/2015 19:22        Scheduled Meds: . amLODipine  10 mg Oral Daily  . aspirin EC  81 mg Oral Daily  . guaiFENesin  600 mg Oral BID  . ipratropium-albuterol  3 mL Nebulization TID  . methylPREDNISolone (SOLU-MEDROL) injection  60 mg Intravenous Q12H  . mometasone-formoterol  2 puff Inhalation BID  . sertraline  50 mg Oral Daily  . simvastatin  20 mg Oral Daily  . sodium chloride flush  3 mL Intravenous Q12H  . Warfarin - Pharmacist Dosing Inpatient   Does not apply q1800   Continuous Infusions:    LOS: 2 days       Jamiria Langill Annett Gulaaniel Ammon Muscatello, MD [facility] Pager [phone]  If 7PM-7AM, please contact night-coverage [email or web address] Password [email or web address] 11/03/2015, 8:58 AM

## 2015-11-04 DIAGNOSIS — J14 Pneumonia due to Hemophilus influenzae: Secondary | ICD-10-CM

## 2015-11-04 LAB — BASIC METABOLIC PANEL WITH GFR
Anion gap: 12 (ref 5–15)
BUN: 40 mg/dL — ABNORMAL HIGH (ref 6–20)
CO2: 27 mmol/L (ref 22–32)
Calcium: 8.5 mg/dL — ABNORMAL LOW (ref 8.9–10.3)
Chloride: 97 mmol/L — ABNORMAL LOW (ref 101–111)
Creatinine, Ser: 0.93 mg/dL (ref 0.61–1.24)
GFR calc Af Amer: >60 mL/min
GFR calc non Af Amer: >60 mL/min
Glucose, Bld: 114 mg/dL — ABNORMAL HIGH (ref 65–99)
Potassium: 3.3 mmol/L — ABNORMAL LOW (ref 3.5–5.1)
Sodium: 136 mmol/L (ref 135–145)

## 2015-11-04 LAB — CBC
HCT: 41.7 % (ref 39.0–52.0)
Hemoglobin: 12.7 g/dL — ABNORMAL LOW (ref 13.0–17.0)
MCH: 24.1 pg — ABNORMAL LOW (ref 26.0–34.0)
MCHC: 30.5 g/dL (ref 30.0–36.0)
MCV: 79.1 fL (ref 78.0–100.0)
Platelets: 246 10*3/uL (ref 150–400)
RBC: 5.27 MIL/uL (ref 4.22–5.81)
RDW: 17.8 % — ABNORMAL HIGH (ref 11.5–15.5)
WBC: 15 10*3/uL — ABNORMAL HIGH (ref 4.0–10.5)

## 2015-11-04 LAB — CULTURE, RESPIRATORY W GRAM STAIN

## 2015-11-04 LAB — CULTURE, BLOOD (ROUTINE X 2)

## 2015-11-04 LAB — CULTURE, RESPIRATORY: CULTURE: NORMAL

## 2015-11-04 LAB — PROTIME-INR
INR: 2.22 — ABNORMAL HIGH (ref 0.00–1.49)
Prothrombin Time: 24.4 seconds — ABNORMAL HIGH (ref 11.6–15.2)

## 2015-11-04 MED ORDER — METHYLPREDNISOLONE SODIUM SUCC 125 MG IJ SOLR
INTRAMUSCULAR | Status: AC
  Administered 2015-11-04: 60 mg
  Filled 2015-11-04: qty 2

## 2015-11-04 MED ORDER — WARFARIN SODIUM 2.5 MG PO TABS
2.5000 mg | ORAL_TABLET | Freq: Once | ORAL | Status: AC
  Administered 2015-11-04: 2.5 mg via ORAL
  Filled 2015-11-04: qty 1

## 2015-11-04 NOTE — Progress Notes (Signed)
ANTICOAGULATION CONSULT NOTE  Pharmacy Consult for Coumadin Indication: atrial fibrillation  Allergies  Allergen Reactions  . Prednisone Other (See Comments)    Makes pt jittery and nervous    Patient Measurements: Height: 6' (182.9 cm) Weight: 209 lb (94.802 kg) IBW/kg (Calculated) : 77.6  Vital Signs: Temp: 97.7 F (36.5 C) (06/14 0537) Temp Source: Oral (06/14 0537) BP: 125/60 mmHg (06/14 0537) Pulse Rate: 81 (06/14 0537)  Labs:  Recent Labs  11/01/15 2302 11/02/15 0453 11/02/15 1030 11/03/15 0258 11/04/15 0533  HGB  --  12.5*  --  11.3* 12.7*  HCT  --  41.4  --  37.5* 41.7  PLT  --  170  --  188 246  LABPROT  --  30.4*  --  38.0* 24.4*  INR  --  2.98*  --  4.00* 2.22*  CREATININE  --  1.81*  --  1.36* 0.93  TROPONINI 0.08* 0.05* 0.19*  --   --     Estimated Creatinine Clearance: 77 mL/min (by C-G formula based on Cr of 0.93).  Assessment: 82 y.o. male admitted with SOB/COPD exacerbation, h/o Afib- on warfarin PTA. Was seen in anticoagulation clinic on 6/9- pt was slightly confused and distraught (wife recently passed away) and was certain he was taking warfarin 5mg  on MWF and nothing on any other days. His INR that day was 3.4- was instructed to take 1 tablet (2.5mg ) daily and would go for recheck in 2 weeks.  INR back into therapeutic range this AM at 2.22  Goal of Therapy:  INR 2-3 Monitor platelets by anticoagulation protocol: Yes   Plan:  Coumadin 2.5 mg po x 1 today Daily INR  Thank you Okey RegalLisa Heliodoro Domagalski, PharmD [phone] 11/04/2015 10:54 AM

## 2015-11-04 NOTE — Care Management Important Message (Signed)
Important Message  Patient Details  Name: Roberto ChristmasJames Dobias MRN: [id number] Date of Birth: 07/19/1933   Medicare Important Message Given:  Yes    Dianca Owensby Abena 11/04/2015, 1:23 PM

## 2015-11-04 NOTE — Progress Notes (Addendum)
PROGRESS NOTE    Roberto Reilly  ZOX:[id number] DOB: 02/19/1934 DOA: 11/01/2015 PCP: Pearson GrippeJames Kim, MD (Confirm with patient/family/NH records and if not entered, this HAS to be entered at [facility] point of entry. "No PCP" if truly none.)   Brief Narrative: (Start on day 1 of progress note - keep it brief and live) COPD exacerbation, 82 yo male with cad, atrial fib, htn. Responding well to medical therapy. Blood culture positive for hemophilus    Assessment & Plan:   Active Problems:   CAD in native artery - LM, RCA, RI & Cx disease --> CABG x 4 (LIMA-LAD, SVG-OM/RI, SVG-rPDA-rPL)   Paroxysmal atrial fibrillation (HCC)   Dyslipidemia, goal LDL below 70   Essential hypertension   COPD mixed type (HCC)   Pleural effusion   Elevated troponin   Acute renal failure (ARF) (HCC)   Sepsis (HCC)   Pneumonia   COPD exacerbation (HCC)   Acute on chronic respiratory failure (HCC)   Abnormal TSH   1. Cardiovascular. Atrial fibrillation rate controlled, will continue blood pressure control with amlodipine. INR at 2.2. Follow blood cultures no growth, noted worsening leukocytosis, possible steroid induced to rule out worsening infection, will hold on steroids and follow cell count in am. Will continue monotherapy with ceftriaxone. H beta lactamase negative. Echocardiogram with no signs of diastolic dysfunction.   2. Pulmonary. Will continue to monitor oxymetry, supplemental 02 per [place], follow chest film no significant change, noted persistent right lower lobe opacity with pleural thickening personally reviewed will continue antibiotic therapy for presumptive pneumonia.   3, Nephrology. Stable K at 3.3 with cr at 0.93 and Na at 136 will continue to follow renal panel in am.   4. Neurology. Will continue sertraline.   Patient at moderate risk for worsening copd exacerbation. Will transfer out of step down unit   DVT prophylaxis: (Lovenox/Heparin/SCD's/anticoagulated/None (if comfort care) Code Status:  (Full/Partial - specify details) Family Communication: (Specify name, relationship & date discussed. NO "discussed with patient") Disposition Plan: (specify when and where you expect patient to be discharged). Include barriers to DC in this tab.   Consultants:  Procedures: (Don't include imaging studies which can be auto populated. Include things that cannot be auto populated i.e. Echo, Carotid and venous dopplers, Foley, Bipap, HD, tubes/drains, wound vac, central lines etc)    Antimicrobials: (specify start and planned stop date. Auto populated tables are space occupying and do not give end dates)  cefrtiaxone    Subjective: Continue to improve dyspnea, associated with dry cough, no chest pain, no nausea or vomiting, out of bed to chair.  Objective: Filed Vitals:   11/03/15 2008 11/04/15 0531 11/04/15 0537 11/04/15 1322  BP: 152/65  125/60 142/59  Pulse: 83  81 86  Temp: 97.6 F (36.4 C)  97.7 F (36.5 C) 98.1 F (36.7 C)  TempSrc:   Oral Oral  Resp: 20  16 18   Height: 6' (1.829 m)     Weight:  94.802 kg (209 lb)    SpO2: 94%  95% 96%    Intake/Output Summary (Last 24 hours) at 11/04/15 1513 Last data filed at 11/04/15 1321  Gross per 24 hour  Intake    890 ml  Output    625 ml  Net    265 ml   Filed Weights   11/01/15 1832 11/01/15 2300 11/04/15 0531  Weight: 96.163 kg (212 lb) 91.4 kg (201 lb 8 oz) 94.802 kg (209 lb)    Examination:  General exam: not in  pain, mild dyspnea E ENT: mild conjunctival pallor, oral mucosa moist. Respiratory system: Respiratory effort normal.Positive expiratory wheezing bilaterally, with scattered rhonchi and rales, prolonged expiratory phase. Cardiovascular system: S1 & S2 heard, RRR. No JVD, murmurs, rubs, gallops or clicks. No pedal edema. Gastrointestinal system: Abdomen is nondistended, soft and nontender. No organomegaly or masses felt. Normal bowel sounds heard. Central nervous system: Alert and oriented. No focal  neurological deficits. Extremities: Symmetric 5 x 5 power. Skin: No rashes, lesions or ulcers Psychiatry: Judgement and insight appear normal. Mood & affect appropriate.     Data Reviewed: I have personally reviewed following labs and imaging studies  CBC:  Recent Labs Lab 11/01/15 1846 11/02/15 0453 11/03/15 0258 11/04/15 0533  WBC 10.5 11.6* 11.0* 15.0*  NEUTROABS 8.4*  --   --   --   HGB 13.1 12.5* 11.3* 12.7*  HCT 42.6 41.4 37.5* 41.7  MCV 80.7 81.3 79.8 79.1  PLT 188 170 188 246   Basic Metabolic Panel:  Recent Labs Lab 11/01/15 1846 11/02/15 0453 11/02/15 2017 11/03/15 0258 11/04/15 0533  NA 135 137  --  138 136  K 4.6 5.7* 4.1 4.4 3.3*  CL 101 101  --  97* 97*  CO2 24 28  --  30 27  GLUCOSE 172* 141*  --  123* 114*  BUN 26* 33*  --  46* 40*  CREATININE 1.73* 1.81*  --  1.36* 0.93  CALCIUM 9.0 9.0  --  8.2* 8.5*  MG  --  2.3  --   --   --   PHOS  --  4.7*  --   --   --    GFR: Estimated Creatinine Clearance: 77 mL/min (by C-G formula based on Cr of 0.93). Liver Function Tests:  Recent Labs Lab 11/01/15 1846 11/02/15 0453  AST 20 16  ALT 15* 13*  ALKPHOS 69 63  BILITOT 1.2 0.8  PROT 7.3 6.6  ALBUMIN 3.4* 3.0*   No results for input(s): LIPASE, AMYLASE in the last 168 hours. No results for input(s): AMMONIA in the last 168 hours. Coagulation Profile:  Recent Labs Lab 10/30/15 1202 11/02/15 0453 11/03/15 0258 11/04/15 0533  INR 3.4 2.98* 4.00* 2.22*   Cardiac Enzymes:  Recent Labs Lab 11/01/15 1846 11/01/15 2302 11/02/15 0453 11/02/15 1030  TROPONINI 0.09* 0.08* 0.05* 0.19*   BNP (last 3 results) No results for input(s): PROBNP in the last 8760 hours. HbA1C:  Recent Labs  11/02/15 0453  HGBA1C 5.6   CBG:  Recent Labs Lab 11/02/15 0021  GLUCAP 118*   Lipid Profile: No results for input(s): CHOL, HDL, LDLCALC, TRIG, CHOLHDL, LDLDIRECT in the last 72 hours. Thyroid Function Tests:  Recent Labs  11/02/15 0453  TSH  0.137*   Anemia Panel: No results for input(s): VITAMINB12, FOLATE, FERRITIN, TIBC, IRON, RETICCTPCT in the last 72 hours. Sepsis Labs:  Recent Labs Lab 11/01/15 1853 11/01/15 2200  LATICACIDVEN 1.78 1.24    Recent Results (from the past 240 hour(s))  Blood culture (routine x 2)     Status: Abnormal   Collection Time: 11/01/15  6:47 PM  Result Value Ref Range Status   Specimen Description BLOOD LEFT ANTECUBITAL  Final   Special Requests BOTTLES DRAWN AEROBIC AND ANAEROBIC  Final   Culture  Setup Time   Final    GRAM NEGATIVE COCCOBACILLI ANAEROBIC BOTTLE ONLY CRITICAL RESULT CALLED TO, READ BACK BY AND VERIFIED WITH: Lelon Huh [facility] 2113 11/02/15 A BROWNING    Culture HAEMOPHILUS  INFLUENZAE BETA LACTAMASE NEGATIVE  (A)  Final   Report Status 11/04/2015 FINAL  Final  Blood Culture ID Panel (Reflexed)     Status: Abnormal   Collection Time: 11/01/15  6:47 PM  Result Value Ref Range Status   Enterococcus species NOT DETECTED NOT DETECTED Final   Vancomycin resistance NOT DETECTED NOT DETECTED Final   Listeria monocytogenes NOT DETECTED NOT DETECTED Final   Staphylococcus species NOT DETECTED NOT DETECTED Final   Staphylococcus aureus NOT DETECTED NOT DETECTED Final   Methicillin resistance NOT DETECTED NOT DETECTED Final   Streptococcus species NOT DETECTED NOT DETECTED Final   Streptococcus agalactiae NOT DETECTED NOT DETECTED Final   Streptococcus pneumoniae NOT DETECTED NOT DETECTED Final   Streptococcus pyogenes NOT DETECTED NOT DETECTED Final   Acinetobacter baumannii NOT DETECTED NOT DETECTED Final   Enterobacteriaceae species NOT DETECTED NOT DETECTED Final   Enterobacter cloacae complex NOT DETECTED NOT DETECTED Final   Escherichia coli NOT DETECTED NOT DETECTED Final   Klebsiella oxytoca NOT DETECTED NOT DETECTED Final   Klebsiella pneumoniae NOT DETECTED NOT DETECTED Final   Proteus species NOT DETECTED NOT DETECTED Final   Serratia marcescens NOT DETECTED  NOT DETECTED Final   Carbapenem resistance NOT DETECTED NOT DETECTED Final   Haemophilus influenzae DETECTED (A) NOT DETECTED Final    Comment: CRITICAL RESULT CALLED TO, READ BACK BY AND VERIFIED WITH: L CURRAN PHARMD 2113 11/02/15 A BROWNING    Neisseria meningitidis NOT DETECTED NOT DETECTED Final   Pseudomonas aeruginosa NOT DETECTED NOT DETECTED Final   Candida albicans NOT DETECTED NOT DETECTED Final   Candida glabrata NOT DETECTED NOT DETECTED Final   Candida krusei NOT DETECTED NOT DETECTED Final   Candida parapsilosis NOT DETECTED NOT DETECTED Final   Candida tropicalis NOT DETECTED NOT DETECTED Final  Blood culture (routine x 2)     Status: None (Preliminary result)   Collection Time: 11/01/15  6:48 PM  Result Value Ref Range Status   Specimen Description BLOOD RIGHT ANTECUBITAL  Final   Special Requests BOTTLES DRAWN AEROBIC AND ANAEROBIC  Final   Culture NO GROWTH 2 DAYS  Final   Report Status PENDING  Incomplete  Respiratory Panel by PCR     Status: None   Collection Time: 11/01/15 10:52 PM  Result Value Ref Range Status   Adenovirus NOT DETECTED NOT DETECTED Final   Coronavirus 229E NOT DETECTED NOT DETECTED Final   Coronavirus HKU1 NOT DETECTED NOT DETECTED Final   Coronavirus NL63 NOT DETECTED NOT DETECTED Final   Coronavirus OC43 NOT DETECTED NOT DETECTED Final   Metapneumovirus NOT DETECTED NOT DETECTED Final   Rhinovirus / Enterovirus NOT DETECTED NOT DETECTED Final   Influenza A NOT DETECTED NOT DETECTED Final   Influenza A H1 NOT DETECTED NOT DETECTED Final   Influenza A H1 2009 NOT DETECTED NOT DETECTED Final   Influenza A H3 NOT DETECTED NOT DETECTED Final   Influenza B NOT DETECTED NOT DETECTED Final   Parainfluenza Virus 1 NOT DETECTED NOT DETECTED Final   Parainfluenza Virus 2 NOT DETECTED NOT DETECTED Final   Parainfluenza Virus 3 NOT DETECTED NOT DETECTED Final   Parainfluenza Virus 4 NOT DETECTED NOT DETECTED Final   Respiratory Syncytial Virus  NOT DETECTED NOT DETECTED Final   Bordetella pertussis NOT DETECTED NOT DETECTED Final   Chlamydophila pneumoniae NOT DETECTED NOT DETECTED Final   Mycoplasma pneumoniae NOT DETECTED NOT DETECTED Final  MRSA PCR Screening     Status: None   Collection  Time: 11/02/15 12:17 AM  Result Value Ref Range Status   MRSA by PCR NEGATIVE NEGATIVE Final    Comment:        The GeneXpert MRSA Assay (FDA approved for NASAL specimens only), is one component of a comprehensive MRSA colonization surveillance program. It is not intended to diagnose MRSA infection nor to guide or monitor treatment for MRSA infections.   Culture, expectorated sputum-assessment     Status: None   Collection Time: 11/02/15  5:06 AM  Result Value Ref Range Status   Specimen Description SPUTUM  Final   Special Requests NONE  Final   Sputum evaluation   Final    THIS SPECIMEN IS ACCEPTABLE. RESPIRATORY CULTURE REPORT TO FOLLOW.   Report Status 11/02/2015 FINAL  Final  Culture, respiratory (NON-Expectorated)     Status: None   Collection Time: 11/02/15  5:06 AM  Result Value Ref Range Status   Specimen Description SPUTUM  Final   Special Requests NONE  Final   Gram Stain   Final    ABUNDANT WBC PRESENT,BOTH PMN AND MONONUCLEAR MODERATE GRAM POSITIVE COCCI IN PAIRS FEW GRAM VARIABLE ROD    Culture Consistent with normal respiratory flora.  Final   Report Status 11/04/2015 FINAL  Final         Radiology Studies: Dg Chest 2 View  11/03/2015  CLINICAL DATA:  Dyspnea. EXAM: CHEST  2 VIEW COMPARISON:  November 02, 2015. FINDINGS: Stable cardiomediastinal silhouette. Status post coronary artery bypass graft. No pneumothorax is noted. Left lung is clear. Stable scarring is noted in right lung base with pleural thickening. Bony thorax is unremarkable. IMPRESSION: Stable right basilar scarring and pleural thickening is noted. No significant changes noted compared to prior exam. Electronically Signed   By: Lupita Raider,  M.D.   On: 11/03/2015 12:56        Scheduled Meds: . amLODipine  10 mg Oral Daily  . aspirin EC  81 mg Oral Daily  . cefTRIAXone (ROCEPHIN)  IV  2 g Intravenous Q24H  . guaiFENesin  600 mg Oral BID  . ipratropium-albuterol  3 mL Nebulization TID  . mometasone-formoterol  2 puff Inhalation BID  . sertraline  50 mg Oral Daily  . simvastatin  20 mg Oral Daily  . sodium chloride flush  3 mL Intravenous Q12H  . warfarin  2.5 mg Oral ONCE-1800  . Warfarin - Pharmacist Dosing Inpatient   Does not apply q1800   Continuous Infusions:    LOS: 3 days        Takoda Siedlecki Annett Gula, MD [facility] Pager [phone]  If 7PM-7AM, please contact night-coverage [email or web address] Password [facility] 11/04/2015, 3:13 PM

## 2015-11-05 DIAGNOSIS — J9621 Acute and chronic respiratory failure with hypoxia: Secondary | ICD-10-CM

## 2015-11-05 LAB — BASIC METABOLIC PANEL
ANION GAP: 10 (ref 5–15)
BUN: 30 mg/dL — ABNORMAL HIGH (ref 6–20)
CALCIUM: 8.2 mg/dL — AB (ref 8.9–10.3)
CO2: 30 mmol/L (ref 22–32)
Chloride: 98 mmol/L — ABNORMAL LOW (ref 101–111)
Creatinine, Ser: 0.87 mg/dL (ref 0.61–1.24)
GFR calc Af Amer: >60 mL/min (ref >60)
GLUCOSE: 100 mg/dL — AB (ref 65–99)
POTASSIUM: 3.7 mmol/L (ref 3.5–5.1)
SODIUM: 138 mmol/L (ref 135–145)

## 2015-11-05 LAB — CBC
HCT: 40.1 % (ref 39.0–52.0)
Hemoglobin: 12.1 g/dL — ABNORMAL LOW (ref 13.0–17.0)
MCH: 23.9 pg — ABNORMAL LOW (ref 26.0–34.0)
MCHC: 30.2 g/dL (ref 30.0–36.0)
MCV: 79.1 fL (ref 78.0–100.0)
PLATELETS: 239 10*3/uL (ref 150–400)
RBC: 5.07 MIL/uL (ref 4.22–5.81)
RDW: 18.3 % — AB (ref 11.5–15.5)
WBC: 13 10*3/uL — AB (ref 4.0–10.5)

## 2015-11-05 LAB — PROTIME-INR
INR: 1.97 — AB (ref 0.00–1.49)
PROTHROMBIN TIME: 22.3 s — AB (ref 11.6–15.2)

## 2015-11-05 MED ORDER — AMOXICILLIN-POT CLAVULANATE 500-125 MG PO TABS
1.0000 | ORAL_TABLET | Freq: Two times a day (BID) | ORAL | Status: DC
  Filled 2015-11-05 (×3): qty 1

## 2015-11-05 MED ORDER — AMOXICILLIN-POT CLAVULANATE 500-125 MG PO TABS: 1.0000 | ORAL_TABLET | Freq: Two times a day (BID) | ORAL | Status: DC

## 2015-11-05 NOTE — Progress Notes (Signed)
NURSING PROGRESS NOTE  Roberto Reilly [id number] Discharge Data: 11/05/2015 5:56 PM Attending Provider: No att. providers found NFA:[id number]:Roberto Selena BattenKim, MD     Roberto ChristmasJames Beale to be D/C'd Home per MD order.  Discussed with the patient the After Visit Summary and all questions fully answered. All IV's discontinued with no bleeding noted. All belongings returned to patient for patient to take home.   Last Vital Signs:  Blood pressure 100/76, pulse 96, temperature 98 F (36.7 C), temperature source Oral, resp. rate 18, height 6' (1.829 m), weight 95.2 kg (209 lb 14.1 oz), SpO2 93 %.  Discharge Medication List   Medication List    STOP taking these medications        docusate sodium 100 MG capsule  Commonly known as:  COLACE     oxyCODONE 5 MG immediate release tablet  Commonly known as:  ROXICODONE     senna 8.6 MG Tabs tablet  Commonly known as:  SENOKOT      TAKE these medications        amLODipine 10 MG tablet  Commonly known as:  NORVASC  Take 1 tablet (10 mg total) by mouth daily.     amoxicillin-clavulanate 500-125 MG tablet  Commonly known as:  AUGMENTIN  Take 1 tablet (500 mg total) by mouth 2 (two) times daily.     aspirin EC 81 MG tablet  Take 81 mg by mouth daily.     budesonide-formoterol 160-4.5 MCG/ACT inhaler  Commonly known as:  SYMBICORT  Inhale 2 puffs into the lungs 2 (two) times daily.     cholecalciferol 1000 units tablet  Commonly known as:  VITAMIN D  Take 1,000 Units by mouth daily.     DUONEB 0.5-2.5 (3) MG/3ML Soln  Generic drug:  ipratropium-albuterol  Take 3 mLs by nebulization 4 (four) times daily.     furosemide 40 MG tablet  Commonly known as:  LASIX  TAKE ONE TABLET BY MOUTH EVERY MORNING     KLOR-CON M20 20 MEQ tablet  Generic drug:  potassium chloride SA  TAKE 1 TABLET (20 MEQ TOTAL) BY MOUTH DAILY.     OXYGEN  Inhale 2 L into the lungs as needed (shortness of breath).     sertraline 50 MG tablet  Commonly known as:  ZOLOFT  Take 50  mg by mouth daily.     simvastatin 40 MG tablet  Commonly known as:  ZOCOR  Take 20 mg by mouth daily.     warfarin 2.5 MG tablet  Commonly known as:  COUMADIN  TAKE 1-2 TABLETS BY MOUTH DAILY AS DIRECTED BY COUMADIN CLINIC

## 2015-11-05 NOTE — Discharge Instructions (Addendum)

## 2015-11-05 NOTE — Discharge Summary (Signed)
Roberto Reilly, is a 82 y.o. male  DOB [birth year]  MRN [id number].  Admission date:  11/01/2015  Admitting Physician  Therisa Doyne, MD  Discharge Date:  11/05/2015   Primary MD  Pearson Grippe, MD  Recommendations for primary care physician for things to follow:   Patient is been discharged home with home health and home physical therapy services. Patient was diagnosed with Haemophilus influenza pneumonia, patient will continue Augmentin for the next 7 days. Discharge INR is 1.97.   Admission Diagnosis  Elevated troponin [R79.89] Acute respiratory failure with hypoxia (HCC) [J96.01] RLL pneumonia [J18.9] Fever, unspecified fever cause [R50.9]   Discharge Diagnosis  Elevated troponin [R79.89] Acute respiratory failure with hypoxia (HCC) [J96.01] RLL pneumonia [J18.9] Fever, unspecified fever cause [R50.9]   Community-acquired Haemophilus influenza pneumonia with a right small non-complicated parapneumonic pleural effusion  Active Problems:   CAD in native artery - LM, RCA, RI & Cx disease --> CABG x 4 (LIMA-LAD, SVG-OM/RI, SVG-rPDA-rPL)   Paroxysmal atrial fibrillation (HCC)   Dyslipidemia, goal LDL below 70   Essential hypertension   COPD mixed type (HCC)   Pleural effusion   Elevated troponin   Acute renal failure (ARF) (HCC)   Sepsis (HCC)   Pneumonia   COPD exacerbation (HCC)   Acute on chronic respiratory failure (HCC)   Abnormal TSH      Past Medical History  Diagnosis Date  . History of non-ST Elevation MI (myocardial infarction) March 2007    Admitted with COPD exacerbation, given by CHF. Had some chest pain with positive troponins. Cath showed multivessel disease, referred for CABG  . CAD in native artery March 2007    Cath for dyspnea on exertion: 75% distal LM, 85% RI, 95% mid-distal Cx, in multiple RCA lesions with 95% distal. --> Referred for CABG; has declined further  noninvasive evaluation in the absence of worsening symptoms  . S/P CABG x 24 July 2005    LIMA-LAD, SVG-OM, seq SVG-PDA -PLB  . PAF (paroxysmal atrial fibrillation) (HCC)     Anticoagulated on warfarin. Stable -- post op  . H/O echocardiogram March ; January 2015    a) Normal LV size and cavity appeared normal function. Grade 1 diastolic dysfunction. Limited study.; b) normal LV size and function with EF 60-65%. Aortic sclerosis without stenosis, mildly increased PA pressures of but normal IVC and RA/RV size  . Obesity (BMI 30-39.9)     One year ago weighed 265 pounds, (12/2012) -- now 234 pounds  . Dyslipidemia, goal LDL below 70   . COPD (chronic obstructive pulmonary disease) with emphysema (HCC) 05/23/2007    Hosp 5/29-6/01/12- COPD exacerbation ONOX 12/08/10- desaturated to less than 88% for over an hour, qualifying for home O2 during sleep    . Hypoxia      chronic, on home O2  . History of pneumonia   . Seasonal allergies   . Diverticulosis of colon with hemorrhage 05/24/2013  . Hypertension   . Shortness of breath dyspnea   . Pneumonia  hx  . Myocardial infarction (HCC)   . Anxiety   . AAA (abdominal aortic aneurysm) (HCC)     4.2 X 4.8 cm 04/2013 CT; declined re-eval 02/25/15    Past Surgical History  Procedure Laterality Date  . Cataract extraction      Laser  . Rll resection for hamartoma      lungs  . Rul for hamartoma      lung  . Coronary artery bypass graft    . Cardiac catheterization  03 28 2007    NORMAL LV FUNCTION/ ABDOMINAL AORTA STENOSIS,75%-85%. RIGHT FEMORAL ARTERY :CATHETERS USED A  4-FRENCH WITH A 4-FRENCH SHEATH  . Colonoscopy N/A 05/24/2013    Procedure: COLONOSCOPY;  Surgeon: Iva Boop, MD;  Location: [facility];  Service: Endoscopy;  Laterality: N/A;  . Amputation Right 10/08/2015    Procedure: RIGHT FOOT TRANSMET AMPUTATION;  Surgeon: Toni Arthurs, MD;  Location: [facility] OR;  Service: Orthopedics;  Laterality: Right;  . Achilles tendon  lengthening Right 10/08/2015    Procedure: ACHILLES TENDON LENGTHENING;  Surgeon: Toni Arthurs, MD;  Location: [facility] OR;  Service: Orthopedics;  Laterality: Right;       HPI  from the history and physical done on the day of admission:   This is a 82 year old male who presented to the hospital due to shortness of breath. For last 3-4 days patient has presenting worsening and progressive dyspnea, associated with  productive cough with green sputum. Due to his worsening symptoms he called EMS who found him in respiratory distress and wheezing, his oxygen saturation was 82%. On the initial physical examination his blood pressure systolic was 148, his temperature was 100.8, pulse rate was 118, respirations rate was 35 breaths per minute and oxygen saturation was 94% on supplemental oxygen. He was in acute respiratory distress with increased work of breathing and using accessory respiratory muscles. His mucous membranes were moist, heart S1-S2 present tachycardic, lungs had extensive wheezing and rales diffusely bilaterally scattered rhonchi, his abdomen was soft and nontender, lower extremities had no significant edema. Neurologic the patient was intact. His serum sodium was 135, potassium 4.6, creatinine 1.73, his pH was 7.39 with a PCO2 40.3 and a PaO2 57.0, oxygen saturation 89%, his white cell count was 10.5 with a hemoglobin 13.1, his urinalysis was negative for infection, his respiratory viral panel was negative. His chest films showed a right lower lobe infiltrate with a small pleural effusion.   Patient was admitted to hospital with the working diagnosis of sepsis due to community-acquired pneumonia complicated by hypoxic respiratory failure acute on chronic.     Hospital Course:    1. Cardiovascular. Patient remained hemodynamic stable, his atrial fibrillation remained rate controlled, patient was continued on his anticoagulation with no major complications. Patient an echocardiogram showed normal LV  function with no signs of diastolic or systolic heart failure. Patient blood culture came back positive for Haemophilus influenza which was beta-lactamase negative. Patient responded well to IV antibiotic therapy with ceftriaxone, he'll be transitioned to oral Augmentin for next 7 days. Patient was continued on amlodipine for blood pressure control, note that patient is on aspirin and warfarin, may consider to discontinue antiplatelet therapy as an outpatient to prevent bleeding.   2. Pulmonary. Patient was treated with the bronchodilator therapy, supplemental oxygen, inhaled and systemic corticosteroids. The systemic corticosteroids were discontinued after a short course. Patient is feeling back to his baseline, he does have advanced chronic obstructive pulmonary disease and he is on home oxygen. Home health  will be arranged for posthospitalization follow-up.  3. Nephrology. Kidney function remained stable, electrolytes were followed closely here patient is able to tolerate by mouth diet adequately.  4. Neurology. No confusion or agitation. Patient was continued on his sertraline.  Discharge Condition: Stable  Follow UP Primary care physician    Consults obtained - non-  Diet and Activity recommendation: See Discharge Instructions below  Discharge Instructions     Discharge Instructions    Diet - low sodium heart healthy    Complete by:  As directed      Discharge instructions    Complete by:  As directed   Please continue antibiotic therapy as instructed.     Face-to-face encounter (required for Medicare/Medicaid patients)    Complete by:  As directed   I York RamMauricio Daniel Lataria Courser certify that this patient is under my care and that I, or a nurse practitioner or physician's assistant working with me, had a face-to-face encounter that meets the physician face-to-face encounter requirements with this patient on 11/05/2015. The encounter with the patient was in whole, or in part for the  following medical condition(s) which is the primary reason for home health care (List medical condition): post hospitalization follow up for respiratory failure.  The encounter with the patient was in whole, or in part, for the following medical condition, which is the primary reason for home health care:  hypoxic respiratory failure and pneumonia  I certify that, based on my findings, the following services are medically necessary home health services:   Nursing Physical therapy    Reason for Medically Necessary Home Health Services:  Skilled Nursing- Skilled Assessment/Observation  My clinical findings support the need for the above services:  Shortness of breath with activity  Further, I certify that my clinical findings support that this patient is homebound due to:  Shortness of Breath with activity     Home Health    Complete by:  As directed   To provide the following care/treatments:   PT Respiratory Care RN       Increase activity slowly    Complete by:  As directed              Discharge Medications       Medication List    STOP taking these medications        docusate sodium 100 MG capsule  Commonly known as:  COLACE     oxyCODONE 5 MG immediate release tablet  Commonly known as:  ROXICODONE     senna 8.6 MG Tabs tablet  Commonly known as:  SENOKOT      TAKE these medications        amLODipine 10 MG tablet  Commonly known as:  NORVASC  Take 1 tablet (10 mg total) by mouth daily.     amoxicillin-clavulanate 500-125 MG tablet  Commonly known as:  AUGMENTIN  Take 1 tablet (500 mg total) by mouth 2 (two) times daily.     aspirin EC 81 MG tablet  Take 81 mg by mouth daily.     budesonide-formoterol 160-4.5 MCG/ACT inhaler  Commonly known as:  SYMBICORT  Inhale 2 puffs into the lungs 2 (two) times daily.     cholecalciferol 1000 units tablet  Commonly known as:  VITAMIN D  Take 1,000 Units by mouth daily.     DUONEB 0.5-2.5 (3) MG/3ML Soln  Generic  drug:  ipratropium-albuterol  Take 3 mLs by nebulization 4 (four) times daily.     furosemide 40 MG  tablet  Commonly known as:  LASIX  TAKE ONE TABLET BY MOUTH EVERY MORNING     KLOR-CON M20 20 MEQ tablet  Generic drug:  potassium chloride SA  TAKE 1 TABLET (20 MEQ TOTAL) BY MOUTH DAILY.     OXYGEN  Inhale 2 L into the lungs as needed (shortness of breath).     sertraline 50 MG tablet  Commonly known as:  ZOLOFT  Take 50 mg by mouth daily.     simvastatin 40 MG tablet  Commonly known as:  ZOCOR  Take 20 mg by mouth daily.     warfarin 2.5 MG tablet  Commonly known as:  COUMADIN  TAKE 1-2 TABLETS BY MOUTH DAILY AS DIRECTED BY COUMADIN CLINIC        Major procedures and Radiology Reports - PLEASE review detailed and final reports for all details, in brief -     Dg Chest 2 View  11/03/2015  CLINICAL DATA:  Dyspnea. EXAM: CHEST  2 VIEW COMPARISON:  November 02, 2015. FINDINGS: Stable cardiomediastinal silhouette. Status post coronary artery bypass graft. No pneumothorax is noted. Left lung is clear. Stable scarring is noted in right lung base with pleural thickening. Bony thorax is unremarkable. IMPRESSION: Stable right basilar scarring and pleural thickening is noted. No significant changes noted compared to prior exam. Electronically Signed   By: Lupita Raider, M.D.   On: 11/03/2015 12:56   Dg Chest 2 View  11/02/2015  CLINICAL DATA:  Shortness of breath. EXAM: CHEST  2 VIEW COMPARISON:  11/01/2015. 10/06/2015 05/20/2013. 05/07/2013. 09/01/2011. FINDINGS: Prior CABG. Cardiomegaly with normal pulmonary vascularity. Mild interstitial prominence noted suggesting mild interstitial edema. Right base pleural parenchymal thickening again noted consistent scarring. Underlying COPD cannot be excluded. Stable left apical pulmonary nodule again noted. IMPRESSION: 1. Prior CABG. Cardiomegaly with mild interstitial prominence noted bilaterally consistent with mild interstitial edema. 2. Right  base pleural-parenchymal scarring again noted. COPD. Stable left apical pulmonary nodule unchanged from multiple prior exams dating to 09/01/2011. Electronically Signed   By: Maisie Fus  Register   On: 11/02/2015 07:50   Dg Chest 2 View  10/08/2015  CLINICAL DATA:  82 year old male with COPD. Preoperative chest x-ray. Current fever. EXAM: CHEST  2 VIEW COMPARISON:  Multiple prior chest x-ray, most recent 05/20/2013, 05/07/2013 FINDINGS: Cardiomediastinal silhouette unchanged in size and contour, with cardiomegaly. Tortuosity descending thoracic aorta. Double density of the lower mediastinum, new from the comparison. Surgical changes of prior median sternotomy and CABG. Stigmata of emphysema, with increased retrosternal airspace, flattened hemidiaphragms, increased AP diameter, and hyperinflation on the AP view. No pleural effusion or pneumothorax. Persistent opacity at the right lung base with blunting of the right costophrenic angle and partial obscuration the right hemidiaphragm, status post right lower lobe resection/lobectomy. No displaced fracture.  Accentuated kyphotic curvature. IMPRESSION: Chronic changes at the base of the right lung, similar to comparison, most likely postoperative with no new confluent airspace disease. Changes of emphysema with no pleural effusion. Surgical changes of prior median sternotomy and CABG. Signed, Yvone Neu. Loreta Ave, DO [facility] Radiology Electronically Signed   By: Gilmer Mor D.O.   On: 10/08/2015 10:11   Dg Chest Port 1 View  11/01/2015  CLINICAL DATA:  Acute onset of shortness of breath and productive cough. Respiratory distress. Initial encounter. EXAM: PORTABLE CHEST 1 VIEW COMPARISON:  Chest radiograph performed 10/08/2015 FINDINGS: A small right pleural effusion is noted. Vascular congestion is seen. Increased interstitial markings raise concern for mild  interstitial edema. No no pneumothorax is seen. The  cardiomediastinal silhouette is borderline normal in size. The patient is status post median sternotomy, with evidence of prior CABG. No acute osseous abnormalities are seen. IMPRESSION: Small right pleural effusion noted. Vascular congestion seen. Increased interstitial markings raise concern for mild interstitial edema. Electronically Signed   By: Roanna Raider M.D.   On: 11/01/2015 19:22    Micro Results     Recent Results (from the past 240 hour(s))  Blood culture (routine x 2)     Status: Abnormal   Collection Time: 11/01/15  6:47 PM  Result Value Ref Range Status   Specimen Description BLOOD LEFT ANTECUBITAL  Final   Special Requests BOTTLES DRAWN AEROBIC AND ANAEROBIC  Final   Culture  Setup Time   Final    GRAM NEGATIVE COCCOBACILLI ANAEROBIC BOTTLE ONLY CRITICAL RESULT CALLED TO, READ BACK BY AND VERIFIED WITH: L CURRAN PHARMD 2113 11/02/15 A BROWNING    Culture HAEMOPHILUS INFLUENZAE BETA LACTAMASE NEGATIVE  (A)  Final   Report Status 11/04/2015 FINAL  Final  Blood Culture ID Panel (Reflexed)     Status: Abnormal   Collection Time: 11/01/15  6:47 PM  Result Value Ref Range Status   Enterococcus species NOT DETECTED NOT DETECTED Final   Vancomycin resistance NOT DETECTED NOT DETECTED Final   Listeria monocytogenes NOT DETECTED NOT DETECTED Final   Staphylococcus species NOT DETECTED NOT DETECTED Final   Staphylococcus aureus NOT DETECTED NOT DETECTED Final   Methicillin resistance NOT DETECTED NOT DETECTED Final   Streptococcus species NOT DETECTED NOT DETECTED Final   Streptococcus agalactiae NOT DETECTED NOT DETECTED Final   Streptococcus pneumoniae NOT DETECTED NOT DETECTED Final   Streptococcus pyogenes NOT DETECTED NOT DETECTED Final   Acinetobacter baumannii NOT DETECTED NOT DETECTED Final   Enterobacteriaceae species NOT DETECTED NOT DETECTED Final   Enterobacter cloacae complex NOT DETECTED NOT DETECTED Final   Escherichia coli NOT DETECTED NOT DETECTED  Final   Klebsiella oxytoca NOT DETECTED NOT DETECTED Final   Klebsiella pneumoniae NOT DETECTED NOT DETECTED Final   Proteus species NOT DETECTED NOT DETECTED Final   Serratia marcescens NOT DETECTED NOT DETECTED Final   Carbapenem resistance NOT DETECTED NOT DETECTED Final   Haemophilus influenzae DETECTED (A) NOT DETECTED Final    Comment: CRITICAL RESULT CALLED TO, READ BACK BY AND VERIFIED WITH: L CURRAN PHARMD 2113 11/02/15 A BROWNING    Neisseria meningitidis NOT DETECTED NOT DETECTED Final   Pseudomonas aeruginosa NOT DETECTED NOT DETECTED Final   Candida albicans NOT DETECTED NOT DETECTED Final   Candida glabrata NOT DETECTED NOT DETECTED Final   Candida krusei NOT DETECTED NOT DETECTED Final   Candida parapsilosis NOT DETECTED NOT DETECTED Final   Candida tropicalis NOT DETECTED NOT DETECTED Final  Blood culture (routine x 2)     Status: None (Preliminary result)   Collection Time: 11/01/15  6:48 PM  Result Value Ref Range Status   Specimen Description BLOOD RIGHT ANTECUBITAL  Final   Special Requests BOTTLES DRAWN AEROBIC AND ANAEROBIC  Final   Culture NO GROWTH 3 DAYS  Final   Report Status PENDING  Incomplete  Respiratory Panel by PCR     Status: None   Collection Time: 11/01/15 10:52 PM  Result Value Ref Range Status   Adenovirus NOT DETECTED NOT DETECTED Final   Coronavirus 229E NOT DETECTED NOT DETECTED Final   Coronavirus HKU1 NOT DETECTED NOT DETECTED Final   Coronavirus NL63 NOT DETECTED NOT  DETECTED Final   Coronavirus OC43 NOT DETECTED NOT DETECTED Final   Metapneumovirus NOT DETECTED NOT DETECTED Final   Rhinovirus / Enterovirus NOT DETECTED NOT DETECTED Final   Influenza A NOT DETECTED NOT DETECTED Final   Influenza A H1 NOT DETECTED NOT DETECTED Final   Influenza A H1 2009 NOT DETECTED NOT DETECTED Final   Influenza A H3 NOT DETECTED NOT DETECTED Final   Influenza B NOT DETECTED NOT DETECTED Final   Parainfluenza Virus 1 NOT DETECTED NOT DETECTED Final    Parainfluenza Virus 2 NOT DETECTED NOT DETECTED Final   Parainfluenza Virus 3 NOT DETECTED NOT DETECTED Final   Parainfluenza Virus 4 NOT DETECTED NOT DETECTED Final   Respiratory Syncytial Virus NOT DETECTED NOT DETECTED Final   Bordetella pertussis NOT DETECTED NOT DETECTED Final   Chlamydophila pneumoniae NOT DETECTED NOT DETECTED Final   Mycoplasma pneumoniae NOT DETECTED NOT DETECTED Final  MRSA PCR Screening     Status: None   Collection Time: 11/02/15 12:17 AM  Result Value Ref Range Status   MRSA by PCR NEGATIVE NEGATIVE Final    Comment:        The GeneXpert MRSA Assay (FDA approved for NASAL specimens only), is one component of a comprehensive MRSA colonization surveillance program. It is not intended to diagnose MRSA infection nor to guide or monitor treatment for MRSA infections.   Culture, expectorated sputum-assessment     Status: None   Collection Time: 11/02/15  5:06 AM  Result Value Ref Range Status   Specimen Description SPUTUM  Final   Special Requests NONE  Final   Sputum evaluation   Final    THIS SPECIMEN IS ACCEPTABLE. RESPIRATORY CULTURE REPORT TO FOLLOW.   Report Status 11/02/2015 FINAL  Final  Culture, respiratory (NON-Expectorated)     Status: None   Collection Time: 11/02/15  5:06 AM  Result Value Ref Range Status   Specimen Description SPUTUM  Final   Special Requests NONE  Final   Gram Stain   Final    ABUNDANT WBC PRESENT,BOTH PMN AND MONONUCLEAR MODERATE GRAM POSITIVE COCCI IN PAIRS FEW GRAM VARIABLE ROD    Culture Consistent with normal respiratory flora.  Final   Report Status 11/04/2015 FINAL  Final  Culture, blood (routine x 2)     Status: None (Preliminary result)   Collection Time: 11/03/15 10:57 AM  Result Value Ref Range Status   Specimen Description BLOOD RIGHT HAND  Final   Special Requests BOTTLES DRAWN AEROBIC ONLY 5CC  Final   Culture NO GROWTH 1 DAY  Final   Report Status PENDING  Incomplete  Culture, blood (routine x  2)     Status: None (Preliminary result)   Collection Time: 11/03/15 11:02 AM  Result Value Ref Range Status   Specimen Description BLOOD LEFT ANTECUBITAL  Final   Special Requests BOTTLES DRAWN AEROBIC ONLY 5CC  Final   Culture NO GROWTH 1 DAY  Final   Report Status PENDING  Incomplete       Today   Subjective    Roberto Reilly patient is feeling much better, his dyspnea is back to his baseline. He is out of bed. No nausea or vomiting, tolerating by mouth diet adequately. Objective   Blood pressure 145/70, pulse 63, temperature 97.9 F (36.6 C), temperature source Oral, resp. rate 18, height 6' (1.829 m), weight 95.2 kg (209 lb 14.1 oz), SpO2 97 %.   Intake/Output Summary (Last 24 hours) at 11/05/15 1149 Last data filed at 11/05/15  1110  Gross per 24 hour  Intake   1070 ml  Output   1200 ml  Net   -130 ml    Exam Gen. awake and alert Neck supple, no JVD or accessory muscles Chest. Lungs had a prolonged expiratory phase with scattered wheezing and rhonchi, Rales at the right base. Heart S1-S2 present irregularly irregular, no gallops, rubs or murmurs Abdomen soft nontender Lower Extremities trace nonpitting edema Skin no significant rashes Neurologically nonfocal   Data Review   CBC w Diff: Lab Results  Component Value Date   WBC 13.0* 11/05/2015   HGB 12.1* 11/05/2015   HCT 40.1 11/05/2015   PLT 239 11/05/2015   LYMPHOPCT 3 11/01/2015   MONOPCT 16 11/01/2015   EOSPCT 0 11/01/2015   BASOPCT 0 11/01/2015    CMP: Lab Results  Component Value Date   NA 138 11/05/2015   K 3.7 11/05/2015   CL 98* 11/05/2015   CO2 30 11/05/2015   BUN 30* 11/05/2015   CREATININE 0.87 11/05/2015   PROT 6.6 11/02/2015   ALBUMIN 3.0* 11/02/2015   BILITOT 0.8 11/02/2015   ALKPHOS 63 11/02/2015   AST 16 11/02/2015   ALT 13* 11/02/2015  .   Total Time in preparing paper work, data evaluation and todays exam - 45 minutes  Coralie Keens M.D on 11/05/2015 at 11:49  AM  [facility]   Office  [phone]

## 2015-11-06 LAB — CULTURE, BLOOD (ROUTINE X 2): Culture: NO GROWTH

## 2015-11-08 LAB — CULTURE, BLOOD (ROUTINE X 2)
CULTURE: NO GROWTH
CULTURE: NO GROWTH

## 2015-11-09 ENCOUNTER — Ambulatory Visit (INDEPENDENT_AMBULATORY_CARE_PROVIDER_SITE_OTHER): Payer: Medicare Other | Admitting: Ophthalmology

## 2015-11-11 ENCOUNTER — Ambulatory Visit (INDEPENDENT_AMBULATORY_CARE_PROVIDER_SITE_OTHER): Payer: Medicare Other | Admitting: Pharmacist Clinician (PhC)/ Clinical Pharmacy Specialist

## 2015-11-11 DIAGNOSIS — Z4789 Encounter for other orthopedic aftercare: Secondary | ICD-10-CM | POA: Diagnosis not present

## 2015-11-11 DIAGNOSIS — Z7901 Long term (current) use of anticoagulants: Secondary | ICD-10-CM

## 2015-11-11 DIAGNOSIS — I4891 Unspecified atrial fibrillation: Secondary | ICD-10-CM | POA: Diagnosis not present

## 2015-11-11 LAB — POCT INR: INR: 1.8

## 2015-11-14 ENCOUNTER — Other Ambulatory Visit: Payer: Self-pay | Admitting: Cardiology

## 2015-11-26 ENCOUNTER — Encounter: Payer: Self-pay | Admitting: Internal Medicine

## 2015-11-26 ENCOUNTER — Ambulatory Visit (INDEPENDENT_AMBULATORY_CARE_PROVIDER_SITE_OTHER): Payer: Medicare Other | Admitting: Internal Medicine

## 2015-11-26 VITALS — BP 130/76 | HR 94 | Ht 72.0 in | Wt 203.8 lb

## 2015-11-26 DIAGNOSIS — J9611 Chronic respiratory failure with hypoxia: Secondary | ICD-10-CM | POA: Diagnosis not present

## 2015-11-26 DIAGNOSIS — I480 Paroxysmal atrial fibrillation: Secondary | ICD-10-CM | POA: Diagnosis not present

## 2015-11-26 DIAGNOSIS — J449 Chronic obstructive pulmonary disease, unspecified: Secondary | ICD-10-CM

## 2015-11-26 DIAGNOSIS — J441 Chronic obstructive pulmonary disease with (acute) exacerbation: Secondary | ICD-10-CM

## 2015-11-26 MED ORDER — AMOXICILLIN-POT CLAVULANATE 500-125 MG PO TABS: 1.0000 | ORAL_TABLET | Freq: Two times a day (BID) | ORAL | Status: DC

## 2015-11-26 NOTE — Assessment & Plan Note (Signed)
He feels he never completely resolved post pneumonia bronchitis. He asks extension of antibiotic discussed carefully. Plan-Augmentin, fluids, rest

## 2015-11-26 NOTE — Patient Instructions (Addendum)
Script sent for augmentin  Order- DME [facility]- Please evaluate for portable O2 concentrator 2l/min   Dx COPD mixed type, chronic respiratory failure with hypoxia  (Given 2 available samples Symbicort 80)  Please call as needed

## 2015-11-26 NOTE — Assessment & Plan Note (Signed)
Pulse feels like well-controlled atrial fibrillation. He doesn't notice palpitation. Problem is managed by cardiology.

## 2015-11-26 NOTE — Assessment & Plan Note (Signed)
Okay to have [facility] evaluate portable concentrator for diagnoses of COPD mixed type and chronic respiratory failure with hypoxia.

## 2015-11-26 NOTE — Progress Notes (Signed)
Patient ID: Roberto Reilly, male    DOB: 09/01/1933, 82 y.o.   MRN: [id number]  HPI 12/06/10- [age] yoM former smoker, followed for COPD, complicated by CAD, diastolic heart failure, PAFib/chronic anticoagulation, HBP, Obesity Last here January 11, 2010. - Note reviewed. He was hosp for COPD exacerbation, diastolic heart failure, chronic respiratory failure  5/29-10/22/10 , but says he is back to baseline now.  He blames hot humid weather currently for cough with chest rattle with clear to trace yellow sputum. He feels well controlled now. Denies smoking. Dr [name] added Symbicort ? Strength. It may be helping some. Using ventolin rescue inhaler 0-2x/day. Triggers- weather, indoors to outdoors. Continues Spiriva. He is wearing a heart monitor for Dr Clarene DukeLittle, potential need for pacemaker.   01/06/11-77 yoM former smoker, followed for COPD, complicated by CAD, diastolic heart failure, PAFib/chronic anticoagulation, HBP, Obesity Blames occasional bad day on "marital stress". Feels he can't leave wife long enough to have pacemaker placed.  Good and bad days with breathing are affected by the weather.  He had home O2 after hospital- did ONOX- desat < 88% for over an hour, meeting qualifying criteria for home O2 for sleep- discussed.  He again says he is not interested in having a sleep study and would not choose to treat sleep apnea if he found he had it.   09/01/11- 77 yoM former smoker, followed for COPD, complicated by CAD, diastolic heart failure, PAFib/chronic anticoagulation, HBP, Obesity, old right thoracotomy Wife fell and broke her right arm so he has to do the housework and help her which is very depressing and confining. Breathing has been stable except he has to use his nebulizer at night. Gets up repeatedly for wife and he is tired and worn out. Little cough with scant clear phlegm. Did not qualify for help with Spiriva cost. Our office is looking at that. He did qualify for Xopenex. Using Symbicort and  nebulizer with no new heart issues since last here.  03/08/12- 77 yoM former smoker, followed for COPD, complicated by CAD, diastolic heart failure, PAFib/chronic anticoagulation, HBP, Obesity, old right thoracotomy Increased SOB with activity; denies any wheezing, cough, or congestion. COPD Assessment Test (CAT) score 14/40 Can't afford Spiriva and doesn't qualify for assistance. Asks samples. Occasional cough with clear phlegm, no wheeze, chest pain or blood.  Uses O2 at night most nights. We discussed O2 therapy. CXR 09/01/11 IMPRESSION:  Again noted mild hyperinflation. Stable chronic blunting of the  right costophrenic angle and mild elevation of the right  hemidiaphragm. This may be due to pleural thickening, scarring or  small right pleural effusion. No acute infiltrate or pulmonary  edema. Question old right lower healed rib fractures.  Original Report Authenticated By: Natasha MeadLIVIU POP, M.D.   09/06/12- [age] yoM former smoker, followed for COPD, complicated by CAD, diastolic heart failure, PAFib/chronic anticoagulation, HBP, Obesity, old right thoracotomy FOLLOWS FOR: Breathing is unchanged. Reports DOE and a dry cough from time to time. Denies chest pain or chest tightness. He is still using a leftover Xopenex, but has prescription for albuterol. Hopefully that won't bother his atrial fibrillation.  03/12/13- 79 yoM former smoker, followed for COPD, complicated by CAD, diastolic heart failure, PAFib/chronic anticoagulation, HBP, Obesity, old right thoracotomy FOLLOWS FOR: has noticed he is needing his O2 more each day.  Wife is in and out of the hospital, requiring a lot of his energy and time. He blames low oxygen saturation on arrival today on coming across the parking lot and being  cold. Says he can't afford rescue inhaler. Cough is only productive after his nebulizer-white with no blood.  09/10/13- 79 yoM former smoker, followed for COPD, complicated by CAD, diastolic heart failure,  PAFib/chronic anticoagulation, HBP, Obesity, old right thoracotomy FOLLOWS FOR:sob with exertion,denies cough or wheeze,no cp or thightness,samples no difference since last ov,still using Symbicort Feels controlled. Reports no change. Preoccupied caring for wife. Stays in to avoid weather changes. Some sneezing. CXR 05/20/13 IMPRESSION:  1. Increased atelectasis in the right lower lobe medially.  2. Chronic blunting of the right costophrenic angle with remote  right rib fractures and osteotomies.  Electronically Signed  By: Herbie BaltimoreWalt Liebkemann M.D.  On: 05/20/2013 15:50  03/11/14- 80 yoM former smoker, followed for COPD, complicated by CAD, diastolic heart failure, PAFib/chronic anticoagulation, HBP, Obesity, old right thoracotomy FOLLOW FOR: COPD SOB with exertion, wheeze no cough, nor chest tightness.. Still using Symbicort   09/10/14- 80 yoM former smoker, followed for COPD, complicated by CAD, diastolic heart failure, PAFib/chronic anticoagulation, HBP, Obesity, old right thoracotomy FOLLOWS FOR: Pt c/o DOE, chest congestion, prod cough with green and yellow mucus x 3 days. Pt denies CP/tightness anf f/c/s.  Has caught a distinct cold over the last 3 days. Denies fever. Sputum was green and chest was much tighter 2 days ago. No blood or pain.  03/12/15- 80 yoM former smoker, followed for COPD, complicated by CAD, diastolic heart failure, PAFib/chronic anticoagulation, HBP, Obesity, old right thoracotomy FOLLOWS FOR:Sob with exertion-same,cough-"frosty white",wheezing,denies cp or tightness He describes his cough as routine pattern for him with no acute exacerbation or change. No blood or chest pain.  11/26/2015-82 year old male former smoker followed for COPD, complicated by CAD, diastolic heart failure, PAF/chronic anticoagulation, HBP, obesity, old right thoracotomy O2 2 L/Advanced FOLLOWS FOR: DME: [facility] for O2; wears with acitivity. Has had toruble with breathing lately with heat. Pt was  rencently in [facility] for PNA and prior to that he went in for ambutation of part of his right foot. Pt states he is not sleeping well either.  Saturation today at rest on room air 88%.  Bed year-wife died, he was hospitalized with H. influenzae pneumonia 6/11-6/15- Augmentin. Had toes amputated right foot nonhealing ulcer. Cough not productive white sputum but he feels pneumonia not completely resolved and asks for additional Augmentin. Denies fever/chills. No chest pain or blood. CXR 11/03/2015 IMPRESSION: Stable right basilar scarring and pleural thickening is noted. No significant changes noted compared to prior exam. Electronically Signed  By: Lupita RaiderJames Green Jr, M.D.  On: 11/03/2015 12:56  Review of Systems- see HPI Constitutional:   No-   weight loss, night sweats, fevers, chills, fatigue, lassitude. HEENT:   No-   headaches, difficulty swallowing, tooth/dental problems, sore throat,                  No-   sneezing, itching, ear ache,+ nasal congestion, post nasal drip,  CV:  No-   chest pain, orthopnea, PND, swelling in lower extremities, anasarca, dizziness, palpitations GI:  No-   heartburn, indigestion, abdominal pain, nausea, vomiting,  Resp: , per HPI, Chronic cough              No-  coughing up of blood.               change in color of mucus.  +occasional wheezing.   Skin: No-   rash or lesions. GU: . MS:  No-   joint pain or swelling.   Psych:  + change in mood or  affect. + depression or anxiety.  No memory loss.   Objective:   Physical Exam General- Alert, Oriented, Affect-appropriate, Distress- none acute   + Overweight., + Tripod posture, pursed lips  Skin- rash-none, lesions- none, excoriation- none. + coumadin bruising Lymphadenopathy- none Head- atraumatic            Eyes- Gross vision intact, PERRLA, conjunctivae clear secretions            Ears- Hearing, canals            Nose- Clear, No-Septal dev, mucus, polyps, erosion, perforation             Throat- Mallampati  II , mucosa clear , drainage- none, tonsils- atrophic, +dentures, +hoarse Neck- flexible , trachea midline, no stridor , thyroid nl, carotid no bruit Chest - symmetrical excursion , unlabored           Heart/CV-+ I RR( no pacer) , no murmur , no gallop  , no rub, nl s1 s2                           - JVD- none , edema- none, stasis changes- none, varices- none           Lung- +coarse breath sounds,  wheeze +end expiratory , no- cough, dullness-none, rub- none   unlabored,            Chest wall- sternal scar Abd-  Br/ Gen/ Rectal- Not done, not indicated Extrem- + external varices on legs, + boot right foot, + stasis changes Neuro- grossly intact to observation

## 2015-12-02 ENCOUNTER — Ambulatory Visit (INDEPENDENT_AMBULATORY_CARE_PROVIDER_SITE_OTHER): Payer: Medicare Other | Admitting: Pharmacist

## 2015-12-02 DIAGNOSIS — I4891 Unspecified atrial fibrillation: Secondary | ICD-10-CM | POA: Diagnosis not present

## 2015-12-02 DIAGNOSIS — Z7901 Long term (current) use of anticoagulants: Secondary | ICD-10-CM | POA: Diagnosis not present

## 2015-12-02 DIAGNOSIS — Z4789 Encounter for other orthopedic aftercare: Secondary | ICD-10-CM | POA: Diagnosis not present

## 2015-12-02 DIAGNOSIS — L97514 Non-pressure chronic ulcer of other part of right foot with necrosis of bone: Secondary | ICD-10-CM | POA: Diagnosis not present

## 2015-12-02 DIAGNOSIS — Z89431 Acquired absence of right foot: Secondary | ICD-10-CM | POA: Diagnosis not present

## 2015-12-02 LAB — POCT INR: INR: 2.4

## 2015-12-04 DIAGNOSIS — J9621 Acute and chronic respiratory failure with hypoxia: Secondary | ICD-10-CM | POA: Insufficient documentation

## 2015-12-28 ENCOUNTER — Ambulatory Visit (INDEPENDENT_AMBULATORY_CARE_PROVIDER_SITE_OTHER): Payer: Medicare Other | Admitting: Ophthalmology

## 2015-12-28 ENCOUNTER — Other Ambulatory Visit: Payer: Self-pay | Admitting: Cardiology

## 2015-12-28 DIAGNOSIS — H35371 Puckering of macula, right eye: Secondary | ICD-10-CM | POA: Diagnosis not present

## 2015-12-28 DIAGNOSIS — I10 Essential (primary) hypertension: Secondary | ICD-10-CM

## 2015-12-28 DIAGNOSIS — H353132 Nonexudative age-related macular degeneration, bilateral, intermediate dry stage: Secondary | ICD-10-CM | POA: Diagnosis not present

## 2015-12-28 DIAGNOSIS — H43811 Vitreous degeneration, right eye: Secondary | ICD-10-CM

## 2015-12-28 DIAGNOSIS — H348322 Tributary (branch) retinal vein occlusion, left eye, stable: Secondary | ICD-10-CM

## 2015-12-28 DIAGNOSIS — H35033 Hypertensive retinopathy, bilateral: Secondary | ICD-10-CM

## 2015-12-29 NOTE — Telephone Encounter (Signed)
Rx(s) sent to pharmacy electronically.  

## 2015-12-30 ENCOUNTER — Ambulatory Visit (INDEPENDENT_AMBULATORY_CARE_PROVIDER_SITE_OTHER): Payer: Medicare Other | Admitting: Pharmacist

## 2015-12-30 DIAGNOSIS — Z7901 Long term (current) use of anticoagulants: Secondary | ICD-10-CM

## 2015-12-30 DIAGNOSIS — I4891 Unspecified atrial fibrillation: Secondary | ICD-10-CM | POA: Diagnosis not present

## 2015-12-30 LAB — POCT INR: INR: 1.6

## 2016-02-05 ENCOUNTER — Encounter: Payer: Self-pay | Admitting: Cardiology

## 2016-02-05 ENCOUNTER — Ambulatory Visit (INDEPENDENT_AMBULATORY_CARE_PROVIDER_SITE_OTHER): Payer: Medicare Other | Admitting: Pharmacist Clinician (PhC)/ Clinical Pharmacy Specialist

## 2016-02-05 ENCOUNTER — Ambulatory Visit (INDEPENDENT_AMBULATORY_CARE_PROVIDER_SITE_OTHER): Payer: Medicare Other | Admitting: Cardiology

## 2016-02-05 VITALS — BP 134/62 | HR 82 | Ht 72.0 in | Wt 212.0 lb

## 2016-02-05 DIAGNOSIS — I10 Essential (primary) hypertension: Secondary | ICD-10-CM

## 2016-02-05 DIAGNOSIS — I714 Abdominal aortic aneurysm, without rupture, unspecified: Secondary | ICD-10-CM

## 2016-02-05 DIAGNOSIS — I4891 Unspecified atrial fibrillation: Secondary | ICD-10-CM

## 2016-02-05 DIAGNOSIS — I2510 Atherosclerotic heart disease of native coronary artery without angina pectoris: Secondary | ICD-10-CM | POA: Diagnosis not present

## 2016-02-05 DIAGNOSIS — Z7901 Long term (current) use of anticoagulants: Secondary | ICD-10-CM

## 2016-02-05 DIAGNOSIS — M86671 Other chronic osteomyelitis, right ankle and foot: Secondary | ICD-10-CM

## 2016-02-05 DIAGNOSIS — I480 Paroxysmal atrial fibrillation: Secondary | ICD-10-CM

## 2016-02-05 DIAGNOSIS — E785 Hyperlipidemia, unspecified: Secondary | ICD-10-CM

## 2016-02-05 LAB — POCT INR: INR: 1.7

## 2016-02-05 NOTE — Progress Notes (Signed)
PCP: Pearson Grippe, Roberto Reilly  Clinic Note: Chief Complaint  Patient presents with  . Follow-up    cad, AAA, ?PAD    HPI: Roberto Reilly is a 82 y.o. male with a PMH below who presents today for ~ annual f/u for CAD-CABG. He is a former patient of Dr. Caprice Kluver. He has a history of CABG in 2007 following catheterization catheterization for dyspnea on exertion. He also has long-standing intermittently oxygen requiring COPD. He has been reluctant to undergo non-invasive ischemic evaluations in the past unless symptoms warrant.  Roberto Reilly was last seen on Feb 23, 2015.  Doing OK.   Recent Hospitalizations:   May - partial foot amputation (across metatarsals) for great toe infection / Osteo  In June 2017 for PNA (shortly after his wife was hospitalized & died) - was after wife's funeral ; was run down having to deal with wife's death - poor sleep, lots of running around  Studies Reviewed:  Echo 2017:  - Left ventricle: The cavity size was normal. There was mild focal  basal hypertrophy of the septum. Septal bounce. Post op septal hypokinesis. Systolic function was normal. EF 55% to 60%. Wall motion was normal;There was no   evidence of elevated ventricular filling pressure by Doppler parameters. - Left atrium: The atrium was mildly dilated.  Abd [place]  Interval History: Roberto Reilly presents today relatively stable. No active cardiac symptoms. More back to normal following PNA.  He indicates that he has no more SOB than usual.   Gets winded when walking & has more home chores now (with wife passing).  Not as much time to exercise.  Still walks a lot & goes out to eat ~1x daily.  [place] walks around most days.    He has a wound on his right shin that appears to be feeling relatively well. He also notes evidence of pretty significant varicose veins in that leg compared to the left leg which was where his veins removed for his CABG.  No chest pain  with rest or exertion.  No PND,  orthopnea No palpitations, lightheadedness, dizziness, weakness or syncope/near syncope. No TIA/amaurosis fugax symptoms. No claudication.  ROS: A comprehensive was performed. Review of Systems  Constitutional: Negative for malaise/fatigue.  HENT: Negative for nosebleeds.   Respiratory: Positive for cough, shortness of breath and wheezing.        Basic chronic COPD related dyspnea. No change /difference  Cardiovascular: Negative for claudication.  Gastrointestinal: Negative for blood in stool, heartburn and melena.  Genitourinary: Negative for hematuria.  Musculoskeletal: Positive for joint pain (Hips and knees mostly).  Neurological: Negative for dizziness and headaches.  Psychiatric/Behavioral: Negative.  Negative for depression and memory loss. The patient is not nervous/anxious and does not have insomnia.   All other systems reviewed and are negative.   Past Medical History:  Diagnosis Date  . AAA (abdominal aortic aneurysm) (HCC)    4.2 X 4.8 cm 04/2013 CT; declined re-eval 02/25/15  . Anxiety   . CAD in native artery March 2007   Cath for dyspnea on exertion: 75% distal LM, 85% RI, 95% mid-distal Cx, in multiple RCA lesions with 95% distal. --> Referred for CABG; has declined further noninvasive evaluation in the absence of worsening symptoms  . COPD (chronic obstructive pulmonary disease) with emphysema (HCC) 05/23/2007   Hosp 5/29-6/01/12- COPD exacerbation ONOX 12/08/10- desaturated to less than 88% for over an hour, qualifying for home O2 during sleep    .  Diverticulosis of colon with hemorrhage 05/24/2013  . Dyslipidemia, goal LDL below 70   . H/O echocardiogram March ; January 2015   a) Normal LV size and cavity appeared normal function. Grade 1 diastolic dysfunction. Limited study.; b) normal LV size and function with EF 60-65%. Aortic sclerosis without stenosis, mildly increased PA pressures of 44mmHg but normal IVC and RA/RV size  . History of non-ST Elevation MI  (myocardial infarction) March 2007   Admitted with COPD exacerbation, given by CHF. Had some chest pain with positive troponins. Cath showed multivessel disease, referred for CABG  . History of pneumonia   . Hypertension   . Hypoxia     chronic, on home O2  . Myocardial infarction (HCC)   . Obesity (BMI 30-39.9)    One year ago weighed 265 pounds, (12/2012) -- now 234 pounds  . PAF (paroxysmal atrial fibrillation) (HCC)    Anticoagulated on warfarin. Stable -- post op  . Pneumonia    hx  . 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 Seasonal allergies   . Shortness of breath dyspnea     Past Surgical History:  Procedure Laterality Date  . ACHILLES TENDON LENGTHENING Right 10/08/2015   Procedure: ACHILLES TENDON LENGTHENING;  Surgeon: Roberto ArthursJohn Hewitt, Roberto Reilly;  Location: [facility] OR;  Service: Orthopedics;  Laterality: Right;  . AMPUTATION Right 10/08/2015   Procedure: RIGHT FOOT TRANSMET AMPUTATION;  Surgeon: Roberto ArthursJohn Hewitt, Roberto Reilly;  Location: [facility] OR;  Service: Orthopedics;  Laterality: Right;  . CARDIAC CATHETERIZATION  03 28 2007   NORMAL LV FUNCTION/ ABDOMINAL AORTA STENOSIS,75%-85%. RIGHT FEMORAL ARTERY :CATHETERS USED A  4-FRENCH WITH A 4-FRENCH SHEATH  . CATARACT EXTRACTION     Laser  . COLONOSCOPY N/A 05/24/2013   Procedure: COLONOSCOPY;  Surgeon: Roberto Booparl E Gessner, Roberto Reilly;  Location: [facility] END[facility]COPY;  Service: Endoscopy;  Laterality: N/A;  . CORONARY ARTERY BYPASS GRAFT    . RLL resection for Hamartoma     lungs  . RUL for hamartoma     lung    Prior to Admission medications   Medication Sig Start Date Taking? Authorizing Provider  amLODipine (NORVASC) 10 MG tablet Take 1 tablet (10 mg total) by mouth daily. 05/01/13  Roberto OgleIskra M Myers, Roberto Reilly  aspirin 81 MG tablet Take 81 mg by mouth daily.     Historical Provider, Roberto Reilly  azithromycin (ZITHROMAX) 250 MG tablet Take as directed 02/03/15  Roberto Budgelinton D Young, Roberto Reilly  budesonide-formoterol [facility](SYMBICORT) 160-4.5 MCG/ACT inhaler Inhale 2 puffs into the lungs  2 (two) times daily. 03/11/14  Roberto Budgelinton D Young, Roberto Reilly  cholecalciferol (VITAMIN D) 1000 UNITS tablet Take 1,000 Units by mouth daily.   Historical Provider, Roberto Reilly  furosemide (LASIX) 40 MG tablet TAKE ONE TABLET BY MOUTH EVERY MORNING 02/19/15  Roberto Lexavid W Eluzer Howdeshell, Roberto Reilly  ipratropium-albuterol (DUONEB) 0.5-2.5 (3) MG/3ML SOLN Take 3 mLs by nebulization every 6 (six) hours as needed (wheezing and shortness of breath).    Historical Provider, Roberto Reilly  KLOR-CON M20 20 MEQ tablet TAKE 1 TABLET (20 MEQ TOTAL) BY MOUTH DAILY. 01/05/15  Roberto Lexavid W Daemien Fronczak, Roberto Reilly  Potassium Chloride ER 20 MEQ TBCR TAKE ONE TABLET BY MOUTH EVERY MORNING 01/07/14  Roberto Lexavid W Jaquell Seddon, Roberto Reilly  sertraline (ZOLOFT) 50 MG tablet Take 50 mg by mouth daily.   Historical Provider, Roberto Reilly  simvastatin (ZOCOR) 20 MG tablet Take 20 mg by mouth daily. 06/18/13  Historical Provider, Roberto Reilly; Labs checked by PCP  warfarin (COUMADIN) 2.5 MG tablet TAKE 1-2 TABLETS BY  MOUTH DAILY AS DIRECTED BY COUMADIN CLINIC 06/03/14  Roberto Lex, Roberto Reilly    Allergies  Allergen Reactions  . Prednisone Other (See Comments)    Makes pt jittery and nervous    Social History   Social History  . Marital status: Married    Spouse name: N/A  . Number of children: 2  . Years of education: N/A   Occupational History  . Retail Sales    Social History Main Topics  . Smoking status: Former Smoker    Packs/day: 2.00    Years: 55.00    Types: Cigarettes    Quit date: 05/23/1998  . Smokeless tobacco: Never Used  . Alcohol use No  . Drug use: No  . Sexual activity: Not Asked   Other Topics Concern  . None   Social History Narrative   He is a married father of 2, grandfather of 24, does not get routine exercise. He is a former smoker, but does not currently or not drink alcohol.    He is modifying his diet and trying to get activity but is doing better with the diet than the activity.    He is under a lot of stress.    He was the caregiver for his wife, who was in poor health. She died  11/13/2015.   Family History  Problem Relation Age of Onset  . Heart attack Mother   . Heart attack Father      Wt Readings from Last 3 Encounters:  02/05/16 212 lb (96.2 kg)  11/26/15 203 lb 12.8 oz (92.4 kg)  11/05/15 209 lb 14.1 oz (95.2 kg)  -- evidently trying to lose weight. Is doing more exercise and trying to adjust his diet to  PHYSICAL EXAM BP 134/62   Pulse 82   Ht 6' (1.829 m)   Wt 212 lb (96.2 kg)   BMI 28.75 kg/m   General appearance: alert, cooperative, appears stated age, no distress and Sits in Tripod position with loud expiratory wheezes - but no distress.  pleasant mood & affect Neck: no adenopathy, no carotid bruit, no JVD, supple, symmetrical, trachea midline, thyroid not enlarged, symmetric, no tenderness/mass/nodules and unable to truly assess JVP due to short neck Lungs: barrel chest, prolonged Exp phase. diffuse interstitial sounds & upper airway "wheezing".  No significant rales/rhonchi Heart: RRR, S1 & S2 normal.  + S4. No R/M. Distant heart sounds. Unable to palpate PMI Abdomen: soft, non-tender; bowel sounds normal; no masses,  no organomegaly Extremities: varicose veins noted, venous stasis dermatitis noted and minimal edema'; Wearing steel toe boots for foot support with amputation. Pulses: 2+ and symmetric upper Ext - weak pulses bilateral fee.  Skin: Skin color, texture, turgor normal. No rashes or lesions or venous stasis dermatitis. Neurologic: Mental status: Alert, oriented, thought content appropriate Cranial nerves: normal    Adult ECG Report n/a   Other studies Reviewed: Additional studies/ records that were reviewed today include:  Recent Labs:  Labs not available    ASSESSMENT / PLAN: Problem List Items Addressed This Visit    Paroxysmal atrial fibrillation (HCC); CHA2DS2-VASc Score = 4. On Warfarin (Chronic)    He seems to be in sinus rhythm now. Not on anything for rate control. On warfarin for anticoagulation.      Long term  (current) use of anticoagulants (Chronic)    ON warfarin.  Stable. No bleeding issues.      Essential hypertension (Chronic)    Well controlled on Amlodipine.  Next choice if  BP increases would be ARB. On lasix.      Dyslipidemia, goal LDL below 70 (Chronic)    On simvastatin - monitored by PCP --> consider converting to atorvastatin or crestor. No labs available.      Chronic osteomyelitis of right foot (HCC)    ? If this wound healing issue is related to PAD vs. Venous insufficiency -> check for PAD with LEA Dopplers / ABI.      Relevant Orders   VAS [place] LOWER EXTREMITY ARTERIAL DUPLEX   CAD in native artery - LM, RCA, RI & Cx disease --> CABG x 4 (LIMA-LAD, SVG-OM/RI, SVG-rPDA-rPL) - Primary (Chronic)    No active anginal symptoms. He has chronic exertional dyspnea, but it seems to be more related to COPD. He tolerated his pneumonia and partial foot amputation without significant problems. He is not very active as far as exercise, but seems to be relatively a symptomatically from a angina standpoint with the left hip of activity he does. Not on a beta blocker because of COPD, he is on calcium channel blocker for anti-anginal effect. He is on aspirin and simvastatin.      Relevant Orders   VAS [place] LOWER EXTREMITY ARTERIAL DUPLEX   AAA (abdominal aortic aneurysm) without rupture seen on CT scan (Chronic)    Abd [place] checked this summer did not correlate with prior CT findings - reassess next year. With foot ulcer & amputation - ? If PAD vs. Venous Insufficiency --> check LEA ABI/Dopplers.      Relevant Orders   VAS [place] LOWER EXTREMITY ARTERIAL DUPLEX    Other Visit Diagnoses   None.     Current medicines are reviewed at length with the patient today. (+/- concerns) none The following changes have been made: no medication changes  No other changes -   Your physician wants you to follow-up in  6 MONTHS WITH DR Lunden Stieber --30 MIN    Studies Ordered:   No orders of the  defined types were placed in this encounter.     Roberto Reilly, M.D., M.S. Interventional Cardiologist   Pager # [phone]

## 2016-02-05 NOTE — Patient Instructions (Signed)
SCHEDULE [address] Your physician has requested that you have a lower  extremity arterial duplex. This test is an ultrasound of the arteries in the legs or . It looks at arterial blood flow in the legs a. Allow one hour for Lower and Arterial scans. There are no restrictions or special instructions    NO OTHER CHANGES WITH MEDICATIONS    Your physician wants you to follow-up in:6 MONTHS WITH DR HARDING.  You will receive a reminder letter in the mail two months in advance. If you don't receive a letter, please call our office to schedule the follow-up appointment.   If you need a refill on your cardiac medications before your next appointment, please call your pharmacy.

## 2016-02-07 ENCOUNTER — Encounter: Payer: Self-pay | Admitting: Cardiology

## 2016-02-07 ENCOUNTER — Other Ambulatory Visit: Payer: Self-pay | Admitting: Cardiology

## 2016-02-07 NOTE — Assessment & Plan Note (Signed)
On simvastatin - monitored by PCP --> consider converting to atorvastatin or crestor. No labs available.

## 2016-02-07 NOTE — Assessment & Plan Note (Signed)
No active anginal symptoms. He has chronic exertional dyspnea, but it seems to be more related to COPD. He tolerated his pneumonia and partial foot amputation without significant problems. He is not very active as far as exercise, but seems to be relatively a symptomatically from a angina standpoint with the left hip of activity he does. Not on a beta blocker because of COPD, he is on calcium channel blocker for anti-anginal effect. He is on aspirin and simvastatin.

## 2016-02-07 NOTE — Assessment & Plan Note (Signed)
Well controlled on Amlodipine.  Next choice if BP increases would be ARB. On lasix.

## 2016-02-07 NOTE — Assessment & Plan Note (Addendum)
ON warfarin.  Stable. No bleeding issues.

## 2016-02-07 NOTE — Assessment & Plan Note (Signed)
?   If this wound healing issue is related to PAD vs. Venous insufficiency -> check for PAD with LEA Dopplers / ABI.

## 2016-02-07 NOTE — Assessment & Plan Note (Signed)
He seems to be in sinus rhythm now. Not on anything for rate control. On warfarin for anticoagulation.

## 2016-02-07 NOTE — Assessment & Plan Note (Signed)
Abd US checked this summer did not correlate with prior CT findings - reassess next year. With foot ulcer & amputation - ? If PAD vs. Venous Insufficiency --> check LEA ABI/Dopplers.

## 2016-02-09 ENCOUNTER — Other Ambulatory Visit: Payer: Self-pay | Admitting: Cardiology

## 2016-02-09 DIAGNOSIS — R0989 Other specified symptoms and signs involving the circulatory and respiratory systems: Secondary | ICD-10-CM

## 2016-02-15 ENCOUNTER — Ambulatory Visit (HOSPITAL_COMMUNITY)
Admission: RE | Admit: 2016-02-15 | Discharge: 2016-02-15 | Disposition: A | Discharge status: Home / Self Care | Payer: Medicare Other | Source: Ambulatory Visit | Attending: Cardiology | Admitting: Cardiology

## 2016-02-15 DIAGNOSIS — I998 Other disorder of circulatory system: Secondary | ICD-10-CM | POA: Diagnosis not present

## 2016-02-15 DIAGNOSIS — I2510 Atherosclerotic heart disease of native coronary artery without angina pectoris: Secondary | ICD-10-CM | POA: Insufficient documentation

## 2016-02-15 DIAGNOSIS — I700 Atherosclerosis of aorta: Secondary | ICD-10-CM | POA: Insufficient documentation

## 2016-02-15 DIAGNOSIS — I743 Embolism and thrombosis of arteries of the lower extremities: Secondary | ICD-10-CM | POA: Diagnosis not present

## 2016-02-15 DIAGNOSIS — I714 Abdominal aortic aneurysm, without rupture, unspecified: Secondary | ICD-10-CM

## 2016-02-15 DIAGNOSIS — M86671 Other chronic osteomyelitis, right ankle and foot: Secondary | ICD-10-CM | POA: Insufficient documentation

## 2016-02-15 DIAGNOSIS — R0989 Other specified symptoms and signs involving the circulatory and respiratory systems: Secondary | ICD-10-CM | POA: Diagnosis not present

## 2016-02-15 DIAGNOSIS — I739 Peripheral vascular disease, unspecified: Secondary | ICD-10-CM | POA: Diagnosis not present

## 2016-02-17 ENCOUNTER — Telehealth: Payer: Self-pay | Admitting: *Deleted

## 2016-02-17 NOTE — Telephone Encounter (Signed)
-----   Message from Marykay Lexavid W Harding, MD sent at 02/16/2016  7:15 PM EDT ----- LE Arterial Doppler Results:  50-74% bilteral SFA tandem stenosis.  50-74% left popliteal artery stenosis.   Occluded right peroneal artery, two vessel run-off on the right.  Thress vessel run-off, on the left.  Suggest PV consult. -- [facility]. Bryan Lemmaavid Harding, MD

## 2016-02-17 NOTE — Telephone Encounter (Signed)
Spoke to patient. Result given . Verbalized understanding Schedule appointment with Dr Kirke CorinArida. Offered the next available . Patient states he needs a late morning appointment or afternoon.  patient aware we only have [facility]- appt schedule for 03/22/16 at 11 am Patient verbalized understanding

## 2016-03-04 ENCOUNTER — Other Ambulatory Visit: Payer: Self-pay | Admitting: Pharmacist Clinician (PhC)/ Clinical Pharmacy Specialist

## 2016-03-04 ENCOUNTER — Ambulatory Visit (INDEPENDENT_AMBULATORY_CARE_PROVIDER_SITE_OTHER): Payer: Medicare Other | Admitting: Pharmacist Clinician (PhC)/ Clinical Pharmacy Specialist

## 2016-03-04 DIAGNOSIS — Z7901 Long term (current) use of anticoagulants: Secondary | ICD-10-CM

## 2016-03-04 DIAGNOSIS — I4891 Unspecified atrial fibrillation: Secondary | ICD-10-CM | POA: Diagnosis not present

## 2016-03-04 LAB — POCT INR: INR: 1.8

## 2016-03-04 MED ORDER — WARFARIN SODIUM 2.5 MG PO TABS: ORAL_TABLET | ORAL | 1 refills | Status: DC

## 2016-03-22 ENCOUNTER — Ambulatory Visit: Payer: Medicare Other | Admitting: Pharmacist

## 2016-03-22 ENCOUNTER — Ambulatory Visit (INDEPENDENT_AMBULATORY_CARE_PROVIDER_SITE_OTHER): Payer: Medicare Other | Admitting: Cardiovascular Disease

## 2016-03-22 ENCOUNTER — Encounter: Payer: Self-pay | Admitting: Cardiovascular Disease

## 2016-03-22 VITALS — BP 154/81 | HR 79 | Ht 72.0 in | Wt 223.0 lb

## 2016-03-22 DIAGNOSIS — I739 Peripheral vascular disease, unspecified: Secondary | ICD-10-CM | POA: Diagnosis not present

## 2016-03-22 DIAGNOSIS — I2510 Atherosclerotic heart disease of native coronary artery without angina pectoris: Secondary | ICD-10-CM

## 2016-03-22 DIAGNOSIS — R0989 Other specified symptoms and signs involving the circulatory and respiratory systems: Secondary | ICD-10-CM

## 2016-03-22 LAB — POCT INR: INR: 1.8

## 2016-03-22 NOTE — Patient Instructions (Signed)

## 2016-03-22 NOTE — Progress Notes (Signed)
Cardiology Office Note   Date:  03/22/2016   ID:  Roberto Reilly, DOB 11-Oct-1933, MRN [id number]  PCP:  Pearson Grippe, MD  Cardiologist:  Dr. Herbie Baltimore  No chief complaint on file.     History of Present Illness: Roberto Reilly is a 82 y.o. male who was referred By Dr. Herbie Baltimore for evaluation and management of peripheral arterial disease. He has known history of coronary artery disease status post CABG in 2007, severe COPD, obesity, peripheral arterial disease with abdominal aortic aneurysm and paroxysmal atrial fibrillation. He is not diabetic and quit smoking in 2000. The patient developed a nonhealing ulcer on the right big toe after he picked a callus. He self treated himself and ultimately developed an infection that progressed to osteomyelitis involving all toes. He underwent right foot transmetatarsal amputation in May 2017. He is completely since then and currently he has no open wounds or ulceration. He denies leg claudication. He underwent noninvasive vascular evaluation which showed normal ABI on the right and noncompressible on the left. Duplex showed moderate bilateral SFA disease and moderate left popliteal artery stenosis with 2 vessel runoff below the knee on the right.   Past Medical History:  Diagnosis Date  . AAA (abdominal aortic aneurysm) (HCC)    4.2 X 4.8 cm 04/2013 CT; declined re-eval 02/25/15  . Anxiety   . CAD in native artery March 2007   Cath for dyspnea on exertion: 75% distal LM, 85% RI, 95% mid-distal Cx, in multiple RCA lesions with 95% distal. --> Referred for CABG; has declined further noninvasive evaluation in the absence of worsening symptoms  . COPD (chronic obstructive pulmonary disease) with emphysema (HCC) 05/23/2007   Hosp 5/29-6/01/12- COPD exacerbation ONOX 12/08/10- desaturated to less than 88% for over an hour, qualifying for home O2 during sleep    . Diverticulosis of colon with hemorrhage 05/24/2013  . Dyslipidemia, goal LDL below 70   . H/O  echocardiogram March ; January 2015   a) Normal LV size and cavity appeared normal function. Grade 1 diastolic dysfunction. Limited study.; b) normal LV size and function with EF 60-65%. Aortic sclerosis without stenosis, mildly increased PA pressures of but normal IVC and RA/RV size  . History of non-ST Elevation MI (myocardial infarction) March 2007   Admitted with COPD exacerbation, given by CHF. Had some chest pain with positive troponins. Cath showed multivessel disease, referred for CABG  . History of pneumonia   . Hypertension   . Hypoxia     chronic, on home O2  . Myocardial infarction   . Obesity (BMI 30-39.9)    One year ago weighed 265 pounds, (12/2012) -- now 234 pounds  . PAF (paroxysmal atrial fibrillation) (HCC)    Anticoagulated on warfarin. Stable -- post op  . Pneumonia    hx  . 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 Seasonal allergies   . Shortness of breath dyspnea     Past Surgical History:  Procedure Laterality Date  . ACHILLES TENDON LENGTHENING Right 10/08/2015   Procedure: ACHILLES TENDON LENGTHENING;  Surgeon: Toni Arthurs, MD;  Location: [facility] OR;  Service: Orthopedics;  Laterality: Right;  . AMPUTATION Right 10/08/2015   Procedure: RIGHT FOOT TRANSMET AMPUTATION;  Surgeon: Toni Arthurs, MD;  Location: [facility] OR;  Service: Orthopedics;  Laterality: Right;  . CARDIAC CATHETERIZATION  03 28 2007   NORMAL LV FUNCTION/ ABDOMINAL AORTA STENOSIS,75%-85%. RIGHT FEMORAL ARTERY :CATHETERS USED A  4-FRENCH WITH A  4-FRENCH SHEATH  . CATARACT EXTRACTION     Laser  . COLONOSCOPY N/A 05/24/2013   Procedure: COLONOSCOPY;  Surgeon: Iva Booparl E Gessner, MD;  Location: [facility];  Service: Endoscopy;  Laterality: N/A;  . CORONARY ARTERY BYPASS GRAFT    . RLL resection for Hamartoma     lungs  . RUL for hamartoma     lung     Current Outpatient Prescriptions  Medication Sig Dispense Refill  . amLODipine (NORVASC) 10 MG tablet Take 1 tablet (10 mg  total) by mouth daily. 30 tablet 1  . aspirin EC 81 MG tablet Take 81 mg by mouth daily.    . budesonide-formoterol (SYMBICORT) 160-4.5 MCG/ACT inhaler Inhale 2 puffs into the lungs 2 (two) times daily. 2 Inhaler 0  . cholecalciferol (VITAMIN D) 1000 UNITS tablet Take 1,000 Units by mouth daily.    . furosemide (LASIX) 40 MG tablet TAKE ONE TABLET BY MOUTH EVERY MORNING 90 tablet 1  . ipratropium-albuterol (DUONEB) 0.5-2.5 (3) MG/3ML SOLN Take 3 mLs by nebulization 4 (four) times daily.     Marland Kitchen. KLOR-CON M20 20 MEQ tablet TAKE 1 TABLET (20 MEQ TOTAL) BY MOUTH DAILY. 30 tablet 9  . OXYGEN Inhale 2 L into the lungs as needed (shortness of breath).     . simvastatin (ZOCOR) 40 MG tablet Take 20 mg by mouth daily.    Marland Kitchen. warfarin (COUMADIN) 2.5 MG tablet Take 1-2 tablets by mouth daily as directed by coumadin clinic 150 tablet 1   No current facility-administered medications for this visit.     Allergies:   Prednisone    Social History:  The patient  reports that he quit smoking about 17 years ago. His smoking use included Cigarettes. He has a 110.00 pack-year smoking history. He has never used smokeless tobacco. He reports that he does not drink alcohol or use drugs.   Family History:  The patient's family history includes Heart attack in his father and mother.    ROS:  Please see the history of present illness.   Otherwise, review of systems are positive for none.   All other systems are reviewed and negative.    PHYSICAL EXAM: VS:  BP (!) 154/81   Pulse 79   Ht 6' (1.829 m)   Wt 223 lb (101.2 kg)   BMI 30.24 kg/m  , BMI Body mass index is 30.24 kg/m. GEN: Well nourished, well developed, in no acute distress  HEENT: normal  Neck: no JVD, carotid bruits, or masses Cardiac: RRR; no murmurs, rubs, or gallops,no edema  Respiratory:  clear to auscultation bilaterally, normal work of breathing GI: soft, nontender, nondistended, + BS MS: no deformity or atrophy  Skin: warm and dry, no  rash Neuro:  Strength and sensation are intact Psych: euthymic mood, full affect Vascular: Femoral pulses are normal. Distal pulses are not palpable. Amputation site is completely healed.  EKG:  EKG is not ordered today.    Recent Labs: 11/02/2015: ALT 13; B Natriuretic Peptide 710.5; Magnesium 2.3; TSH 0.137 11/05/2015: BUN 30; Creatinine, Ser 0.87; Hemoglobin 12.1; Platelets 239; Potassium 3.7; Sodium 138    Lipid Panel    Component Value Date/Time   CHOL 181 04/30/2013 0537   TRIG 86 04/30/2013 0537   HDL 43 04/30/2013 0537   CHOLHDL 4.2 04/30/2013 0537   VLDL 17 04/30/2013 0537   LDLCALC 121 (H) 04/30/2013 0537      Wt Readings from Last 3 Encounters:  03/22/16 223 lb (101.2 kg)  02/05/16  212 lb (96.2 kg)  11/26/15 203 lb 12.8 oz (92.4 kg)       No flowsheet data found.    ASSESSMENT AND PLAN:  1.  Peripheral arterial disease: The patient does have diffuse peripheral arterial disease but does not seem to be critical. He currently has no open wounds and denies any claudication or rest pain. His previous right transmetatarsal amputation site has healed completely. Thus, at the present time, there is no indication for angiography or endovascular intervention. I recommend continuing medical therapy.  2. Paroxysmal atrial fibrillation: Stable on long-term anticoagulation.  3. Coronary artery disease involving native coronary arteries without angina: Continue medical therapy.   Disposition:   FU with me as needed if he develops claudication or lower extremity wounds.  Signed,  Lorine BearsMuhammad Arida, MD  03/22/2016 6:02 PM    [facility]

## 2016-03-28 DIAGNOSIS — Z23 Encounter for immunization: Secondary | ICD-10-CM | POA: Diagnosis not present

## 2016-04-05 ENCOUNTER — Ambulatory Visit (INDEPENDENT_AMBULATORY_CARE_PROVIDER_SITE_OTHER): Payer: Medicare Other | Admitting: Pharmacist Clinician (PhC)/ Clinical Pharmacy Specialist

## 2016-04-05 DIAGNOSIS — I4891 Unspecified atrial fibrillation: Secondary | ICD-10-CM | POA: Diagnosis not present

## 2016-04-05 DIAGNOSIS — I2510 Atherosclerotic heart disease of native coronary artery without angina pectoris: Secondary | ICD-10-CM | POA: Diagnosis not present

## 2016-04-05 DIAGNOSIS — Z7901 Long term (current) use of anticoagulants: Secondary | ICD-10-CM

## 2016-04-05 LAB — POCT INR: INR: 1.7

## 2016-04-26 ENCOUNTER — Encounter (HOSPITAL_COMMUNITY): Payer: Self-pay

## 2016-04-26 ENCOUNTER — Inpatient Hospital Stay (HOSPITAL_COMMUNITY)
Admission: EM | Admit: 2016-04-26 | Discharge: 2016-04-30 | DRG: 190 | Disposition: A | Discharge status: Home / Self Care | Payer: Medicare Other | Attending: Internal Medicine | Admitting: Internal Medicine

## 2016-04-26 ENCOUNTER — Emergency Department (HOSPITAL_COMMUNITY): Payer: Medicare Other

## 2016-04-26 DIAGNOSIS — I2510 Atherosclerotic heart disease of native coronary artery without angina pectoris: Secondary | ICD-10-CM | POA: Diagnosis present

## 2016-04-26 DIAGNOSIS — Z6829 Body mass index (BMI) 29.0-29.9, adult: Secondary | ICD-10-CM | POA: Diagnosis not present

## 2016-04-26 DIAGNOSIS — I252 Old myocardial infarction: Secondary | ICD-10-CM | POA: Diagnosis not present

## 2016-04-26 DIAGNOSIS — I10 Essential (primary) hypertension: Secondary | ICD-10-CM | POA: Diagnosis present

## 2016-04-26 DIAGNOSIS — E669 Obesity, unspecified: Secondary | ICD-10-CM | POA: Diagnosis present

## 2016-04-26 DIAGNOSIS — J449 Chronic obstructive pulmonary disease, unspecified: Secondary | ICD-10-CM | POA: Diagnosis not present

## 2016-04-26 DIAGNOSIS — R0602 Shortness of breath: Secondary | ICD-10-CM | POA: Diagnosis not present

## 2016-04-26 DIAGNOSIS — Z8249 Family history of ischemic heart disease and other diseases of the circulatory system: Secondary | ICD-10-CM

## 2016-04-26 DIAGNOSIS — Z951 Presence of aortocoronary bypass graft: Secondary | ICD-10-CM | POA: Diagnosis not present

## 2016-04-26 DIAGNOSIS — Z7982 Long term (current) use of aspirin: Secondary | ICD-10-CM | POA: Diagnosis not present

## 2016-04-26 DIAGNOSIS — Z87891 Personal history of nicotine dependence: Secondary | ICD-10-CM

## 2016-04-26 DIAGNOSIS — Z89431 Acquired absence of right foot: Secondary | ICD-10-CM | POA: Diagnosis not present

## 2016-04-26 DIAGNOSIS — I714 Abdominal aortic aneurysm, without rupture, unspecified: Secondary | ICD-10-CM | POA: Diagnosis present

## 2016-04-26 DIAGNOSIS — Z66 Do not resuscitate: Secondary | ICD-10-CM | POA: Diagnosis present

## 2016-04-26 DIAGNOSIS — E878 Other disorders of electrolyte and fluid balance, not elsewhere classified: Secondary | ICD-10-CM | POA: Diagnosis present

## 2016-04-26 DIAGNOSIS — Z7951 Long term (current) use of inhaled steroids: Secondary | ICD-10-CM

## 2016-04-26 DIAGNOSIS — R05 Cough: Secondary | ICD-10-CM | POA: Diagnosis not present

## 2016-04-26 DIAGNOSIS — Z7901 Long term (current) use of anticoagulants: Secondary | ICD-10-CM | POA: Diagnosis not present

## 2016-04-26 DIAGNOSIS — J441 Chronic obstructive pulmonary disease with (acute) exacerbation: Principal | ICD-10-CM | POA: Diagnosis present

## 2016-04-26 DIAGNOSIS — R069 Unspecified abnormalities of breathing: Secondary | ICD-10-CM | POA: Diagnosis not present

## 2016-04-26 DIAGNOSIS — Z9981 Dependence on supplemental oxygen: Secondary | ICD-10-CM

## 2016-04-26 DIAGNOSIS — J9621 Acute and chronic respiratory failure with hypoxia: Secondary | ICD-10-CM | POA: Diagnosis present

## 2016-04-26 DIAGNOSIS — Z902 Acquired absence of lung [part of]: Secondary | ICD-10-CM | POA: Diagnosis not present

## 2016-04-26 DIAGNOSIS — E872 Acidosis: Secondary | ICD-10-CM | POA: Diagnosis present

## 2016-04-26 DIAGNOSIS — R0902 Hypoxemia: Secondary | ICD-10-CM

## 2016-04-26 DIAGNOSIS — Z8701 Personal history of pneumonia (recurrent): Secondary | ICD-10-CM

## 2016-04-26 DIAGNOSIS — E785 Hyperlipidemia, unspecified: Secondary | ICD-10-CM | POA: Diagnosis present

## 2016-04-26 DIAGNOSIS — J9622 Acute and chronic respiratory failure with hypercapnia: Secondary | ICD-10-CM | POA: Diagnosis present

## 2016-04-26 DIAGNOSIS — I480 Paroxysmal atrial fibrillation: Secondary | ICD-10-CM | POA: Diagnosis present

## 2016-04-26 DIAGNOSIS — E875 Hyperkalemia: Secondary | ICD-10-CM | POA: Diagnosis present

## 2016-04-26 DIAGNOSIS — I739 Peripheral vascular disease, unspecified: Secondary | ICD-10-CM | POA: Diagnosis present

## 2016-04-26 LAB — BASIC METABOLIC PANEL
ANION GAP: 9 (ref 5–15)
BUN: 24 mg/dL — ABNORMAL HIGH (ref 6–20)
CALCIUM: 8.8 mg/dL — AB (ref 8.9–10.3)
CO2: 31 mmol/L (ref 22–32)
Chloride: 98 mmol/L — ABNORMAL LOW (ref 101–111)
Creatinine, Ser: 1.1 mg/dL (ref 0.61–1.24)
Glucose, Bld: 152 mg/dL — ABNORMAL HIGH (ref 65–99)
POTASSIUM: 4.8 mmol/L (ref 3.5–5.1)
SODIUM: 138 mmol/L (ref 135–145)

## 2016-04-26 LAB — CBC
HEMATOCRIT: 43.4 % (ref 39.0–52.0)
HEMOGLOBIN: 14.1 g/dL (ref 13.0–17.0)
MCH: 29 pg (ref 26.0–34.0)
MCHC: 32.5 g/dL (ref 30.0–36.0)
MCV: 89.3 fL (ref 78.0–100.0)
Platelets: 228 10*3/uL (ref 150–400)
RBC: 4.86 MIL/uL (ref 4.22–5.81)
RDW: 15.5 % (ref 11.5–15.5)
WBC: 12.7 10*3/uL — AB (ref 4.0–10.5)

## 2016-04-26 LAB — BRAIN NATRIURETIC PEPTIDE: B Natriuretic Peptide: 247.3 pg/mL — ABNORMAL HIGH (ref 0.0–100.0)

## 2016-04-26 LAB — BLOOD GAS, ARTERIAL
Acid-Base Excess: 5 mmol/L — ABNORMAL HIGH (ref 0.0–2.0)
BICARBONATE: 32.5 mmol/L — AB (ref 20.0–28.0)
Drawn by: 295031
O2 CONTENT: 3 L/min
O2 SAT: 92.5 %
PCO2 ART: 63.7 mmHg — AB (ref 32.0–48.0)
PO2 ART: 68.7 mmHg — AB (ref 83.0–108.0)
Patient temperature: 98.6
pH, Arterial: 7.328 — ABNORMAL LOW (ref 7.350–7.450)

## 2016-04-26 LAB — PROTIME-INR
INR: 2.66
PROTHROMBIN TIME: 28.9 s — AB (ref 11.4–15.2)

## 2016-04-26 LAB — I-STAT TROPONIN, ED: TROPONIN I, POC: 0.01 ng/mL (ref 0.00–0.08)

## 2016-04-26 MED ORDER — ALBUTEROL (5 MG/ML) CONTINUOUS INHALATION SOLN
10.0000 mg/h | INHALATION_SOLUTION | Freq: Once | RESPIRATORY_TRACT | Status: AC
  Administered 2016-04-26: 10 mg/h via RESPIRATORY_TRACT
  Filled 2016-04-26: qty 20

## 2016-04-26 MED ORDER — WARFARIN - PHARMACIST DOSING INPATIENT: Freq: Every day | Status: DC

## 2016-04-26 MED ORDER — IPRATROPIUM-ALBUTEROL 0.5-2.5 (3) MG/3ML IN SOLN
3.0000 mL | Freq: Once | RESPIRATORY_TRACT | Status: AC
  Administered 2016-04-26: 3 mL via RESPIRATORY_TRACT
  Filled 2016-04-26: qty 3

## 2016-04-26 MED ORDER — ONDANSETRON HCL 4 MG PO TABS: 4.0000 mg | ORAL_TABLET | Freq: Four times a day (QID) | ORAL | Status: DC | PRN

## 2016-04-26 MED ORDER — POTASSIUM CHLORIDE CRYS ER 20 MEQ PO TBCR
20.0000 meq | EXTENDED_RELEASE_TABLET | Freq: Every day | ORAL | Status: DC
Start: 2016-04-27 — End: 2016-04-28
  Administered 2016-04-27: 20 meq via ORAL
  Filled 2016-04-26 (×2): qty 1

## 2016-04-26 MED ORDER — FUROSEMIDE 40 MG PO TABS
40.0000 mg | ORAL_TABLET | Freq: Every morning | ORAL | Status: DC
  Administered 2016-04-27 – 2016-04-30 (×4): 40 mg via ORAL
  Filled 2016-04-26: qty 1
  Filled 2016-04-26: qty 2
  Filled 2016-04-26 (×2): qty 1

## 2016-04-26 MED ORDER — MAGNESIUM SULFATE 50 % IJ SOLN: 1.0000 g | Freq: Once | INTRAMUSCULAR | Status: DC

## 2016-04-26 MED ORDER — ACETAMINOPHEN 325 MG PO TABS: 650.0000 mg | ORAL_TABLET | Freq: Four times a day (QID) | ORAL | Status: DC | PRN

## 2016-04-26 MED ORDER — ACETAMINOPHEN 650 MG RE SUPP: 650.0000 mg | Freq: Four times a day (QID) | RECTAL | Status: DC | PRN

## 2016-04-26 MED ORDER — ONDANSETRON HCL 4 MG/2ML IJ SOLN: 4.0000 mg | Freq: Four times a day (QID) | INTRAMUSCULAR | Status: DC | PRN

## 2016-04-26 MED ORDER — CEFTRIAXONE SODIUM 1 G IJ SOLR
1.0000 g | Freq: Once | INTRAMUSCULAR | Status: AC
  Administered 2016-04-26: 1 g via INTRAVENOUS
  Filled 2016-04-26: qty 10

## 2016-04-26 MED ORDER — POLYETHYLENE GLYCOL 3350 17 G PO PACK: 17.0000 g | PACK | Freq: Every day | ORAL | Status: DC | PRN

## 2016-04-26 MED ORDER — DEXTROSE 5 % IV SOLN
500.0000 mg | Freq: Every day | INTRAVENOUS | Status: DC
  Administered 2016-04-26 – 2016-04-30 (×5): 500 mg via INTRAVENOUS
  Filled 2016-04-26 (×5): qty 500

## 2016-04-26 MED ORDER — DEXTROSE 5 % IV SOLN
1.0000 g | INTRAVENOUS | Status: DC
  Administered 2016-04-27 – 2016-04-30 (×4): 1 g via INTRAVENOUS
  Filled 2016-04-26 (×4): qty 10

## 2016-04-26 MED ORDER — DEXTROSE 5 % IV SOLN: 500.0000 mg | Freq: Once | INTRAVENOUS | Status: DC

## 2016-04-26 MED ORDER — METHYLPREDNISOLONE SODIUM SUCC 125 MG IJ SOLR
125.0000 mg | Freq: Once | INTRAMUSCULAR | Status: AC
  Administered 2016-04-26: 125 mg via INTRAVENOUS
  Filled 2016-04-26: qty 2

## 2016-04-26 MED ORDER — IPRATROPIUM-ALBUTEROL 0.5-2.5 (3) MG/3ML IN SOLN
3.0000 mL | Freq: Four times a day (QID) | RESPIRATORY_TRACT | Status: DC | PRN
  Administered 2016-04-28: 3 mL via RESPIRATORY_TRACT
  Filled 2016-04-26: qty 3

## 2016-04-26 MED ORDER — SIMVASTATIN 20 MG PO TABS
20.0000 mg | ORAL_TABLET | Freq: Every day | ORAL | Status: DC
  Administered 2016-04-27 – 2016-04-30 (×4): 20 mg via ORAL
  Filled 2016-04-26 (×4): qty 1

## 2016-04-26 MED ORDER — ALBUTEROL SULFATE (2.5 MG/3ML) 0.083% IN NEBU: 5.0000 mg | INHALATION_SOLUTION | Freq: Once | RESPIRATORY_TRACT | Status: DC

## 2016-04-26 MED ORDER — METHYLPREDNISOLONE SODIUM SUCC 125 MG IJ SOLR
60.0000 mg | Freq: Two times a day (BID) | INTRAMUSCULAR | Status: DC
  Administered 2016-04-27 (×3): 60 mg via INTRAVENOUS
  Filled 2016-04-26 (×3): qty 2

## 2016-04-26 MED ORDER — DEXTROSE 5 % IV SOLN: 1.0000 g | INTRAVENOUS | Status: DC

## 2016-04-26 MED ORDER — WARFARIN SODIUM 2.5 MG PO TABS
2.5000 mg | ORAL_TABLET | Freq: Once | ORAL | Status: AC
Start: 2016-04-26 — End: 2016-04-26
  Administered 2016-04-26: 2.5 mg via ORAL
  Filled 2016-04-26: qty 1

## 2016-04-26 MED ORDER — ASPIRIN EC 81 MG PO TBEC
81.0000 mg | DELAYED_RELEASE_TABLET | Freq: Every day | ORAL | Status: DC
  Administered 2016-04-27 – 2016-04-30 (×4): 81 mg via ORAL
  Filled 2016-04-26 (×4): qty 1

## 2016-04-26 MED ORDER — MAGNESIUM SULFATE 2 GM/50ML IV SOLN
2.0000 g | Freq: Once | INTRAVENOUS | Status: AC
  Administered 2016-04-26: 2 g via INTRAVENOUS
  Filled 2016-04-26: qty 50

## 2016-04-26 MED ORDER — LEVALBUTEROL HCL 0.63 MG/3ML IN NEBU
0.6300 mg | INHALATION_SOLUTION | Freq: Four times a day (QID) | RESPIRATORY_TRACT | Status: DC
  Administered 2016-04-26 – 2016-04-28 (×8): 0.63 mg via RESPIRATORY_TRACT
  Filled 2016-04-26 (×9): qty 3

## 2016-04-26 MED ORDER — MAGNESIUM SULFATE 50 % IJ SOLN: 2.0000 g | Freq: Once | INTRAMUSCULAR | Status: DC

## 2016-04-26 MED ORDER — AMLODIPINE BESYLATE 10 MG PO TABS
10.0000 mg | ORAL_TABLET | Freq: Every day | ORAL | Status: DC
  Administered 2016-04-27 – 2016-04-30 (×4): 10 mg via ORAL
  Filled 2016-04-26 (×4): qty 1

## 2016-04-26 NOTE — ED Triage Notes (Signed)
Per EMS, Pt, from home, c/o SOB and productive cough x 1 week.  Denies pain.  Pt reports he feels similar to when he previously had PNA.  5mg  Albuterol given en route. Hx of COPD and significant cardiac Hx.

## 2016-04-26 NOTE — ED Notes (Signed)
WILL TRANSPORT PT TO 4W 1425-1. AAOX4. PT IN NO APPARENT DISTRESS OR PAIN. THE OPPORTUNITY TO ASK QUESTIONS WAS PROVIDED.

## 2016-04-26 NOTE — ED Notes (Signed)
Admission Provider at bedside. 

## 2016-04-26 NOTE — ED Notes (Signed)
Bed: [id number] Expected date:  Expected time:  Means of arrival:  Comments: EMS- 82yo M, SOB x 1 week

## 2016-04-26 NOTE — Progress Notes (Signed)
Pharmacy Antibiotic Note  Roberto Reilly is a 82 y.o. male admitted on 04/26/2016 with productive cough and SOB.  Pharmacy has been consulted for ceftriaxone dosing for CAP.  Plan: Ceftriaxone 1gm IV started in ED, continue same dose q24h Pharmacy will sign off as no renal adjustment is needed Please re-consult if needed  Height: 6' (182.9 cm) Weight: 218 lb (98.9 kg) IBW/kg (Calculated) : 77.6  Temp (24hrs), Avg:97.5 F (36.4 C), Min:97.5 F (36.4 C), Max:97.5 F (36.4 C)   Recent Labs Lab 04/26/16 1223  WBC 12.7*  CREATININE 1.10    Estimated Creatinine Clearance: 65.2 mL/min (by C-G formula based on SCr of 1.1 mg/dL).    Allergies  Allergen Reactions  . Prednisone Other (See Comments)    Makes pt jittery and nervous    Antimicrobials this admission: 12/5 ceftriaxone >>    Thank you for allowing pharmacy to be a part of this patient's care.  Arley PhenixEllen Jamal Haskin RPh 04/26/2016, 3:15 PM Pager [phone]

## 2016-04-26 NOTE — Progress Notes (Signed)
ANTICOAGULATION CONSULT NOTE - Initial Consult  Pharmacy Consult for warfarin Indication: atrial fibrillation  Allergies  Allergen Reactions  . Prednisone Other (See Comments)    Makes pt jittery and nervous    Patient Measurements: Height: 6' (182.9 cm) Weight: 218 lb (98.9 kg) IBW/kg (Calculated) : 77.6   Vital Signs: Temp: 98.1 F (36.7 C) (12/05 1716) Temp Source: Oral (12/05 1716) BP: 148/66 (12/05 1753) Pulse Rate: 80 (12/05 1753)  Labs:  Recent Labs  04/26/16 1223  HGB 14.1  HCT 43.4  PLT 228  LABPROT 28.9*  INR 2.66  CREATININE 1.10    Estimated Creatinine Clearance: 65.2 mL/min (by C-G formula based on SCr of 1.1 mg/dL).   Medical History: Past Medical History:  Diagnosis Date  . AAA (abdominal aortic aneurysm) (HCC)    4.2 X 4.8 cm 04/2013 CT; declined re-eval 02/25/15  . Anxiety   . CAD in native artery March 2007   Cath for dyspnea on exertion: 75% distal LM, 85% RI, 95% mid-distal Cx, in multiple RCA lesions with 95% distal. --> Referred for CABG; has declined further noninvasive evaluation in the absence of worsening symptoms  . COPD (chronic obstructive pulmonary disease) with emphysema (HCC) 05/23/2007   Hosp 5/29-6/01/12- COPD exacerbation ONOX 12/08/10- desaturated to less than 88% for over an hour, qualifying for home O2 during sleep    . Diverticulosis of colon with hemorrhage 05/24/2013  . Dyslipidemia, goal LDL below 70   . H/O echocardiogram March ; January 2015   a) Normal LV size and cavity appeared normal function. Grade 1 diastolic dysfunction. Limited study.; b) normal LV size and function with EF 60-65%. Aortic sclerosis without stenosis, mildly increased PA pressures of 44mmHg but normal IVC and RA/RV size  . History of non-ST Elevation MI (myocardial infarction) March 2007   Admitted with COPD exacerbation, given by CHF. Had some chest pain with positive troponins. Cath showed multivessel disease, referred for CABG  . History of  pneumonia   . Hypertension   . Hypoxia     chronic, on home O2  . Myocardial infarction   . Obesity (BMI 30-39.9)    One year ago weighed 265 pounds, (12/2012) -- now 234 pounds  . PAF (paroxysmal atrial fibrillation) (HCC)    Anticoagulated on warfarin. Stable -- post op  . Pneumonia    hx  . 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 Seasonal allergies   . Shortness of breath dyspnea       Assessment: 82 y.o. male with medical history significant of atrial fibrillation on chronic Coumadin plus chronic respiratory failure secondary COPD.  Pt complains of shortness of breath, wheezing and cough. Pharmacy consulted to resume warfarin.  -Home dose warfarin 5mg  on M,W,F & Sat, 2.5mg  on T,TH,Sun - last dose 12/5 - INR 2.66 (therapeutic) - H/H WNL - DI: azithromycin: may increase the hypoprothrombinemic effects of warfarin  Goal of Therapy:  INR 2-3   Plan:  Continue home regiment Warfarin 2.5mg  po x 1 tonight Daily INR  Arley Phenixllen Roby Spalla RPh 04/26/2016, 6:48 PM Pager [phone]

## 2016-04-26 NOTE — H&P (Signed)
History and Physical  Roberto ChristmasJames Minnich ZOX:[id number] DOB: 04/06/1934 DOA: 04/26/2016  Referring physician:  Terance HartKelly Gekas, ER PA PCP: Pearson GrippeJames Kim, MD  Outpatient Specialists: None Patient coming from: Home & is able to ambulate using a cane  Chief Complaint: Shortness of breath   HPI: Roberto Reilly is a 82 y.o. male with medical history significant of atrial fibrillation on chronic Coumadin plus chronic respiratory failure secondary COPD on 2 L nasal cannula oxygen at night who over the last few days as per had progressively worsening dyspnea on exertion plus productive cough with initially white and then green sputum hitting got to the point where he had a hard time catching his breath and had a lot of wheezing so he came into the emergency room for further evaluation.   ED Course: In the emergency room, patient was noted to be hypoxic requiring 4 L nasal cannula. This was at rest. When they try to ambulate him to the bathroom on 4 L, his oxygen saturations dropped to 70%. Patient was given doses of nebulizers and steroids and felt better although, still at 4 L. Lab work was noteworthy for an ABG the pH is 7.32, PCO2 of 63 and bicarbonate of 33. Chest x-ray was unrevealing, noting no infiltrate. Patient has a previous history of a lobectomy. Hospitals for call for further evaluation. BNP only minimally elevated at 247  Review of Systems: Patient seen in the emergency room . Pt complains of shortness of breath, wheezing and cough.   Pt denies any headaches, vision changes, dysphagia, chest pain, palpitations, abdominal pain, hematuria, dysuria, constipation, diarrhea, focal extremity numbness weakness or pain .  Review of systems are otherwise negative  Past Medical History:  Diagnosis Date  . AAA (abdominal aortic aneurysm) (HCC)    4.2 X 4.8 cm 04/2013 CT; declined re-eval 02/25/15  . Anxiety   . CAD in native artery March 2007   Cath for dyspnea on exertion: 75% distal LM, 85% RI, 95% mid-distal  Cx, in multiple RCA lesions with 95% distal. --> Referred for CABG; has declined further noninvasive evaluation in the absence of worsening symptoms  . COPD (chronic obstructive pulmonary disease) with emphysema (HCC) 05/23/2007   Hosp 5/29-6/01/12- COPD exacerbation ONOX 12/08/10- desaturated to less than 88% for over an hour, qualifying for home O2 during sleep    . Diverticulosis of colon with hemorrhage 05/24/2013  . Dyslipidemia, goal LDL below 70   . H/O echocardiogram March ; January 2015   a) Normal LV size and cavity appeared normal function. Grade 1 diastolic dysfunction. Limited study.; b) normal LV size and function with EF 60-65%. Aortic sclerosis without stenosis, mildly increased PA pressures of 44mmHg but normal IVC and RA/RV size  . History of non-ST Elevation MI (myocardial infarction) March 2007   Admitted with COPD exacerbation, given by CHF. Had some chest pain with positive troponins. Cath showed multivessel disease, referred for CABG  . History of pneumonia   . Hypertension   . Hypoxia     chronic, on home O2  . Myocardial infarction   . Obesity (BMI 30-39.9)    One year ago weighed 265 pounds, (12/2012) -- now 234 pounds  . PAF (paroxysmal atrial fibrillation) (HCC)    Anticoagulated on warfarin. Stable -- post op  . Pneumonia    hx  . 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 Seasonal allergies   . Shortness of breath dyspnea    Past Surgical History:  Procedure Laterality Date  . ACHILLES TENDON LENGTHENING Right 10/08/2015   Procedure: ACHILLES TENDON LENGTHENING;  Surgeon: Toni Arthurs, MD;  Location: [facility] OR;  Service: Orthopedics;  Laterality: Right;  . AMPUTATION Right 10/08/2015   Procedure: RIGHT FOOT TRANSMET AMPUTATION;  Surgeon: Toni Arthurs, MD;  Location: [facility] OR;  Service: Orthopedics;  Laterality: Right;  . CARDIAC CATHETERIZATION  03 28 2007   NORMAL LV FUNCTION/ ABDOMINAL AORTA STENOSIS,75%-85%. RIGHT FEMORAL ARTERY :CATHETERS USED A   4-FRENCH WITH A 4-FRENCH SHEATH  . CATARACT EXTRACTION     Laser  . COLONOSCOPY N/A 05/24/2013   Procedure: COLONOSCOPY;  Surgeon: Iva Boop, MD;  Location: [facility];  Service: Endoscopy;  Laterality: N/A;  . CORONARY ARTERY BYPASS GRAFT    . RLL resection for Hamartoma     lungs  . RUL for hamartoma     lung    Social History:  reports that he quit smoking about 17 years ago. His smoking use included Cigarettes. He has a 110.00 pack-year smoking history. He has never used smokeless tobacco. He reports that he does not drink alcohol or use drugs.  He lives at home by himself. His wife passed away 6 months ago. Patient ambulance using a walker   Allergies  Allergen Reactions  . Prednisone Other (See Comments)    Makes pt jittery and nervous    Family History  Problem Relation Age of Onset  . Heart attack Mother   . Heart attack Father       Prior to Admission medications   Medication Sig Start Date End Date Taking? Authorizing Provider  amLODipine (NORVASC) 10 MG tablet Take 1 tablet (10 mg total) by mouth daily. 05/01/13  Yes Dorothea Ogle, MD  aspirin EC 81 MG tablet Take 81 mg by mouth every morning.    Yes Historical Provider, MD  budesonide-formoterol (SYMBICORT) 160-4.5 MCG/ACT inhaler Inhale 2 puffs into the lungs 2 (two) times daily. Patient taking differently: Inhale 2 puffs into the lungs 2 (two) times daily as needed (sob, wheezing).  03/11/14  Yes Waymon Budge, MD  cholecalciferol (VITAMIN D) 1000 UNITS tablet Take 1,000 Units by mouth every morning.    Yes Historical Provider, MD  furosemide (LASIX) 40 MG tablet TAKE ONE TABLET BY MOUTH EVERY MORNING Patient taking differently: TAKE 40  MG BY MOUTH EVERY MORNING 02/08/16  Yes Marykay Lex, MD  ipratropium-albuterol (DUONEB) 0.5-2.5 (3) MG/3ML SOLN Take 3 mLs by nebulization every 6 (six) hours as needed (wheezing and shortness of breath).    Yes Historical Provider, MD  KLOR-CON M20 20 MEQ tablet TAKE 1  TABLET (20 MEQ TOTAL) BY MOUTH DAILY. 12/29/15  Yes Marykay Lex, MD  OXYGEN Inhale 2 L into the lungs as needed (shortness of breath).    Yes Historical Provider, MD  simvastatin (ZOCOR) 40 MG tablet Take 20 mg by mouth every morning.    Yes Historical Provider, MD  warfarin (COUMADIN) 2.5 MG tablet Take 1-2 tablets by mouth daily as directed by coumadin clinic Patient taking differently: Take 2.5-5 mg by mouth daily. Take 5 mg on MWF& SAT and take 2.5 mg on all other days 03/04/16  Yes Marykay Lex, MD    Physical Exam: BP 148/66 (BP Location: Left Arm)   Pulse 80   Temp 98.1 F (36.7 C) (Oral)   Resp 23   Ht 6' (1.829 m)   Wt 98.9 kg (218 lb)   SpO2 95%   BMI 29.57 kg/m  General:  Alert and oriented 3, no acute distress  Eyes: Sclera nonicteric, ocular movements are intact  ENT: Normocephalic, atraumatic, mucous membranes are dry Neck: Supple, no JVD  Cardiovascular: Irregular rhythm, rate controlled Respiratory: Decreased breath sounds throughout, with bilateral wheezing. Scattered rhonchi  Abdomen: Soft, nontender, nondistended, positive bowel sounds  Skin: No skin breaks, tears or lesions  Musculoskeletal:   mild clubbing, no cyanosis or edema  Psychiatric: Patient is appropriate, no evidence of psychoses Neurologic: no focal deficits            Labs on Admission:  Basic Metabolic Panel:  Recent Labs Lab 04/26/16 1223  NA 138  K 4.8  CL 98*  CO2 31  GLUCOSE 152*  BUN 24*  CREATININE 1.10  CALCIUM 8.8*   Liver Function Tests: No results for input(s): AST, ALT, ALKPHOS, BILITOT, PROT, ALBUMIN in the last 168 hours. No results for input(s): LIPASE, AMYLASE in the last 168 hours. No results for input(s): AMMONIA in the last 168 hours. CBC:  Recent Labs Lab 04/26/16 1223  WBC 12.7*  HGB 14.1  HCT 43.4  MCV 89.3  PLT 228   Cardiac Enzymes: No results for input(s): CKTOTAL, CKMB, CKMBINDEX, TROPONINI in the last 168 hours.  BNP (last 3  results)  Recent Labs  11/01/15 1846 11/02/15 0453 04/26/16 1232  BNP 399.0* 710.5* 247.3*    ProBNP (last 3 results) No results for input(s): PROBNP in the last 8760 hours.  CBG: No results for input(s): GLUCAP in the last 168 hours.  Radiological Exams on Admission: Dg Chest 2 View  Result Date: 04/26/2016 CLINICAL DATA:  Shortness of breath, productive cough for 1 week. EXAM: CHEST  2 VIEW COMPARISON:  11/03/2015 FINDINGS: Prior CABG. Volume loss on the right with probable scarring at the right base. Postoperative changes on the right. No confluent airspace opacities. Heart is borderline in size. No acute bony abnormality. IMPRESSION: Postoperative changes and volume loss on the right with stable chronic changes of the right lung base. No acute findings. Electronically Signed   By: Charlett Nose M.D.   On: 04/26/2016 13:09    EKG: Independently reviewed. Atrial fibrillation    Assessment/Plan Present on Admission: . Acute on chronic respiratory failure with hypoxia and hypercarbia secondary to COPD exacerbation [facility]): Increased from baseline of 2 L nasal cannula at night to 4 L continuous. Continue to monitor. Nebulizers plus steroids plus antibiotics and additional oxygen. Recheck ABG in the morning to ensure that there is no CO2 trapping. . Obesity (BMI 30-39.9): Patient meets criteria with BMI greater than 30 . CAD in native artery - LM, RCA, RI & Cx disease --> CABG x 4 (LIMA-LAD, SVG-OM/RI, SVG-rPDA-rPL): Stable, no evidence of acute ischemia . Paroxysmal atrial fibrillation (HCC); CHA2DS2-VASc Score = 4. On Warfarin. Pharmacy to dose. Rate controlled. Use Xopenex instead of albuterol.  . Essential hypertension: Stable, continue home medications . AAA (abdominal aortic aneurysm) without rupture seen on CT scan: Stable.    DVT prophylaxis: On chronic Coumadin  Code Status: DO NOT RESUSCITATE as confirmed by the patient   Family Communication: No family present, left  message for daughter  Disposition Plan: Anticipate discharge home after several days, once his oxygen is back to baseline   Consults called: None   Admission status: Patient will need several days to improve his COPD, well passed to midnight     Hollice Espy MD [facility] Pager [phone]  If 7PM-7AM, please contact night-coverage [email or web address] Password [facility]  04/26/2016,  6:17 PM

## 2016-04-26 NOTE — ED Provider Notes (Signed)
[facility]-EMERGENCY DEPT Provider Note   CSN: [id number] Arrival date & time: 04/26/16  1153     History   Chief Complaint Chief Complaint  Patient presents with  . Shortness of Breath    HPI Roberto Reilly is a 82 y.o. male who presents with SOB. PMH significant for CAD s/p CABG in 2007, EF 55-60%, PAF on warfarin, severe COPD on 2L of O2 at home at night only, right lower lobectomy due to hamartoma, obesity, PAD, AAA, anxiety. He reports worsening SOB and productive cough for the past week. He reports associated fatigue, generalized malaise, chills and productive cough. He thinks it feels similar to when he had PNA in the past. He was hypoxic with sats as low as 55%. EMS gave him 5mg  Albuterol. Dr. Herbie BaltimoreHarding is his cardiologist. Denies fever, chest pain, leg swelling, abdominal pain, N/V.  HPI  Past Medical History:  Diagnosis Date  . AAA (abdominal aortic aneurysm) (HCC)    4.2 X 4.8 cm 04/2013 CT; declined re-eval 02/25/15  . Anxiety   . CAD in native artery March 2007   Cath for dyspnea on exertion: 75% distal LM, 85% RI, 95% mid-distal Cx, in multiple RCA lesions with 95% distal. --> Referred for CABG; has declined further noninvasive evaluation in the absence of worsening symptoms  . COPD (chronic obstructive pulmonary disease) with emphysema (HCC) 05/23/2007   Hosp 5/29-6/01/12- COPD exacerbation ONOX 12/08/10- desaturated to less than 88% for over an hour, qualifying for home O2 during sleep    . Diverticulosis of colon with hemorrhage 05/24/2013  . Dyslipidemia, goal LDL below 70   . H/O echocardiogram March ; January 2015   a) Normal LV size and cavity appeared normal function. Grade 1 diastolic dysfunction. Limited study.; b) normal LV size and function with EF 60-65%. Aortic sclerosis without stenosis, mildly increased PA pressures of 44mmHg but normal IVC and RA/RV size  . History of non-ST Elevation MI (myocardial infarction) March 2007   Admitted with COPD exacerbation, given by  CHF. Had some chest pain with positive troponins. Cath showed multivessel disease, referred for CABG  . History of pneumonia   . Hypertension   . Hypoxia     chronic, on home O2  . Myocardial infarction   . Obesity (BMI 30-39.9)    One year ago weighed 265 pounds, (12/2012) -- now 234 pounds  . PAF (paroxysmal atrial fibrillation) (HCC)    Anticoagulated on warfarin. Stable -- post op  . Pneumonia    hx  . 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 Seasonal allergies   . Shortness of breath dyspnea     Patient Active Problem List   Diagnosis Date Noted  . Chronic osteomyelitis of right foot (HCC) 02/05/2016  . Acute respiratory failure with hypoxia (HCC)   . Acute on chronic respiratory failure (HCC) 11/03/2015  . Abnormal TSH 11/03/2015  . Pleural effusion 11/01/2015  . Elevated troponin 11/01/2015  . Acute renal failure (ARF) (HCC) 11/01/2015  . Sepsis (HCC) 11/01/2015  . Pneumonia 11/01/2015  . COPD exacerbation (HCC) 11/01/2015  . Encounter for long-term (current) use of other medications 01/07/2014  . Nasal sinus congestion 01/07/2014  . Diverticulosis of colon with hemorrhage 05/24/2013  . Internal hemorrhoids 05/24/2013  . AAA (abdominal aortic aneurysm) without rupture seen on CT scan 05/22/2013  . Rectal bleeding 05/21/2013  . Anemia 05/20/2013  . Generalized anxiety disorder 05/09/2013  . Auditory hallucination 05/09/2013  . COPD mixed  type (HCC) 05/07/2013  . COPD with acute exacerbation (HCC) 05/07/2013  . Chest discomfort 05/07/2013  . Hx of scabies 04/29/2013  . Essential hypertension 04/29/2013  . Obesity (BMI 30-39.9) 01/18/2013  . Paroxysmal atrial fibrillation (HCC); CHA2DS2-VASc Score = 4. On Warfarin   . Dyslipidemia, goal LDL below 70   . Long term (current) use of anticoagulants 08/09/2012  . Reactive depression (situational) 09/06/2011  . CAD 05/23/2007  . CAD in native artery - LM, RCA, RI & Cx disease --> CABG x 4  (LIMA-LAD, SVG-OM/RI, SVG-rPDA-rPL) 07/21/2005    Class: History of  . S/P CABG x 4 07/21/2005    Past Surgical History:  Procedure Laterality Date  . ACHILLES TENDON LENGTHENING Right 10/08/2015   Procedure: ACHILLES TENDON LENGTHENING;  Surgeon: Toni Arthurs, MD;  Location: [facility] OR;  Service: Orthopedics;  Laterality: Right;  . AMPUTATION Right 10/08/2015   Procedure: RIGHT FOOT TRANSMET AMPUTATION;  Surgeon: Toni Arthurs, MD;  Location: [facility] OR;  Service: Orthopedics;  Laterality: Right;  . CARDIAC CATHETERIZATION  03 28 2007   NORMAL LV FUNCTION/ ABDOMINAL AORTA STENOSIS,75%-85%. RIGHT FEMORAL ARTERY :CATHETERS USED A  4-FRENCH WITH A 4-FRENCH SHEATH  . CATARACT EXTRACTION     Laser  . COLONOSCOPY N/A 05/24/2013   Procedure: COLONOSCOPY;  Surgeon: Iva Boop, MD;  Location: [facility] ENDOSCOPY;  Service: Endoscopy;  Laterality: N/A;  . CORONARY ARTERY BYPASS GRAFT    . RLL resection for Hamartoma     lungs  . RUL for hamartoma     lung       Home Medications    Prior to Admission medications   Medication Sig Start Date End Date Taking? Authorizing Provider  amLODipine (NORVASC) 10 MG tablet Take 1 tablet (10 mg total) by mouth daily. 05/01/13   Dorothea Ogle, MD  aspirin EC 81 MG tablet Take 81 mg by mouth daily.    Historical Provider, MD  budesonide-formoterol (SYMBICORT) 160-4.5 MCG/ACT inhaler Inhale 2 puffs into the lungs 2 (two) times daily. 03/11/14   Waymon Budge, MD  cholecalciferol (VITAMIN D) 1000 UNITS tablet Take 1,000 Units by mouth daily.    Historical Provider, MD  furosemide (LASIX) 40 MG tablet TAKE ONE TABLET BY MOUTH EVERY MORNING 02/08/16   Marykay Lex, MD  ipratropium-albuterol (DUONEB) 0.5-2.5 (3) MG/3ML SOLN Take 3 mLs by nebulization 4 (four) times daily.     Historical Provider, MD  KLOR-CON M20 20 MEQ tablet TAKE 1 TABLET (20 MEQ TOTAL) BY MOUTH DAILY. 12/29/15   Marykay Lex, MD  OXYGEN Inhale 2 L into the lungs as needed (shortness of breath).      Historical Provider, MD  simvastatin (ZOCOR) 40 MG tablet Take 20 mg by mouth daily.    Historical Provider, MD  warfarin (COUMADIN) 2.5 MG tablet Take 1-2 tablets by mouth daily as directed by coumadin clinic 03/04/16   Marykay Lex, MD    Family History Family History  Problem Relation Age of Onset  . Heart attack Mother   . Heart attack Father     Social History Social History  Substance Use Topics  . Smoking status: Former Smoker    Packs/day: 2.00    Years: 55.00    Types: Cigarettes    Quit date: 05/23/1998  . Smokeless tobacco: Never Used  . Alcohol use No     Allergies   Prednisone   Review of Systems Review of Systems  Constitutional: Positive for chills and fatigue. Negative for fever.  HENT: Negative for congestion and sore throat.   Respiratory: Positive for cough and shortness of breath. Negative for chest tightness and wheezing.   Cardiovascular: Negative for chest pain, palpitations and leg swelling.  Gastrointestinal: Negative for abdominal pain, nausea and vomiting.  Neurological: Positive for weakness (generalized).  All other systems reviewed and are negative.    Physical Exam Updated Vital Signs BP 159/66 (BP Location: Left Arm)   Pulse 83   Temp 97.5 F (36.4 C) (Oral)   Resp 20   Ht 6' (1.829 m)   Wt 98.9 kg   SpO2 100%   BMI 29.57 kg/m   Physical Exam  Constitutional: He is oriented to person, place, and time. He appears well-developed and well-nourished. No distress.  Obese, pleasant.   HENT:  Head: Normocephalic and atraumatic.  Eyes: Conjunctivae are normal. Pupils are equal, round, and reactive to light. Right eye exhibits no discharge. Left eye exhibits no discharge. No scleral icterus.  Neck: Normal range of motion.  Cardiovascular: Normal rate and regular rhythm.  Exam reveals no gallop and no friction rub.   No murmur heard. Pulmonary/Chest: Tachypnea noted. No respiratory distress. He has wheezes. He has no rales. He  exhibits no tenderness.  Increased work of breathing noted. When patient talks patient becomes more hypoxic. Diffuse inspiratory and expiratory wheezes. Well healed midline scar from CABG  Abdominal: He exhibits no distension.  Neurological: He is alert and oriented to person, place, and time.  Skin: Skin is warm and dry.  Psychiatric: He has a normal mood and affect. His behavior is normal.  Nursing note and vitals reviewed.    ED Treatments / Results  Labs (all labs ordered are listed, but only abnormal results are displayed) Labs Reviewed  BASIC METABOLIC PANEL - Abnormal; Notable for the following:       Result Value   Chloride 98 (*)    Glucose, Bld 152 (*)    BUN 24 (*)    Calcium 8.8 (*)    All other components within normal limits  CBC - Abnormal; Notable for the following:    WBC 12.7 (*)    All other components within normal limits  PROTIME-INR - Abnormal; Notable for the following:    Prothrombin Time 28.9 (*)    All other components within normal limits  BLOOD GAS, ARTERIAL - Abnormal; Notable for the following:    pH, Arterial 7.328 (*)    pCO2 arterial 63.7 (*)    pO2, Arterial 68.7 (*)    Bicarbonate 32.5 (*)    Acid-Base Excess 5.0 (*)    All other components within normal limits  BRAIN NATRIURETIC PEPTIDE  I-STAT TROPOININ, ED    EKG  EKG Interpretation  Date/Time:  Tuesday April 26 2016 12:18:04 EST Ventricular Rate:  74 PR Interval:    QRS Duration: 147 QT Interval:  403 QTC Calculation: 448 R Axis:   109 Text Interpretation:  Atrial fibrillation RBBB and LPFB No significant change since last tracing Confirmed by [facility] MD, PEDRO ([id number]) on 04/26/2016 1:01:12 PM       Radiology Dg Chest 2 View  Result Date: 04/26/2016 CLINICAL DATA:  Shortness of breath, productive cough for 1 week. EXAM: CHEST  2 VIEW COMPARISON:  11/03/2015 FINDINGS: Prior CABG. Volume loss on the right with probable scarring at the right base. Postoperative changes on  the right. No confluent airspace opacities. Heart is borderline in size. No acute bony abnormality. IMPRESSION: Postoperative changes and volume loss on the right  with stable chronic changes of the right lung base. No acute findings. Electronically Signed   By: Charlett NoseKevin  Dover M.D.   On: 04/26/2016 13:09    Procedures Procedures (including critical care time)  Medications Ordered in ED Medications  cefTRIAXone (ROCEPHIN) 1 g in dextrose 5 % 50 mL IVPB (1 g Intravenous New Bag/Given 04/26/16 1450)  magnesium sulfate IVPB 2 g 50 mL (not administered)  cefTRIAXone (ROCEPHIN) 1 g in dextrose 5 % 50 mL IVPB (not administered)  ipratropium-albuterol (DUONEB) 0.5-2.5 (3) MG/3ML nebulizer solution 3 mL (3 mLs Nebulization Given 04/26/16 1258)  methylPREDNISolone sodium succinate (SOLU-MEDROL) 125 mg/2 mL injection 125 mg (125 mg Intravenous Given 04/26/16 1411)  albuterol (PROVENTIL,VENTOLIN) solution continuous neb (10 mg/hr Nebulization Given 04/26/16 1505)     Initial Impression / Assessment and Plan / ED Course  I have reviewed the triage vital signs and the nursing notes.  Pertinent labs & imaging results that were available during my care of the patient were reviewed by me and considered in my medical decision making (see chart for details).  Clinical Course    82 year old male presents with COPD exacerbation. He is markedly hypoxic with exertion dropping as low as 55% on O2 via [place] when walking to the bathroom in the ED. He is requiring 3-4L O2 at rest and becomes hypoxic with talking. Steroids and breathing tx given. CXR remarkable for post-op changes with out any acute findings. Lung exam remarkable for diffuse wheezing. ABG shows respiratory acidosis. CBC remarkable for leukocytosis of 12.7 which is baseline. BMP remarkable for mild hypochloremia (98), BUN 24, glucose 152. BNP is 247.3. EKG shows rate controlled A.fib. Troponin is 0.01  Shared visit with Dr. Eudelia Bunchardama. Patient is still wheezing  diffusely after Duoneb. Continuous ordered as well as Mg sulfate. Rocephin ordered. Spoke with Dr. Rito EhrlichKrishnan with hospitalist service who will admit. Appreciate assistance.  Final Clinical Impressions(s) / ED Diagnoses   Final diagnoses:  COPD exacerbation [facility](HCC)    New Prescriptions New Prescriptions   No medications on file     Bethel BornKelly Marie Jeson Camacho, PA-C 04/26/16 1643    Nira ConnPedro Eduardo Cardama, MD 04/28/16 1149

## 2016-04-26 NOTE — ED Notes (Signed)
Pt had SOB upon returning from restroom. Pt ambulated to restroom by himself.

## 2016-04-26 NOTE — ED Notes (Signed)
ED Provider at bedside. 

## 2016-04-26 NOTE — ED Notes (Signed)
RESPIRATORY NOTIFIED. 

## 2016-04-27 DIAGNOSIS — I10 Essential (primary) hypertension: Secondary | ICD-10-CM

## 2016-04-27 DIAGNOSIS — I480 Paroxysmal atrial fibrillation: Secondary | ICD-10-CM

## 2016-04-27 DIAGNOSIS — I2510 Atherosclerotic heart disease of native coronary artery without angina pectoris: Secondary | ICD-10-CM

## 2016-04-27 DIAGNOSIS — J441 Chronic obstructive pulmonary disease with (acute) exacerbation: Principal | ICD-10-CM

## 2016-04-27 LAB — PROTIME-INR
INR: 2.55
PROTHROMBIN TIME: 27.9 s — AB (ref 11.4–15.2)

## 2016-04-27 LAB — BLOOD GAS, ARTERIAL
Acid-Base Excess: 5.4 mmol/L — ABNORMAL HIGH (ref 0.0–2.0)
Bicarbonate: 30.9 mmol/L — ABNORMAL HIGH (ref 20.0–28.0)
DRAWN BY: 422461
O2 Content: 2 L/min
O2 Saturation: 89.5 %
PH ART: 7.398 (ref 7.350–7.450)
Patient temperature: 99.1
pCO2 arterial: 51.4 mmHg — ABNORMAL HIGH (ref 32.0–48.0)
pO2, Arterial: 58.4 mmHg — ABNORMAL LOW (ref 83.0–108.0)

## 2016-04-27 MED ORDER — WARFARIN SODIUM 5 MG PO TABS
5.0000 mg | ORAL_TABLET | Freq: Once | ORAL | Status: AC
  Administered 2016-04-27: 5 mg via ORAL
  Filled 2016-04-27: qty 1

## 2016-04-27 MED ORDER — PANTOPRAZOLE SODIUM 40 MG PO TBEC
40.0000 mg | DELAYED_RELEASE_TABLET | Freq: Every day | ORAL | Status: DC
  Administered 2016-04-27 – 2016-04-30 (×4): 40 mg via ORAL
  Filled 2016-04-27 (×4): qty 1

## 2016-04-27 NOTE — Progress Notes (Signed)
ANTICOAGULATION CONSULT NOTE - Follow-Up Consult  Pharmacy Consult for warfarin Indication: atrial fibrillation  Allergies  Allergen Reactions  . Prednisone Other (See Comments)    Makes pt jittery and nervous    Patient Measurements: Height: 6' (182.9 cm) Weight: 218 lb (98.9 kg) IBW/kg (Calculated) : 77.6   Vital Signs: Temp: 99.3 F (37.4 C) (12/06 1411) Temp Source: Oral (12/06 1411) BP: 155/64 (12/06 1411) Pulse Rate: 78 (12/06 1411)  Labs:  Recent Labs  04/26/16 1223 04/27/16 0349  HGB 14.1  --   HCT 43.4  --   PLT 228  --   LABPROT 28.9* 27.9*  INR 2.66 2.55  CREATININE 1.10  --     Estimated Creatinine Clearance: 65.2 mL/min (by C-G formula based on SCr of 1.1 mg/dL).   Medical History: Past Medical History:  Diagnosis Date  . AAA (abdominal aortic aneurysm) (HCC)    4.2 X 4.8 cm 04/2013 CT; declined re-eval 02/25/15  . Anxiety   . CAD in native artery March 2007   Cath for dyspnea on exertion: 75% distal LM, 85% RI, 95% mid-distal Cx, in multiple RCA lesions with 95% distal. --> Referred for CABG; has declined further noninvasive evaluation in the absence of worsening symptoms  . COPD (chronic obstructive pulmonary disease) with emphysema (HCC) 05/23/2007   Hosp 5/29-6/01/12- COPD exacerbation ONOX 12/08/10- desaturated to less than 88% for over an hour, qualifying for home O2 during sleep    . Diverticulosis of colon with hemorrhage 05/24/2013  . Dyslipidemia, goal LDL below 70   . H/O echocardiogram March ; January 2015   a) Normal LV size and cavity appeared normal function. Grade 1 diastolic dysfunction. Limited study.; b) normal LV size and function with EF 60-65%. Aortic sclerosis without stenosis, mildly increased PA pressures of 44mmHg but normal IVC and RA/RV size  . History of non-ST Elevation MI (myocardial infarction) March 2007   Admitted with COPD exacerbation, given by CHF. Had some chest pain with positive troponins. Cath showed multivessel  disease, referred for CABG  . History of pneumonia   . Hypertension   . Hypoxia     chronic, on home O2  . Myocardial infarction   . Obesity (BMI 30-39.9)    One year ago weighed 265 pounds, (12/2012) -- now 234 pounds  . PAF (paroxysmal atrial fibrillation) (HCC)    Anticoagulated on warfarin. Stable -- post op  . Pneumonia    hx  . 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 Seasonal allergies   . Shortness of breath dyspnea       Assessment: 82 y.o. male with PMH significant for atrial fibrillation on chronic warfarin [facility] breath, wheezing and cough, admitted for COPD exacerbation. Pharmacy consulted to dose warfarin while patient in the hospital.  -Home dose warfarin 5mg  on M,W,F & Sat, 2.5mg  on T,TH,Sun - INR 2.55 today, therapeutic - H/H, Pltc WNL - DDI: broad spectrum antibiotics may increase the hypoprothrombinemic effects of warfarin  Goal of Therapy:  INR 2-3   Plan:  Warfarin 5mg  PO x 1 tonight as per home regimen Daily PT/INR Monitor for s/sx of bleeding   Greer PickerelJigna Shamarie Call, PharmD, BCPS Pager: [phone] 04/27/2016 3:27 PM

## 2016-04-27 NOTE — Progress Notes (Signed)
PROGRESS NOTE                                                                                                                                                                                                             Patient Demographics:    Roberto Reilly, is a 82 y.o. male, DOB - [birth year], AVW:[id number]  Admit date - 04/26/2016   Admitting Physician Roberto Espy, MD  Outpatient Primary MD for the patient is Roberto Grippe, MD  LOS - 1  Chief Complaint  Patient presents with  . Shortness of Breath       Brief Narrative  82 year old male with history of A. fib on warfarin, chronic respiratory failure on 2 L nasal cannula at baseline, presents with shortness of breath, wheezing , admitted for COPD exacerbation treatment   Subjective:    Roberto Reilly today has, No headache, No chest pain, No abdominal pain - No Nausea,Denies any chest pain, reports dyspnea at baseline, reports cough is improving, today is nonproductive.  Assessment  & Plan :    Principal Problem:   COPD exacerbation (HCC) Active Problems:   Chronic anticoagulation   Obesity (BMI 30-39.9)   CAD in native artery - LM, RCA, RI & Cx disease --> CABG x 4 (LIMA-LAD, SVG-OM/RI, SVG-rPDA-rPL)   Paroxysmal atrial fibrillation (HCC); CHA2DS2-VASc Score = 4. On Warfarin   Essential hypertension   AAA (abdominal aortic aneurysm) without rupture seen on CT scan  Acute on chronic hypoxic , and hypercarbic respiratory failure - Secondary to COPD exacerbation, on baseline 2 L nasal cannula, requiring 4 L on admission, with evidence of CO2 retention. - Appears to be improving on steroids and nebulizer treatment  COPD exacerbation - Continue with IV steroids, nebulizer treatment, given productive sputum will continue with IV antibiotics.  Obesity (BMI 30-39.9): - Patient meets criteria with BMI greater than 30  CAD in native artery  - LM, RCA, RI & Cx disease --> CABG x 4 (LIMA-LAD,  SVG-OM/RI, SVG-rPDA-rPL): Stable, no evidence of acute ischemia  Paroxysmal atrial fibrillation (HCC); - CHA2DS2-VASc Score = 4. On Warfarin. Pharmacy to dose. Rate controlled. Use Xopenex instead of albuterol.   Essential hypertension:  - Stable, continue home medications  AAA (abdominal aortic aneurysm) without rupture seen on CT scan:  - Stable.   Code Status : DNR  Family Communication  :  None at bedside  Disposition Plan  : home when stable  Consults  :  none  Procedures  : none  DVT Prophylaxis  :  On warfarin  Lab Results  Component Value Date   PLT 228 04/26/2016    Antibiotics  :   Anti-infectives    Start     Dose/Rate Route Frequency Ordered Stop   04/27/16 1400  cefTRIAXone (ROCEPHIN) 1 g in dextrose 5 % 50 mL IVPB  Status:  Discontinued     1 g 100 mL/hr over 30 Minutes Intravenous Every 24 hours 04/26/16 1516 04/26/16 1526   04/27/16 1400  cefTRIAXone (ROCEPHIN) 1 g in dextrose 5 % 50 mL IVPB     1 g 100 mL/hr over 30 Minutes Intravenous Every 24 hours 04/26/16 1826     04/26/16 1900  azithromycin (ZITHROMAX) 500 mg in dextrose 5 % 250 mL IVPB     500 mg 250 mL/hr over 60 Minutes Intravenous Daily-1800 04/26/16 1826     04/26/16 1445  azithromycin (ZITHROMAX) 500 mg in dextrose 5 % 250 mL IVPB  Status:  Discontinued     500 mg 250 mL/hr over 60 Minutes Intravenous  Once 04/26/16 1433 04/26/16 1434   04/26/16 1445  cefTRIAXone (ROCEPHIN) 1 g in dextrose 5 % 50 mL IVPB     1 g 100 mL/hr over 30 Minutes Intravenous  Once 04/26/16 1435 04/26/16 1532        Objective:   Vitals:   04/26/16 2055 04/26/16 2224 04/27/16 0511 04/27/16 0848  BP:  (!) 142/62 133/68   Pulse: 79 70 68   Resp: 18 18    Temp:  99.1 F (37.3 C) 98.5 F (36.9 C)   TempSrc:  Oral Oral   SpO2: 92% 96% 92% 92%  Weight:      Height:        Wt Readings from Last 3 Encounters:  04/26/16 98.9 kg (218 lb)  03/22/16 101.2 kg (223 lb)  02/05/16 96.2 kg (212 lb)      Intake/Output Summary (Last 24 hours) at 04/27/16 1329 Last data filed at 04/27/16 1209  Gross per 24 hour  Intake              830 ml  Output             1875 ml  Net            -1045 ml     Physical Exam  Awake Alert, Oriented X 3, No new F.N deficits, Normal affect Roberto Reilly,PERRAL Supple Neck,No JVD, Symmetrical Chest wall movement, Diminished air movement bilaterally, diffuse wheezing bilaterally RRR,No Gallops,Rubs or new Murmurs, No Parasternal Heave +ve B.Sounds, Abd Soft, No tenderness, No organomegaly appriciated, No rebound - guarding or rigidity. No Cyanosis, Clubbing , +1 edema B/L.   Data Review:    CBC  Recent Labs Lab 04/26/16 1223  WBC 12.7*  HGB 14.1  HCT 43.4  PLT 228  MCV 89.3  MCH 29.0  MCHC 32.5  RDW 15.5    Chemistries   Recent Labs Lab 04/26/16 1223  NA 138  K 4.8  CL 98*  CO2 31  GLUCOSE 152*  BUN 24*  CREATININE 1.10  CALCIUM 8.8*   ------------------------------------------------------------------------------------------------------------------ No results for input(s): CHOL, HDL, LDLCALC, TRIG, CHOLHDL, LDLDIRECT in the last 72 hours.  Lab Results  Component Value Date   HGBA1C 5.6 11/02/2015   ------------------------------------------------------------------------------------------------------------------ No results for input(s): TSH, T4TOTAL, T3FREE, THYROIDAB in the last 72 hours.  Invalid input(s): FREET3 ------------------------------------------------------------------------------------------------------------------ No results for input(s): VITAMINB12, FOLATE, FERRITIN, TIBC, IRON, RETICCTPCT in the last 72 hours.  Coagulation profile  Recent Labs Lab 04/26/16 1223 04/27/16 0349  INR 2.66 2.55    No results for input(s): DDIMER in the last 72 hours.  Cardiac Enzymes No results for input(s): CKMB, TROPONINI, MYOGLOBIN in the last 168 hours.  Invalid input(s):  CK ------------------------------------------------------------------------------------------------------------------    Component Value Date/Time   BNP 247.3 (H) 04/26/2016 1232    Inpatient Medications  Scheduled Meds: . amLODipine  10 mg Oral Daily  . aspirin EC  81 mg Oral Daily  . azithromycin  500 mg Intravenous q1800  . cefTRIAXone (ROCEPHIN)  IV  1 g Intravenous Q24H  . furosemide  40 mg Oral q morning - 10a  . levalbuterol  0.63 mg Nebulization Q6H  . methylPREDNISolone (SOLU-MEDROL) injection  60 mg Intravenous Q12H  . potassium chloride SA  20 mEq Oral Daily  . simvastatin  20 mg Oral q1800  . Warfarin - Pharmacist Dosing Inpatient   Does not apply q1800   Continuous Infusions: PRN Meds:.acetaminophen **OR** acetaminophen, ipratropium-albuterol, ondansetron **OR** ondansetron (ZOFRAN) IV, polyethylene glycol  Micro Results No results found for this or any previous visit (from the past 240 hour(s)).  Radiology Reports Dg Chest 2 View  Result Date: 04/26/2016 CLINICAL DATA:  Shortness of breath, productive cough for 1 week. EXAM: CHEST  2 VIEW COMPARISON:  11/03/2015 FINDINGS: Prior CABG. Volume loss on the right with probable scarring at the right base. Postoperative changes on the right. No confluent airspace opacities. Heart is borderline in size. No acute bony abnormality. IMPRESSION: Postoperative changes and volume loss on the right with stable chronic changes of the right lung base. No acute findings. Electronically Signed   By: Charlett NoseKevin  Dover M.D.   On: 04/26/2016 13:09    Tanvi Gatling M.D on 04/27/2016 at 1:29 PM  Between 7am to 7pm - Pager - [phone]  After 7pm go to [email or web address] - password [email or web address]  [facility] -  Office  [phone]

## 2016-04-28 ENCOUNTER — Inpatient Hospital Stay (HOSPITAL_COMMUNITY): Payer: Medicare Other

## 2016-04-28 DIAGNOSIS — J9621 Acute and chronic respiratory failure with hypoxia: Secondary | ICD-10-CM

## 2016-04-28 DIAGNOSIS — J9622 Acute and chronic respiratory failure with hypercapnia: Secondary | ICD-10-CM

## 2016-04-28 LAB — CBC
HEMATOCRIT: 41.9 % (ref 39.0–52.0)
HEMOGLOBIN: 13 g/dL (ref 13.0–17.0)
MCH: 28.3 pg (ref 26.0–34.0)
MCHC: 31 g/dL (ref 30.0–36.0)
MCV: 91.1 fL (ref 78.0–100.0)
Platelets: 245 10*3/uL (ref 150–400)
RBC: 4.6 MIL/uL (ref 4.22–5.81)
RDW: 15.9 % — ABNORMAL HIGH (ref 11.5–15.5)
WBC: 15.6 10*3/uL — AB (ref 4.0–10.5)

## 2016-04-28 LAB — BASIC METABOLIC PANEL
ANION GAP: 6 (ref 5–15)
BUN: 38 mg/dL — AB (ref 6–20)
CHLORIDE: 100 mmol/L — AB (ref 101–111)
CO2: 34 mmol/L — AB (ref 22–32)
Calcium: 8.4 mg/dL — ABNORMAL LOW (ref 8.9–10.3)
Creatinine, Ser: 1.03 mg/dL (ref 0.61–1.24)
GFR calc Af Amer: >60 mL/min (ref >60)
GLUCOSE: 141 mg/dL — AB (ref 65–99)
POTASSIUM: 5.6 mmol/L — AB (ref 3.5–5.1)
Sodium: 140 mmol/L (ref 135–145)

## 2016-04-28 LAB — PROTIME-INR
INR: 2.11
Prothrombin Time: 24 seconds — ABNORMAL HIGH (ref 11.4–15.2)

## 2016-04-28 MED ORDER — WARFARIN SODIUM 2.5 MG PO TABS
2.5000 mg | ORAL_TABLET | Freq: Once | ORAL | Status: AC
  Administered 2016-04-28: 2.5 mg via ORAL
  Filled 2016-04-28: qty 1

## 2016-04-28 MED ORDER — SODIUM POLYSTYRENE SULFONATE 15 GM/60ML PO SUSP
30.0000 g | Freq: Once | ORAL | Status: AC
  Administered 2016-04-28: 30 g via ORAL
  Filled 2016-04-28: qty 120

## 2016-04-28 MED ORDER — METHYLPREDNISOLONE SODIUM SUCC 125 MG IJ SOLR
60.0000 mg | Freq: Four times a day (QID) | INTRAMUSCULAR | Status: DC
  Administered 2016-04-28 – 2016-04-29 (×5): 60 mg via INTRAVENOUS
  Filled 2016-04-28 (×5): qty 2

## 2016-04-28 MED ORDER — FUROSEMIDE 10 MG/ML IJ SOLN
40.0000 mg | Freq: Once | INTRAMUSCULAR | Status: AC
  Administered 2016-04-28: 40 mg via INTRAVENOUS
  Filled 2016-04-28: qty 4

## 2016-04-28 NOTE — Progress Notes (Signed)
Patient ambulated in hall on RA, oxygen saturations dropped as low as 65 and sustained in the low 70s.  Oxygen applied at 2 L (patient's baseline) and oxygen saturation remained in the mid to upper 70s.  Patient informed to take deep breaths through his nose. No improvement with oxygen saturation until oxygen was increased to 4 L/M where it remained in the mid 80s.  Patient returned to room where oxygen saturation increased to mid 90s on 4 L.  MD was present during this time.

## 2016-04-28 NOTE — Progress Notes (Signed)
PROGRESS NOTE                                                                                                                                                                                                             Patient Demographics:    Roberto Reilly, is a 82 y.o. male, DOB - 11/10/1933, RUE:[id number]  Admit date - 04/26/2016   Admitting Physician Hollice EspySendil K Krishnan, MD  Outpatient Primary MD for the patient is Pearson GrippeJames Kim, MD  LOS - 2  Chief Complaint  Patient presents with  . Shortness of Breath       Brief Narrative  82 year old male with history of A. fib on warfarin, chronic respiratory failure on 2 L nasal cannula at baseline, presents with shortness of breath, wheezing , admitted for COPD exacerbation treatment   Subjective:    Roberto ChristmasJames Pfiffner today has, No headache, No chest pain, No abdominal pain - No Nausea,Denies any chest pain, reports dyspnea at baseline, reports cough is improving  Assessment  & Plan :    Principal Problem:   COPD exacerbation (HCC) Active Problems:   Chronic anticoagulation   Obesity (BMI 30-39.9)   CAD in native artery - LM, RCA, RI & Cx disease --> CABG x 4 (LIMA-LAD, SVG-OM/RI, SVG-rPDA-rPL)   Paroxysmal atrial fibrillation (HCC); CHA2DS2-VASc Score = 4. On Warfarin   Essential hypertension   AAA (abdominal aortic aneurysm) without rupture seen on CT scan  Acute on chronic hypoxic , and hypercarbic respiratory failure - Secondary to COPD exacerbation, on baseline 2 L nasal cannula, requiring 4 L on admission, with evidence of CO2 retention. - Appears to be improving on steroids and nebulizer treatment - Significantly hypoxic on ambulation.  COPD exacerbation - Continue with IV steroids, significantly hypoxic on ambulation, remains with significant wheezing, will increase his IV steroids to 60 mg IV swimming every 6 hours,  continue nebulizer treatment, given productive sputum will continue with IV  antibiotics. - We'll check 2 view chest x-ray to evaluate for volume overload contributing to respiratory failure and possible need of extra dose of Lasix.  Obesity (BMI 30-39.9): - Patient meets criteria with BMI greater than 30  CAD in native artery  - LM, RCA, RI & Cx disease --> CABG x 4 (LIMA-LAD, SVG-OM/RI, SVG-rPDA-rPL): Stable, no evidence of acute ischemia  Paroxysmal atrial fibrillation (HCC); -  CHA2DS2-VASc Score = 4. On Warfarin. Pharmacy to dose. Rate controlled. Use Xopenex instead of albuterol.   Essential hypertension:  - Stable, continue home medications  AAA (abdominal aortic aneurysm) without rupture seen on CT scan:  - Stable.  Hyperkalemia  - We'll hold supplements , will give one-time Kayexalate   Code Status : DNR  Family Communication  : None at bedside  Disposition Plan  : home when stable  Consults  :  none  Procedures  : none  DVT Prophylaxis  :  On warfarin  Lab Results  Component Value Date   PLT 245 04/28/2016    Antibiotics  :   Anti-infectives    Start     Dose/Rate Route Frequency Ordered Stop   04/27/16 1400  cefTRIAXone (ROCEPHIN) 1 g in dextrose 5 % 50 mL IVPB  Status:  Discontinued     1 g 100 mL/hr over 30 Minutes Intravenous Every 24 hours 04/26/16 1516 04/26/16 1526   04/27/16 1400  cefTRIAXone (ROCEPHIN) 1 g in dextrose 5 % 50 mL IVPB     1 g 100 mL/hr over 30 Minutes Intravenous Every 24 hours 04/26/16 1826     04/26/16 1900  azithromycin (ZITHROMAX) 500 mg in dextrose 5 % 250 mL IVPB     500 mg 250 mL/hr over 60 Minutes Intravenous Daily-1800 04/26/16 1826     04/26/16 1445  azithromycin (ZITHROMAX) 500 mg in dextrose 5 % 250 mL IVPB  Status:  Discontinued     500 mg 250 mL/hr over 60 Minutes Intravenous  Once 04/26/16 1433 04/26/16 1434   04/26/16 1445  cefTRIAXone (ROCEPHIN) 1 g in dextrose 5 % 50 mL IVPB     1 g 100 mL/hr over 30 Minutes Intravenous  Once 04/26/16 1435 04/26/16 1532        Objective:    Vitals:   04/27/16 1938 04/27/16 2058 04/28/16 0529 04/28/16 0859  BP:  (!) 134/56 (!) 130/51   Pulse:  62 70   Resp:  20 (!) 24   Temp:  97.6 F (36.4 C) 97.5 F (36.4 C)   TempSrc:  Oral Oral   SpO2: 95% 92% 95% 93%  Weight:      Height:        Wt Readings from Last 3 Encounters:  04/26/16 98.9 kg (218 lb)  03/22/16 101.2 kg (223 lb)  02/05/16 96.2 kg (212 lb)     Intake/Output Summary (Last 24 hours) at 04/28/16 1400 Last data filed at 04/28/16 0928  Gross per 24 hour  Intake             1140 ml  Output              600 ml  Net              540 ml     Physical Exam  Awake Alert, Oriented X 3, No new F.N deficits, Normal affect [place].AT,PERRAL Supple Neck,No JVD, Symmetrical Chest wall movement, Diminished air movement bilaterally, diffuse wheezing bilaterally RRR,No Gallops,Rubs or new Murmurs, No Parasternal Heave +ve B.Sounds, Abd Soft, No tenderness, No organomegaly appriciated, No rebound - guarding or rigidity. No Cyanosis, Clubbing , +1 edema B/L.   Data Review:    CBC  Recent Labs Lab 04/26/16 1223 04/28/16 0347  WBC 12.7* 15.6*  HGB 14.1 13.0  HCT 43.4 41.9  PLT 228 245  MCV 89.3 91.1  MCH 29.0 28.3  MCHC 32.5 31.0  RDW 15.5 15.9*    Chemistries  Recent Labs Lab 04/26/16 1223 04/28/16 0347  NA 138 140  K 4.8 5.6*  CL 98* 100*  CO2 31 34*  GLUCOSE 152* 141*  BUN 24* 38*  CREATININE 1.10 1.03  CALCIUM 8.8* 8.4*   ------------------------------------------------------------------------------------------------------------------ No results for input(s): CHOL, HDL, LDLCALC, TRIG, CHOLHDL, LDLDIRECT in the last 72 hours.  Lab Results  Component Value Date   HGBA1C 5.6 11/02/2015   ------------------------------------------------------------------------------------------------------------------ No results for input(s): TSH, T4TOTAL, T3FREE, THYROIDAB in the last 72 hours.  Invalid input(s):  FREET3 ------------------------------------------------------------------------------------------------------------------ No results for input(s): VITAMINB12, FOLATE, FERRITIN, TIBC, IRON, RETICCTPCT in the last 72 hours.  Coagulation profile  Recent Labs Lab 04/26/16 1223 04/27/16 0349 04/28/16 0347  INR 2.66 2.55 2.11    No results for input(s): DDIMER in the last 72 hours.  Cardiac Enzymes No results for input(s): CKMB, TROPONINI, MYOGLOBIN in the last 168 hours.  Invalid input(s): CK ------------------------------------------------------------------------------------------------------------------    Component Value Date/Time   BNP 247.3 (H) 04/26/2016 1232    Inpatient Medications  Scheduled Meds: . amLODipine  10 mg Oral Daily  . aspirin EC  81 mg Oral Daily  . azithromycin  500 mg Intravenous q1800  . cefTRIAXone (ROCEPHIN)  IV  1 g Intravenous Q24H  . furosemide  40 mg Oral q morning - 10a  . levalbuterol  0.63 mg Nebulization Q6H  . methylPREDNISolone (SOLU-MEDROL) injection  60 mg Intravenous Q6H  . pantoprazole  40 mg Oral Daily  . potassium chloride SA  20 mEq Oral Daily  . simvastatin  20 mg Oral q1800  . Warfarin - Pharmacist Dosing Inpatient   Does not apply q1800   Continuous Infusions: PRN Meds:.acetaminophen **OR** acetaminophen, ipratropium-albuterol, ondansetron **OR** ondansetron (ZOFRAN) IV, polyethylene glycol  Micro Results No results found for this or any previous visit (from the past 240 hour(s)).  Radiology Reports Dg Chest 2 View  Result Date: 04/26/2016 CLINICAL DATA:  Shortness of breath, productive cough for 1 week. EXAM: CHEST  2 VIEW COMPARISON:  11/03/2015 FINDINGS: Prior CABG. Volume loss on the right with probable scarring at the right base. Postoperative changes on the right. No confluent airspace opacities. Heart is borderline in size. No acute bony abnormality. IMPRESSION: Postoperative changes and volume loss on the right with  stable chronic changes of the right lung base. No acute findings. Electronically Signed   By: Charlett Nose M.D.   On: 04/26/2016 13:09    ELGERGAWY, DAWOOD M.D on 04/28/2016 at 2:00 PM  Between 7am to 7pm - Pager - [phone]  After 7pm go to [email or web address] - password [facility]  [facility] -  Office  [phone]

## 2016-04-28 NOTE — Progress Notes (Signed)
ANTICOAGULATION CONSULT NOTE - Follow-Up Consult  Pharmacy Consult for warfarin Indication: atrial fibrillation  Allergies  Allergen Reactions  . Prednisone Other (See Comments)    Makes pt jittery and nervous    Patient Measurements: Height: 6' (182.9 cm) Weight: 218 lb (98.9 kg) IBW/kg (Calculated) : 77.6   Vital Signs: Temp: 97.9 F (36.6 C) (12/07 1402) Temp Source: Oral (12/07 1402) BP: 151/74 (12/07 1402) Pulse Rate: 76 (12/07 1402)  Labs:  Recent Labs  04/26/16 1223 04/27/16 0349 04/28/16 0347  HGB 14.1  --  13.0  HCT 43.4  --  41.9  PLT 228  --  245  LABPROT 28.9* 27.9* 24.0*  INR 2.66 2.55 2.11  CREATININE 1.10  --  1.03    Estimated Creatinine Clearance: 69.7 mL/min (by C-G formula based on SCr of 1.03 mg/dL).   Medical History: Past Medical History:  Diagnosis Date  . AAA (abdominal aortic aneurysm) (HCC)    4.2 X 4.8 cm 04/2013 CT; declined re-eval 02/25/15  . Anxiety   . CAD in native artery March 2007   Cath for dyspnea on exertion: 75% distal LM, 85% RI, 95% mid-distal Cx, in multiple RCA lesions with 95% distal. --> Referred for CABG; has declined further noninvasive evaluation in the absence of worsening symptoms  . COPD (chronic obstructive pulmonary disease) with emphysema (HCC) 05/23/2007   Hosp 5/29-6/01/12- COPD exacerbation ONOX 12/08/10- desaturated to less than 88% for over an hour, qualifying for home O2 during sleep    . Diverticulosis of colon with hemorrhage 05/24/2013  . Dyslipidemia, goal LDL below 70   . H/O echocardiogram March ; January 2015   a) Normal LV size and cavity appeared normal function. Grade 1 diastolic dysfunction. Limited study.; b) normal LV size and function with EF 60-65%. Aortic sclerosis without stenosis, mildly increased PA pressures of 44mmHg but normal IVC and RA/RV size  . History of non-ST Elevation MI (myocardial infarction) March 2007   Admitted with COPD exacerbation, given by CHF. Had some chest pain  with positive troponins. Cath showed multivessel disease, referred for CABG  . History of pneumonia   . Hypertension   . Hypoxia     chronic, on home O2  . Myocardial infarction   . Obesity (BMI 30-39.9)    One year ago weighed 265 pounds, (12/2012) -- now 234 pounds  . PAF (paroxysmal atrial fibrillation) (HCC)    Anticoagulated on warfarin. Stable -- post op  . Pneumonia    hx  . 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 Seasonal allergies   . Shortness of breath dyspnea       Assessment: 82 y.o. male with PMH significant for atrial fibrillation on chronic warfarin who [facility] shortness of breath, wheezing and cough, admitted for COPD exacerbation. Pharmacy consulted to dose warfarin while patient in the hospital.  -Home dose warfarin 5mg  on M,W,F & Sat, 2.5mg  on T,TH,Sun - INR 2.11 today, therapeutic - H/H, Pltc WNL - DDI: broad spectrum antibiotics may increase the hypoprothrombinemic effects of warfarin  Goal of Therapy:  INR 2-3   Plan:  Warfarin 2.5mg  PO x 1 tonight as per home regimen Daily PT/INR Monitor for s/sx of bleeding   Greer PickerelJigna Rei Medlen, PharmD, BCPS Pager: [phone] 04/28/2016 2:06 PM

## 2016-04-28 NOTE — Progress Notes (Signed)
PT demonstrated verbal and hands on understanding of Flutter device. 

## 2016-04-29 LAB — BASIC METABOLIC PANEL
ANION GAP: 6 (ref 5–15)
BUN: 46 mg/dL — ABNORMAL HIGH (ref 6–20)
CALCIUM: 8.1 mg/dL — AB (ref 8.9–10.3)
CHLORIDE: 97 mmol/L — AB (ref 101–111)
CO2: 38 mmol/L — AB (ref 22–32)
Creatinine, Ser: 1.29 mg/dL — ABNORMAL HIGH (ref 0.61–1.24)
GFR calc non Af Amer: 51 mL/min — ABNORMAL LOW (ref >60)
GFR, EST AFRICAN AMERICAN: 59 mL/min — AB (ref >60)
Glucose, Bld: 148 mg/dL — ABNORMAL HIGH (ref 65–99)
Potassium: 4.6 mmol/L (ref 3.5–5.1)
SODIUM: 141 mmol/L (ref 135–145)

## 2016-04-29 LAB — CBC
HEMATOCRIT: 42.5 % (ref 39.0–52.0)
HEMOGLOBIN: 13 g/dL (ref 13.0–17.0)
MCH: 27.9 pg (ref 26.0–34.0)
MCHC: 30.6 g/dL (ref 30.0–36.0)
MCV: 91.2 fL (ref 78.0–100.0)
Platelets: 240 10*3/uL (ref 150–400)
RBC: 4.66 MIL/uL (ref 4.22–5.81)
RDW: 15.7 % — ABNORMAL HIGH (ref 11.5–15.5)
WBC: 13.6 10*3/uL — AB (ref 4.0–10.5)

## 2016-04-29 LAB — PROTIME-INR
INR: 2.46
Prothrombin Time: 27.1 seconds — ABNORMAL HIGH (ref 11.4–15.2)

## 2016-04-29 MED ORDER — IPRATROPIUM BROMIDE 0.02 % IN SOLN
0.5000 mg | Freq: Four times a day (QID) | RESPIRATORY_TRACT | Status: DC
  Administered 2016-04-29 (×3): 0.5 mg via RESPIRATORY_TRACT
  Filled 2016-04-29 (×3): qty 2.5

## 2016-04-29 MED ORDER — PANTOPRAZOLE SODIUM 40 MG PO TBEC: 40.0000 mg | DELAYED_RELEASE_TABLET | Freq: Every day | ORAL | 0 refills | Status: DC

## 2016-04-29 MED ORDER — MOMETASONE FURO-FORMOTEROL FUM 200-5 MCG/ACT IN AERO
2.0000 | INHALATION_SPRAY | Freq: Two times a day (BID) | RESPIRATORY_TRACT | Status: DC
  Administered 2016-04-29 – 2016-04-30 (×3): 2 via RESPIRATORY_TRACT
  Filled 2016-04-29: qty 8.8

## 2016-04-29 MED ORDER — METHYLPREDNISOLONE SODIUM SUCC 125 MG IJ SOLR
60.0000 mg | Freq: Two times a day (BID) | INTRAMUSCULAR | Status: DC
  Administered 2016-04-29: 60 mg via INTRAVENOUS
  Filled 2016-04-29: qty 2

## 2016-04-29 MED ORDER — LEVALBUTEROL HCL 0.63 MG/3ML IN NEBU
0.6300 mg | INHALATION_SOLUTION | Freq: Four times a day (QID) | RESPIRATORY_TRACT | Status: DC
  Administered 2016-04-29 (×3): 0.63 mg via RESPIRATORY_TRACT
  Filled 2016-04-29 (×3): qty 3

## 2016-04-29 MED ORDER — BUDESONIDE-FORMOTEROL FUMARATE 160-4.5 MCG/ACT IN AERO: 2.0000 | INHALATION_SPRAY | Freq: Two times a day (BID) | RESPIRATORY_TRACT | 0 refills | Status: DC

## 2016-04-29 MED ORDER — LEVALBUTEROL HCL 0.63 MG/3ML IN NEBU
0.6300 mg | INHALATION_SOLUTION | Freq: Three times a day (TID) | RESPIRATORY_TRACT | Status: DC
  Administered 2016-04-30 (×2): 0.63 mg via RESPIRATORY_TRACT
  Filled 2016-04-29 (×2): qty 3

## 2016-04-29 MED ORDER — IPRATROPIUM BROMIDE 0.02 % IN SOLN
0.5000 mg | Freq: Three times a day (TID) | RESPIRATORY_TRACT | Status: DC
  Administered 2016-04-30 (×2): 0.5 mg via RESPIRATORY_TRACT
  Filled 2016-04-29 (×2): qty 2.5

## 2016-04-29 MED ORDER — PREDNISONE 10 MG PO TABS: ORAL_TABLET | ORAL | 0 refills | Status: DC

## 2016-04-29 MED ORDER — WARFARIN SODIUM 2.5 MG PO TABS
2.5000 mg | ORAL_TABLET | Freq: Once | ORAL | Status: AC
  Administered 2016-04-29: 2.5 mg via ORAL
  Filled 2016-04-29: qty 1

## 2016-04-29 NOTE — Progress Notes (Signed)
PROGRESS NOTE                                                                                                                                                                                                             Patient Demographics:    Roberto Reilly, is a 82 y.o. male, DOB - 11/08/1933, ZOX:[id number]  Admit date - 04/26/2016   Admitting Physician Hollice EspySendil K Krishnan, MD  Outpatient Primary MD for the patient is Pearson GrippeJames Kim, MD  LOS - 3  Chief Complaint  Patient presents with  . Shortness of Breath       Brief Narrative  82 year old male with history of A. fib on warfarin, chronic respiratory failure on 2 L nasal cannula at baseline, presents with shortness of breath, wheezing , admitted for COPD exacerbation treatment   Subjective:    Roberto ChristmasJames Christian today has, No headache, No chest pain, No abdominal pain - No Nausea,Denies any chest pain, reports dyspnea at baseline, reports cough With white phlegm  Assessment  & Plan :    Principal Problem:   COPD exacerbation (HCC) Active Problems:   Chronic anticoagulation   Obesity (BMI 30-39.9)   CAD in native artery - LM, RCA, RI & Cx disease --> CABG x 4 (LIMA-LAD, SVG-OM/RI, SVG-rPDA-rPL)   Paroxysmal atrial fibrillation (HCC); CHA2DS2-VASc Score = 4. On Warfarin   Essential hypertension   AAA (abdominal aortic aneurysm) without rupture seen on CT scan  Acute on chronic hypoxic , and hypercarbic respiratory failure - Secondary to COPD exacerbation, on baseline 2 L nasal cannula, requiring 4 L on admission, with evidence of CO2 retention. - Appears to be improving on steroids and nebulizer treatment - Significantly hypoxic on ambulation.  COPD exacerbation - Continue with IV steroids, Appears to be improving, so we'll decrease IV steroids,  continue nebulizer treatment, started on Xeloda, given productive sputum will continue with IV antibiotics.  Obesity (BMI 30-39.9): - Patient meets  criteria with BMI greater than 30  CAD in native artery  - LM, RCA, RI & Cx disease --> CABG x 4 (LIMA-LAD, SVG-OM/RI, SVG-rPDA-rPL): Stable, no evidence of acute ischemia  Paroxysmal atrial fibrillation (HCC); - CHA2DS2-VASc Score = 4. On Warfarin. Pharmacy to dose. Rate controlled. Use Xopenex instead of albuterol.   Essential hypertension:  - Stable, continue home medications  AAA (abdominal aortic aneurysm) without  rupture seen on CT scan:  - Stable.  Hyperkalemia  - Resolved with Kayexalate, continue to hold supplements .   Code Status : DNR  Family Communication  : None at bedside  Disposition Plan  : home when stable  Consults  :  none  Procedures  : none  DVT Prophylaxis  :  On warfarin  Lab Results  Component Value Date   PLT 240 04/29/2016    Antibiotics  :   Anti-infectives    Start     Dose/Rate Route Frequency Ordered Stop   04/27/16 1400  cefTRIAXone (ROCEPHIN) 1 g in dextrose 5 % 50 mL IVPB  Status:  Discontinued     1 g 100 mL/hr over 30 Minutes Intravenous Every 24 hours 04/26/16 1516 04/26/16 1526   04/27/16 1400  cefTRIAXone (ROCEPHIN) 1 g in dextrose 5 % 50 mL IVPB     1 g 100 mL/hr over 30 Minutes Intravenous Every 24 hours 04/26/16 1826     04/26/16 1900  azithromycin (ZITHROMAX) 500 mg in dextrose 5 % 250 mL IVPB     500 mg 250 mL/hr over 60 Minutes Intravenous Daily-1800 04/26/16 1826     04/26/16 1445  azithromycin (ZITHROMAX) 500 mg in dextrose 5 % 250 mL IVPB  Status:  Discontinued     500 mg 250 mL/hr over 60 Minutes Intravenous  Once 04/26/16 1433 04/26/16 1434   04/26/16 1445  cefTRIAXone (ROCEPHIN) 1 g in dextrose 5 % 50 mL IVPB     1 g 100 mL/hr over 30 Minutes Intravenous  Once 04/26/16 1435 04/26/16 1532        Objective:   Vitals:   04/28/16 2124 04/29/16 0607 04/29/16 0809 04/29/16 0811  BP: (!) 143/61 138/69    Pulse: 80 70    Resp: 20 20    Temp: 98.4 F (36.9 C) 97.5 F (36.4 C)    TempSrc: Oral Oral    SpO2:  94% 96% 97% 97%  Weight:      Height:        Wt Readings from Last 3 Encounters:  04/26/16 98.9 kg (218 lb)  03/22/16 101.2 kg (223 lb)  02/05/16 96.2 kg (212 lb)     Intake/Output Summary (Last 24 hours) at 04/29/16 1034 Last data filed at 04/29/16 1610  Gross per 24 hour  Intake              300 ml  Output             1475 ml  Net            -1175 ml     Physical Exam  Awake Alert, Oriented X 3, No new F.N deficits, Normal affect [place].AT,PERRAL Supple Neck,No JVD, Symmetrical Chest wall movement, Good air movement bilaterally, wheezing significantly improved RRR,No Gallops,Rubs or new Murmurs, No Parasternal Heave +ve B.Sounds, Abd Soft, No tenderness, No organomegaly appriciated, No rebound - guarding or rigidity. No Cyanosis, Clubbing , +1 edema B/L.   Data Review:    CBC  Recent Labs Lab 04/26/16 1223 04/28/16 0347 04/29/16 0503  WBC 12.7* 15.6* 13.6*  HGB 14.1 13.0 13.0  HCT 43.4 41.9 42.5  PLT 228 245 240  MCV 89.3 91.1 91.2  MCH 29.0 28.3 27.9  MCHC 32.5 31.0 30.6  RDW 15.5 15.9* 15.7*    Chemistries   Recent Labs Lab 04/26/16 1223 04/28/16 0347 04/29/16 0503  NA 138 140 141  K 4.8 5.6* 4.6  CL 98* 100* 97*  CO2 31 34* 38*  GLUCOSE 152* 141* 148*  BUN 24* 38* 46*  CREATININE 1.10 1.03 1.29*  CALCIUM 8.8* 8.4* 8.1*   ------------------------------------------------------------------------------------------------------------------ No results for input(s): CHOL, HDL, LDLCALC, TRIG, CHOLHDL, LDLDIRECT in the last 72 hours.  Lab Results  Component Value Date   HGBA1C 5.6 11/02/2015   ------------------------------------------------------------------------------------------------------------------ No results for input(s): TSH, T4TOTAL, T3FREE, THYROIDAB in the last 72 hours.  Invalid input(s): FREET3 ------------------------------------------------------------------------------------------------------------------ No results for input(s):  VITAMINB12, FOLATE, FERRITIN, TIBC, IRON, RETICCTPCT in the last 72 hours.  Coagulation profile  Recent Labs Lab 04/26/16 1223 04/27/16 0349 04/28/16 0347 04/29/16 0503  INR 2.66 2.55 2.11 2.46    No results for input(s): DDIMER in the last 72 hours.  Cardiac Enzymes No results for input(s): CKMB, TROPONINI, MYOGLOBIN in the last 168 hours.  Invalid input(s): CK ------------------------------------------------------------------------------------------------------------------    Component Value Date/Time   BNP 247.3 (H) 04/26/2016 1232    Inpatient Medications  Scheduled Meds: . amLODipine  10 mg Oral Daily  . aspirin EC  81 mg Oral Daily  . azithromycin  500 mg Intravenous q1800  . cefTRIAXone (ROCEPHIN)  IV  1 g Intravenous Q24H  . furosemide  40 mg Oral q morning - 10a  . ipratropium  0.5 mg Nebulization QID  . levalbuterol  0.63 mg Nebulization QID  . methylPREDNISolone (SOLU-MEDROL) injection  60 mg Intravenous Q12H  . mometasone-formoterol  2 puff Inhalation BID  . pantoprazole  40 mg Oral Daily  . simvastatin  20 mg Oral q1800  . Warfarin - Pharmacist Dosing Inpatient   Does not apply q1800   Continuous Infusions: PRN Meds:.acetaminophen **OR** acetaminophen, ipratropium-albuterol, ondansetron **OR** ondansetron (ZOFRAN) IV, polyethylene glycol  Micro Results No results found for this or any previous visit (from the past 240 hour(s)).  Radiology Reports Dg Chest 2 View  Result Date: 04/28/2016 CLINICAL DATA:  Dyspnea and cough. EXAM: CHEST  2 VIEW COMPARISON:  04/26/2016 CXR FINDINGS: Patient status post median sternotomy and CABG. Heart is enlarged but stable. No aortic arch aneurysm. Chronic blunting and pleural thickening at the right lung base. Diffuse mild interstitial prominence is noted which may reflect bronchitic change. Old left-sided rib fractures are identified involving the left fifth through seventh ribs. EKG leads project over the right medial  hemithorax and mediastinum. IMPRESSION: Stable right basilar scarring and pleural thickening. Bilateral increased interstitial prominence of the lungs without pneumonic consolidations. Mild bronchitic change is suspected. Electronically Signed   By: Tollie Ethavid  Kwon M.D.   On: 04/28/2016 15:58   Dg Chest 2 View  Result Date: 04/26/2016 CLINICAL DATA:  Shortness of breath, productive cough for 1 week. EXAM: CHEST  2 VIEW COMPARISON:  11/03/2015 FINDINGS: Prior CABG. Volume loss on the right with probable scarring at the right base. Postoperative changes on the right. No confluent airspace opacities. Heart is borderline in size. No acute bony abnormality. IMPRESSION: Postoperative changes and volume loss on the right with stable chronic changes of the right lung base. No acute findings. Electronically Signed   By: Charlett NoseKevin  Dover M.D.   On: 04/26/2016 13:09    Jacia Sickman M.D on 04/29/2016 at 10:34 AM  Between 7am to 7pm - Pager - [phone]  After 7pm go to [email or web address] - password [email or web address]  [facility] -  Office  [phone]

## 2016-04-29 NOTE — Progress Notes (Signed)
ANTICOAGULATION CONSULT NOTE - Follow-Up Consult  Pharmacy Consult for warfarin Indication: atrial fibrillation  Allergies  Allergen Reactions  . Prednisone Other (See Comments)    Makes pt jittery and nervous    Patient Measurements: Height: 6' (182.9 cm) Weight: 218 lb (98.9 kg) IBW/kg (Calculated) : 77.6   Vital Signs: Temp: 97.5 F (36.4 C) (12/08 0607) Temp Source: Oral (12/08 0607) BP: 138/69 (12/08 0607) Pulse Rate: 70 (12/08 0607)  Labs:  Recent Labs  04/26/16 1223 04/27/16 0349 04/28/16 0347 04/29/16 0503  HGB 14.1  --  13.0 13.0  HCT 43.4  --  41.9 42.5  PLT 228  --  245 240  LABPROT 28.9* 27.9* 24.0* 27.1*  INR 2.66 2.55 2.11 2.46  CREATININE 1.10  --  1.03 1.29*    Estimated Creatinine Clearance: 55.6 mL/min (by C-G formula based on SCr of 1.29 mg/dL (H)).   Assessment: 82 y.o. male with PMH significant for atrial fibrillation on chronic warfarin who [facility] shortness [facility] breath, wheezing and cough, admitted for COPD exacerbation. Pharmacy consulted to dose warfarin while patient in the hospital.   Home dose warfarin 5mg  on M,W,F & Sat, 2.5mg  on T,TH,Sun  INR 2.11 today, therapeutic  H/H, Pltc WNL  DDI: broad spectrum antibiotics and steroids may increase the hypoprothrombinemic effects of warfarin  Goal of Therapy:  INR 2-3   Plan:   Repeat warfarin 2.5mg  PO x 1 tonight (would be 5 mg per home regimen) given rise in INR and concomitant illness, abx, and steroids  Daily PT/INR  Monitor for s/sx of bleeding   Bernadene Personrew Elise Gladden, PharmD, BCPS Pager: [phone] 04/29/2016, 11:28 AM

## 2016-04-29 NOTE — Evaluation (Signed)
Physical Therapy One Time Evaluation Patient Details Name: Roberto Reilly MRN: [id number] DOB: 04/15/1934 Today's Date: 04/29/2016   History of Present Illness  82 year old male with history of A. fib on warfarin, chronic respiratory failure on 2 L nasal cannula at baseline, CABG x4, R foot transmet amputation and admitted for COPD exacerbation   Clinical Impression  Patient evaluated by Physical Therapy with no further acute PT needs identified. All education has been completed and the patient has no further questions.  Pt mobilizing well and appears near his baseline except he requires at least 3L O2 for mobility at this time.  Pt agreeable to ambulate with nursing staff during acute stay. See below for any follow-up Physical Therapy or equipment needs. PT is signing off. Thank you for this referral.     Follow Up Recommendations No PT follow up    Equipment Recommendations  None recommended by PT    Recommendations for Other Services       Precautions / Restrictions Precautions Precaution Comments: chronic oxygen      Mobility  Bed Mobility Overal bed mobility: Needs Assistance             General bed mobility comments: pt up in the chair  Transfers Overall transfer level: Needs assistance Equipment used: None Transfers: Sit to/from Stand Sit to Stand: Supervision            Ambulation/Gait Ambulation/Gait assistance: Min guard;Supervision Ambulation Distance (Feet): 160 Feet Assistive device: None Gait Pattern/deviations: Step-through pattern;Decreased stride length     General Gait Details: pt able to tolerate ambulating 80 feet and then required seated rest break, states this is baseline at home, typically goes room to room, SpO2 dropped to 80% on 3L however improved to 95% with seated rest break  Stairs            Wheelchair Mobility    Modified Rankin (Stroke Patients Only)       Balance                                              Pertinent Vitals/Pain Pain Assessment: No/denies pain    Home Living Family/patient expects to be discharged to:: Private residence Living Arrangements: Alone   Type of Home: Apartment Home Access: Level entry     Home Layout: One level Home Equipment: Cane - single point Additional Comments: pt uses 2L O2 at baseline,     Prior Function Level of Independence: Independent with assistive device(s)         Comments: cane at baseline     Hand Dominance        Extremity/Trunk Assessment               Lower Extremity Assessment: Generalized weakness         Communication   Communication: No difficulties  Cognition Arousal/Alertness: Awake/alert Behavior During Therapy: WFL for tasks assessed/performed Overall Cognitive Status: Within Functional Limits for tasks assessed                      General Comments      Exercises     Assessment/Plan    PT Assessment Patent does not need any further PT services  PT Problem List            PT Treatment Interventions      PT Goals (Current goals  can be found in the Care Plan section)  Acute Rehab PT Goals PT Goal Formulation: All assessment and education complete, DC therapy    Frequency     Barriers to discharge        Co-evaluation               End of Session Equipment Utilized During Treatment: Oxygen Activity Tolerance: Patient tolerated treatment well Patient left: in chair;with call bell/phone within reach;with chair alarm set           Time: [id number] PT Time Calculation (min) (ACUTE ONLY): 14 min   Charges:   PT Evaluation $PT Eval Low Complexity: 1 Procedure     PT G Codes:        Roberto Reilly,Roberto Reilly 04/29/2016, 12:47 PM Zenovia JarredKati Nancyjo Reilly, PT, DPT 04/29/2016 Pager: [phone]

## 2016-04-30 LAB — PROTIME-INR
INR: 2.39
Prothrombin Time: 26.5 seconds — ABNORMAL HIGH (ref 11.4–15.2)

## 2016-04-30 MED ORDER — PREDNISONE 50 MG PO TABS
50.0000 mg | ORAL_TABLET | Freq: Every day | ORAL | Status: DC
  Administered 2016-04-30: 50 mg via ORAL
  Filled 2016-04-30: qty 1

## 2016-04-30 MED ORDER — WARFARIN SODIUM 2.5 MG PO TABS
2.5000 mg | ORAL_TABLET | Freq: Once | ORAL | Status: AC
  Administered 2016-04-30: 2.5 mg via ORAL
  Filled 2016-04-30: qty 1

## 2016-04-30 NOTE — Progress Notes (Signed)
ANTICOAGULATION CONSULT NOTE - Follow-Up Consult  Pharmacy Consult for warfarin Indication: atrial fibrillation  Allergies  Allergen Reactions  . Prednisone Other (See Comments)    Makes pt jittery and nervous    Patient Measurements: Height: 6' (182.9 cm) Weight: 218 lb (98.9 kg) IBW/kg (Calculated) : 77.6   Vital Signs: Temp: 97.9 F (36.6 C) (12/09 0518) Temp Source: Axillary (12/09 0518) BP: 123/58 (12/09 0518) Pulse Rate: 75 (12/09 0801)  Labs:  Recent Labs  04/28/16 0347 04/29/16 0503 04/30/16 0510  HGB 13.0 13.0  --   HCT 41.9 42.5  --   PLT 245 240  --   LABPROT 24.0* 27.1* 26.5*  INR 2.11 2.46 2.39  CREATININE 1.03 1.29*  --     Estimated Creatinine Clearance: 55.6 mL/min (by C-G formula based on SCr of 1.29 mg/dL (H)).   Assessment: 82 y.o. male with PMH significant for atrial fibrillation on chronic warfarin who [facility] with shortness of breath, wheezing and cough, admitted for COPD exacerbation. Pharmacy consulted to dose warfarin while patient in the hospital.   Home dose warfarin 5mg  on M,W,F & Sat, 2.5mg  on T,TH,Sun  INR therapeutic  CBC wnl as of 12/8  DDI: broad spectrum antibiotics and steroids may increase the hypoprothrombinemic effects of warfarin.  Also on ASA 81  Goal of Therapy:  INR 2-3   Plan:   Plan for discharge this afternoon; no further abx but will continue steroid taper  Warfarin 2.5mg  PO x 1 today (would be 5 mg per home regimen); would use lower dose for today given recent factors to elevate INR, then discharge on home regimen with INR check in </= 5d  Daily PT/INR  Monitor for s/sx of bleeding   Bernadene Personrew Evelyn Aguinaldo, PharmD, BCPS Pager: [phone] 04/30/2016, 9:50 AM

## 2016-04-30 NOTE — Discharge Instructions (Signed)
Follow with Primary MD Roberto GrippeJames Kim, MD in 7 days   Get CBC, CMP, 2 view Chest X ray checked  by Primary MD next visit.    Activity: As tolerated with Full fall precautions use walker/cane & assistance as needed   Disposition Home    Diet: Heart Healthy , with feeding assistance and aspiration precautions.  For Heart failure patients - Check your Weight same time everyday, if you gain over 2 pounds, or you develop in leg swelling, experience more shortness of breath or chest pain, call your Primary MD immediately. Follow Cardiac Low Salt Diet and 1.5 lit/day fluid restriction.   On your next visit with your primary care physician please Get Medicines reviewed and adjusted.   Please request your Prim.MD to go over all Hospital Tests and Procedure/Radiological results at the follow up, please get all Hospital records sent to your Prim MD by signing hospital release before you go home.   If you experience worsening of your admission symptoms, develop shortness of breath, life threatening emergency, suicidal or homicidal thoughts you must seek medical attention immediately by calling 911 or calling your MD immediately  if symptoms less severe.  You Must read complete instructions/literature along with all the possible adverse reactions/side effects for all the Medicines you take and that have been prescribed to you. Take any new Medicines after you have completely understood and accpet all the possible adverse reactions/side effects.   Do not drive, operating heavy machinery, perform activities at heights, swimming or participation in water activities or provide baby sitting services if your were admitted for syncope or siezures until you have seen by Primary MD or a Neurologist and advised to do so again.  Do not drive when taking Pain medications.    Do not take more than prescribed Pain, Sleep and Anxiety Medications  Special Instructions: If you have smoked or chewed Tobacco  in the  last 2 yrs please stop smoking, stop any regular Alcohol  and or any Recreational drug use.  Wear Seat belts while driving.   Please note  You were cared for by a hospitalist during your hospital stay. If you have any questions about your discharge medications or the care you received while you were in the hospital after you are discharged, you can call the unit and asked to speak with the hospitalist on call if the hospitalist that took care of you is not available. Once you are discharged, your primary care physician will handle any further medical issues. Please note that NO REFILLS for any discharge medications will be authorized once you are discharged, as it is imperative that you return to your primary care physician (or establish a relationship with a primary care physician if you do not have one) for your aftercare needs so that they can reassess your need for medications and monitor your lab values.

## 2016-04-30 NOTE — Discharge Summary (Signed)
Roberto Reilly, is a 82 y.o. male  DOB [birth year]  MRN [id number].  Admission date:  04/26/2016  Admitting Physician  Hollice Espy, MD  Discharge Date:  04/30/2016   Primary MD  Pearson Grippe, MD  Recommendations for primary care physician for things to follow:  - please check CBC, BMP, 2 view chest x-ray during next visit - please have INR checked and dose warfarin accordingly   Admission Diagnosis  COPD exacerbation (HCC) [J44.1]   Discharge Diagnosis  COPD exacerbation (HCC) [J44.1]    Principal Problem:   COPD exacerbation (HCC) Active Problems:   Chronic anticoagulation   Obesity (BMI 30-39.9)   CAD in native artery - LM, RCA, RI & Cx disease --> CABG x 4 (LIMA-LAD, SVG-OM/RI, SVG-rPDA-rPL)   Paroxysmal atrial fibrillation (HCC); CHA2DS2-VASc Score = 4. On Warfarin   Essential hypertension   AAA (abdominal aortic aneurysm) without rupture seen on CT scan      Past Medical History:  Diagnosis Date  . AAA (abdominal aortic aneurysm) (HCC)    4.2 X 4.8 cm 04/2013 CT; declined re-eval 02/25/15  . Anxiety   . CAD in native artery March 2007   Cath for dyspnea on exertion: 75% distal LM, 85% RI, 95% mid-distal Cx, in multiple RCA lesions with 95% distal. --> Referred for CABG; has declined further noninvasive evaluation in the absence of worsening symptoms  . COPD (chronic obstructive pulmonary disease) with emphysema (HCC) 05/23/2007   Hosp 5/29-6/01/12- COPD exacerbation ONOX 12/08/10- desaturated to less than 88% for over an hour, qualifying for home O2 during sleep    . Diverticulosis of colon with hemorrhage 05/24/2013  . Dyslipidemia, goal LDL below 70   . H/O echocardiogram March ; January 2015   a) Normal LV size and cavity appeared normal function. Grade 1 diastolic dysfunction. Limited study.; b) normal LV size and function with EF 60-65%. Aortic sclerosis without stenosis, mildly  increased PA pressures of but normal IVC and RA/RV size  . History of non-ST Elevation MI (myocardial infarction) March 2007   Admitted with COPD exacerbation, given by CHF. Had some chest pain with positive troponins. Cath showed multivessel disease, referred for CABG  . History of pneumonia   . Hypertension   . Hypoxia     chronic, on home O2  . Myocardial infarction   . Obesity (BMI 30-39.9)    One year ago weighed 265 pounds, (12/2012) -- now 234 pounds  . PAF (paroxysmal atrial fibrillation) (HCC)    Anticoagulated on warfarin. Stable -- post op  . Pneumonia    hx  . 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 Seasonal allergies   . Shortness of breath dyspnea     Past Surgical History:  Procedure Laterality Date  . ACHILLES TENDON LENGTHENING Right 10/08/2015   Procedure: ACHILLES TENDON LENGTHENING;  Surgeon: Toni Arthurs, MD;  Location: [facility] OR;  Service: Orthopedics;  Laterality: Right;  . AMPUTATION Right 10/08/2015   Procedure: RIGHT FOOT  TRANSMET AMPUTATION;  Surgeon: Toni ArthursJohn Hewitt, MD;  [facility];  Service: Orthopedics;  Laterality: Right;  . CARDIAC CATHETERIZATION  03 28 2007   NORMAL LV FUNCTION/ ABDOMINAL AORTA STENOSIS,75%-85%. RIGHT FEMORAL ARTERY :CATHETERS USED A  4-FRENCH WITH A 4-FRENCH SHEATH  . CATARACT EXTRACTION     Laser  . COLONOSCOPY N/A 05/24/2013   Procedure: COLONOSCOPY;  Surgeon: Iva Booparl E Gessner, MD;  Location: [facility] ENDOSCOPY;  Service: Endoscopy;  Laterality: N/A;  . CORONARY ARTERY BYPASS GRAFT    . RLL resection for Hamartoma     lungs  . RUL for hamartoma     lung       History of present illness and  Hospital Course:     Kindly see H&P for history of present illness and admission details, please review complete Labs, Consult reports and Test reports for all details in brief  HPI  from the history and physical done on the day of admission 04/26/2016  HPI: Roberto Reilly is a 82 y.o. male with medical history  significant of atrial fibrillation on chronic Coumadin plus chronic respiratory failure secondary COPD on 2 L nasal cannula oxygen at night who over the last few days as per had progressively worsening dyspnea on exertion plus productive cough with initially white and then green sputum hitting got to the point where he had a hard time catching his breath and had a lot of wheezing so he came into the emergency room for further evaluation.   ED Course: In the emergency room, patient was noted to be hypoxic requiring 4 L nasal cannula. This was at rest. When they try to ambulate him to the bathroom on 4 L, his oxygen saturations dropped to 70%. Patient was given doses of nebulizers and steroids and felt better although, still at 4 L. Lab work was noteworthy for an ABG the pH is 7.32, PCO2 of 63 and bicarbonate of 33. Chest x-ray was unrevealing, noting no infiltrate. Patient has a previous history of a lobectomy. Hospitals for call for further evaluation. BNP only minimally elevated at [id number]  Hospital Course  82 year old male with history of A. fib on warfarin, chronic respiratory failure on 2 L nasal cannula at baseline, presents with shortness of breath, wheezing , admitted for COPD exacerbation treatment  Acute on chronic hypoxic , and hypercarbic respiratory failure - Secondary to COPD exacerbation, on baseline 2 L nasal cannula, requiring 4 L on admission, with evidence of CO2 retention. - Appears to be improving on steroids and nebulizer treatment - improved, currently back to baseline 2-3 L nasal cannula, a shunt was instructed to resumption when ambulating as well.  COPD exacerbation - was significant wheezing on admission, currently improved with IV steroids, will be discharged on steroid taper as an outpatient, continue with him with medication including nebulizer treatment( he has machine at home), and Symbicort - given his productive cough, was treated with IV Rocephin and azithromycin during  hospital stay, no indication for antibiotics on discharge.  Obesity (BMI 30-39.9): - Patient meets criteria with BMI greater than 30  CAD in native artery  - LM, RCA, RI &Cx disease -->CABG x 4 (LIMA-LAD, SVG-OM/RI, SVG-rPDA-rPL): Stable, no evidence of acute ischemia  Paroxysmal atrial fibrillation (HCC); - CHA2DS2-VASc Score = 4. On Warfarin.   Essential hypertension:  - Stable, continue home medications  AAA (abdominal aortic aneurysm) without rupture seen on CT scan:  - Stable.  Hyperkalemia  -  continue to hold supplements .    Discharge Condition:  Stable   Follow UP  Follow-up Information    Pearson Grippe, MD Follow up in 1 week(s).   Specialty:  Internal Medicine Contact information: [address] [place] [address] [place] [address] [phone]             Discharge Instructions  and  Discharge Medications     Discharge Instructions    Diet - low sodium heart healthy    Complete by:  As directed    Discharge instructions    Complete by:  As directed    Follow with Primary MD Pearson Grippe, MD in 7 days   Get CBC, CMP, 2 view Chest X ray checked  by Primary MD next visit.    Activity: As tolerated with Full fall precautions use walker/cane & assistance as needed   Disposition Home    Diet: Heart Healthy , with feeding assistance and aspiration precautions.  For Heart failure patients - Check your Weight same time everyday, if you gain over 2 pounds, or you develop in leg swelling, experience more shortness of breath or chest pain, call your Primary MD immediately. Follow Cardiac Low Salt Diet and 1.5 lit/day fluid restriction.   On your next visit with your primary care physician please Get Medicines reviewed and adjusted.   Please request your Prim.MD to go over all Hospital Tests and Procedure/Radiological results at the follow up, please get all Hospital records sent to your Prim MD by signing hospital release before you go  home.   If you experience worsening of your admission symptoms, develop shortness of breath, life threatening emergency, suicidal or homicidal thoughts you must seek medical attention immediately by calling 911 or calling your MD immediately  if symptoms less severe.  You Must read complete instructions/literature along with all the possible adverse reactions/side effects for all the Medicines you take and that have been prescribed to you. Take any new Medicines after you have completely understood and accpet all the possible adverse reactions/side effects.   Do not drive, operating heavy machinery, perform activities at heights, swimming or participation in water activities or provide baby sitting services if your were admitted for syncope or siezures until you have seen by Primary MD or a Neurologist and advised to do so again.  Do not drive when taking Pain medications.    Do not take more than prescribed Pain, Sleep and Anxiety Medications  Special Instructions: If you have smoked or chewed Tobacco  in the last 2 yrs please stop smoking, stop any regular Alcohol  and or any Recreational drug use.  Wear Seat belts while driving.   Please note  You were cared for by a hospitalist during your hospital stay. If you have any questions about your discharge medications or the care you received while you were in the hospital after you are discharged, you can call the unit and asked to speak with the hospitalist on call if the hospitalist that took care of you is not available. Once you are discharged, your primary care physician will handle any further medical issues. Please note that NO REFILLS for any discharge medications will be authorized once you are discharged, as it is imperative that you return to your primary care physician (or establish a relationship with a primary care physician if you do not have one) for your aftercare needs so that they can reassess your need for medications and  monitor your lab values.   Increase activity slowly    Complete by:  As directed  Medication List    STOP taking these medications   KLOR-CON M20 20 MEQ tablet Generic drug:  potassium chloride SA     TAKE these medications   amLODipine 10 MG tablet Commonly known as:  NORVASC Take 1 tablet (10 mg total) by mouth daily.   aspirin EC 81 MG tablet Take 81 mg by mouth every morning.   budesonide-formoterol 160-4.5 MCG/ACT inhaler Commonly known as:  SYMBICORT Inhale 2 puffs into the lungs 2 (two) times daily. What changed:  when to take this  reasons to take this   cholecalciferol 1000 units tablet Commonly known as:  VITAMIN D Take 1,000 Units by mouth every morning.   DUONEB 0.5-2.5 (3) MG/3ML Soln Generic drug:  ipratropium-albuterol Take 3 mLs by nebulization every 6 (six) hours as needed (wheezing and shortness of breath).   furosemide 40 MG tablet Commonly known as:  LASIX TAKE ONE TABLET BY MOUTH EVERY MORNING What changed:  See the new instructions.   OXYGEN Inhale 2 L into the lungs as needed (shortness of breath).   pantoprazole 40 MG tablet Commonly known as:  PROTONIX Take 1 tablet (40 mg total) by mouth daily.   predniSONE 10 MG tablet Commonly known as:  DELTASONE Please take 50 mg oral daily for 3 days, then 40 mg oral daily for 3 days, then 30 mg oral daily for 2 days, then 20 mg oral daily for 2 days, then 10 mg oral daily for 3 days, then stop   simvastatin 40 MG tablet Commonly known as:  ZOCOR Take 20 mg by mouth every morning.   warfarin 2.5 MG tablet Commonly known as:  COUMADIN Take 1-2 tablets by mouth daily as directed by coumadin clinic What changed:  how much to take  how to take this  when to take this  additional instructions         Diet and Activity recommendation: See Discharge Instructions above   Consults obtained -  none   Major procedures and Radiology Reports - PLEASE review detailed and final  reports for all details, in brief -      Dg Chest 2 View  Result Date: 04/28/2016 CLINICAL DATA:  Dyspnea and cough. EXAM: CHEST  2 VIEW COMPARISON:  04/26/2016 CXR FINDINGS: Patient status post median sternotomy and CABG. Heart is enlarged but stable. No aortic arch aneurysm. Chronic blunting and pleural thickening at the right lung base. Diffuse mild interstitial prominence is noted which may reflect bronchitic change. Old left-sided rib fractures are identified involving the left fifth through seventh ribs. EKG leads project over the right medial hemithorax and mediastinum. IMPRESSION: Stable right basilar scarring and pleural thickening. Bilateral increased interstitial prominence of the lungs without pneumonic consolidations. Mild bronchitic change is suspected. Electronically Signed   By: Tollie Eth M.D.   On: 04/28/2016 15:58   Dg Chest 2 View  Result Date: 04/26/2016 CLINICAL DATA:  Shortness of breath, productive cough for 1 week. EXAM: CHEST  2 VIEW COMPARISON:  11/03/2015 FINDINGS: Prior CABG. Volume loss on the right with probable scarring at the right base. Postoperative changes on the right. No confluent airspace opacities. Heart is borderline in size. No acute bony abnormality. IMPRESSION: Postoperative changes and volume loss on the right with stable chronic changes of the right lung base. No acute findings. Electronically Signed   By: Charlett Nose M.D.   On: 04/26/2016 13:09    Micro Results    No results found for this or any previous visit (from  the past 240 hour(s)).     Today   Subjective:   Roberto Reilly today has no headache,no chest or abdominal pain,no new weakness tingling or numbness, feels much better wants to go home today.   Objective:   Blood pressure (!) 123/58, pulse 75, temperature 97.9 F (36.6 C), temperature source Axillary, resp. rate 17, height 6' (1.829 m), weight 98.9 kg (218 lb), SpO2 96 %.   Intake/Output Summary (Last 24 hours) at  04/30/16 1008 Last data filed at 04/30/16 0600  Gross per 24 hour  Intake             1020 ml  Output              725 ml  Net              295 ml    Exam Awake Alert, Oriented x 3, No new F.N deficits, Normal affect Shelly.AT,PERRAL Supple Neck,No JVD, No cervical lymphadenopathy appriciated.  Symmetrical Chest wall movement, Good air movement bilaterally, Scattered wheezing, but has significantly subsided RRR,No Gallops,Rubs or new Murmurs, No Parasternal Heave +ve B.Sounds, Abd Soft, Non tender, No organomegaly appriciated, No rebound -guarding or rigidity. No Cyanosis, Clubbing or edema, No new Rash or bruise  Data Review   CBC w Diff: Lab Results  Component Value Date   WBC 13.6 (H) 04/29/2016   HGB 13.0 04/29/2016   HCT 42.5 04/29/2016   PLT 240 04/29/2016   LYMPHOPCT 3 11/01/2015   MONOPCT 16 11/01/2015   EOSPCT 0 11/01/2015   BASOPCT 0 11/01/2015    CMP: Lab Results  Component Value Date   NA 141 04/29/2016   K 4.6 04/29/2016   CL 97 (L) 04/29/2016   CO2 38 (H) 04/29/2016   BUN 46 (H) 04/29/2016   CREATININE 1.29 (H) 04/29/2016   PROT 6.6 11/02/2015   ALBUMIN 3.0 (L) 11/02/2015   BILITOT 0.8 11/02/2015   ALKPHOS 63 11/02/2015   AST 16 11/02/2015   ALT 13 (L) 11/02/2015  .   Total Time in preparing paper work, data evaluation and todays exam - 35 minutes  Monasia Lair M.D on 04/30/2016 at 10:08 AM  [facility]   Office  [phone]

## 2016-05-05 DIAGNOSIS — Z7901 Long term (current) use of anticoagulants: Secondary | ICD-10-CM | POA: Diagnosis not present

## 2016-05-05 DIAGNOSIS — Z09 Encounter for follow-up examination after completed treatment for conditions other than malignant neoplasm: Secondary | ICD-10-CM | POA: Diagnosis not present

## 2016-05-05 DIAGNOSIS — Z0000 Encounter for general adult medical examination without abnormal findings: Secondary | ICD-10-CM | POA: Diagnosis not present

## 2016-05-05 DIAGNOSIS — E785 Hyperlipidemia, unspecified: Secondary | ICD-10-CM | POA: Diagnosis not present

## 2016-05-05 DIAGNOSIS — I159 Secondary hypertension, unspecified: Secondary | ICD-10-CM | POA: Diagnosis not present

## 2016-05-18 ENCOUNTER — Emergency Department (HOSPITAL_COMMUNITY): Payer: Medicare Other

## 2016-05-18 ENCOUNTER — Inpatient Hospital Stay (HOSPITAL_COMMUNITY)
Admission: EM | Admit: 2016-05-18 | Discharge: 2016-05-22 | DRG: 190 | Disposition: A | Discharge status: Home / Self Care | Payer: Medicare Other | Attending: Internal Medicine | Admitting: Internal Medicine

## 2016-05-18 ENCOUNTER — Encounter (HOSPITAL_COMMUNITY): Payer: Self-pay | Admitting: Emergency Medicine

## 2016-05-18 DIAGNOSIS — I252 Old myocardial infarction: Secondary | ICD-10-CM

## 2016-05-18 DIAGNOSIS — E669 Obesity, unspecified: Secondary | ICD-10-CM | POA: Diagnosis present

## 2016-05-18 DIAGNOSIS — R0602 Shortness of breath: Secondary | ICD-10-CM

## 2016-05-18 DIAGNOSIS — J441 Chronic obstructive pulmonary disease with (acute) exacerbation: Secondary | ICD-10-CM | POA: Diagnosis not present

## 2016-05-18 DIAGNOSIS — E875 Hyperkalemia: Secondary | ICD-10-CM | POA: Diagnosis present

## 2016-05-18 DIAGNOSIS — J9621 Acute and chronic respiratory failure with hypoxia: Secondary | ICD-10-CM | POA: Diagnosis present

## 2016-05-18 DIAGNOSIS — Z9849 Cataract extraction status, unspecified eye: Secondary | ICD-10-CM | POA: Diagnosis not present

## 2016-05-18 DIAGNOSIS — Z66 Do not resuscitate: Secondary | ICD-10-CM | POA: Diagnosis present

## 2016-05-18 DIAGNOSIS — R0682 Tachypnea, not elsewhere classified: Secondary | ICD-10-CM | POA: Diagnosis not present

## 2016-05-18 DIAGNOSIS — Z951 Presence of aortocoronary bypass graft: Secondary | ICD-10-CM | POA: Diagnosis not present

## 2016-05-18 DIAGNOSIS — I509 Heart failure, unspecified: Secondary | ICD-10-CM | POA: Diagnosis present

## 2016-05-18 DIAGNOSIS — R0689 Other abnormalities of breathing: Secondary | ICD-10-CM | POA: Diagnosis present

## 2016-05-18 DIAGNOSIS — I2510 Atherosclerotic heart disease of native coronary artery without angina pectoris: Secondary | ICD-10-CM | POA: Diagnosis present

## 2016-05-18 DIAGNOSIS — Z89431 Acquired absence of right foot: Secondary | ICD-10-CM

## 2016-05-18 DIAGNOSIS — N183 Chronic kidney disease, stage 3 unspecified: Secondary | ICD-10-CM | POA: Diagnosis present

## 2016-05-18 DIAGNOSIS — B974 Respiratory syncytial virus as the cause of diseases classified elsewhere: Secondary | ICD-10-CM | POA: Diagnosis present

## 2016-05-18 DIAGNOSIS — J209 Acute bronchitis, unspecified: Secondary | ICD-10-CM | POA: Diagnosis present

## 2016-05-18 DIAGNOSIS — J9622 Acute and chronic respiratory failure with hypercapnia: Secondary | ICD-10-CM | POA: Diagnosis present

## 2016-05-18 DIAGNOSIS — E785 Hyperlipidemia, unspecified: Secondary | ICD-10-CM | POA: Diagnosis present

## 2016-05-18 DIAGNOSIS — Z87891 Personal history of nicotine dependence: Secondary | ICD-10-CM

## 2016-05-18 DIAGNOSIS — Z9889 Other specified postprocedural states: Secondary | ICD-10-CM

## 2016-05-18 DIAGNOSIS — I10 Essential (primary) hypertension: Secondary | ICD-10-CM | POA: Diagnosis present

## 2016-05-18 DIAGNOSIS — I130 Hypertensive heart and chronic kidney disease with heart failure and stage 1 through stage 4 chronic kidney disease, or unspecified chronic kidney disease: Secondary | ICD-10-CM | POA: Diagnosis present

## 2016-05-18 DIAGNOSIS — I480 Paroxysmal atrial fibrillation: Secondary | ICD-10-CM | POA: Diagnosis present

## 2016-05-18 DIAGNOSIS — Z7982 Long term (current) use of aspirin: Secondary | ICD-10-CM

## 2016-05-18 DIAGNOSIS — J9690 Respiratory failure, unspecified, unspecified whether with hypoxia or hypercapnia: Secondary | ICD-10-CM | POA: Diagnosis not present

## 2016-05-18 DIAGNOSIS — Z9981 Dependence on supplemental oxygen: Secondary | ICD-10-CM

## 2016-05-18 DIAGNOSIS — J440 Chronic obstructive pulmonary disease with acute lower respiratory infection: Principal | ICD-10-CM | POA: Diagnosis present

## 2016-05-18 DIAGNOSIS — Z7901 Long term (current) use of anticoagulants: Secondary | ICD-10-CM | POA: Diagnosis not present

## 2016-05-18 DIAGNOSIS — J9620 Acute and chronic respiratory failure, unspecified whether with hypoxia or hypercapnia: Secondary | ICD-10-CM | POA: Diagnosis present

## 2016-05-18 DIAGNOSIS — Z8249 Family history of ischemic heart disease and other diseases of the circulatory system: Secondary | ICD-10-CM | POA: Diagnosis not present

## 2016-05-18 DIAGNOSIS — Z8701 Personal history of pneumonia (recurrent): Secondary | ICD-10-CM

## 2016-05-18 DIAGNOSIS — R739 Hyperglycemia, unspecified: Secondary | ICD-10-CM | POA: Diagnosis present

## 2016-05-18 DIAGNOSIS — J205 Acute bronchitis due to respiratory syncytial virus: Secondary | ICD-10-CM

## 2016-05-18 DIAGNOSIS — Z79899 Other long term (current) drug therapy: Secondary | ICD-10-CM

## 2016-05-18 DIAGNOSIS — Z6830 Body mass index (BMI) 30.0-30.9, adult: Secondary | ICD-10-CM

## 2016-05-18 LAB — BLOOD GAS, ARTERIAL
ACID-BASE EXCESS: 4.1 mmol/L — AB (ref 0.0–2.0)
Acid-Base Excess: 4 mmol/L — ABNORMAL HIGH (ref 0.0–2.0)
BICARBONATE: 33.2 mmol/L — AB (ref 20.0–28.0)
Bicarbonate: 32.1 mmol/L — ABNORMAL HIGH (ref 20.0–28.0)
DELIVERY SYSTEMS: POSITIVE
DELIVERY SYSTEMS: POSITIVE
Drawn by: 422461
Expiratory PAP: 8
Expiratory PAP: 8
FIO2: 0.35
FIO2: 35
INSPIRATORY PAP: 16
Inspiratory PAP: 16
LHR: 12 {breaths}/min
MODE: POSITIVE
Mode: POSITIVE
O2 Saturation: 94.4 %
O2 Saturation: 93.7 %
PCO2 ART: 75.8 mmHg — AB (ref 32.0–48.0)
PH ART: 7.304 — AB (ref 7.350–7.450)
PO2 ART: 74.4 mmHg — AB (ref 83.0–108.0)
Patient temperature: 98.6
Patient temperature: 98.6
pCO2 arterial: 66.7 mmHg (ref 32.0–48.0)
pH, Arterial: 7.264 — ABNORMAL LOW (ref 7.350–7.450)
pO2, Arterial: 73.8 mmHg — ABNORMAL LOW (ref 83.0–108.0)

## 2016-05-18 LAB — BASIC METABOLIC PANEL
ANION GAP: 8 (ref 5–15)
BUN: 27 mg/dL — ABNORMAL HIGH (ref 6–20)
CHLORIDE: 101 mmol/L (ref 101–111)
CO2: 32 mmol/L (ref 22–32)
Calcium: 8.1 mg/dL — ABNORMAL LOW (ref 8.9–10.3)
Creatinine, Ser: 1.28 mg/dL — ABNORMAL HIGH (ref 0.61–1.24)
GFR calc Af Amer: 59 mL/min — ABNORMAL LOW (ref >60)
GFR, EST NON AFRICAN AMERICAN: 51 mL/min — AB (ref >60)
Glucose, Bld: 129 mg/dL — ABNORMAL HIGH (ref 65–99)
POTASSIUM: 4.4 mmol/L (ref 3.5–5.1)
SODIUM: 141 mmol/L (ref 135–145)

## 2016-05-18 LAB — BRAIN NATRIURETIC PEPTIDE: B NATRIURETIC PEPTIDE 5: 249.8 pg/mL — AB (ref 0.0–100.0)

## 2016-05-18 LAB — HEPATIC FUNCTION PANEL
ALBUMIN: 4.2 g/dL (ref 3.5–5.0)
ALT: 19 U/L (ref 17–63)
AST: 25 U/L (ref 15–41)
Alkaline Phosphatase: 61 U/L (ref 38–126)
BILIRUBIN DIRECT: 0.1 mg/dL (ref 0.1–0.5)
Indirect Bilirubin: 0.5 mg/dL (ref 0.3–0.9)
Total Bilirubin: 0.6 mg/dL (ref 0.3–1.2)
Total Protein: 7 g/dL (ref 6.5–8.1)

## 2016-05-18 LAB — CBC
HCT: 47.7 % (ref 39.0–52.0)
Hemoglobin: 14.7 g/dL (ref 13.0–17.0)
MCH: 28.8 pg (ref 26.0–34.0)
MCHC: 30.8 g/dL (ref 30.0–36.0)
MCV: 93.3 fL (ref 78.0–100.0)
PLATELETS: 115 10*3/uL — AB (ref 150–400)
RBC: 5.11 MIL/uL (ref 4.22–5.81)
RDW: 16.3 % — AB (ref 11.5–15.5)
WBC: 7.6 10*3/uL (ref 4.0–10.5)

## 2016-05-18 LAB — I-STAT TROPONIN, ED: TROPONIN I, POC: 0.04 ng/mL (ref 0.00–0.08)

## 2016-05-18 LAB — PHOSPHORUS: PHOSPHORUS: 3.5 mg/dL (ref 2.5–4.6)

## 2016-05-18 LAB — MAGNESIUM: Magnesium: 2.5 mg/dL — ABNORMAL HIGH (ref 1.7–2.4)

## 2016-05-18 LAB — PROTIME-INR
INR: 2.05
PROTHROMBIN TIME: 23.4 s — AB (ref 11.4–15.2)

## 2016-05-18 MED ORDER — WARFARIN - PHARMACIST DOSING INPATIENT: Freq: Every day | Status: DC

## 2016-05-18 MED ORDER — ASPIRIN EC 81 MG PO TBEC
81.0000 mg | DELAYED_RELEASE_TABLET | Freq: Every day | ORAL | Status: DC
  Administered 2016-05-19 – 2016-05-22 (×4): 81 mg via ORAL
  Filled 2016-05-18 (×4): qty 1

## 2016-05-18 MED ORDER — FUROSEMIDE 40 MG PO TABS
40.0000 mg | ORAL_TABLET | Freq: Every morning | ORAL | Status: DC
  Administered 2016-05-19 – 2016-05-22 (×4): 40 mg via ORAL
  Filled 2016-05-18 (×4): qty 1

## 2016-05-18 MED ORDER — POTASSIUM CHLORIDE CRYS ER 20 MEQ PO TBCR
20.0000 meq | EXTENDED_RELEASE_TABLET | Freq: Every day | ORAL | Status: DC
  Administered 2016-05-18: 20 meq via ORAL
  Filled 2016-05-18: qty 1

## 2016-05-18 MED ORDER — METHYLPREDNISOLONE SODIUM SUCC 125 MG IJ SOLR
60.0000 mg | Freq: Three times a day (TID) | INTRAMUSCULAR | Status: DC
  Administered 2016-05-18 – 2016-05-22 (×11): 60 mg via INTRAVENOUS
  Filled 2016-05-18 (×11): qty 2

## 2016-05-18 MED ORDER — DOXYCYCLINE HYCLATE 100 MG PO TABS
100.0000 mg | ORAL_TABLET | Freq: Two times a day (BID) | ORAL | Status: DC
  Administered 2016-05-18 – 2016-05-20 (×4): 100 mg via ORAL
  Filled 2016-05-18 (×4): qty 1

## 2016-05-18 MED ORDER — ARFORMOTEROL TARTRATE 15 MCG/2ML IN NEBU
15.0000 ug | INHALATION_SOLUTION | Freq: Two times a day (BID) | RESPIRATORY_TRACT | Status: DC
  Administered 2016-05-19 – 2016-05-22 (×7): 15 ug via RESPIRATORY_TRACT
  Filled 2016-05-18 (×9): qty 2

## 2016-05-18 MED ORDER — ALBUTEROL SULFATE (2.5 MG/3ML) 0.083% IN NEBU
5.0000 mg | INHALATION_SOLUTION | Freq: Once | RESPIRATORY_TRACT | Status: AC
  Administered 2016-05-18: 5 mg via RESPIRATORY_TRACT
  Filled 2016-05-18: qty 6

## 2016-05-18 MED ORDER — BUDESONIDE 0.25 MG/2ML IN SUSP
0.2500 mg | Freq: Two times a day (BID) | RESPIRATORY_TRACT | Status: DC
  Administered 2016-05-18 – 2016-05-20 (×4): 0.25 mg via RESPIRATORY_TRACT
  Filled 2016-05-18 (×4): qty 2

## 2016-05-18 MED ORDER — ACETAMINOPHEN 325 MG PO TABS
650.0000 mg | ORAL_TABLET | Freq: Four times a day (QID) | ORAL | Status: DC | PRN
Start: 2016-05-18 — End: 2016-05-22

## 2016-05-18 MED ORDER — ALBUTEROL SULFATE (2.5 MG/3ML) 0.083% IN NEBU: 2.5000 mg | INHALATION_SOLUTION | RESPIRATORY_TRACT | Status: DC | PRN

## 2016-05-18 MED ORDER — SODIUM CHLORIDE 0.9% FLUSH
3.0000 mL | INTRAVENOUS | Status: DC | PRN
  Administered 2016-05-19: 3 mL via INTRAVENOUS

## 2016-05-18 MED ORDER — SIMVASTATIN 20 MG PO TABS
20.0000 mg | ORAL_TABLET | Freq: Every day | ORAL | Status: DC
  Administered 2016-05-19 – 2016-05-21 (×3): 20 mg via ORAL
  Filled 2016-05-18 (×4): qty 1

## 2016-05-18 MED ORDER — IPRATROPIUM-ALBUTEROL 0.5-2.5 (3) MG/3ML IN SOLN
3.0000 mL | Freq: Four times a day (QID) | RESPIRATORY_TRACT | Status: DC
  Administered 2016-05-18 – 2016-05-20 (×9): 3 mL via RESPIRATORY_TRACT
  Filled 2016-05-18 (×9): qty 3

## 2016-05-18 MED ORDER — AMLODIPINE BESYLATE 10 MG PO TABS
10.0000 mg | ORAL_TABLET | Freq: Every day | ORAL | Status: DC
  Administered 2016-05-19 – 2016-05-22 (×4): 10 mg via ORAL
  Filled 2016-05-18 (×2): qty 1
  Filled 2016-05-18: qty 2
  Filled 2016-05-18: qty 1

## 2016-05-18 MED ORDER — ONDANSETRON HCL 4 MG/2ML IJ SOLN: 4.0000 mg | Freq: Four times a day (QID) | INTRAMUSCULAR | Status: DC | PRN

## 2016-05-18 MED ORDER — VITAMIN D3 25 MCG (1000 UNIT) PO TABS
1000.0000 [IU] | ORAL_TABLET | Freq: Every day | ORAL | Status: DC
  Administered 2016-05-19 – 2016-05-22 (×4): 1000 [IU] via ORAL
  Filled 2016-05-18 (×4): qty 1

## 2016-05-18 MED ORDER — SODIUM CHLORIDE 0.9 % IV SOLN: 250.0000 mL | INTRAVENOUS | Status: DC | PRN

## 2016-05-18 MED ORDER — SODIUM CHLORIDE 0.9% FLUSH
3.0000 mL | Freq: Two times a day (BID) | INTRAVENOUS | Status: DC
  Administered 2016-05-18 – 2016-05-22 (×8): 3 mL via INTRAVENOUS

## 2016-05-18 MED ORDER — ONDANSETRON HCL 4 MG PO TABS: 4.0000 mg | ORAL_TABLET | Freq: Four times a day (QID) | ORAL | Status: DC | PRN

## 2016-05-18 MED ORDER — PANTOPRAZOLE SODIUM 40 MG PO TBEC
40.0000 mg | DELAYED_RELEASE_TABLET | Freq: Every day | ORAL | Status: DC
  Administered 2016-05-18 – 2016-05-22 (×5): 40 mg via ORAL
  Filled 2016-05-18 (×5): qty 1

## 2016-05-18 MED ORDER — ACETAMINOPHEN 650 MG RE SUPP: 650.0000 mg | Freq: Four times a day (QID) | RECTAL | Status: DC | PRN

## 2016-05-18 NOTE — ED Provider Notes (Signed)
[facility]-EMERGENCY DEPT Provider Note   CSN: [id number] Arrival date & time: 05/18/16  1122     History   Chief Complaint Chief Complaint  Patient presents with  . Shortness of Breath    HPI Roberto Reilly is a 82 y.o. male  Patient is an 82 year old male with history of COPD, CABG, obesity, right lower lobe resection, paroxysmal atrial fibrillation for hemartoma brought in by EMS for complaints of worsening shortness of breath and wheezing since earlier this morning. Patient states that he oxygen at home which she only uses when he needs it. He said today he uses the oxygen and albuterol which normally helps and it did not help his symptoms. Patient was recently admitted on December 5 for similar symptoms. Patient denied cough or recent URI. Patient states that his symptoms are similar to previous visit and felt more like a COPD exacerbation. Patient is normally on oxygen at home. Patient denied any fever, chills, nausea, vomiting, chest pain.      Shortness of Breath  Associated symptoms include wheezing. Pertinent negatives include no fever, no sore throat, no cough, no chest pain, no vomiting and no abdominal pain.    Past Medical History:  Diagnosis Date  . AAA (abdominal aortic aneurysm) (HCC)    4.2 X 4.8 cm 04/2013 CT; declined re-eval 02/25/15  . Anxiety   . CAD in native artery March 2007   Cath for dyspnea on exertion: 75% distal LM, 85% RI, 95% mid-distal Cx, in multiple RCA lesions with 95% distal. --> Referred for CABG; has declined further noninvasive evaluation in the absence of worsening symptoms  . COPD (chronic obstructive pulmonary disease) with emphysema (HCC) 05/23/2007   Hosp 5/29-6/01/12- COPD exacerbation ONOX 12/08/10- desaturated to less than 88% for over an hour, qualifying for home O2 during sleep    . Diverticulosis of colon with hemorrhage 05/24/2013  . Dyslipidemia, goal LDL below 70   . H/O echocardiogram March ; January 2015   a) Normal LV size and  cavity appeared normal function. Grade 1 diastolic dysfunction. Limited study.; b) normal LV size and function with EF 60-65%. Aortic sclerosis without stenosis, mildly increased PA pressures of 44mmHg but normal IVC and RA/RV size  . History of non-ST Elevation MI (myocardial infarction) March 2007   Admitted with COPD exacerbation, given by CHF. Had some chest pain with positive troponins. Cath showed multivessel disease, referred for CABG  . History of pneumonia   . Hypertension   . Hypoxia     chronic, on home O2  . Myocardial infarction   . Obesity (BMI 30-39.9)    One year ago weighed 265 pounds, (12/2012) -- now 234 pounds  . PAF (paroxysmal atrial fibrillation) (HCC)    Anticoagulated on warfarin. Stable -- post op  . Pneumonia    hx  . 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 Seasonal allergies   . Shortness of breath dyspnea     Patient Active Problem List   Diagnosis Date Noted  . Shortness of breath 05/18/2016  . Chronic osteomyelitis of right foot (HCC) 02/05/2016  . Acute and chronic respiratory failure with hypoxia (HCC)   . Acute on chronic respiratory failure (HCC) 11/03/2015  . Abnormal TSH 11/03/2015  . Pleural effusion 11/01/2015  . Elevated troponin 11/01/2015  . Acute renal failure (ARF) (HCC) 11/01/2015  . Sepsis (HCC) 11/01/2015  . Pneumonia 11/01/2015  . COPD exacerbation (HCC) 11/01/2015  . Encounter for long-term (current)  use of other medications 01/07/2014  . Nasal sinus congestion 01/07/2014  . Diverticulosis of colon with hemorrhage 05/24/2013  . Internal hemorrhoids 05/24/2013  . AAA (abdominal aortic aneurysm) without rupture seen on CT scan 05/22/2013  . Rectal bleeding 05/21/2013  . Anemia 05/20/2013  . Generalized anxiety disorder 05/09/2013  . Auditory hallucination 05/09/2013  . COPD mixed type (HCC) 05/07/2013  . COPD with acute exacerbation (HCC) 05/07/2013  . Chest discomfort 05/07/2013  . Hx of scabies  04/29/2013  . Essential hypertension 04/29/2013  . Obesity (BMI 30-39.9) 01/18/2013  . Paroxysmal atrial fibrillation (HCC); CHA2DS2-VASc Score = 4. On Warfarin   . Dyslipidemia, goal LDL below 70   . Chronic anticoagulation 08/09/2012  . Reactive depression (situational) 09/06/2011  . CAD 05/23/2007  . CAD in native artery - LM, RCA, RI & Cx disease --> CABG x 4 (LIMA-LAD, SVG-OM/RI, SVG-rPDA-rPL) 07/21/2005    Class: History of  . S/P CABG x 4 07/21/2005    Past Surgical History:  Procedure Laterality Date  . ACHILLES TENDON LENGTHENING Right 10/08/2015   Procedure: ACHILLES TENDON LENGTHENING;  Surgeon: Toni Arthurs, MD;  Location: [facility] OR;  Service: Orthopedics;  Laterality: Right;  . AMPUTATION Right 10/08/2015   Procedure: RIGHT FOOT TRANSMET AMPUTATION;  Surgeon: Toni Arthurs, MD;  Location: [facility] OR;  Service: Orthopedics;  Laterality: Right;  . CARDIAC CATHETERIZATION  03 28 2007   NORMAL LV FUNCTION/ ABDOMINAL AORTA STENOSIS,75%-85%. RIGHT FEMORAL ARTERY :CATHETERS USED A  4-FRENCH WITH A 4-FRENCH SHEATH  . CATARACT EXTRACTION     Laser  . COLONOSCOPY N/A 05/24/2013   Procedure: COLONOSCOPY;  Surgeon: Iva Boop, MD;  Location: [facility] ENDOSCOPY;  Service: Endoscopy;  Laterality: N/A;  . CORONARY ARTERY BYPASS GRAFT    . RLL resection for Hamartoma     lungs  . RUL for hamartoma     lung       Home Medications    Prior to Admission medications   Medication Sig Start Date End Date Taking? Authorizing Provider  amLODipine (NORVASC) 10 MG tablet Take 1 tablet (10 mg total) by mouth daily. 05/01/13   Dorothea Ogle, MD  aspirin EC 81 MG tablet Take 81 mg by mouth every morning.     Historical Provider, MD  budesonide-formoterol (SYMBICORT) 160-4.5 MCG/ACT inhaler Inhale 2 puffs into the lungs 2 (two) times daily. 04/29/16   Leana Roe Elgergawy, MD  cholecalciferol (VITAMIN D) 1000 UNITS tablet Take 1,000 Units by mouth every morning.     Historical Provider, MD  furosemide (LASIX)  40 MG tablet TAKE ONE TABLET BY MOUTH EVERY MORNING Patient taking differently: TAKE 40  MG BY MOUTH EVERY MORNING 02/08/16   Marykay Lex, MD  ipratropium-albuterol (DUONEB) 0.5-2.5 (3) MG/3ML SOLN Take 3 mLs by nebulization every 6 (six) hours as needed (wheezing and shortness of breath).     Historical Provider, MD  OXYGEN Inhale 2 L into the lungs as needed (shortness of breath).     Historical Provider, MD  pantoprazole (PROTONIX) 40 MG tablet Take 1 tablet (40 mg total) by mouth daily. 04/30/16   Leana Roe Elgergawy, MD  potassium chloride SA (K-DUR,KLOR-CON) 20 MEQ tablet Take 20 mEq by mouth daily. 03/28/16   Historical Provider, MD  predniSONE (DELTASONE) 10 MG tablet Please take 50 mg oral daily for 3 days, then 40 mg oral daily for 3 days, then 30 mg oral daily for 2 days, then 20 mg oral daily for 2 days, then 10 mg oral  daily for 3 days, then stop 04/29/16   Starleen Arms, MD  simvastatin (ZOCOR) 40 MG tablet Take 20 mg by mouth every morning.     Historical Provider, MD  warfarin (COUMADIN) 2.5 MG tablet Take 1-2 tablets by mouth daily as directed by coumadin clinic Patient taking differently: Take 2.5-5 mg by mouth daily. Take 5 mg on MWF& SAT and take 2.5 mg on all other days 03/04/16   Marykay Lex, MD    Family History Family History  Problem Relation Age of Onset  . Heart attack Mother   . Heart attack Father     Social History Social History  Substance Use Topics  . Smoking status: Former Smoker    Packs/day: 2.00    Years: 55.00    Types: Cigarettes    Quit date: 05/23/1998  . Smokeless tobacco: Never Used  . Alcohol use No     Allergies   Prednisone   Review of Systems Review of Systems  Unable to perform ROS: Acuity of condition  Constitutional: Negative for chills and fever.  HENT: Negative for congestion, sore throat and trouble swallowing.   Respiratory: Positive for shortness of breath and wheezing. Negative for cough.   Cardiovascular:  Negative for chest pain.  Gastrointestinal: Negative for abdominal pain, nausea and vomiting.  Genitourinary: Negative for difficulty urinating.  Musculoskeletal: Negative for back pain.  Skin: Negative for wound.  Neurological: Positive for weakness.  Psychiatric/Behavioral: Negative for agitation.     Physical Exam Updated Vital Signs BP 189/84   Pulse 87   Temp 99.5 F (37.5 C) (Oral)   Resp 22   SpO2 94%   Physical Exam  Constitutional: He is oriented to person, place, and time. He appears well-developed and well-nourished.  HENT:  Head: Normocephalic and atraumatic.  Nose: Nose normal.  Eyes: Conjunctivae and EOM are normal. Pupils are equal, round, and reactive to light.  Neck: Normal range of motion. Neck supple.  Cardiovascular: Normal rate.   Pulmonary/Chest: Accessory muscle usage present. Tachypnea noted. He is in respiratory distress. He has wheezes.  On BiPAP  Abdominal: Soft.  Neurological: He is alert and oriented to person, place, and time.  Skin: Skin is warm.  Psychiatric: He has a normal mood and affect. His behavior is normal.  Nursing note and vitals reviewed.    ED Treatments / Results  Labs (all labs ordered are listed, but only abnormal results are displayed) Labs Reviewed  BASIC METABOLIC PANEL - Abnormal; Notable for the following:       Result Value   Glucose, Bld 129 (*)    BUN 27 (*)    Creatinine, Ser 1.28 (*)    Calcium 8.1 (*)    GFR calc non Af Amer 51 (*)    GFR calc Af Amer 59 (*)    All other components within normal limits  BRAIN NATRIURETIC PEPTIDE - Abnormal; Notable for the following:    B Natriuretic Peptide 249.8 (*)    All other components within normal limits  CBC - Abnormal; Notable for the following:    RDW 16.3 (*)    Platelets 115 (*)    All other components within normal limits  PROTIME-INR - Abnormal; Notable for the following:    Prothrombin Time 23.4 (*)    All other components within normal limits  BLOOD  GAS, ARTERIAL - Abnormal; Notable for the following:    pH, Arterial 7.304 (*)    pCO2 arterial 66.7 (*)  pO2, Arterial 73.8 (*)    Bicarbonate 32.1 (*)    Acid-Base Excess 4.1 (*)    All other components within normal limits  I-STAT TROPOININ, ED    EKG  EKG Interpretation  Date/Time:  Wednesday May 18 2016 11:45:45 EST Ventricular Rate:  91 PR Interval:    QRS Duration: 138 QT Interval:  363 QTC Calculation: 447 R Axis:   86 Text Interpretation:  Sinus rhythm Atrial premature complex Probable left atrial enlargement Right bundle branch block No significant change since last tracing Confirmed by Rubin PayorPICKERING  MD, NATHAN [phone]([id number]) on 05/18/2016 1:55:25 PM       Radiology Dg Chest Portable 1 View  Result Date: 05/18/2016 CLINICAL DATA:  Shortness of breath and wheezing. COPD exacerbation. EXAM: PORTABLE CHEST 1 VIEW COMPARISON:  04/28/2016 FINDINGS: The heart size and pulmonary vascularity are normal. There is chronic blunting of the right costophrenic angle. Multiple old bilateral rib fractures. CABG. IMPRESSION: No acute abnormalities.  Chronic changes as described. Electronically Signed   By: Francene BoyersJames  Maxwell M.D.   On: 05/18/2016 13:32    Procedures Procedures (including critical care time)  Medications Ordered in ED Medications  albuterol (PROVENTIL) (2.5 MG/3ML) 0.083% nebulizer solution 5 mg (5 mg Nebulization Given 05/18/16 1155)     Initial Impression / Assessment and Plan / ED Course  I have reviewed the triage vital signs and the nursing notes.  Pertinent labs & imaging results that were available during my care of the patient were reviewed by me and considered in my medical decision making (see chart for details).  Clinical Course   Patient presents patient is a 82 year old male presenting to the ED by EMS for shortness of breath. Patient recently here for same complaint. Patient first put on 3 L of oxygen with increased work of breathing. Patient was then  put on BiPAP. On exam patient is hypertensive, temperature of 99.5, tachypneic. Patient looks uncomfortable. Heart sounds are clear. Lung sounds are tight with expiratory wheezeing. Abdomen benign. Lab work looks similar or slightly better than previous experience. Chest x-ray shows no acute changes. EKG shows no acute changes.  Dr. Rubin PayorPickering also saw and evaluated patient.    15:25 respiratory therapist challenged patient with BiPAP off and on nasal cannula. Patient still using accessory muscles and satting at 80% on 4 L of O2. Patient put back on BiPAP.   I spoke with Dr. Gwenlyn PerkingMadera who agreed to admit patient for step down.   Final Clinical Impressions(s) / ED Diagnoses   Final diagnoses:  Shortness of breath    New Prescriptions New Prescriptions   No medications on file     [address]Franc[address] Manuel FairfaxEspina, [place] 05/18/16 1654    Benjiman CoreNathan Pickering, MD 05/20/16 1453

## 2016-05-18 NOTE — ED Notes (Signed)
Patient is requesting food and water. Provider gave permission for him to drink fluids. Consulted Angie, RT when she obtains an ABG, we can provide patient water. Will inform patient plan of care.

## 2016-05-18 NOTE — ED Notes (Signed)
Patient has removed bi-pap once again, stating he wanted "the old on". Notified Daniel, RT that patient has discontinued himself from bi-pap. Applied oxygen via [place] at 4L.

## 2016-05-18 NOTE — ED Notes (Signed)
Respiratory at bedside.

## 2016-05-18 NOTE — Progress Notes (Signed)
ANTICOAGULATION CONSULT NOTE - Initial Consult  Pharmacy Consult for warfarin Indication: atrial fibrillation  Allergies  Allergen Reactions  . Prednisone Other (See Comments)    Makes pt jittery and nervous    Patient Measurements:    Vital Signs: Temp: 99.5 F (37.5 C) (12/27 1134) Temp Source: Oral (12/27 1134) BP: 144/88 (12/27 1932) Pulse Rate: 73 (12/27 1932)  Labs:  Recent Labs  05/18/16 1130 05/18/16 1209  HGB  --  14.7  HCT  --  47.7  PLT  --  115*  LABPROT 23.4*  --   INR 2.05  --   CREATININE  --  1.28*    CrCl cannot be calculated (Unknown ideal weight.).   Medical History: Past Medical History:  Diagnosis Date  . AAA (abdominal aortic aneurysm) (HCC)    4.2 X 4.8 cm 04/2013 CT; declined re-eval 02/25/15  . Anxiety   . CAD in native artery March 2007   Cath for dyspnea on exertion: 75% distal LM, 85% RI, 95% mid-distal Cx, in multiple RCA lesions with 95% distal. --> Referred for CABG; has declined further noninvasive evaluation in the absence of worsening symptoms  . COPD (chronic obstructive pulmonary disease) with emphysema (HCC) 05/23/2007   Hosp 5/29-6/01/12- COPD exacerbation ONOX 12/08/10- desaturated to less than 88% for over an hour, qualifying for home O2 during sleep    . Diverticulosis of colon with hemorrhage 05/24/2013  . Dyslipidemia, goal LDL below 70   . H/O echocardiogram March ; January 2015   a) Normal LV size and cavity appeared normal function. Grade 1 diastolic dysfunction. Limited study.; b) normal LV size and function with EF 60-65%. Aortic sclerosis without stenosis, mildly increased PA pressures of 44mmHg but normal IVC and RA/RV size  . History of non-ST Elevation MI (myocardial infarction) March 2007   Admitted with COPD exacerbation, given by CHF. Had some chest pain with positive troponins. Cath showed multivessel disease, referred for CABG  . History of pneumonia   . Hypertension   . Hypoxia     chronic, on home O2  .  Myocardial infarction   . Obesity (BMI 30-39.9)    One year ago weighed 265 pounds, (12/2012) -- now 234 pounds  . PAF (paroxysmal atrial fibrillation) (HCC)    Anticoagulated on warfarin. Stable -- post op  . Pneumonia    hx  . 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 Seasonal allergies   . Shortness of breath dyspnea      Assessment: 82 y.o. male with PMH significant for chronic resp failure (usually 2-3L of oxygen at baseline), CKD stage 3, HTN, HLD, CAD S/P CABG, Obesity and PAF (on coumadin chronically); who presented with worsening of SOB and increase wheezing. Admitted for acute on chronic respiratory failure. Pharmacy consulted to manage warfarin.  Warfarin dose per anticoag clinic notes: 5mg  on M,W,F & Sat, 2.5mg  on T,TH,Sun  Today, 05/18/2016: - INR 2.05 - Hgb wnl, plts 115  Goal of Therapy:  INR 2-3   Plan:  No warfarin tonight since patient reported taking his 5 mg dose this morning. Daily INR.  Roberto Reilly, Roberto Reilly 05/18/2016,8:07 PM

## 2016-05-18 NOTE — ED Notes (Signed)
Respiratory called

## 2016-05-18 NOTE — ED Triage Notes (Addendum)
Per EMS, patient from home c/o COPD exacerbation with SOB and wheezing. Recently admitted for same, states he "never felt better after discharge". Denies chest pain. Breathing treatment with EMS.

## 2016-05-18 NOTE — ED Notes (Signed)
Patients admitting orders have been released due to he will wait for an assigned bed. Notified pharmacy to verify all orders for patient. Also, informed Reuel BoomDaniel, RT of this information.

## 2016-05-18 NOTE — ED Notes (Addendum)
Patient is speaking with his daughter, Wilford SportsCassie. WIth permission from patient, up dated patient's daughter of his status.

## 2016-05-18 NOTE — ED Notes (Signed)
Daniel, RT assisted with Bi-pap while giving medication. Also, provided a cold cup of ice water.

## 2016-05-18 NOTE — ED Notes (Signed)
When entering the room, pt's bi-pap machine was discontinued. Attempted to place patient back on bi-pap but unsuccessful. Notified Daniel, RT. Placed patient on 3L of oxygen via [place]. Pt's oxygen level is 87%.

## 2016-05-18 NOTE — H&P (Signed)
History and Physical    Roberto Reilly ZOX:[id number] DOB: 07/25/1933 DOA: 05/18/2016  Referring Provider: Dr. Rubin PayorPickering  PCP: Pearson GrippeJames Kim, MD   Patient coming from: Home   Chief Complaint: SOB and increase wheezing   HPI: Roberto ChristmasJames Reilly is a 82 y.o. male with PMH significant for chronic resp failure (usually 2-3L of oxygen at baseline), CKD stage 3, HTN, HLD, CAD S/P CABG, Obesity and PAF (on coumadin chronically); who presented with worsening of SOB and increase wheezing. Patient reports no fever, no CP, no hemoptysis or drastic changes on coughing spells. Endorses increase wheezing and SOB (at rest and worse with exertion). Patient has increased use of nebulizer treatment at home and also increase in his O2 supplementation, without relief. Patient denies abd pain, HA, blurred vision, dysuria, focal weakness or any other complaints.   ED Course: patient received albuterol nebulizer treatment, had CXR (demonstrating mainly bronchitic changes and no acute infiltrates); ABG with hypercapnia. Started on BIPAP. [facility] called to admit patient for further evaluation/treatment of his acute on chronic resp failure.  Review of Systems:  All other systems reviewed and apart from HPI, are negative.  Past Medical History:  Diagnosis Date  . AAA (abdominal aortic aneurysm) (HCC)    4.2 X 4.8 cm 04/2013 CT; declined re-eval 02/25/15  . Anxiety   . CAD in native artery March 2007   Cath for dyspnea on exertion: 75% distal LM, 85% RI, 95% mid-distal Cx, in multiple RCA lesions with 95% distal. --> Referred for CABG; has declined further noninvasive evaluation in the absence of worsening symptoms  . COPD (chronic obstructive pulmonary disease) with emphysema (HCC) 05/23/2007   Hosp 5/29-6/01/12- COPD exacerbation ONOX 12/08/10- desaturated to less than 88% for over an hour, qualifying for home O2 during sleep    . Diverticulosis of colon with hemorrhage 05/24/2013  . Dyslipidemia, goal LDL below 70   . H/O  echocardiogram March ; January 2015   a) Normal LV size and cavity appeared normal function. Grade 1 diastolic dysfunction. Limited study.; b) normal LV size and function with EF 60-65%. Aortic sclerosis without stenosis, mildly increased PA pressures of 44mmHg but normal IVC and RA/RV size  . History of non-ST Elevation MI (myocardial infarction) March 2007   Admitted with COPD exacerbation, given by CHF. Had some chest pain with positive troponins. Cath showed multivessel disease, referred for CABG  . History of pneumonia   . Hypertension   . Hypoxia     chronic, on home O2  . Myocardial infarction   . Obesity (BMI 30-39.9)    One year ago weighed 265 pounds, (12/2012) -- now 234 pounds  . PAF (paroxysmal atrial fibrillation) (HCC)    Anticoagulated on warfarin. Stable -- post op  . Pneumonia    hx  . 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 Seasonal allergies   . Shortness of breath dyspnea     Past Surgical History:  Procedure Laterality Date  . ACHILLES TENDON LENGTHENING Right 10/08/2015   Procedure: ACHILLES TENDON LENGTHENING;  Surgeon: Toni ArthursJohn Hewitt, MD;  Location: [facility] OR;  Service: Orthopedics;  Laterality: Right;  . AMPUTATION Right 10/08/2015   Procedure: RIGHT FOOT TRANSMET AMPUTATION;  Surgeon: Toni ArthursJohn Hewitt, MD;  Location: [facility] OR;  Service: Orthopedics;  Laterality: Right;  . CARDIAC CATHETERIZATION  03 28 2007   NORMAL LV FUNCTION/ ABDOMINAL AORTA STENOSIS,75%-85%. RIGHT FEMORAL ARTERY :CATHETERS USED A  4-FRENCH WITH A 4-FRENCH SHEATH  . CATARACT EXTRACTION  Laser  . COLONOSCOPY N/A 05/24/2013   Procedure: COLONOSCOPY;  Surgeon: Iva Boop, MD;  Location: [facility];  Service: Endoscopy;  Laterality: N/A;  . CORONARY ARTERY BYPASS GRAFT    . RLL resection for Hamartoma     lungs  . RUL for hamartoma     lung     reports that he quit smoking about 18 years ago. His smoking use included Cigarettes. He has a 110.00 pack-year smoking history.  He has never used smokeless tobacco. He reports that he does not drink alcohol or use drugs.  Allergies  Allergen Reactions  . Prednisone Other (See Comments)    Makes pt jittery and nervous    Family History  Problem Relation Age of Onset  . Heart attack Mother   . Heart attack Father      Prior to Admission medications   Medication Sig Start Date End Date Taking? Authorizing Provider  amLODipine (NORVASC) 10 MG tablet Take 1 tablet (10 mg total) by mouth daily. 05/01/13   Dorothea Ogle, MD  aspirin EC 81 MG tablet Take 81 mg by mouth every morning.     Historical Provider, MD  budesonide-formoterol (SYMBICORT) 160-4.5 MCG/ACT inhaler Inhale 2 puffs into the lungs 2 (two) times daily. 04/29/16   Leana Roe Elgergawy, MD  cholecalciferol (VITAMIN D) 1000 UNITS tablet Take 1,000 Units by mouth every morning.     Historical Provider, MD  furosemide (LASIX) 40 MG tablet TAKE ONE TABLET BY MOUTH EVERY MORNING Patient taking differently: TAKE 40  MG BY MOUTH EVERY MORNING 02/08/16   Marykay Lex, MD  ipratropium-albuterol (DUONEB) 0.5-2.5 (3) MG/3ML SOLN Take 3 mLs by nebulization every 6 (six) hours as needed (wheezing and shortness of breath).     Historical Provider, MD  OXYGEN Inhale 2 L into the lungs as needed (shortness of breath).     Historical Provider, MD  pantoprazole (PROTONIX) 40 MG tablet Take 1 tablet (40 mg total) by mouth daily. 04/30/16   Leana Roe Elgergawy, MD  potassium chloride SA (K-DUR,KLOR-CON) 20 MEQ tablet Take 20 mEq by mouth daily. 03/28/16   Historical Provider, MD  predniSONE (DELTASONE) 10 MG tablet Please take 50 mg oral daily for 3 days, then 40 mg oral daily for 3 days, then 30 mg oral daily for 2 days, then 20 mg oral daily for 2 days, then 10 mg oral daily for 3 days, then stop 04/29/16   Starleen Arms, MD  simvastatin (ZOCOR) 40 MG tablet Take 20 mg by mouth every morning.     Historical Provider, MD  warfarin (COUMADIN) 2.5 MG tablet Take 1-2 tablets by  mouth daily as directed by coumadin clinic Patient taking differently: Take 2.5-5 mg by mouth daily. Take 5 mg on MWF& SAT and take 2.5 mg on all other days 03/04/16   Marykay Lex, MD    Physical Exam: Vitals:   05/18/16 1630 05/18/16 1700 05/18/16 1730 05/18/16 1733  BP: 139/59 134/71 149/67 149/67  Pulse: 61 73 84 68  Resp: 25 (!) 28 25 24   Temp:      TempSrc:      SpO2: 93% 94% 97% 93%    Constitutional: tolerating BIPAP, still with tachypnea and increase work of breathing. Patient w/o fever and denying CP. Unable to speak in full sentences. Eyes: PERTLA, lids and conjunctivae normal, no icterus or nystagmus  ENMT: Mucous membranes are moist. Posterior pharynx clear of any exudate or lesions. No Thrush  Neck: normal,  supple, no masses, no thyromegaly Respiratory: fair air movement, positive diffuse rhonchi and exp wheezing; patient tachypneic and with use of accessory muscles. Cardiovascular: rate controlled, no rubs, no gallops; no JVD Abdomen: No distension, no tenderness, no masses palpated. No hepatosplenomegaly. Bowel sounds normal.  Musculoskeletal: no clubbing / cyanosis. Positive trace edema bilaterally  Skin: no rashes or open wounds; positive chronic changes compatible with stasis dermatitis. Patient with partial right foot amputation.  Neurologic: CN 2-12 grossly intact. Sensation intact, DTR normal. Strength 5/5 in all 4 limbs.  Psychiatric: Normal judgment and insight. Alert and oriented x 3. Normal mood.   Labs on Admission: I have personally reviewed following labs and imaging studies  CBC:  Recent Labs Lab 05/18/16 1209  WBC 7.6  HGB 14.7  HCT 47.7  MCV 93.3  PLT 115*   Basic Metabolic Panel:  Recent Labs Lab 05/18/16 1209  NA 141  K 4.4  CL 101  CO2 32  GLUCOSE 129*  BUN 27*  CREATININE 1.28*  CALCIUM 8.1*   GFR: CrCl cannot be calculated (Unknown ideal weight.).   Coagulation Profile:  Recent Labs Lab 05/18/16 1130  INR 2.05    Urine analysis:    Component Value Date/Time   COLORURINE AMBER (A) 11/01/2015 2306   APPEARANCEUR CLEAR 11/01/2015 2306   LABSPEC 1.020 11/01/2015 2306   PHURINE 5.0 11/01/2015 2306   GLUCOSEU NEGATIVE 11/01/2015 2306   HGBUR NEGATIVE 11/01/2015 2306   BILIRUBINUR SMALL (A) 11/01/2015 2306   KETONESUR 15 (A) 11/01/2015 2306   PROTEINUR NEGATIVE 11/01/2015 2306   UROBILINOGEN 0.2 10/19/2010 1150   NITRITE NEGATIVE 11/01/2015 2306   LEUKOCYTESUR NEGATIVE 11/01/2015 2306   Radiological Exams on Admission: Dg Chest Portable 1 View  Result Date: 05/18/2016 CLINICAL DATA:  Shortness of breath and wheezing. COPD exacerbation. EXAM: PORTABLE CHEST 1 VIEW COMPARISON:  04/28/2016 FINDINGS: The heart size and pulmonary vascularity are normal. There is chronic blunting of the right costophrenic angle. Multiple old bilateral rib fractures. CABG. IMPRESSION: No acute abnormalities.  Chronic changes as described. Electronically Signed   By: Francene BoyersJames  Maxwell M.D.   On: 05/18/2016 13:32    EKG: No acute ischemic changes appreciated. Positive findings for left atrial enlargement and right bundle branch block (unchanged from before)  Assessment/Plan 1-Acute and chronic respiratory failure with hypoxia (HCC): due to COPD exacerbation and with hypercapnia on ABG (CO2 66). -will admit to stepdown -due to work of breathing on exam and increase tachypnea/CO2 will use BIPAP trial and weaned as tolerated -started on solumedrol, pulmicort, brovana and doxycycline -will continue scheduled duoneb and PRN albuterol -will also start patient flutter valve -pulmonary service given COPD gold protocol was consulted, will follow further rec's  2-Obesity (BMI 30-39.9) -low calorie diet and increase physical activity discussed with patient -BMI > 30  3-CAD S/P CABG x 4 -no CP -will continue ASA and statins  -will monitor on telemetry given hx of A. Fib and need for nebulizer therapy  4-Paroxysmal atrial  fibrillation (HCC); CHA2DS2-VASc Score = 4. On Warfarin -continue coumadin per pharmacy  5-Essential hypertension -stable overall -will continue home medication regimen  6-COPD with acute exacerbation (HCC) -treatment as mentioned above -given second admission in same month, pulmonary service consulted -no recent PFT's; will need adjustment in maintenance therapy  -continue oxygen supplementation, once able to be weaned off BIPAP  7-Chronic renal failure syndrome, stage 3 (moderate) -stable -will monitor trend  -minimize nephrotoxic agents  8-dyslipidemia -will continue statins    9-hyperglycemia -no prior  hx of diabetes -most likely associated with use of steroids -will monitor and recheck CBG;s in am (Fasting)  Time: 70 minutes   DVT prophylaxis: on Coumadin  Code Status:DNR Family Communication: no family at bedside   Disposition Plan: anticipate discharge in 3-4 days once resp status stable; will assess safety and adequate enviroment for discharge (once stable). Consults called: pulmonary service (following COPD GOLD protocol) Admission status: inpatient, stepdown bed; LOS > 2 midnights    Vassie Loll MD [facility] Pager [phone]  If 7PM-7AM, please contact night-coverage [email or web address] Password [facility]  05/18/2016, 5:51 PM

## 2016-05-18 NOTE — ED Notes (Signed)
Increased patients oxygen to 4L via [place].

## 2016-05-18 NOTE — ED Notes (Signed)
Pt is refusing to use bi-pap. Paged floor coverage hospitalist.

## 2016-05-18 NOTE — ED Notes (Signed)
Daniel, RT has came to bedside to reconnect bi-pap. Patients oxygen saturation has increased.

## 2016-05-19 ENCOUNTER — Inpatient Hospital Stay (HOSPITAL_COMMUNITY): Payer: Medicare Other

## 2016-05-19 DIAGNOSIS — J9690 Respiratory failure, unspecified, unspecified whether with hypoxia or hypercapnia: Secondary | ICD-10-CM

## 2016-05-19 DIAGNOSIS — E669 Obesity, unspecified: Secondary | ICD-10-CM

## 2016-05-19 DIAGNOSIS — J9622 Acute and chronic respiratory failure with hypercapnia: Secondary | ICD-10-CM

## 2016-05-19 DIAGNOSIS — J9621 Acute and chronic respiratory failure with hypoxia: Secondary | ICD-10-CM

## 2016-05-19 DIAGNOSIS — J441 Chronic obstructive pulmonary disease with (acute) exacerbation: Secondary | ICD-10-CM

## 2016-05-19 DIAGNOSIS — Z951 Presence of aortocoronary bypass graft: Secondary | ICD-10-CM

## 2016-05-19 DIAGNOSIS — I480 Paroxysmal atrial fibrillation: Secondary | ICD-10-CM

## 2016-05-19 DIAGNOSIS — I10 Essential (primary) hypertension: Secondary | ICD-10-CM

## 2016-05-19 LAB — PROTIME-INR
INR: 2.03
PROTHROMBIN TIME: 23.2 s — AB (ref 11.4–15.2)

## 2016-05-19 LAB — BASIC METABOLIC PANEL
ANION GAP: 8 (ref 5–15)
BUN: 31 mg/dL — ABNORMAL HIGH (ref 6–20)
CALCIUM: 8.2 mg/dL — AB (ref 8.9–10.3)
CO2: 31 mmol/L (ref 22–32)
CREATININE: 1.05 mg/dL (ref 0.61–1.24)
Chloride: 102 mmol/L (ref 101–111)
Glucose, Bld: 166 mg/dL — ABNORMAL HIGH (ref 65–99)
Potassium: 5.4 mmol/L — ABNORMAL HIGH (ref 3.5–5.1)
SODIUM: 141 mmol/L (ref 135–145)

## 2016-05-19 MED ORDER — WARFARIN SODIUM 2.5 MG PO TABS
2.5000 mg | ORAL_TABLET | Freq: Once | ORAL | Status: AC
  Administered 2016-05-19: 2.5 mg via ORAL
  Filled 2016-05-19 (×2): qty 1

## 2016-05-19 NOTE — ED Notes (Signed)
Spoke with Dr. Ophelia CharterYates about patient refusing bi-pap. She has texted the other provider. Awaiting a call back. She reported she would see patient if the provider didn't call back.

## 2016-05-19 NOTE — ED Notes (Signed)
Dr. Donnamarie PoagK. Kirby at bedside. Pt is resting quietly and oxygen saturation is 95%.

## 2016-05-19 NOTE — ED Notes (Signed)
Provided patient coffee with permission from Dr. Donnamarie PoagK. Kirby.

## 2016-05-19 NOTE — Progress Notes (Addendum)
PROGRESS NOTE  Roberto Reilly AVW:[id number] DOB: 08-31-1933 DOA: 05/18/2016 PCP: Pearson Grippe, MD  Brief History:   82 y.o. male with PMH significant for chronic resp failure (usually 2-3L of oxygen at baseline), CKD stage 2, HTN, HLD, CAD S/P CABG, Obesity and PAF (on coumadin chronically); who presented with 2 day history of SOB and increase wheezing. The patient denies any fevers, chills, chest pain, nausea, vomiting, diarrhea, abdominal pain, dysuria. The patient has been compliant with all his inhalers and nebulizers without improvement. Upon presentation, the patient was noted to be hypoxemic and placed on BiPAP. He was unable to tolerate BiPAP and stated that he never wanted to be on it again. He was transitioned to a Venturi mask and remained clinically stable. Chest x-ray showed chronic interstitial markings with unchanged bibasilar opacities. The patient was started on intravenous steroids and bronchodilators for COPD exacerbation.  Assessment/Plan: Acute on chronic respiratory failure with hypoxia and hypercarbia -Secondary to COPD exacerbation -Presently stable on Venturi mask (0.55) -Wean oxygen to keep saturation 88-92% -Pulmonary hygiene  COPD exacerbation -Continue intravenous Solu-Medrol -Continue duo nebs -Continue doxycycline -Respiratory viral panel -continue brovana and pulmicort  Paroxysmal atrial fibrillation -CHADS-VASc =4 -continue coumadin -rate controlled  CKD2 -baseline creatinine 0.8-1.1 -stable  Hypertension -Controlled -Continue amlodipine  CAD in native artery - LM, RCA, RI &Cx disease -->CABG x 4 (LIMA-LAD, SVG-OM/RI, SVG-rPDA-rPL): -Stable, no evidence of ACS  Hyperkalemia -Discontinue potassium supplementation -am BMP -remain on tele -expect improvement with lasix and bronchodilators  AAA (abdominal aortic aneurysm) without rupture seen on CT scan:  - Stable  Obesity (BMI 30-39.9): - Patient meets criteria with BMI greater  than 30   Disposition Plan:   Home in 2-3 days  Family Communication:  No Family at bedside--Total time spent 35 minutes.  Greater than 50% spent face to face counseling and coordinating care.   Consultants:  none  Code Status:  DNR  DVT Prophylaxis:  warfarin   Procedures: As Listed in Progress Note Above  Antibiotics: None    Subjective: Patient states that he is breathing better this morning. He continues to have a nonproductive cough.  Denies fevers, chills, chest pain, nausea, vomiting, diarrhea, abdominal pain, dysuria, hematuria. Medications and melena.  Objective: Vitals:   05/19/16 0430 05/19/16 0515 05/19/16 0600 05/19/16 0615  BP: 121/69 135/66 127/68   Pulse: 77 78 73 69  Resp: 20 18 14 15   Temp:      TempSrc:      SpO2: 96% 94% 91% 96%   No intake or output data in the 24 hours ending 05/19/16 0723 Weight change:  Exam:   General:  Pt is alert, follows commands appropriately, not in acute distress  HEENT: No icterus, No thrush, No neck mass, [place]/AT  Cardiovascular: IRRR, S1/S2, no rubs, no gallops  Respiratory: Scattered rhonchi with scattered wheezing. Good air movement.  Abdomen: Soft/+BS, non tender, non distended, no guarding  Extremities: No edema, No lymphangitis, No petechiae, No rashes, no synovitis   Data Reviewed: I have personally reviewed following labs and imaging studies Basic Metabolic Panel:  Recent Labs Lab 05/18/16 1209 05/19/16 0540  NA 141 141  K 4.4 5.4*  CL 101 102  CO2 32 31  GLUCOSE 129* 166*  BUN 27* 31*  CREATININE 1.28* 1.05  CALCIUM 8.1* 8.2*  MG 2.5*  --   PHOS 3.5  --    Liver Function Tests:  Recent Labs Lab 05/18/16 1209  AST 25  ALT 19  ALKPHOS 61  BILITOT 0.6  PROT 7.0  ALBUMIN 4.2   No results for input(s): LIPASE, AMYLASE in the last 168 hours. No results for input(s): AMMONIA in the last 168 hours. Coagulation Profile:  Recent Labs Lab 05/18/16 1130 05/19/16 0441  INR 2.05  2.03   CBC:  Recent Labs Lab 05/18/16 1209  WBC 7.6  HGB 14.7  HCT 47.7  MCV 93.3  PLT 115*   Cardiac Enzymes: No results for input(s): CKTOTAL, CKMB, CKMBINDEX, TROPONINI in the last 168 hours. BNP: Invalid input(s): POCBNP CBG: No results for input(s): GLUCAP in the last 168 hours. HbA1C: No results for input(s): HGBA1C in the last 72 hours. Urine analysis:    Component Value Date/Time   COLORURINE AMBER (A) 11/01/2015 2306   APPEARANCEUR CLEAR 11/01/2015 2306   LABSPEC 1.020 11/01/2015 2306   PHURINE 5.0 11/01/2015 2306   GLUCOSEU NEGATIVE 11/01/2015 2306   HGBUR NEGATIVE 11/01/2015 2306   BILIRUBINUR SMALL (A) 11/01/2015 2306   KETONESUR 15 (A) 11/01/2015 2306   PROTEINUR NEGATIVE 11/01/2015 2306   UROBILINOGEN 0.2 10/19/2010 1150   NITRITE NEGATIVE 11/01/2015 2306   LEUKOCYTESUR NEGATIVE 11/01/2015 2306   Sepsis Labs: @LABRCNTIP (procalcitonin:4,lacticidven:4) )No results found for this or any previous visit (from the past 240 hour(s)).   Scheduled Meds: . amLODipine  10 mg Oral Daily  . arformoterol  15 mcg Nebulization BID  . aspirin EC  81 mg Oral Daily  . budesonide (PULMICORT) nebulizer solution  0.25 mg Nebulization BID  . cholecalciferol  1,000 Units Oral Daily  . doxycycline  100 mg Oral Q12H  . furosemide  40 mg Oral q morning - 10a  . ipratropium-albuterol  3 mL Nebulization Q6H  . methylPREDNISolone (SOLU-MEDROL) injection  60 mg Intravenous Q8H  . pantoprazole  40 mg Oral Daily  . potassium chloride SA  20 mEq Oral Daily  . simvastatin  20 mg Oral q1800  . sodium chloride flush  3 mL Intravenous Q12H  . Warfarin - Pharmacist Dosing Inpatient   Does not apply q1800   Continuous Infusions: . sodium chloride      Procedures/Studies: Dg Chest 2 View  Result Date: 04/28/2016 CLINICAL DATA:  Dyspnea and cough. EXAM: CHEST  2 VIEW COMPARISON:  04/26/2016 CXR FINDINGS: Patient status post median sternotomy and CABG. Heart is enlarged but stable.  No aortic arch aneurysm. Chronic blunting and pleural thickening at the right lung base. Diffuse mild interstitial prominence is noted which may reflect bronchitic change. Old left-sided rib fractures are identified involving the left fifth through seventh ribs. EKG leads project over the right medial hemithorax and mediastinum. IMPRESSION: Stable right basilar scarring and pleural thickening. Bilateral increased interstitial prominence of the lungs without pneumonic consolidations. Mild bronchitic change is suspected. Electronically Signed   By: Tollie Ethavid  Kwon M.D.   On: 04/28/2016 15:58   Dg Chest 2 View  Result Date: 04/26/2016 CLINICAL DATA:  Shortness of breath, productive cough for 1 week. EXAM: CHEST  2 VIEW COMPARISON:  11/03/2015 FINDINGS: Prior CABG. Volume loss on the right with probable scarring at the right base. Postoperative changes on the right. No confluent airspace opacities. Heart is borderline in size. No acute bony abnormality. IMPRESSION: Postoperative changes and volume loss on the right with stable chronic changes of the right lung base. No acute findings. Electronically Signed   By: Charlett NoseKevin  Dover M.D.   On: 04/26/2016 13:09   Dg Chest Port 1 View  Result Date: 05/19/2016  CLINICAL DATA:  COPD, dyspnea and respiratory failure EXAM: PORTABLE CHEST 1 VIEW COMPARISON:  05/18/2016 FINDINGS: Mild chronic interstitial prominence with chronic blunting the right costophrenic angle. The heart is borderline enlarged. The patient is status post CABG. Bilateral old rib fractures are noted. No overt pulmonary edema. IMPRESSION: No significant change from prior exam. Chronic blunting of the right costophrenic angle with bilateral chronic rib fractures. No acute cardiopulmonary disease. Electronically Signed   By: Tollie Ethavid  Kwon M.D.   On: 05/19/2016 02:16   Dg Chest Portable 1 View  Result Date: 05/18/2016 CLINICAL DATA:  Shortness of breath and wheezing. COPD exacerbation. EXAM: PORTABLE CHEST 1  VIEW COMPARISON:  04/28/2016 FINDINGS: The heart size and pulmonary vascularity are normal. There is chronic blunting of the right costophrenic angle. Multiple old bilateral rib fractures. CABG. IMPRESSION: No acute abnormalities.  Chronic changes as described. Electronically Signed   By: Francene BoyersJames  Maxwell M.D.   On: 05/18/2016 13:32    Shiloh Swopes, DO  [facility] Pager [phone]  If 7PM-7AM, please contact night-coverage [email or web address] Password [email or web address] 05/19/2016, 7:23 AM   LOS: 1 day

## 2016-05-19 NOTE — Progress Notes (Signed)
ANTICOAGULATION CONSULT NOTE - Follow up  Pharmacy Consult for warfarin Indication: atrial fibrillation  Allergies  Allergen Reactions  . Prednisone Other (See Comments)    Makes pt jittery and nervous    Patient Measurements:    Vital Signs: BP: 129/61 (12/28 0730) Pulse Rate: 76 (12/28 0730)  Labs:  Recent Labs  05/18/16 1130 05/18/16 1209 05/19/16 0441 05/19/16 0540  HGB  --  14.7  --   --   HCT  --  47.7  --   --   PLT  --  115*  --   --   LABPROT 23.4*  --  23.2*  --   INR 2.05  --  2.03  --   CREATININE  --  1.28*  --  1.05    CrCl cannot be calculated (Unknown ideal weight.).   Medical History: Past Medical History:  Diagnosis Date  . AAA (abdominal aortic aneurysm) (HCC)    4.2 X 4.8 cm 04/2013 CT; declined re-eval 02/25/15  . Anxiety   . CAD in native artery March 2007   Cath for dyspnea on exertion: 75% distal LM, 85% RI, 95% mid-distal Cx, in multiple RCA lesions with 95% distal. --> Referred for CABG; has declined further noninvasive evaluation in the absence of worsening symptoms  . COPD (chronic obstructive pulmonary disease) with emphysema (HCC) 05/23/2007   Hosp 5/29-6/01/12- COPD exacerbation ONOX 12/08/10- desaturated to less than 88% for over an hour, qualifying for home O2 during sleep    . Diverticulosis of colon with hemorrhage 05/24/2013  . Dyslipidemia, goal LDL below 70   . H/O echocardiogram March ; January 2015   a) Normal LV size and cavity appeared normal function. Grade 1 diastolic dysfunction. Limited study.; b) normal LV size and function with EF 60-65%. Aortic sclerosis without stenosis, mildly increased PA pressures of 44mmHg but normal IVC and RA/RV size  . History of non-ST Elevation MI (myocardial infarction) March 2007   Admitted with COPD exacerbation, given by CHF. Had some chest pain with positive troponins. Cath showed multivessel disease, referred for CABG  . History of pneumonia   . Hypertension   . Hypoxia     chronic,  on home O2  . Myocardial infarction   . Obesity (BMI 30-39.9)    One year ago weighed 265 pounds, (12/2012) -- now 234 pounds  . PAF (paroxysmal atrial fibrillation) (HCC)    Anticoagulated on warfarin. Stable -- post op  . Pneumonia    hx  . 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 Seasonal allergies   . Shortness of breath dyspnea      Assessment: 82 y.o. male with PMH significant for chronic resp failure (usually 2-3L of oxygen at baseline), CKD stage 3, HTN, HLD, CAD S/P CABG, Obesity and PAF (on coumadin chronically); who presented with worsening of SOB and increase wheezing. Admitted for acute on chronic respiratory failure. Pharmacy consulted to manage warfarin.  Warfarin dose per anticoag clinic notes: 5mg  on M,W,F & Sat, 2.5mg  on T,TH,Sun  Today, 05/19/2016: - INR 2.03 - no reported bleeding  Goal of Therapy:  INR 2-3   Plan:  1) Warfarin 2.5mg  this evening at 6pm per home schedule above 2) Daily INR   Hessie KnowsJustin M Hailley Byers, PharmD, BCPS Pager [phone] 05/19/2016 11:01 AM

## 2016-05-19 NOTE — Progress Notes (Addendum)
NP was called by [facility] Dr. Ophelia CharterYates to call ED about a pt. NP has received NO pages from ED about any pt. NP called and spoke to charge nurse. Pt was identified at that time, but charge RN could not locate the RN for that room. Asked charge RN to tell pt's RN to page me back. Again, received txt from Dr. Ophelia CharterYates asking me to call about this pt. Again, NP did not received a page.  NP called and asked to speak with the charge nurse who put pt's RN, Randa EvensJoanne, on the phone. NP informed RN that NP had not received any pages about this pt. RN stated unit secretary had been paging and perhaps was paging wrongly. While NP on the phone, talked Diplomatic Services operational officersecretary through paging NP via Loretha StaplerAmion, and NP received the page. In re: pt. He has been refusing to wear bipap and when he does, his WOB increases slightly and his O2 sats fall to low 80s. He has chronic respiratory failure and wants to wear what he uses at home. O2 sat still low on [place]. Asked RN to try a venti mask and call back if O2 sats not over 87%. NP believes 88% or above is adequate in this chronic respiratory failure pt. If venti mask fails, then will discuss this with pt at the bedside. Repeat ABG now.  KJKG, NP [facility] NP to bedside. Pt is resting comfortably on VM at 55% O2 with O2 sat 94%. Respirations are even, unlabored. Lungs congested. Card: AF but rate controlled.  Pt awakens easily and is oriented. NP spoke to pt about the bipap. Pt stated he never wanted to be on bipap again. NP spoke about scenerio of needing the bipap to prevent death and he still said he wouldn't want to ever use it again. He is already a DNR.  Because of pt's wishes, will change admission to a tele bed as he no longer needs SDU in light of refusal of bipap. No need for r/p ABG. Allow pt to eat now. Continue care for COPD exacerbation.  Discussed plan with RN.  CXR this am.  Hortencia ConradiKJKG, NP [facility]

## 2016-05-19 NOTE — ED Notes (Signed)
Patient is resting quietly with eyes closed but experiencing increase in respirations, oxygen saturation at 77%, and taking deeper breaths. Paging admitting physician.

## 2016-05-19 NOTE — Evaluation (Signed)
Physical Therapy Evaluation Patient Details Name: Roberto Reilly MRN: [id number] DOB: 08/06/1933 Today's Date: 05/19/2016   History of Present Illness  82 year old male with history of A. fib on warfarin, chronic respiratory failure on 2 L nasal cannula at baseline, CABG x4, R foot transmet amputation and admitted for Acute on chronic respiratory failure with hypoxia and hypercarbia.  Clinical Impression  The patient ambulated on 2 liters with sats dropping to 84%. Was on 5 liters in room. sats returned to 94% on 5 liters and rest. Pt admitted with above diagnosis. The patient states that he will return home at DC. Pt currently with functional limitations due to the deficits listed below (see PT Problem List).  Pt will benefit from skilled PT to increase their independence and safety with mobility to allow discharge to the venue listed below.       Follow Up Recommendations Home health PT    Equipment Recommendations  None recommended by PT    Recommendations for Other Services       Precautions / Restrictions Precautions Precaution Comments: chronic oxygen, droplet, prefers to wear shoes      Mobility  Bed Mobility               General bed mobility comments: pt up in the chair  Transfers Overall transfer level: Needs assistance Equipment used: Rolling walker (2 wheeled) Transfers: Sit to/from Stand Sit to Stand: Supervision         General transfer comment: cues for slowing down  Ambulation/Gait Ambulation/Gait assistance: Min guard;Min assist Ambulation Distance (Feet): 120 Feet Assistive device: Rolling walker (2 wheeled) Gait Pattern/deviations: Step-through pattern     General Gait Details: cues to slow down. Patient placed on 2 liters with sats dropping to 84%. Placed on 5 liters in room which patient was on prior to ambualtion. dyspnea 4/4 at times.    Stairs            Wheelchair Mobility    Modified Rankin (Stroke Patients Only)        Balance Overall balance assessment: Needs assistance         Standing balance support: During functional activity;Bilateral upper extremity supported Standing balance-Leahy Scale: Fair Standing balance comment: noted one balance loss                             Pertinent Vitals/Pain Pain Assessment: No/denies pain    Home Living Family/patient expects to be discharged to:: Private residence Living Arrangements: Alone   Type of Home: Apartment Home Access: Level entry     Home Layout: One level Home Equipment: Cane - single point;Walker - 2 wheels Additional Comments: pt uses 2L O2 at baseline,     Prior Function Level of Independence: Independent with assistive device(s)         Comments: cane at baseline, drives     Hand Dominance        Extremity/Trunk Assessment   Upper Extremity Assessment Upper Extremity Assessment: Overall WFL for tasks assessed    Lower Extremity Assessment Lower Extremity Assessment: Overall WFL for tasks assessed    Cervical / Trunk Assessment Cervical / Trunk Assessment: Kyphotic  Communication   Communication: HOH  Cognition Arousal/Alertness: Awake/alert Behavior During Therapy: Impulsive;WFL for tasks assessed/performed Overall Cognitive Status: Within Functional Limits for tasks assessed                      General Comments  Exercises     Assessment/Plan    PT Assessment Patient needs continued PT services  PT Problem List Decreased activity tolerance;Cardiopulmonary status limiting activity;Decreased mobility          PT Treatment Interventions DME instruction;Gait training;Functional mobility training;Therapeutic activities    PT Goals (Current goals can be found in the Care Plan section)  Acute Rehab PT Goals Patient Stated Goal: to go home PT Goal Formulation: With patient Time For Goal Achievement: 05/26/16 Potential to Achieve Goals: Good    Frequency Min 3X/week    Barriers to discharge        Co-evaluation               End of Session Equipment Utilized During Treatment: Oxygen Activity Tolerance: Treatment limited secondary to medical complications (Comment) (DOE) Patient left: in chair;with call bell/phone within reach;with chair alarm set Nurse Communication: Mobility status         Time: [id number] PT Time Calculation (min) (ACUTE ONLY): 16 min   Charges:   PT Evaluation $PT Eval Moderate Complexity: 1 Procedure     PT G CodesRada Hay:        Kristain Hu Elizabeth 05/19/2016, 4:14 PM  Blanchard KelchKaren Nichollas Perusse PT [phone]

## 2016-05-19 NOTE — ED Notes (Signed)
Spoke with Dr. Craige CottaKirby about patient refusing bi-pap and provided information about his condition. She requested to place patient on a 55% venti mask and to call her back to follow up with his condition. Placed patient on mask. Pt seemed to tolerate this much better.

## 2016-05-19 NOTE — ED Notes (Signed)
Breakfast Plate Giving

## 2016-05-20 DIAGNOSIS — J205 Acute bronchitis due to respiratory syncytial virus: Secondary | ICD-10-CM

## 2016-05-20 LAB — RESPIRATORY PANEL BY PCR
ADENOVIRUS-RVPPCR: NOT DETECTED
Bordetella pertussis: NOT DETECTED
CORONAVIRUS NL63-RVPPCR: NOT DETECTED
CORONAVIRUS OC43-RVPPCR: NOT DETECTED
Chlamydophila pneumoniae: NOT DETECTED
Coronavirus 229E: NOT DETECTED
Coronavirus HKU1: NOT DETECTED
INFLUENZA A-RVPPCR: NOT DETECTED
Influenza B: NOT DETECTED
METAPNEUMOVIRUS-RVPPCR: NOT DETECTED
Mycoplasma pneumoniae: NOT DETECTED
PARAINFLUENZA VIRUS 1-RVPPCR: NOT DETECTED
PARAINFLUENZA VIRUS 2-RVPPCR: NOT DETECTED
PARAINFLUENZA VIRUS 4-RVPPCR: NOT DETECTED
Parainfluenza Virus 3: NOT DETECTED
RHINOVIRUS / ENTEROVIRUS - RVPPCR: NOT DETECTED
Respiratory Syncytial Virus: DETECTED — AB

## 2016-05-20 LAB — BASIC METABOLIC PANEL
ANION GAP: 8 (ref 5–15)
BUN: 39 mg/dL — ABNORMAL HIGH (ref 6–20)
CHLORIDE: 98 mmol/L — AB (ref 101–111)
CO2: 31 mmol/L (ref 22–32)
CREATININE: 1.15 mg/dL (ref 0.61–1.24)
Calcium: 8.3 mg/dL — ABNORMAL LOW (ref 8.9–10.3)
GFR calc non Af Amer: 58 mL/min — ABNORMAL LOW (ref >60)
Glucose, Bld: 160 mg/dL — ABNORMAL HIGH (ref 65–99)
POTASSIUM: 4.7 mmol/L (ref 3.5–5.1)
Sodium: 137 mmol/L (ref 135–145)

## 2016-05-20 LAB — PROTIME-INR
INR: 2.48
Prothrombin Time: 27.3 seconds — ABNORMAL HIGH (ref 11.4–15.2)

## 2016-05-20 MED ORDER — BUDESONIDE 0.5 MG/2ML IN SUSP
0.5000 mg | Freq: Two times a day (BID) | RESPIRATORY_TRACT | Status: DC
  Administered 2016-05-20 – 2016-05-22 (×4): 0.5 mg via RESPIRATORY_TRACT
  Filled 2016-05-20 (×4): qty 2

## 2016-05-20 MED ORDER — IPRATROPIUM-ALBUTEROL 0.5-2.5 (3) MG/3ML IN SOLN
3.0000 mL | Freq: Four times a day (QID) | RESPIRATORY_TRACT | Status: DC
  Administered 2016-05-21 – 2016-05-22 (×5): 3 mL via RESPIRATORY_TRACT
  Filled 2016-05-20 (×5): qty 3

## 2016-05-20 MED ORDER — WARFARIN SODIUM 5 MG PO TABS
5.0000 mg | ORAL_TABLET | Freq: Once | ORAL | Status: AC
  Administered 2016-05-20: 5 mg via ORAL
  Filled 2016-05-20: qty 1

## 2016-05-20 MED ORDER — ALBUTEROL SULFATE (2.5 MG/3ML) 0.083% IN NEBU: 2.5000 mg | INHALATION_SOLUTION | RESPIRATORY_TRACT | Status: DC | PRN

## 2016-05-20 NOTE — Progress Notes (Signed)
PT Cancellation Note  Patient Details Name: Karna ChristmasJames Zamudio MRN: [id number] DOB: 10/15/1933   Cancelled Treatment:    Reason Eval/Treat Not Completed: Patient declined,  (patient reports that he is not interested in walking, states that he walks in the room.  check back another day.)   Rada HayHill, Auden Tatar Elizabeth 05/20/2016, 3:48 PM

## 2016-05-20 NOTE — Care Management Note (Signed)
Case Management Note  Patient Details  Name: Roberto Reilly MRN: [id number] Date of Birth: 11/01/1933  Subjective/Objective:  82 y/o m admitted w/Acute & chronic resp failure. Readmit-COPD. Referral for COPD gold-patient states he has declined HHC in the past, & declines again.From home alone, has a daughter who takes him to pcp,appts,errands. States his daughter will transport home @ d/c.                  Action/Plan:d/c home.   Expected Discharge Date:   (unknown)               Expected Discharge Plan:  Home w Home Health Services  In-House Referral:     Discharge planning Services  CM Consult  Post Acute Care Choice:  Durable Medical Equipment ([facility]-home 02 HS) Choice offered to:  Patient  DME Arranged:    DME Agency:     HH Arranged:    HH Agency:     Status of Service:  In process, will continue to follow  If discussed at Long Length of Stay Meetings, dates discussed:    Additional Comments:  [name], [name], RN 05/20/2016, 4:04 PM

## 2016-05-20 NOTE — Care Management Note (Signed)
Case Management Note  Patient Details  Name: Roberto Reilly MRN: [id number] Date of Birth: 02/12/1934  Subjective/Objective: 82 y/o m admitted w/Acute on chronic resp failure. From home. Readmit-COPD. PT-recc HHPT. [facility].                   Action/Plan:d/c plan home w/HHC.   Expected Discharge Date:   (unknown)               Expected Discharge Plan:  Home w Home Health Services  In-House Referral:     Discharge planning Services  CM Consult  Post Acute Care Choice:    Choice offered to:  Patient  DME Arranged:    DME Agency:     HH Arranged:    HH Agency:     Status of Service:  In process, will continue to follow  If discussed at Long Length of Stay Meetings, dates discussed:    Additional Comments:  [name], Roberto Morina, RN 05/20/2016, 3:01 PM

## 2016-05-20 NOTE — Progress Notes (Signed)
Nutrition Brief Note  Patient identified to be seen per COPD Gold Protocol.  Wt Readings from Last 15 Encounters:  05/20/16 226 lb 3.2 oz (102.6 kg)  04/26/16 218 lb (98.9 kg)  03/22/16 223 lb (101.2 kg)  02/05/16 212 lb (96.2 kg)  11/26/15 203 lb 12.8 oz (92.4 kg)  11/05/15 209 lb 14.1 oz (95.2 kg)  10/08/15 212 lb 7 oz (96.4 kg)  10/06/15 212 lb 7 oz (96.4 kg)  03/12/15 230 lb 3.2 oz (104.4 kg)  02/23/15 226 lb 8 oz (102.7 kg)  09/10/14 231 lb (104.8 kg)  03/11/14 238 lb 9.6 oz (108.2 kg)  01/06/14 235 lb 8 oz (106.8 kg)  09/10/13 230 lb 6.4 oz (104.5 kg)  05/25/13 211 lb 3.2 oz (95.8 kg)   Spoke with patient at bedside. He reports good appetite and intake here and PTA. Patient reports his weight has been stable and in fact he may have gained 10 lbs over the holiday season with eating more at meals with family. Well-nourished on NFPE.  Body mass index is 30.68 kg/m. Patient meets criteria for Obesity Class I based on current BMI.   Current diet order is Heart Healthy, patient is consuming approximately 100% of meals at this time. Labs and medications reviewed.   No nutrition interventions warranted at this time. If nutrition issues arise, please consult RD.   Helane RimaLeanne Tylyn Derwin, MS, RD, LDN Pager: [phone] After Hours Pager: [phone]

## 2016-05-20 NOTE — Progress Notes (Addendum)
PROGRESS NOTE  Roberto Reilly:[id number] DOB: [birth year] DOA: 05/18/2016 PCP: Pearson Grippe, MD  Brief History:  82 y.o.malewith PMH significant for chronic resp failure (usually 2-3L of oxygen at baseline), CKD stage 2, HTN, HLD, CAD S/P CABG, Obesity and PAF (on coumadin chronically); who presented with 2 day history of SOB and increase wheezing. The patient was recently discharged from the hospital after a stay from 04/26/2016 through 04/30/2016 secondary to COPD exacerbation. However he has been around his son and daughter who have had upper respiratory type infections. The patient denies any fevers, chills, chest pain, nausea, vomiting, diarrhea, abdominal pain, dysuria. The patient has been compliant with all his inhalers and nebulizers without improvement. Upon presentation, the patient was noted to be hypoxemic and placed on BiPAP. He was unable to tolerate BiPAP and stated that he never wanted to be on it again. He was transitioned to a Venturi mask and remained clinically stable. Chest x-ray showed chronic interstitial markings with unchanged bibasilar opacities. The patient was started on intravenous steroids and bronchodilators for COPD exacerbation.  Assessment/Plan: Acute on chronic respiratory failure with hypoxia and hypercarbia -Secondary to COPD exacerbation -Presently stable on Venturi mask (0.55)-->weaned to 4L [place] -Wean oxygen to keep saturation 88-92% -Pulmonary hygiene  COPD exacerbation -due to RSV infection -respiratory viral panel--positive RSV-->likely slow improvement -Continue intravenous Solu-Medrol -Continue duo nebs -d/c doxycycline -continue brovana and pulmicort--increase pulmicort to 0.5 mg bid -plan longer prednisone taper when ready for d/c  Paroxysmal atrial fibrillation -CHADS-VASc =4 -continue coumadin -rate controlled -d/c tele  CKD2 -baseline creatinine 0.8-1.1 -stable  Hypertension -Continue amlodipine -BP  acceptable  CAD in native artery - LM, RCA, RI &Cx disease -->CABG x 4 (LIMA-LAD, SVG-OM/RI, SVG-rPDA-rPL):  --Stable, no evidence of ACS  Hyperkalemia -Discontinue potassium supplementation -improved -d/c tele  AAA (abdominal aortic aneurysm) without rupture seen on CT scan:  - Stable  Obesity (BMI 30-39.9): - Patient meets criteria with BMI greater than 30   Disposition Plan:   Home in 2-3 days  Family Communication:  No Family at bedside  Consultants:  none  Code Status:  DNR  DVT Prophylaxis:  warfarin   Procedures: As Listed in Progress Note Above  Antibiotics: Doxy 12/27>>12/29   Subjective: Patient is breathing a little better but still having dyspnea with mild exertion. Denies any fevers, chills, headache, neck pain, chest pain, nausea, vomiting, diarrhea, abdominal pain, dysuria, hematuria, hematochezia, melena.  Objective: Vitals:   05/20/16 0614 05/20/16 0811 05/20/16 0817 05/20/16 1352  BP: 135/68   (!) 154/74  Pulse: 71   (!) 102  Resp: (!) 22   18  Temp: 97.7 F (36.5 C)   98 F (36.7 C)  TempSrc: Oral   Oral  SpO2: 94% 93% 93% 96%  Weight: 102.6 kg (226 lb 3.2 oz)     Height:        Intake/Output Summary (Last 24 hours) at 05/20/16 1751 Last data filed at 05/20/16 1701  Gross per 24 hour  Intake              480 ml  Output             2150 ml  Net            -1670 ml   Weight change:  Exam:   General:  Pt is alert, follows commands appropriately, not in acute distress  HEENT: No icterus, No thrush, No neck mass, [place]/AT  Cardiovascular:  RRR, S1/S2, no rubs, no gallops  Respiratory: Bilateral scattered rales with expiratory wheezing at the bases. Good air movement.  Abdomen: Soft/+BS, non tender, non distended, no guarding  Extremities: trace LE edema, No lymphangitis, No petechiae, No rashes, no synovitis   Data Reviewed: I have personally reviewed following labs and imaging studies Basic Metabolic  Panel:  Recent Labs Lab 05/18/16 1209 05/19/16 0540 05/20/16 1514  NA 141 141 137  K 4.4 5.4* 4.7  CL 101 102 98*  CO2 32 31 31  GLUCOSE 129* 166* 160*  BUN 27* 31* 39*  CREATININE 1.28* 1.05 1.15  CALCIUM 8.1* 8.2* 8.3*  MG 2.5*  --   --   PHOS 3.5  --   --    Liver Function Tests:  Recent Labs Lab 05/18/16 1209  AST 25  ALT 19  ALKPHOS 61  BILITOT 0.6  PROT 7.0  ALBUMIN 4.2   No results for input(s): LIPASE, AMYLASE in the last 168 hours. No results for input(s): AMMONIA in the last 168 hours. Coagulation Profile:  Recent Labs Lab 05/18/16 1130 05/19/16 0441 05/20/16 0539  INR 2.05 2.03 2.48   CBC:  Recent Labs Lab 05/18/16 1209  WBC 7.6  HGB 14.7  HCT 47.7  MCV 93.3  PLT 115*   Cardiac Enzymes: No results for input(s): CKTOTAL, CKMB, CKMBINDEX, TROPONINI in the last 168 hours. BNP: Invalid input(s): POCBNP CBG: No results for input(s): GLUCAP in the last 168 hours. HbA1C: No results for input(s): HGBA1C in the last 72 hours. Urine analysis:    Component Value Date/Time   COLORURINE AMBER (A) 11/01/2015 2306   APPEARANCEUR CLEAR 11/01/2015 2306   LABSPEC 1.020 11/01/2015 2306   PHURINE 5.0 11/01/2015 2306   GLUCOSEU NEGATIVE 11/01/2015 2306   HGBUR NEGATIVE 11/01/2015 2306   BILIRUBINUR SMALL (A) 11/01/2015 2306   KETONESUR 15 (A) 11/01/2015 2306   PROTEINUR NEGATIVE 11/01/2015 2306   UROBILINOGEN 0.2 10/19/2010 1150   NITRITE NEGATIVE 11/01/2015 2306   LEUKOCYTESUR NEGATIVE 11/01/2015 2306   Sepsis Labs: @LABRCNTIP (procalcitonin:4,lacticidven:4) ) Recent Results (from the past 240 hour(s))  Respiratory Panel by PCR     Status: Abnormal   Collection Time: 05/19/16  2:07 PM  Result Value Ref Range Status   Adenovirus NOT DETECTED NOT DETECTED Final   Coronavirus 229E NOT DETECTED NOT DETECTED Final   Coronavirus HKU1 NOT DETECTED NOT DETECTED Final   Coronavirus NL63 NOT DETECTED NOT DETECTED Final   Coronavirus OC43 NOT DETECTED  NOT DETECTED Final   Metapneumovirus NOT DETECTED NOT DETECTED Final   Rhinovirus / Enterovirus NOT DETECTED NOT DETECTED Final   Influenza A NOT DETECTED NOT DETECTED Final   Influenza B NOT DETECTED NOT DETECTED Final   Parainfluenza Virus 1 NOT DETECTED NOT DETECTED Final   Parainfluenza Virus 2 NOT DETECTED NOT DETECTED Final   Parainfluenza Virus 3 NOT DETECTED NOT DETECTED Final   Parainfluenza Virus 4 NOT DETECTED NOT DETECTED Final   Respiratory Syncytial Virus DETECTED (A) NOT DETECTED Final    Comment: CRITICAL RESULT CALLED TO, READ BACK BY AND VERIFIED WITH: Julio SicksK. Phillips RN 13:00 05/20/16 ([email or web address])    Bordetella pertussis NOT DETECTED NOT DETECTED Final   Chlamydophila pneumoniae NOT DETECTED NOT DETECTED Final   Mycoplasma pneumoniae NOT DETECTED NOT DETECTED Final    Comment: [facility]     Scheduled Meds: . amLODipine  10 mg Oral Daily  . arformoterol  15 mcg Nebulization BID  . aspirin EC  81 mg  Oral Daily  . budesonide (PULMICORT) nebulizer solution  0.25 mg Nebulization BID  . cholecalciferol  1,000 Units Oral Daily  . doxycycline  100 mg Oral Q12H  . furosemide  40 mg Oral q morning - 10a  . ipratropium-albuterol  3 mL Nebulization Q6H  . methylPREDNISolone (SOLU-MEDROL) injection  60 mg Intravenous Q8H  . pantoprazole  40 mg Oral Daily  . simvastatin  20 mg Oral q1800  . sodium chloride flush  3 mL Intravenous Q12H  . Warfarin - Pharmacist Dosing Inpatient   Does not apply q1800   Continuous Infusions:  Procedures/Studies: Dg Chest 2 View  Result Date: 04/28/2016 CLINICAL DATA:  Dyspnea and cough. EXAM: CHEST  2 VIEW COMPARISON:  04/26/2016 CXR FINDINGS: Patient status post median sternotomy and CABG. Heart is enlarged but stable. No aortic arch aneurysm. Chronic blunting and pleural thickening at the right lung base. Diffuse mild interstitial prominence is noted which may reflect bronchitic change. Old left-sided rib fractures are  identified involving the left fifth through seventh ribs. EKG leads project over the right medial hemithorax and mediastinum. IMPRESSION: Stable right basilar scarring and pleural thickening. Bilateral increased interstitial prominence of the lungs without pneumonic consolidations. Mild bronchitic change is suspected. Electronically Signed   By: Tollie Ethavid  Kwon M.D.   On: 04/28/2016 15:58   Dg Chest 2 View  Result Date: 04/26/2016 CLINICAL DATA:  Shortness of breath, productive cough for 1 week. EXAM: CHEST  2 VIEW COMPARISON:  11/03/2015 FINDINGS: Prior CABG. Volume loss on the right with probable scarring at the right base. Postoperative changes on the right. No confluent airspace opacities. Heart is borderline in size. No acute bony abnormality. IMPRESSION: Postoperative changes and volume loss on the right with stable chronic changes of the right lung base. No acute findings. Electronically Signed   By: Charlett NoseKevin  Dover M.D.   On: 04/26/2016 13:09   Dg Chest Port 1 View  Result Date: 05/19/2016 CLINICAL DATA:  COPD, dyspnea and respiratory failure EXAM: PORTABLE CHEST 1 VIEW COMPARISON:  05/18/2016 FINDINGS: Mild chronic interstitial prominence with chronic blunting the right costophrenic angle. The heart is borderline enlarged. The patient is status post CABG. Bilateral old rib fractures are noted. No overt pulmonary edema. IMPRESSION: No significant change from prior exam. Chronic blunting of the right costophrenic angle with bilateral chronic rib fractures. No acute cardiopulmonary disease. Electronically Signed   By: Tollie Ethavid  Kwon M.D.   On: 05/19/2016 02:16   Dg Chest Portable 1 View  Result Date: 05/18/2016 CLINICAL DATA:  Shortness of breath and wheezing. COPD exacerbation. EXAM: PORTABLE CHEST 1 VIEW COMPARISON:  04/28/2016 FINDINGS: The heart size and pulmonary vascularity are normal. There is chronic blunting of the right costophrenic angle. Multiple old bilateral rib fractures. CABG. IMPRESSION:  No acute abnormalities.  Chronic changes as described. Electronically Signed   By: Francene BoyersJames  Maxwell M.D.   On: 05/18/2016 13:32    Shakeem Stern, DO  [facility] Pager [phone]  If 7PM-7AM, please contact night-coverage [email or web address] Password [email or web address] 05/20/2016, 5:51 PM   LOS: 2 days

## 2016-05-20 NOTE — Progress Notes (Addendum)
ANTICOAGULATION CONSULT NOTE - Follow up  Pharmacy Consult for warfarin Indication: atrial fibrillation  Allergies  Allergen Reactions  . Prednisone Other (See Comments)    Makes pt jittery and nervous    Patient Measurements: Height: 6' (182.9 cm) Weight: 226 lb 3.2 oz (102.6 kg) IBW/kg (Calculated) : 77.6  Vital Signs: Temp: 97.7 F (36.5 C) (12/29 0614) Temp Source: Oral (12/29 0614) BP: 135/68 (12/29 0614) Pulse Rate: 71 (12/29 0614)  Labs:  Recent Labs  05/18/16 1130 05/18/16 1209 05/19/16 0441 05/19/16 0540 05/20/16 0539  HGB  --  14.7  --   --   --   HCT  --  47.7  --   --   --   PLT  --  115*  --   --   --   LABPROT 23.4*  --  23.2*  --  27.3*  INR 2.05  --  2.03  --  2.48  CREATININE  --  1.28*  --  1.05  --     Estimated Creatinine Clearance: 69.5 mL/min (by C-G formula based on SCr of 1.05 mg/dL).   Medical History: Past Medical History:  Diagnosis Date  . AAA (abdominal aortic aneurysm) (HCC)    4.2 X 4.8 cm 04/2013 CT; declined re-eval 02/25/15  . Anxiety   . CAD in native artery March 2007   Cath for dyspnea on exertion: 75% distal LM, 85% RI, 95% mid-distal Cx, in multiple RCA lesions with 95% distal. --> Referred for CABG; has declined further noninvasive evaluation in the absence of worsening symptoms  . COPD (chronic obstructive pulmonary disease) with emphysema (HCC) 05/23/2007   Hosp 5/29-6/01/12- COPD exacerbation ONOX 12/08/10- desaturated to less than 88% for over an hour, qualifying for home O2 during sleep    . Diverticulosis of colon with hemorrhage 05/24/2013  . Dyslipidemia, goal LDL below 70   . H/O echocardiogram March ; January 2015   a) Normal LV size and cavity appeared normal function. Grade 1 diastolic dysfunction. Limited study.; b) normal LV size and function with EF 60-65%. Aortic sclerosis without stenosis, mildly increased PA pressures of 44mmHg but normal IVC and RA/RV size  . History of non-ST Elevation MI (myocardial  infarction) March 2007   Admitted with COPD exacerbation, given by CHF. Had some chest pain with positive troponins. Cath showed multivessel disease, referred for CABG  . History of pneumonia   . Hypertension   . Hypoxia     chronic, on home O2  . Myocardial infarction   . Obesity (BMI 30-39.9)    One year ago weighed 265 pounds, (12/2012) -- now 234 pounds  . PAF (paroxysmal atrial fibrillation) (HCC)    Anticoagulated on warfarin. Stable -- post op  . Pneumonia    hx  . 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 Seasonal allergies   . Shortness of breath dyspnea      Assessment: 82 y.o. male with PMH significant for chronic resp failure (usually 2-3L of oxygen at baseline), CKD stage 3, HTN, HLD, CAD S/P CABG, obesity and PAF (on coumadin chronically); who presented with worsening of SOB and increase wheezing. Admitted for acute on chronic respiratory failure. Pharmacy consulted to manage warfarin.  Warfarin dose per anticoag clinic notes: 5mg  on M,W,F & Sat, 2.5mg  on T,TH,Sun  Today, 05/20/2016: - INR 2.48, remains therapeutic - no reported bleeding - drug interactions: doxycycline, steroids  Goal of Therapy:  INR 2-3   Plan:  1) Warfarin 5mg  this evening at 6pm per home schedule above 2) Daily PT/INR 3) Monitor CBC and for s/sx of bleeding   Greer PickerelJigna Zykeriah Mathia, PharmD, BCPS Pager: [phone] 05/20/2016 1:27 PM

## 2016-05-20 NOTE — Progress Notes (Signed)
CRITICAL VALUE ALERT  Critical value received:  Positive RSV from respiratory panel  Date of notification:  05/20/2016   Time of notification:  1350  Critical value read back:Yes.    Nurse who received alert:  Jodi MarbleKristyn Phillips  MD notified (1st page):  Dr. Arbutus Leasat  Time of first page:  1352  MD notified (2nd page):  Time of second page:  Responding MD:    Time MD responded:    Spoke with infection prevention RN who stated patient could have droplet precautions d/c. Patient informed.

## 2016-05-20 NOTE — Evaluation (Signed)
Occupational Therapy Evaluation Patient Details Name: Karna ChristmasJames Detweiler MRN: [id number] DOB: 08/28/1933 Today's Date: 05/20/2016    History of Present Illness 82 year old male with history of A. fib on warfarin, chronic respiratory failure on 2 L nasal cannula at baseline, CABG x4, R foot transmet amputation and admitted for Acute on chronic respiratory failure with hypoxia and hypercarbia.   Clinical Impression   Pt was admitted for the above.  At baseline, he is mod I for adls.  Pt currently needs set up, but he did not perform full ADL at time of evaluation.  He is on 5 liters of 02 instead of his baseline of 2 as needed. Will follow in acute setting with mod I level goals.    Follow Up Recommendations  Supervision - Intermittent    Equipment Recommendations   (pt declines DME for bathroom)    Recommendations for Other Services       Precautions / Restrictions Precautions Precaution Comments: pt states he wears 2 liters 02 prn Restrictions Weight Bearing Restrictions: No      Mobility Bed Mobility               General bed mobility comments: oob  Transfers   Equipment used: Rolling walker (2 wheeled)   Sit to Stand: Supervision              Balance                                            ADL Overall ADL's : Needs assistance/impaired                                       General ADL Comments: pt performed limited ADL activities. He did not want to ambulate to bathroom and do complete ADL this afternoon.  Pt believe he is near baseline; however, usually he wears 2 liters of 02 prn and he now is using 5.  Pt needs set up for adl at this time with extra time and rest breaks.  He demonstrated pursed lip breathing and states that he gives himself rest breaks as needed     Vision     Perception     Praxis      Pertinent Vitals/Pain Pain Assessment: No/denies pain     Hand Dominance     Extremity/Trunk  Assessment Upper Extremity Assessment Upper Extremity Assessment: Overall WFL for tasks assessed           Communication Communication Communication: HOH   Cognition Arousal/Alertness: Awake/alert Behavior During Therapy: WFL for tasks assessed/performed Overall Cognitive Status: Within Functional Limits for tasks assessed                     General Comments       Exercises       Shoulder Instructions      Home Living Family/patient expects to be discharged to:: Private residence Living Arrangements: Alone Available Help at Discharge: Family               Bathroom Shower/Tub: Tub/shower unit Shower/tub characteristics: Curtain FirefighterBathroom Toilet: Standard     Home Equipment: Grab bars - tub/shower   Additional Comments: pt's daughter comes on Saturdays, cleans and gets his pills organized.  He had DME from wife, but gave  it away because he didn't need it      Prior Functioning/Environment Level of Independence: Independent with assistive device(s)        Comments: cane at baseline, drives        OT Problem List: Decreased activity tolerance;Cardiopulmonary status limiting activity   OT Treatment/Interventions: Self-care/ADL training;DME and/or AE instruction;Patient/family education    OT Goals(Current goals can be found in the care plan section) ADL Goals Pt Will Transfer to Toilet: with modified independence;regular height toilet;ambulating Additional ADL Goal #1: pt will gather clothes and perform adl at mod I level  OT Frequency: Min 2X/week   Barriers to D/C:            Co-evaluation              End of Session    Activity Tolerance: Patient tolerated treatment well (for limited evaluation) Patient left: in chair;with call bell/phone within reach;with chair alarm set   Time: [id number] OT Time Calculation (min): 11 min Charges:  OT General Charges $OT Visit: 1 Procedure OT Evaluation $OT Eval Low Complexity: 1  Procedure G-Codes:    Sahory Nordling 05/20/2016, 1:33 PM  Marica OtterMaryellen Tenna Lacko, OTR/L [phone] 05/20/2016

## 2016-05-21 LAB — BASIC METABOLIC PANEL
ANION GAP: 9 (ref 5–15)
BUN: 34 mg/dL — AB (ref 6–20)
CO2: 31 mmol/L (ref 22–32)
Calcium: 8.2 mg/dL — ABNORMAL LOW (ref 8.9–10.3)
Chloride: 98 mmol/L — ABNORMAL LOW (ref 101–111)
Creatinine, Ser: 0.86 mg/dL (ref 0.61–1.24)
Glucose, Bld: 126 mg/dL — ABNORMAL HIGH (ref 65–99)
POTASSIUM: 4.7 mmol/L (ref 3.5–5.1)
SODIUM: 138 mmol/L (ref 135–145)

## 2016-05-21 LAB — CBC
HCT: 39.7 % (ref 39.0–52.0)
Hemoglobin: 12.9 g/dL — ABNORMAL LOW (ref 13.0–17.0)
MCH: 28.7 pg (ref 26.0–34.0)
MCHC: 32.5 g/dL (ref 30.0–36.0)
MCV: 88.4 fL (ref 78.0–100.0)
Platelets: 122 10*3/uL — ABNORMAL LOW (ref 150–400)
RBC: 4.49 MIL/uL (ref 4.22–5.81)
RDW: 15.6 % — ABNORMAL HIGH (ref 11.5–15.5)
WBC: 10.6 10*3/uL — ABNORMAL HIGH (ref 4.0–10.5)

## 2016-05-21 LAB — PROTIME-INR
INR: 2.66
Prothrombin Time: 28.8 seconds — ABNORMAL HIGH (ref 11.4–15.2)

## 2016-05-21 MED ORDER — WARFARIN SODIUM 5 MG PO TABS
5.0000 mg | ORAL_TABLET | Freq: Once | ORAL | Status: AC
  Administered 2016-05-21: 5 mg via ORAL
  Filled 2016-05-21: qty 1

## 2016-05-21 NOTE — Progress Notes (Signed)
ANTICOAGULATION CONSULT NOTE - Follow up  Pharmacy Consult for warfarin Indication: atrial fibrillation  Allergies  Allergen Reactions  . Prednisone Other (See Comments)    Makes pt jittery and nervous    Patient Measurements: Height: 6' (182.9 cm) Weight: 223 lb 12.3 oz (101.5 kg) IBW/kg (Calculated) : 77.6  Vital Signs: Temp: 97.9 F (36.6 C) (12/30 0538) Temp Source: Oral (12/30 0538) BP: 140/61 (12/30 0538) Pulse Rate: 66 (12/30 0538)  Labs:  Recent Labs  05/18/16 1209 05/19/16 0441 05/19/16 0540 05/20/16 0539 05/20/16 1514 05/21/16 0517  HGB 14.7  --   --   --   --  12.9*  HCT 47.7  --   --   --   --  39.7  PLT 115*  --   --   --   --  122*  LABPROT  --  23.2*  --  27.3*  --  28.8*  INR  --  2.03  --  2.48  --  2.66  CREATININE 1.28*  --  1.05  --  1.15 0.86    Estimated Creatinine Clearance: 84.5 mL/min (by C-G formula based on SCr of 0.86 mg/dL).   Medical History: Past Medical History:  Diagnosis Date  . AAA (abdominal aortic aneurysm) (HCC)    4.2 X 4.8 cm 04/2013 CT; declined re-eval 02/25/15  . Anxiety   . CAD in native artery March 2007   Cath for dyspnea on exertion: 75% distal LM, 85% RI, 95% mid-distal Cx, in multiple RCA lesions with 95% distal. --> Referred for CABG; has declined further noninvasive evaluation in the absence of worsening symptoms  . COPD (chronic obstructive pulmonary disease) with emphysema (HCC) 05/23/2007   Hosp 5/29-6/01/12- COPD exacerbation ONOX 12/08/10- desaturated to less than 88% for over an hour, qualifying for home O2 during sleep    . Diverticulosis of colon with hemorrhage 05/24/2013  . Dyslipidemia, goal LDL below 70   . H/O echocardiogram March ; January 2015   a) Normal LV size and cavity appeared normal function. Grade 1 diastolic dysfunction. Limited study.; b) normal LV size and function with EF 60-65%. Aortic sclerosis without stenosis, mildly increased PA pressures of 44mmHg but normal IVC and RA/RV size  .  History of non-ST Elevation MI (myocardial infarction) March 2007   Admitted with COPD exacerbation, given by CHF. Had some chest pain with positive troponins. Cath showed multivessel disease, referred for CABG  . History of pneumonia   . Hypertension   . Hypoxia     chronic, on home O2  . Myocardial infarction   . Obesity (BMI 30-39.9)    One year ago weighed 265 pounds, (12/2012) -- now 234 pounds  . PAF (paroxysmal atrial fibrillation) (HCC)    Anticoagulated on warfarin. Stable -- post op  . Pneumonia    hx  . 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 Seasonal allergies   . Shortness of breath dyspnea      Assessment: 82 y.o. male with PMH significant for chronic resp failure (usually 2-3L of oxygen at baseline), CKD stage 3, HTN, HLD, CAD S/P CABG, obesity and PAF (on coumadin chronically); who presented with worsening of SOB and increase wheezing. Admitted for acute on chronic respiratory failure. Pharmacy consulted to manage warfarin.  Warfarin dose per anticoag clinic notes: 5mg  on M,W,F & Sat, 2.5mg  on T,TH,Sun  Today, 05/21/2016: - INR remains therapeutic - no reported bleeding - warfarin drug interactions: corticosteroid - concomitant  low-dose ASA for CAD noted - diet: heart healthy  Goal of Therapy:  INR 2-3   Plan:  1) Warfarin 5mg  this evening at 6pm as per home schedule above 2) Daily PT/INR while inpatient  Elie Goodyandy Tanasia Budzinski, PharmD, BCPS Pager: [phone] 05/21/2016  11:09 AM

## 2016-05-21 NOTE — Progress Notes (Signed)
Physical Therapy Treatment Patient Details Name: Roberto Reilly MRN: [id number] DOB: 09/19/1933 Today's Date: 05/21/2016    History of Present Illness 82 year old male with history of A. fib on warfarin, chronic respiratory failure on 2 L nasal cannula at baseline, CABG x4, R foot transmet amputation and admitted for Acute on chronic respiratory failure with hypoxia and hypercarbia.    PT Comments    SATURATION QUALIFICATIONS: (This note is used to comply with regulatory documentation for home oxygen)  Patient Saturations on Room Air at Rest =89 %  Patient Saturations on Room Air while Ambulating = 84%  Patient Saturations on 4 Liters of oxygen while Ambulating = 95%    Follow Up Recommendations  Home health PT     Equipment Recommendations  None recommended by PT    Recommendations for Other Services       Precautions / Restrictions Precautions Precautions: Fall Precaution Comments: pt states he wears 2 liters 02 prn; denies any falls in the last year    Mobility  Bed Mobility               General bed mobility comments: pt up in chair  Transfers Overall transfer level: Needs assistance Equipment used: Rolling walker (2 wheeled)   Sit to Stand: Supervision         General transfer comment: cues for slowing down  Ambulation/Gait Ambulation/Gait assistance: Min guard;Min assist Ambulation Distance (Feet): 120 Feet Assistive device: Straight cane Gait Pattern/deviations: Step-through pattern     General Gait Details: cues to slow down, for pursed lip breathing and standing rest after 60';  O2 sats 93% at rest on 4L, decr to 89% at rest on RA, decr to 84% on RA with amb, returned pt to O2 at 4L, sats back up to 95% on 4L   Stairs            Wheelchair Mobility    Modified Rankin (Stroke Patients Only)       Balance Overall balance assessment: Needs assistance         Standing balance support: Single extremity supported;During  functional activity Standing balance-Leahy Scale: Fair Standing balance comment: no LOB, although appears mildly unsteady during dynamic activities, denies any recent falls                    Cognition Arousal/Alertness: Awake/alert Behavior During Therapy: WFL for tasks assessed/performed Overall Cognitive Status: Within Functional Limits for tasks assessed                      Exercises      General Comments        Pertinent Vitals/Pain Pain Assessment: No/denies pain    Home Living                      Prior Function            PT Goals (current goals can now be found in the care plan section) Acute Rehab PT Goals Patient Stated Goal: to go home PT Goal Formulation: With patient Time For Goal Achievement: 05/26/16 Potential to Achieve Goals: Good Progress towards PT goals: Progressing toward goals    Frequency    Min 3X/week      PT Plan Current plan remains appropriate    Co-evaluation             End of Session Equipment Utilized During Treatment: Gait belt;Oxygen Activity Tolerance: Patient tolerated treatment well (fatigues quickly) Patient left:  in chair;with call bell/phone within reach;with chair alarm set     Time: 1529-1550 PT Time Calculation (min) (ACUTE ONLY): 21 min  Charges:  $Gait Training: 8-22 mins                    G Codes:      Roberto Reilly 05/21/2016, 4:17 PM

## 2016-05-21 NOTE — Evaluation (Signed)
PT Cancellation Note  Patient Details Name: Roberto ChristmasJames Tabron MRN: [id number] DOB: 01/30/1934   Cancelled Treatment:     Pt states he is just waiting to go home.  He says he does not need any PT and does fine taking care of himself.    Donnetta HailBrown, Giada Schoppe Krall 05/21/2016, 10:52 AM

## 2016-05-21 NOTE — Progress Notes (Signed)
PROGRESS NOTE  Roberto ChristmasJames Reilly ZOX:[id number] DOB: 10/01/1933 DOA: 05/18/2016 PCP: Pearson GrippeJames Kim, MD  Brief History:  82 y.o.malewith PMH significant for chronic resp failure (usually 2-3L of oxygen at baseline), CKD stage 2, HTN, HLD, CAD S/P CABG, Obesity and PAF (on coumadin chronically); who presented with 2 day history of SOB and increase wheezing. The patient was recently discharged from the hospital after a stay from 04/26/2016 through 04/30/2016 secondary to COPD exacerbation. However he has been around his son and daughter who have had upper respiratory type infections. The patient denies any fevers, chills, chest pain, nausea, vomiting, diarrhea, abdominal pain, dysuria. The patient has been compliant with all his inhalers and nebulizers without improvement. Upon presentation, the patient was noted to be hypoxemic and placed on BiPAP. He was unable to tolerate BiPAP and stated that he never wanted to be on it again. He was transitioned to a Venturi mask and remained clinically stable. Chest x-ray showed chronic interstitial markings with unchanged bibasilar opacities. The patient was started on intravenous steroids and bronchodilators for COPD exacerbation.  Assessment/Plan: Acute on chronic respiratory failure with hypoxia and hypercarbia -Secondary to COPD exacerbation -Presently stable and slowly improving; using 3.5-4L of [place] supplementaiton -Wean oxygen to keep saturation above 90% -continue Pulmonary hygiene  COPD exacerbation -due to RSV infection -respiratory viral panel--positive RSV---> slowly improving -Continue intravenous Solu-Medrol, Continue duo nebs; no  abx's for viral infection; will also continue brovana and pulmicort -planning slow prednisone taper when ready for d/c (1-2 days)  Paroxysmal atrial fibrillation -CHADS-VASc =4 -continue coumadin -rate controlled -pharmacy adjusting coumadin dose while inpatient   CKD2 -baseline creatinine  0.8-1.1 -stable -will monitor renal function trend   Hypertension -Continue amlodipine -BP stable  CAD in native artery - LM, RCA, RI &Cx disease -->CABG x 4 (LIMA-LAD, SVG-OM/RI, SVG-rPDA-rPL):  --Stable, no evidence of ACS -EKG and telemetry w/o acute abnormalities  -patient denies CP  Hyperkalemia -resolved now -K 4.7  AAA (abdominal aortic aneurysm) without rupture seen on CT scan:  - Stable -continue outpatient follow up   Obesity (BMI 30-39.9): - Patient meets criteria with BMI greater than 30 -low calorie diet recommended    Disposition Plan:   Home in 1-2 days  Family Communication:  No Family at bedside  Consultants:  none  Code Status:  DNR  DVT Prophylaxis:  warfarin   Procedures: As Listed in Progress Note Above  Antibiotics: Doxy 12/27>>12/29  Subjective: Patient is breathing a little better but still having dyspnea with mild exertion. Diffuse wheezing on exam. Desaturating on RA and ambulation (normally uses oxygen only at night according to him)  Objective: Vitals:   05/21/16 0538 05/21/16 0842 05/21/16 0847 05/21/16 1413  BP: 140/61   (!) 134/100  Pulse: 66   70  Resp: 20   20  Temp: 97.9 F (36.6 C)   98.1 F (36.7 C)  TempSrc: Oral   Oral  SpO2: 94% 93% 93% 91%  Weight: 101.5 kg (223 lb 12.3 oz)     Height:        Intake/Output Summary (Last 24 hours) at 05/21/16 1802 Last data filed at 05/21/16 1300  Gross per 24 hour  Intake              840 ml  Output             2125 ml  Net            -1285 ml  Weight change: 2.616 kg (5 lb 12.3 oz) Exam:   General:  Pt is alert, follows commands appropriately, not in acute distress; hard of hearing and still tachypneic and SOB while attempting to speak in full sentences. Using 3-4L [place] oxygen supplementation   HEENT: No icterus, No thrush, No neck mass, [place]/AT  Cardiovascular: RRR, S1/S2, no rubs, no gallops, positive SEM  Respiratory: improved air movement; positive  rhonchi and diffuse exp wheezing   Abdomen: Soft/+BS, non tender, non distended, no guarding  Extremities: trace LE edema, No lymphangitis, No petechiae, No rashes, no synovitis; right metatarsal amputation.   Data Reviewed: I have personally reviewed following labs and imaging studies  Basic Metabolic Panel:  Recent Labs Lab 05/18/16 1209 05/19/16 0540 05/20/16 1514 05/21/16 0517  NA 141 141 137 138  K 4.4 5.4* 4.7 4.7  CL 101 102 98* 98*  CO2 32 31 31 31   GLUCOSE 129* 166* 160* 126*  BUN 27* 31* 39* 34*  CREATININE 1.28* 1.05 1.15 0.86  CALCIUM 8.1* 8.2* 8.3* 8.2*  MG 2.5*  --   --   --   PHOS 3.5  --   --   --    Liver Function Tests:  Recent Labs Lab 05/18/16 1209  AST 25  ALT 19  ALKPHOS 61  BILITOT 0.6  PROT 7.0  ALBUMIN 4.2   Coagulation Profile:  Recent Labs Lab 05/18/16 1130 05/19/16 0441 05/20/16 0539 05/21/16 0517  INR 2.05 2.03 2.48 2.66   CBC:  Recent Labs Lab 05/18/16 1209 05/21/16 0517  WBC 7.6 10.6*  HGB 14.7 12.9*  HCT 47.7 39.7  MCV 93.3 88.4  PLT 115* 122*   Urine analysis:    Component Value Date/Time   COLORURINE AMBER (A) 11/01/2015 2306   APPEARANCEUR CLEAR 11/01/2015 2306   LABSPEC 1.020 11/01/2015 2306   PHURINE 5.0 11/01/2015 2306   GLUCOSEU NEGATIVE 11/01/2015 2306   HGBUR NEGATIVE 11/01/2015 2306   BILIRUBINUR SMALL (A) 11/01/2015 2306   KETONESUR 15 (A) 11/01/2015 2306   PROTEINUR NEGATIVE 11/01/2015 2306   UROBILINOGEN 0.2 10/19/2010 1150   NITRITE NEGATIVE 11/01/2015 2306   LEUKOCYTESUR NEGATIVE 11/01/2015 2306   Sepsis Labs: @LABRCNTIP (procalcitonin:4,lacticidven:4) ) Recent Results (from the past 240 hour(s))  Respiratory Panel by PCR     Status: Abnormal   Collection Time: 05/19/16  2:07 PM  Result Value Ref Range Status   Adenovirus NOT DETECTED NOT DETECTED Final   Coronavirus 229E NOT DETECTED NOT DETECTED Final   Coronavirus HKU1 NOT DETECTED NOT DETECTED Final   Coronavirus NL63 NOT  DETECTED NOT DETECTED Final   Coronavirus OC43 NOT DETECTED NOT DETECTED Final   Metapneumovirus NOT DETECTED NOT DETECTED Final   Rhinovirus / Enterovirus NOT DETECTED NOT DETECTED Final   Influenza A NOT DETECTED NOT DETECTED Final   Influenza B NOT DETECTED NOT DETECTED Final   Parainfluenza Virus 1 NOT DETECTED NOT DETECTED Final   Parainfluenza Virus 2 NOT DETECTED NOT DETECTED Final   Parainfluenza Virus 3 NOT DETECTED NOT DETECTED Final   Parainfluenza Virus 4 NOT DETECTED NOT DETECTED Final   Respiratory Syncytial Virus DETECTED (A) NOT DETECTED Final    Comment: CRITICAL RESULT CALLED TO, READ BACK BY AND VERIFIED WITH: Julio Sicks RN 13:00 05/20/16 ([email or web address])    Bordetella pertussis NOT DETECTED NOT DETECTED Final   Chlamydophila pneumoniae NOT DETECTED NOT DETECTED Final   Mycoplasma pneumoniae NOT DETECTED NOT DETECTED Final    Comment: Performed at [facility]  Scheduled Meds: . amLODipine  10 mg Oral Daily  . arformoterol  15 mcg Nebulization BID  . aspirin EC  81 mg Oral Daily  . budesonide (PULMICORT) nebulizer solution  0.5 mg Nebulization BID  . cholecalciferol  1,000 Units Oral Daily  . furosemide  40 mg Oral q morning - 10a  . ipratropium-albuterol  3 mL Nebulization QID  . methylPREDNISolone (SOLU-MEDROL) injection  60 mg Intravenous Q8H  . pantoprazole  40 mg Oral Daily  . simvastatin  20 mg Oral q1800  . sodium chloride flush  3 mL Intravenous Q12H  . warfarin  5 mg Oral ONCE-1800  . Warfarin - Pharmacist Dosing Inpatient   Does not apply q1800   Continuous Infusions:  Procedures/Studies: Dg Chest 2 View  Result Date: 04/28/2016 CLINICAL DATA:  Dyspnea and cough. EXAM: CHEST  2 VIEW COMPARISON:  04/26/2016 CXR FINDINGS: Patient status post median sternotomy and CABG. Heart is enlarged but stable. No aortic arch aneurysm. Chronic blunting and pleural thickening at the right lung base. Diffuse mild interstitial prominence is noted which may  reflect bronchitic change. Old left-sided rib fractures are identified involving the left fifth through seventh ribs. EKG leads project over the right medial hemithorax and mediastinum. IMPRESSION: Stable right basilar scarring and pleural thickening. Bilateral increased interstitial prominence of the lungs without pneumonic consolidations. Mild bronchitic change is suspected. Electronically Signed   By: Tollie Ethavid  Kwon M.D.   On: 04/28/2016 15:58   Dg Chest 2 View  Result Date: 04/26/2016 CLINICAL DATA:  Shortness of breath, productive cough for 1 week. EXAM: CHEST  2 VIEW COMPARISON:  11/03/2015 FINDINGS: Prior CABG. Volume loss on the right with probable scarring at the right base. Postoperative changes on the right. No confluent airspace opacities. Heart is borderline in size. No acute bony abnormality. IMPRESSION: Postoperative changes and volume loss on the right with stable chronic changes of the right lung base. No acute findings. Electronically Signed   By: Charlett NoseKevin  Dover M.D.   On: 04/26/2016 13:09   Dg Chest Port 1 View  Result Date: 05/19/2016 CLINICAL DATA:  COPD, dyspnea and respiratory failure EXAM: PORTABLE CHEST 1 VIEW COMPARISON:  05/18/2016 FINDINGS: Mild chronic interstitial prominence with chronic blunting the right costophrenic angle. The heart is borderline enlarged. The patient is status post CABG. Bilateral old rib fractures are noted. No overt pulmonary edema. IMPRESSION: No significant change from prior exam. Chronic blunting of the right costophrenic angle with bilateral chronic rib fractures. No acute cardiopulmonary disease. Electronically Signed   By: Tollie Ethavid  Kwon M.D.   On: 05/19/2016 02:16   Dg Chest Portable 1 View  Result Date: 05/18/2016 CLINICAL DATA:  Shortness of breath and wheezing. COPD exacerbation. EXAM: PORTABLE CHEST 1 VIEW COMPARISON:  04/28/2016 FINDINGS: The heart size and pulmonary vascularity are normal. There is chronic blunting of the right costophrenic  angle. Multiple old bilateral rib fractures. CABG. IMPRESSION: No acute abnormalities.  Chronic changes as described. Electronically Signed   By: Francene BoyersJames  Maxwell M.D.   On: 05/18/2016 13:32    Vassie LollMadera, Toris Laverdiere, MD  [facility] Pager [phone]  If 7PM-7AM, please contact night-coverage [email or web address] Password [email or web address] 05/21/2016, 6:02 PM   LOS: 3 days

## 2016-05-22 LAB — PROTIME-INR
INR: 3.08
Prothrombin Time: 32.5 seconds — ABNORMAL HIGH (ref 11.4–15.2)

## 2016-05-22 MED ORDER — PREDNISONE 10 MG PO TABS: ORAL_TABLET | ORAL | 0 refills | Status: DC

## 2016-05-22 MED ORDER — ARFORMOTEROL TARTRATE 15 MCG/2ML IN NEBU: 15.0000 ug | INHALATION_SOLUTION | Freq: Two times a day (BID) | RESPIRATORY_TRACT | 2 refills | Status: DC

## 2016-05-22 NOTE — Progress Notes (Signed)
ANTICOAGULATION CONSULT NOTE - Follow up  Pharmacy Consult for warfarin Indication: atrial fibrillation  Allergies  Allergen Reactions  . Prednisone Other (See Comments)    Makes pt jittery and nervous    Patient Measurements: Height: 6' (182.9 cm) Weight: 221 lb 9 oz (100.5 kg) IBW/kg (Calculated) : 77.6  Vital Signs: Temp: 97.9 F (36.6 C) (12/31 0543) Temp Source: Oral (12/31 0543) BP: 138/78 (12/31 0543) Pulse Rate: 81 (12/31 0543)  Labs:  Recent Labs  05/20/16 0539 05/20/16 1514 05/21/16 0517 05/22/16 0535  HGB  --   --  12.9*  --   HCT  --   --  39.7  --   PLT  --   --  122*  --   LABPROT 27.3*  --  28.8* 32.5*  INR 2.48  --  2.66 3.08  CREATININE  --  1.15 0.86  --     Estimated Creatinine Clearance: 84.1 mL/min (by C-G formula based on SCr of 0.86 mg/dL).   Medical History: Past Medical History:  Diagnosis Date  . AAA (abdominal aortic aneurysm) (HCC)    4.2 X 4.8 cm 04/2013 CT; declined re-eval 02/25/15  . Anxiety   . CAD in native artery March 2007   Cath for dyspnea on exertion: 75% distal LM, 85% RI, 95% mid-distal Cx, in multiple RCA lesions with 95% distal. --> Referred for CABG; has declined further noninvasive evaluation in the absence of worsening symptoms  . COPD (chronic obstructive pulmonary disease) with emphysema (HCC) 05/23/2007   Hosp 5/29-6/01/12- COPD exacerbation ONOX 12/08/10- desaturated to less than 88% for over an hour, qualifying for home O2 during sleep    . Diverticulosis of colon with hemorrhage 05/24/2013  . Dyslipidemia, goal LDL below 70   . H/O echocardiogram March ; January 2015   a) Normal LV size and cavity appeared normal function. Grade 1 diastolic dysfunction. Limited study.; b) normal LV size and function with EF 60-65%. Aortic sclerosis without stenosis, mildly increased PA pressures of 44mmHg but normal IVC and RA/RV size  . History of non-ST Elevation MI (myocardial infarction) March 2007   Admitted with COPD  exacerbation, given by CHF. Had some chest pain with positive troponins. Cath showed multivessel disease, referred for CABG  . History of pneumonia   . Hypertension   . Hypoxia     chronic, on home O2  . Myocardial infarction   . Obesity (BMI 30-39.9)    One year ago weighed 265 pounds, (12/2012) -- now 234 pounds  . PAF (paroxysmal atrial fibrillation) (HCC)    Anticoagulated on warfarin. Stable -- post op  . Pneumonia    hx  . 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 Seasonal allergies   . Shortness of breath dyspnea      Assessment: 82 y.o. male with PMH significant for chronic resp failure (usually 2-3L of oxygen at baseline), CKD stage 3, HTN, HLD, CAD S/P CABG, obesity and PAF (on Coumadin chronically); who presented with worsening of SOB and increase wheezing. Admitted for acute on chronic respiratory failure. Pharmacy consulted to manage warfarin.  Warfarin dose per anticoag clinic notes: 5mg  on M,W,F & Sat, 2.5mg  on T,TH,Sun  Today, 05/22/2016: - INR rising, now supratherapeutic - no reported bleeding - warfarin drug interactions: corticosteroid - concomitant low-dose ASA for CAD noted - diet: heart healthy  Goal of Therapy:  INR 2-3   Plan:  1) No warfarin today 2) Daily PT/INR while inpatient  3) Monitor for reports of bleeding  Elie Goodyandy Gurman [place], PharmD, BCPS Pager: [phone] 05/22/2016  10:26 AM

## 2016-05-22 NOTE — Discharge Summary (Signed)
Physician Discharge Summary  Roberto Reilly ZOX:[id number] DOB: 05-21-1934 DOA: 05/18/2016  PCP: Pearson Grippe, MD  Admit date: 05/18/2016 Discharge date: 05/22/2016  Time spent: 35 minutes  Recommendations for Outpatient Follow-up:  Patient needs further education about medication compliance; as he has been not faithful to follow instructions and medications as prescribed. Taking everything as needed (when is schedule) and not using his Oxygen supplementation as instructed. Repeat BMET to follow electrolytes and renal function. Please assure patient follow up with pulmonologist for further adjustment on his maintenance therapy for COPD. Check INR and CBC to follow coumadin level and Hgb trend  Discharge Diagnoses:  Principal Problem:   Acute and chronic respiratory failure with hypoxia (HCC) Active Problems:   Obesity (BMI 30-39.9)   S/P CABG x 4   Paroxysmal atrial fibrillation (HCC); CHA2DS2-VASc Score = 4. On Warfarin   Essential hypertension   COPD with acute exacerbation (HCC)   COPD exacerbation (HCC)   Acute on chronic respiratory failure (HCC)   Shortness of breath   Chronic renal failure syndrome, stage 3 (moderate)   Hypercapnia   Acute on chronic respiratory failure with hypoxia and hypercapnia (HCC)   Acute bronchitis due to respiratory syncytial virus (RSV)   Discharge Condition: stable and improved. Discharge home with instructions to follow up with PCP and pulmonologist as an outpatient.  Diet recommendation: low calorie diet and heart healthy   Filed Weights   05/20/16 0614 05/21/16 0538 05/22/16 0543  Weight: 102.6 kg (226 lb 3.2 oz) 101.5 kg (223 lb 12.3 oz) 100.5 kg (221 lb 9 oz)    History of present illness:  82 y.o. male with PMH significant for chronic resp failure (usually 2-3L of oxygen at baseline), CKD stage 3, HTN, HLD, CAD S/P CABG, Obesity and PAF (on coumadin chronically); who presented with worsening of SOB and increase wheezing. Patient reports no  fever, no CP, no hemoptysis or drastic changes on coughing spells. Endorses increase wheezing and SOB (at rest and worse with exertion). Patient has increased use of nebulizer treatment at home and also increase in his O2 supplementation, without relief. Patient denies abd pain, HA, blurred vision, dysuria, focal weakness or any other complaints.   Hospital Course:  Acute on chronic respiratory failure with hypoxia and hypercarbia -Secondary to COPD exacerbation -Presently stable and steadily improving; using 3.5-4L of [place] supplementaiton -Will discharge on oxygen supplementation to keep saturation above 90-92% -continue Pulmonary hygiene -outpatient follow up with pulmonary service   COPD exacerbation -due to RSV infection -respiratory viral panel--positive RSV---> improving and at discharge stable to complete outpatient therapy -discharge on slow steroids tapering, continue use of home symbicort, started on brovana and instructed to continue using his Duoneb as previously prescribed -patient will also continue oxygen supplementation -instructed to use Flutter valve and to follow up with pulmonary service as instructed prior to admission -no antibiotics needed for viral infection and no infiltrates on his CXR  Paroxysmal atrial fibrillation -CHADS-VASc =4 -continue coumadin; further adjustments to be done as needed to maintain INR at goal. -rate controlled -follow up INR and CBC as an outpatient  CKD 2-3 -baseline creatinine 0.8-1.1 -stable overall -will recommend BMET to monitor renal function trend   Hypertension -Continue amlodipine -BP stable -advise to follow heart healthy diet   CAD in native artery -LM, RCA, RI &Cx disease -->CABG x 4 (LIMA-LAD, SVG-OM/RI, SVG-rPDA-rPL):  --Stable, no evidence of ACS -EKG and telemetry w/o acute abnormalities  -patient denies CP  Hyperkalemia -resolved by time of discharge -  K 4.7  AAA (abdominal aortic aneurysm) without  rupture seen on CT scan:  -Stable -continue outpatient follow up   Obesity (BMI 30-39.9): - Patient meets criteria with BMI greater than 30 -low calorie diet recommended    Procedures:  See below for x-ray reports   Consultations:  None   Discharge Exam: Vitals:   05/21/16 2022 05/22/16 0543  BP: 136/63 138/78  Pulse: 64 81  Resp: 20 16  Temp: 98.6 F (37 C) 97.9 F (36.6 C)    General:  Pt is alert, follows commands appropriately, not in acute distress; hard of hearing and reporting improvement in his breathing and activity tolerance. Still with mild exp wheezing, but good O2 sat on current oxygen supplementation and no tachypnea. Denies CP and wants to go home.    HEENT: No icterus, No thrush, No neck mass, [place]/AT  Cardiovascular: RRR, S1/S2, no rubs, no gallops, positive SEM  Respiratory: improved air movement; positive rhonchi and scattered mild exp wheezing   Abdomen: Soft/+BS, non tender, non distended, no guarding  Extremities: trace LE edema, No lymphangitis, No petechiae, No rashes, no synovitis; right metatarsal amputation.    Discharge Instructions   Discharge Instructions    Diet - low sodium heart healthy    Complete by:  As directed    Discharge instructions    Complete by:  As directed    Take medications as described  Oxygen to be wear 3L at rest and 3.5-4L with activity (24/7) Follow heart healthy diet  Continue CPAP use at bedtime Follow up with PCP and pulmonologist as instructed     Current Discharge Medication List    START taking these medications   Details  arformoterol (BROVANA) 15 MCG/2ML NEBU Take 2 mLs (15 mcg total) by nebulization 2 (two) times daily. Qty: 120 mL, Refills: 2      CONTINUE these medications which have CHANGED   Details  predniSONE (DELTASONE) 10 MG tablet Please take 50 mg oral daily for 3 days, then 40 mg oral daily for 3 days, then 30 mg oral daily for 3 days, then 20 mg oral daily for 3 days, then 10  mg oral daily for 3 days, then stop prednisone Qty: 45 tablet, Refills: 0      CONTINUE these medications which have NOT CHANGED   Details  amLODipine (NORVASC) 10 MG tablet Take 1 tablet (10 mg total) by mouth daily. Qty: 30 tablet, Refills: 1    aspirin EC 81 MG tablet Take 81 mg by mouth every morning.     budesonide-formoterol (SYMBICORT) 160-4.5 MCG/ACT inhaler Inhale 2 puffs into the lungs 2 (two) times daily. Qty: 2 Inhaler, Refills: 0    cholecalciferol (VITAMIN D) 1000 UNITS tablet Take 1,000 Units by mouth every morning.     furosemide (LASIX) 40 MG tablet TAKE ONE TABLET BY MOUTH EVERY MORNING Qty: 90 tablet, Refills: 1    ipratropium-albuterol (DUONEB) 0.5-2.5 (3) MG/3ML SOLN Take 3 mLs by nebulization every 6 (six) hours as needed (wheezing and shortness of breath).     pantoprazole (PROTONIX) 40 MG tablet Take 1 tablet (40 mg total) by mouth daily. Qty: 30 tablet, Refills: 0    potassium chloride SA (K-DUR,KLOR-CON) 20 MEQ tablet Take 20 mEq by mouth daily.    simvastatin (ZOCOR) 40 MG tablet Take 20 mg by mouth every morning.     warfarin (COUMADIN) 2.5 MG tablet Take 1-2 tablets by mouth daily as directed by coumadin clinic Qty: 150 tablet, Refills: 1  OXYGEN Inhale 2 L into the lungs as needed (shortness of breath).        Allergies  Allergen Reactions  . Prednisone Other (See Comments)    Makes pt jittery and nervous   Follow-up Information    Pearson Grippe, MD Follow up today.   Specialty:  Internal Medicine Why:  follow up with PCP as previosuly scheduled  Contact information: [address] [place] [address] [place] [address] [phone]            The results of significant diagnostics from this hospitalization (including imaging, microbiology, ancillary and laboratory) are listed below for reference.    Significant Diagnostic Studies: Dg Chest 2 View  Result Date: 04/28/2016 CLINICAL DATA:  Dyspnea and cough. EXAM: CHEST  2 VIEW  COMPARISON:  04/26/2016 CXR FINDINGS: Patient status post median sternotomy and CABG. Heart is enlarged but stable. No aortic arch aneurysm. Chronic blunting and pleural thickening at the right lung base. Diffuse mild interstitial prominence is noted which may reflect bronchitic change. Old left-sided rib fractures are identified involving the left fifth through seventh ribs. EKG leads project over the right medial hemithorax and mediastinum. IMPRESSION: Stable right basilar scarring and pleural thickening. Bilateral increased interstitial prominence of the lungs without pneumonic consolidations. Mild bronchitic change is suspected. Electronically Signed   By: Tollie Eth M.D.   On: 04/28/2016 15:58   Dg Chest 2 View  Result Date: 04/26/2016 CLINICAL DATA:  Shortness of breath, productive cough for 1 week. EXAM: CHEST  2 VIEW COMPARISON:  11/03/2015 FINDINGS: Prior CABG. Volume loss on the right with probable scarring at the right base. Postoperative changes on the right. No confluent airspace opacities. Heart is borderline in size. No acute bony abnormality. IMPRESSION: Postoperative changes and volume loss on the right with stable chronic changes of the right lung base. No acute findings. Electronically Signed   By: Charlett Nose M.D.   On: 04/26/2016 13:09   Dg Chest Port 1 View  Result Date: 05/19/2016 CLINICAL DATA:  COPD, dyspnea and respiratory failure EXAM: PORTABLE CHEST 1 VIEW COMPARISON:  05/18/2016 FINDINGS: Mild chronic interstitial prominence with chronic blunting the right costophrenic angle. The heart is borderline enlarged. The patient is status post CABG. Bilateral old rib fractures are noted. No overt pulmonary edema. IMPRESSION: No significant change from prior exam. Chronic blunting of the right costophrenic angle with bilateral chronic rib fractures. No acute cardiopulmonary disease. Electronically Signed   By: Tollie Eth M.D.   On: 05/19/2016 02:16   Dg Chest Portable 1  View  Result Date: 05/18/2016 CLINICAL DATA:  Shortness of breath and wheezing. COPD exacerbation. EXAM: PORTABLE CHEST 1 VIEW COMPARISON:  04/28/2016 FINDINGS: The heart size and pulmonary vascularity are normal. There is chronic blunting of the right costophrenic angle. Multiple old bilateral rib fractures. CABG. IMPRESSION: No acute abnormalities.  Chronic changes as described. Electronically Signed   By: Francene Boyers M.D.   On: 05/18/2016 13:32    Microbiology: Recent Results (from the past 240 hour(s))  Respiratory Panel by PCR     Status: Abnormal   Collection Time: 05/19/16  2:07 PM  Result Value Ref Range Status   Adenovirus NOT DETECTED NOT DETECTED Final   Coronavirus 229E NOT DETECTED NOT DETECTED Final   Coronavirus HKU1 NOT DETECTED NOT DETECTED Final   Coronavirus NL63 NOT DETECTED NOT DETECTED Final   Coronavirus OC43 NOT DETECTED NOT DETECTED Final   Metapneumovirus NOT DETECTED NOT DETECTED Final   Rhinovirus / Enterovirus NOT  DETECTED NOT DETECTED Final   Influenza A NOT DETECTED NOT DETECTED Final   Influenza B NOT DETECTED NOT DETECTED Final   Parainfluenza Virus 1 NOT DETECTED NOT DETECTED Final   Parainfluenza Virus 2 NOT DETECTED NOT DETECTED Final   Parainfluenza Virus 3 NOT DETECTED NOT DETECTED Final   Parainfluenza Virus 4 NOT DETECTED NOT DETECTED Final   Respiratory Syncytial Virus DETECTED (A) NOT DETECTED Final    Comment: CRITICAL RESULT CALLED TO, READ BACK BY AND VERIFIED WITH: Julio SicksK. Phillips RN 13:00 05/20/16 ([email or web address])    Bordetella pertussis NOT DETECTED NOT DETECTED Final   Chlamydophila pneumoniae NOT DETECTED NOT DETECTED Final   Mycoplasma pneumoniae NOT DETECTED NOT DETECTED Final    Comment[facility]     Labs: Basic Metabolic Panel:  Recent Labs Lab 05/18/16 1209 05/19/16 0540 05/20/16 1514 05/21/16 0517  NA 141 141 137 138  K 4.4 5.4* 4.7 4.7  CL 101 102 98* 98*  CO2 32 31 31 31   GLUCOSE 129* 166* 160*  126*  BUN 27* 31* 39* 34*  CREATININE 1.28* 1.05 1.15 0.86  CALCIUM 8.1* 8.2* 8.3* 8.2*  MG 2.5*  --   --   --   PHOS 3.5  --   --   --    Liver Function Tests:  Recent Labs Lab 05/18/16 1209  AST 25  ALT 19  ALKPHOS 61  BILITOT 0.6  PROT 7.0  ALBUMIN 4.2   No results for input(s): LIPASE, AMYLASE in the last 168 hours. No results for input(s): AMMONIA in the last 168 hours. CBC:  Recent Labs Lab 05/18/16 1209 05/21/16 0517  WBC 7.6 10.6*  HGB 14.7 12.9*  HCT 47.7 39.7  MCV 93.3 88.4  PLT 115* 122*   Cardiac Enzymes: No results for input(s): CKTOTAL, CKMB, CKMBINDEX, TROPONINI in the last 168 hours. BNP: BNP (last 3 results)  Recent Labs  11/02/15 0453 04/26/16 1232 05/18/16 1209  BNP 710.5* 247.3* 249.8*    Signed:  Vassie LollMadera, Kyli Sorter MD.  [facility] 05/22/2016, 2:40 PM

## 2016-05-22 NOTE — Progress Notes (Signed)
Rt gave pt flutter valve. Pt knows and understands how to use. 

## 2016-05-22 NOTE — Care Management Important Message (Signed)
Important Message  Patient Details  Name: Roberto Reilly MRN: [id number] Date of Birth: 03/14/1934   Medicare Important Message Given:  Yes    Elliot CousinShavis, Rosemary Pentecost Ellen, RN 05/22/2016, 4:04 PM

## 2016-05-31 DIAGNOSIS — Z125 Encounter for screening for malignant neoplasm of prostate: Secondary | ICD-10-CM | POA: Diagnosis not present

## 2016-05-31 DIAGNOSIS — J449 Chronic obstructive pulmonary disease, unspecified: Secondary | ICD-10-CM | POA: Diagnosis not present

## 2016-05-31 DIAGNOSIS — I10 Essential (primary) hypertension: Secondary | ICD-10-CM | POA: Diagnosis not present

## 2016-05-31 DIAGNOSIS — Z0000 Encounter for general adult medical examination without abnormal findings: Secondary | ICD-10-CM | POA: Diagnosis not present

## 2016-05-31 DIAGNOSIS — I480 Paroxysmal atrial fibrillation: Secondary | ICD-10-CM | POA: Diagnosis not present

## 2016-06-02 ENCOUNTER — Encounter: Payer: Self-pay | Admitting: Internal Medicine

## 2016-06-02 ENCOUNTER — Encounter: Payer: Self-pay | Admitting: *Deleted

## 2016-06-02 ENCOUNTER — Ambulatory Visit (INDEPENDENT_AMBULATORY_CARE_PROVIDER_SITE_OTHER): Payer: Medicare Other | Admitting: Internal Medicine

## 2016-06-02 DIAGNOSIS — J449 Chronic obstructive pulmonary disease, unspecified: Secondary | ICD-10-CM | POA: Diagnosis not present

## 2016-06-02 DIAGNOSIS — J9621 Acute and chronic respiratory failure with hypoxia: Secondary | ICD-10-CM

## 2016-06-02 DIAGNOSIS — J9622 Acute and chronic respiratory failure with hypercapnia: Secondary | ICD-10-CM | POA: Diagnosis not present

## 2016-06-02 NOTE — Progress Notes (Signed)
Patient ID: Roberto Reilly, male    DOB: 1933/09/18, 83 y.o.   MRN: [id number]  HPI male former smoker followed for COPD, complicated by CAD, diastolic heart failure, PAF/chronic anticoagulation, HBP, obesity, old right thoracotomy   ---------------------------------------------------  11/26/2015-83 year old male former smoker followed for COPD, complicated by CAD, diastolic heart failure, PAF/chronic anticoagulation, HBP, obesity, old right thoracotomy O2 2 L/Advanced FOLLOWS FOR: DME: [facility] for O2; wears with acitivity. Has had toruble with breathing lately with heat. Pt was rencently in [facility] for PNA and prior to that he went in for ambutation of part of his right foot. Pt states he is not sleeping well either.  Saturation today at rest on room air 88%.  Bed year-wife died, he was hospitalized with H. influenzae pneumonia 6/11-6/15- Augmentin. Had toes amputated right foot nonhealing ulcer. Cough not productive white sputum but he feels pneumonia not completely resolved and asks for additional Augmentin. Denies fever/chills. No chest pain or blood. CXR 11/03/2015 IMPRESSION: Stable right basilar scarring and pleural thickening is noted. No significant changes noted compared to prior exam. Electronically Signed  By: Lupita Raider, M.D.  On: 11/03/2015 12:56   06/02/2016-83 year old male former smoker followed for COPD, complicated by CAD/CABG, diastolic heart failure, PAF/chronic anticoagulation/ Warfarin, HBP, obesity, old right thoracotomy, CKD III O2 2 L/Advanced FOLLOWS FOR: Pt was in hospital due to SOB in Dec 2017. States he continues to have hard time with SOB and wheezing with exertion. Pt uses O2 at home as rx'd. Just got POC. Post Hospital follow-up-admitted 12/27-12/31/2017 for exacerbation COPD Finances limit medication access. Had toes amputated Breathing now at baseline with little cough, routine DOE. CXR 05/19/2016- IMPRESSION: No significant change from prior exam.  Chronic blunting of the right costophrenic angle with bilateral chronic rib fractures. No acute cardiopulmonary disease. // needs documented PFT/ spirometry//  Review of Systems- see HPI   + = pos Constitutional:   No-   weight loss, night sweats, fevers, chills, fatigue, lassitude. HEENT:   No-   headaches, difficulty swallowing, tooth/dental problems, sore throat,                  No-   sneezing, itching, ear ache,+ nasal congestion, post nasal drip,  CV:  No-   chest pain, orthopnea, PND, swelling in lower extremities, anasarca, dizziness, palpitations GI:  No-   heartburn, indigestion, abdominal pain, nausea, vomiting,  Resp: , per HPI, +Chronic cough              No-  coughing up of blood.               change in color of mucus.  +occasional wheezing.   Skin: No-   rash or lesions. GU: . MS:  No-   joint pain or swelling.   Psych:  + change in mood or affect. + depression or anxiety.  No memory loss.   Objective:   Physical Exam    O2 2L General- Alert, Oriented, Affect-appropriate, Distress- none acute   + Overweight., + Tripod posture, +pursed lips  Skin- . + coumadin bruising Lymphadenopathy- none Head- atraumatic            Eyes- Gross vision intact, PERRLA, conjunctivae clear secretions            Ears- + Hard of hearing            Nose- Clear, No-Septal dev, mucus, polyps, erosion, perforation             Throat-  Mallampati II , mucosa clear , drainage- none, tonsils- atrophic, +dentures, +hoarse Neck- flexible , trachea midline, no stridor , thyroid nl, carotid no bruit Chest - symmetrical excursion , unlabored           Heart/CV-+ I RR( no pacer) , no murmur , no gallop  , no rub, nl s1 s2                           - JVD- none , edema- none, stasis changes- none, varices- none           Lung- +coarse breath sounds,  wheeze +end expiratory , no- cough, dullness-none, rub- none   unlabored,            Chest wall- sternal scar Abd-  Br/ Gen/ Rectal- Not done, not  indicated Extrem- + external varices on legs,  + stasis changes Neuro- grossly intact to observation

## 2016-06-02 NOTE — Patient Instructions (Signed)
Samples x 2 Symbicort 160  Remember the Brovana nebulizer solution is meant to be longer-lasting, so you can go 8-12 hours between nebulizer treatments when you want to.   Glad you got the portable oxygen concentrator. We can keep it on 20-3 Liters flow rate.  Please call as needed

## 2016-06-22 ENCOUNTER — Ambulatory Visit (INDEPENDENT_AMBULATORY_CARE_PROVIDER_SITE_OTHER): Payer: Medicare Other | Admitting: Pharmacist

## 2016-06-22 DIAGNOSIS — I4891 Unspecified atrial fibrillation: Secondary | ICD-10-CM | POA: Diagnosis not present

## 2016-06-22 DIAGNOSIS — Z7901 Long term (current) use of anticoagulants: Secondary | ICD-10-CM

## 2016-06-22 LAB — POCT INR: INR: 2

## 2016-06-29 NOTE — Assessment & Plan Note (Signed)
He is near baseline with severe COPD, chronically oxygen dependent. I don't find documented airflow assessment-spirometry or PFT. He usually comes in either with acute exacerbation or pleading insufficient funds but we need to get some kind of documentation. Plan-office spirometry next visit

## 2016-06-29 NOTE — Assessment & Plan Note (Signed)
Nearing baseline after recent hospitalization for COPD exacerbation. Now has portable oxygen concentrator which he is managing well. He will remain dependent on oxygen.

## 2016-07-26 ENCOUNTER — Ambulatory Visit (INDEPENDENT_AMBULATORY_CARE_PROVIDER_SITE_OTHER): Payer: Medicare Other | Admitting: Pharmacist

## 2016-07-26 ENCOUNTER — Encounter: Payer: Self-pay | Admitting: Cardiology

## 2016-07-26 ENCOUNTER — Ambulatory Visit (INDEPENDENT_AMBULATORY_CARE_PROVIDER_SITE_OTHER): Payer: Medicare Other | Admitting: Cardiology

## 2016-07-26 VITALS — BP 130/76 | HR 81 | Ht 72.0 in | Wt 231.0 lb

## 2016-07-26 DIAGNOSIS — I4891 Unspecified atrial fibrillation: Secondary | ICD-10-CM | POA: Diagnosis not present

## 2016-07-26 DIAGNOSIS — Z7901 Long term (current) use of anticoagulants: Secondary | ICD-10-CM

## 2016-07-26 DIAGNOSIS — E669 Obesity, unspecified: Secondary | ICD-10-CM

## 2016-07-26 DIAGNOSIS — I2510 Atherosclerotic heart disease of native coronary artery without angina pectoris: Secondary | ICD-10-CM | POA: Diagnosis not present

## 2016-07-26 DIAGNOSIS — Z951 Presence of aortocoronary bypass graft: Secondary | ICD-10-CM | POA: Diagnosis not present

## 2016-07-26 DIAGNOSIS — E785 Hyperlipidemia, unspecified: Secondary | ICD-10-CM | POA: Diagnosis not present

## 2016-07-26 DIAGNOSIS — I480 Paroxysmal atrial fibrillation: Secondary | ICD-10-CM | POA: Diagnosis not present

## 2016-07-26 DIAGNOSIS — I10 Essential (primary) hypertension: Secondary | ICD-10-CM | POA: Diagnosis not present

## 2016-07-26 LAB — POCT INR: INR: 1.6

## 2016-07-26 NOTE — Patient Instructions (Addendum)
NO CHANGES WITH CURRENT TREATMENT     Your physician wants you to follow-up in 6 MONTHS WITH Dr Herbie BaltimoreHarding.You will receive a reminder letter in the mail two months in advance. If you don't receive a letter, please call our office to schedule the follow-up appointment.    If you need a refill on your cardiac medications before your next appointment, please call your pharmacy.

## 2016-07-26 NOTE — Progress Notes (Signed)
PCP: Pearson Grippe, MD  Clinic Note: Chief Complaint  Patient presents with  . Follow-up    gets tired and fatigue (COPD). SHOB with and without Activity. uses O2 and Inhalers as needed for [facility] and COPD.  Marland Kitchen Coronary Artery Disease  . PAD    on warfarin    HPI: Roberto Reilly is a 83 y.o. male with a PMH below who presents today for ~ annual f/u for CAD-CABG & PAF along with PAD. He is a former patient of Dr. Caprice Kluver. He has a history of CABG in 2007 following catheterization catheterization for dyspnea on exertion. He also has long-standing intermittently oxygen requiring COPD. He has been reluctant to undergo non-invasive ischemic evaluations in the past unless symptoms warrant.  Shameer Molstad was last seen on   Recent Hospitalizations:   May - partial foot amputation (across metatarsals) for great toe infection / Osteo  In June 2017 for PNA (shortly after his wife was hospitalized & died) - was after wife's funeral ; was run down having to deal with wife's death - poor sleep, lots of running around   Studies Reviewed:  Echo 10/2015:  - Left ventricle: The cavity size was normal. There was mild focal  basal hypertrophy of the septum. Septal bounce. Post op septal hypokinesis. Systolic function was normal. EF 55% to 60%. Wall motion was normal;There was no   evidence of elevated ventricular filling pressure by Doppler parameters. - Left atrium: The atrium was mildly dilated.  Abd [place]  Saw Arida 02/2016 - rec'd med Management for PAD  December [facility] x 2: COPD related - no Rx of PNA or CHF   12/5-9: COPD exacerbation - A on C hypoxic RF -- steroids & nebs.  12/27-31: again COPD (RSV Infxn)  Interval History: Trystan presents today relatively stable. No active cardiac symptoms. He has his usual dyspnea & is on continuous O2 (ususaly) - SaO2 dropped to 75% walking in to the clinic room from check-in.  Improved to 90% sitting in room. He indicates that he has no more SOB  than usual - but has occasional short spells of exacerbation of dyspnea - especially if he were to do anything rapidly. -- can also feel quite dizzy.  He also notes evidence of pretty significant varicose veins in that leg compared to the left leg which was where his veins removed for his CABG. No PND or orthopnea.  Mild occasional palpitations - better. He denies having any anginal CP at rest or with exertion.  No syncope/near syncope. No TIA/amaurosis fugax symptoms. No claudication.  ROS: A comprehensive was performed. Review of Systems  Constitutional: Negative for malaise/fatigue (Tires out quickly.).  HENT: Negative for nosebleeds.   Respiratory: Positive for cough, shortness of breath and wheezing.        Basic chronic COPD related dyspnea. No change /difference  Cardiovascular: Positive for leg swelling (stable). Negative for claudication.  Gastrointestinal: Negative for blood in stool, heartburn and melena.  Genitourinary: Negative for hematuria.  Musculoskeletal: Positive for joint pain (Hips and knees mostly).  Skin: Negative.   Neurological: Negative for dizziness and headaches.  Psychiatric/Behavioral: Negative.  Negative for depression and memory loss. The patient is not nervous/anxious and does not have insomnia.   All other systems reviewed and are negative.   Past Medical History:  Diagnosis Date  . AAA (abdominal aortic aneurysm) (HCC)    4.2 X 4.8 cm 04/2013 CT; declined re-eval 02/25/15  . Anxiety   . CAD in  native artery March 2007   Cath for dyspnea on exertion: 75% distal LM, 85% RI, 95% mid-distal Cx, in multiple RCA lesions with 95% distal. --> Referred for CABG; has declined further noninvasive evaluation in the absence of worsening symptoms  . COPD (chronic obstructive pulmonary disease) with emphysema (HCC) 05/23/2007   Hosp 5/29-6/01/12- COPD exacerbation ONOX 12/08/10- desaturated to less than 88% for over an hour, qualifying for home O2 during sleep      . Diverticulosis of colon with hemorrhage 05/24/2013  . Dyslipidemia, goal LDL below 70   . H/O echocardiogram March ; January 2015   a) Normal LV size and cavity appeared normal function. Grade 1 diastolic dysfunction. Limited study.; b) normal LV size and function with EF 60-65%. Aortic sclerosis without stenosis, mildly increased PA pressures of 44mmHg but normal IVC and RA/RV size  . History of non-ST Elevation MI (myocardial infarction) March 2007   Admitted with COPD exacerbation, given by CHF. Had some chest pain with positive troponins. Cath showed multivessel disease, referred for CABG  . History of pneumonia   . Hypertension   . Hypoxia     chronic, on home O2  . Myocardial infarction   . Obesity (BMI 30-39.9)    One year ago weighed 265 pounds, (12/2012) -- now 234 pounds  . PAF (paroxysmal atrial fibrillation) (HCC)    Anticoagulated on warfarin. Stable -- post op  . Pneumonia    hx  . 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 Seasonal allergies   . Shortness of breath dyspnea     Past Surgical History:  Procedure Laterality Date  . ACHILLES TENDON LENGTHENING Right 10/08/2015   Procedure: ACHILLES TENDON LENGTHENING;  Surgeon: Toni ArthursJohn Hewitt, MD;  Location: [facility] OR;  Service: Orthopedics;  Laterality: Right;  . AMPUTATION Right 10/08/2015   Procedure: RIGHT FOOT TRANSMET AMPUTATION;  Surgeon: Toni ArthursJohn Hewitt, MD;  Location: [facility] OR;  Service: Orthopedics;  Laterality: Right;  . CARDIAC CATHETERIZATION  03 28 2007   NORMAL LV FUNCTION/ ABDOMINAL AORTA STENOSIS,75%-85%. RIGHT FEMORAL ARTERY :CATHETERS USED A  4-FRENCH WITH A 4-FRENCH SHEATH  . CATARACT EXTRACTION     Laser  . COLONOSCOPY N/A 05/24/2013   Procedure: COLONOSCOPY;  Surgeon: Iva Booparl E Gessner, MD;  Location: [facility] ENDOSCOPY;  Service: Endoscopy;  Laterality: N/A;  . CORONARY ARTERY BYPASS GRAFT    . RLL resection for Hamartoma     lungs  . RUL for hamartoma     lung   Current Meds  Medication Sig  .  amLODipine (NORVASC) 10 MG tablet Take 1 tablet (10 mg total) by mouth daily.  Marland Kitchen. arformoterol (BROVANA) 15 MCG/2ML NEBU Take 2 mLs (15 mcg total) by nebulization 2 (two) times daily.  Marland Kitchen. aspirin EC 81 MG tablet Take 81 mg by mouth every morning.   . budesonide-formoterol (SYMBICORT) 160-4.5 MCG/ACT inhaler Inhale 2 puffs into the lungs 2 (two) times daily.  . cholecalciferol (VITAMIN D) 1000 UNITS tablet Take 1,000 Units by mouth every morning.   . furosemide (LASIX) 40 MG tablet TAKE ONE TABLET BY MOUTH EVERY MORNING (Patient taking differently: TAKE 40  MG BY MOUTH EVERY MORNING)  . ipratropium-albuterol (DUONEB) 0.5-2.5 (3) MG/3ML SOLN Take 3 mLs by nebulization every 6 (six) hours as needed (wheezing and shortness of breath).   . OXYGEN Inhale 2 L into the lungs as needed (shortness of breath).   . potassium chloride SA (K-DUR,KLOR-CON) 20 MEQ tablet Take 20 mEq by  mouth daily.  . simvastatin (ZOCOR) 40 MG tablet Take 20 mg by mouth every morning.   . warfarin (COUMADIN) 2.5 MG tablet Take 1-2 tablets by mouth daily as directed by coumadin clinic (Patient taking differently: Take 2.5-5 mg by mouth daily. Take 5 mg on MWF& SAT and take 2.5 mg on all other days)    Allergies  Allergen Reactions  . Prednisone Other (See Comments)    Makes pt jittery and nervous    Social History   Social History  . Marital status: Married    Spouse name: N/A  . Number of children: 2  . Years of education: N/A   Occupational History  . Retail Sales    Social History Main Topics  . Smoking status: Former Smoker    Packs/day: 2.00    Years: 55.00    Types: Cigarettes    Quit date: 05/23/1998  . Smokeless tobacco: Never Used  . Alcohol use No  . Drug use: No  . Sexual activity: Not Asked   Other Topics Concern  . None   Social History Narrative   He is a married father of 2, grandfather of 25, does not get routine exercise. He is a former smoker, but does not currently or not drink alcohol.     He is modifying his diet and trying to get activity but is doing better with the diet than the activity.    He is under a lot of stress.    He was the caregiver for his wife, who was in poor health. She died 11-10-15.   Family History  Problem Relation Age of Onset  . Heart attack Mother   . Heart attack Father      Wt Readings from Last 3 Encounters:  07/26/16 104.8 kg (231 lb)  06/02/16 99.6 kg (219 lb 9.6 oz)  05/22/16 100.5 kg (221 lb 9 oz)  -- evidently trying to lose weight. Is doing more exercise and trying to adjust his diet to  PHYSICAL EXAM BP 130/76   Pulse 81   Ht 6' (1.829 m)   Wt 104.8 kg (231 lb)   BMI 31.33 kg/m   General appearance: alert, cooperative, appears stated age, no distress and Sits in Tripod position with loud expiratory wheezes - but no distress.  pleasant mood & affect Neck: no adenopathy, no carotid bruit, no JVD, supple, symmetrical, trachea midline, thyroid not enlarged, symmetric, no tenderness/mass/nodules and unable to truly assess JVP due to short neck Lungs: barrel chest, prolonged Exp phase. diffuse interstitial sounds & upper airway "wheezing" & rhonchi.  No Rales Heart: RRR, S1 & S2 normal.  + S4. No R/M. Distant heart sounds. Unable to palpate PMI Abdomen: soft, non-tender; bowel sounds normal; no masses,  no organomegaly Extremities: varicose veins noted, venous stasis dermatitis noted and minimal edema Pulses: 2+ and symmetric upper Ext - weak pulses bilateral fee.  Neurologic: Mental status: Alert, oriented, thought content appropriate    Adult ECG Report n/a  Other studies Reviewed: Additional studies/ records that were reviewed today include:  Recent Labs:  Labs not available    ASSESSMENT / PLAN: Problem List Items Addressed This Visit    CAD in native artery - LM, RCA, RI & Cx disease --> CABG x 4 (LIMA-LAD, SVG-OM/RI, SVG-rPDA-rPL) - Primary (Chronic)    No active anginal symptoms - just chronic dyspnea from COPD.  No  CHF symptoms.   Not on Beta Blocker 2/2 COPD - is on Calcium Channel Blocker. ASA &  Simvastatin      Dyslipidemia, goal LDL below 70 (Chronic)    Labs not available - checked by PCP. On simvastatin -- again, would consider converting to atorvastatin or rosuvastatin (esp. Due to interaction with Calcium Channel Blockers).      Essential hypertension (Chronic)    Well controlled on Amlodipine & standing dose of Lasix.      Obesity (BMI 30-39.9) (Chronic)    He weighed as much as 240 in Jan 2014 - now ~231.  Watching diet b/c cannot really do much exercise.      Paroxysmal atrial fibrillation (HCC); CHA2DS2-VASc Score = 4. On Warfarin (Chronic)    On exam, seems to be in NSR.  No rate or rhythm control required (no BB b/c COPD). If rate becomes an issue, can convert CCB to Diltiazem.  On Warfarin - no bleeding.      S/P CABG x 4 (Chronic)    Per his request - not pursuing screening Stress Tests post-CABG unless he has angina.         Current medicines are reviewed at length with the patient today. (+/- concerns) none The following changes have been made: no medication changes Patient Instructions  NO CHANGES WITH CURRENT TREATMENT     Your physician wants you to follow-up in 6 MONTHS WITH Dr Herbie Baltimore.You will receive a reminder letter in the mail two months in advance. If you don't receive a letter, please call our office to schedule the follow-up appointment.    If you need a refill on your cardiac medications before your next appointment, please call your pharmacy.    Studies Ordered:   No orders of the defined types were placed in this encounter.     Bryan Lemma, M.D., M.S. Interventional Cardiologist   Pager # [phone]

## 2016-07-28 ENCOUNTER — Encounter: Payer: Self-pay | Admitting: Cardiology

## 2016-07-28 NOTE — Assessment & Plan Note (Signed)
Per his request - not pursuing screening Stress Tests post-CABG unless he has angina.

## 2016-07-28 NOTE — Assessment & Plan Note (Signed)
Well controlled on Amlodipine & standing dose of Lasix.

## 2016-07-28 NOTE — Assessment & Plan Note (Signed)
Labs not available - checked by PCP. On simvastatin -- again, would consider converting to atorvastatin or rosuvastatin (esp. Due to interaction with Calcium Channel Blockers).

## 2016-07-28 NOTE — Assessment & Plan Note (Signed)
On exam, seems to be in NSR.  No rate or rhythm control required (no BB b/c COPD). If rate becomes an issue, can convert CCB to Diltiazem.  On Warfarin - no bleeding.

## 2016-07-28 NOTE — Assessment & Plan Note (Signed)
He weighed as much as 240 in Jan 2014 - now ~231.  Watching diet b/c cannot really do much exercise.

## 2016-07-28 NOTE — Assessment & Plan Note (Signed)
No active anginal symptoms - just chronic dyspnea from COPD.  No CHF symptoms.   Not on Beta Blocker 2/2 COPD - is on Calcium Channel Blocker. ASA & Simvastatin

## 2016-08-19 ENCOUNTER — Ambulatory Visit (INDEPENDENT_AMBULATORY_CARE_PROVIDER_SITE_OTHER): Payer: Medicare Other | Admitting: Pharmacist Clinician (PhC)/ Clinical Pharmacy Specialist

## 2016-08-19 DIAGNOSIS — I4891 Unspecified atrial fibrillation: Secondary | ICD-10-CM

## 2016-08-19 DIAGNOSIS — I2510 Atherosclerotic heart disease of native coronary artery without angina pectoris: Secondary | ICD-10-CM

## 2016-08-19 DIAGNOSIS — Z7901 Long term (current) use of anticoagulants: Secondary | ICD-10-CM

## 2016-08-19 LAB — POCT INR: INR: 1.6

## 2016-08-29 ENCOUNTER — Telehealth: Payer: Self-pay | Admitting: Internal Medicine

## 2016-08-29 NOTE — Telephone Encounter (Signed)
Attempted to call the pt but the line is busy. Will try back.  

## 2016-08-30 MED ORDER — BUDESONIDE-FORMOTEROL FUMARATE 160-4.5 MCG/ACT IN AERO: 2.0000 | INHALATION_SPRAY | Freq: Two times a day (BID) | RESPIRATORY_TRACT | 0 refills | Status: DC

## 2016-08-30 NOTE — Telephone Encounter (Signed)
Spoke with pt and made him aware of CY's recommendation. Pt is aware that his samples are waiting up front for him to pick up. Nothing further is needed.    [id number]

## 2016-08-30 NOTE — Telephone Encounter (Signed)
Called and spoke with pt and he stated that he is now using the symbicort BID and this is getting very expensive for him and he is requesting a couple of samples.    Pt stated that he has tried to get pt assistance on this medication and a couple of other meds, but was denied. CY please advise. Thanks Allergies  Allergen Reactions  . Prednisone Other (See Comments)    Makes pt jittery and nervous

## 2016-08-30 NOTE — Telephone Encounter (Signed)
Ok 2 samples Symbicort 160 if available

## 2016-09-14 ENCOUNTER — Ambulatory Visit (INDEPENDENT_AMBULATORY_CARE_PROVIDER_SITE_OTHER): Payer: Medicare Other | Admitting: Pharmacist

## 2016-09-14 DIAGNOSIS — I4891 Unspecified atrial fibrillation: Secondary | ICD-10-CM

## 2016-09-14 DIAGNOSIS — I2510 Atherosclerotic heart disease of native coronary artery without angina pectoris: Secondary | ICD-10-CM

## 2016-09-14 DIAGNOSIS — Z7901 Long term (current) use of anticoagulants: Secondary | ICD-10-CM

## 2016-09-14 LAB — POCT INR: INR: 2.3

## 2016-09-26 ENCOUNTER — Observation Stay (HOSPITAL_COMMUNITY)
Admission: EM | Admit: 2016-09-26 | Discharge: 2016-09-27 | Disposition: A | Discharge status: Home / Self Care | Payer: Medicare Other | Attending: Family Medicine | Admitting: Family Medicine

## 2016-09-26 ENCOUNTER — Encounter (HOSPITAL_COMMUNITY): Payer: Self-pay | Admitting: Emergency Medicine

## 2016-09-26 ENCOUNTER — Emergency Department (HOSPITAL_COMMUNITY): Payer: Medicare Other

## 2016-09-26 DIAGNOSIS — Z87891 Personal history of nicotine dependence: Secondary | ICD-10-CM | POA: Diagnosis not present

## 2016-09-26 DIAGNOSIS — I130 Hypertensive heart and chronic kidney disease with heart failure and stage 1 through stage 4 chronic kidney disease, or unspecified chronic kidney disease: Secondary | ICD-10-CM | POA: Insufficient documentation

## 2016-09-26 DIAGNOSIS — E785 Hyperlipidemia, unspecified: Secondary | ICD-10-CM | POA: Insufficient documentation

## 2016-09-26 DIAGNOSIS — J9601 Acute respiratory failure with hypoxia: Secondary | ICD-10-CM

## 2016-09-26 DIAGNOSIS — I252 Old myocardial infarction: Secondary | ICD-10-CM | POA: Insufficient documentation

## 2016-09-26 DIAGNOSIS — Z79899 Other long term (current) drug therapy: Secondary | ICD-10-CM | POA: Diagnosis not present

## 2016-09-26 DIAGNOSIS — Z7982 Long term (current) use of aspirin: Secondary | ICD-10-CM | POA: Insufficient documentation

## 2016-09-26 DIAGNOSIS — Z6829 Body mass index (BMI) 29.0-29.9, adult: Secondary | ICD-10-CM | POA: Insufficient documentation

## 2016-09-26 DIAGNOSIS — Z951 Presence of aortocoronary bypass graft: Secondary | ICD-10-CM | POA: Diagnosis not present

## 2016-09-26 DIAGNOSIS — I110 Hypertensive heart disease with heart failure: Secondary | ICD-10-CM | POA: Diagnosis not present

## 2016-09-26 DIAGNOSIS — Z8701 Personal history of pneumonia (recurrent): Secondary | ICD-10-CM | POA: Diagnosis not present

## 2016-09-26 DIAGNOSIS — Z9111 Patient's noncompliance with dietary regimen: Secondary | ICD-10-CM | POA: Diagnosis not present

## 2016-09-26 DIAGNOSIS — Z66 Do not resuscitate: Secondary | ICD-10-CM | POA: Diagnosis not present

## 2016-09-26 DIAGNOSIS — Z7901 Long term (current) use of anticoagulants: Secondary | ICD-10-CM | POA: Diagnosis not present

## 2016-09-26 DIAGNOSIS — Z9981 Dependence on supplemental oxygen: Secondary | ICD-10-CM | POA: Insufficient documentation

## 2016-09-26 DIAGNOSIS — Z89431 Acquired absence of right foot: Secondary | ICD-10-CM | POA: Diagnosis not present

## 2016-09-26 DIAGNOSIS — J9621 Acute and chronic respiratory failure with hypoxia: Secondary | ICD-10-CM | POA: Diagnosis not present

## 2016-09-26 DIAGNOSIS — E669 Obesity, unspecified: Secondary | ICD-10-CM | POA: Diagnosis not present

## 2016-09-26 DIAGNOSIS — I5033 Acute on chronic diastolic (congestive) heart failure: Secondary | ICD-10-CM | POA: Diagnosis not present

## 2016-09-26 DIAGNOSIS — Z902 Acquired absence of lung [part of]: Secondary | ICD-10-CM | POA: Diagnosis not present

## 2016-09-26 DIAGNOSIS — I2510 Atherosclerotic heart disease of native coronary artery without angina pectoris: Secondary | ICD-10-CM | POA: Diagnosis not present

## 2016-09-26 DIAGNOSIS — J441 Chronic obstructive pulmonary disease with (acute) exacerbation: Secondary | ICD-10-CM | POA: Diagnosis not present

## 2016-09-26 DIAGNOSIS — R069 Unspecified abnormalities of breathing: Secondary | ICD-10-CM | POA: Diagnosis not present

## 2016-09-26 DIAGNOSIS — I480 Paroxysmal atrial fibrillation: Secondary | ICD-10-CM | POA: Diagnosis not present

## 2016-09-26 DIAGNOSIS — J9690 Respiratory failure, unspecified, unspecified whether with hypoxia or hypercapnia: Secondary | ICD-10-CM | POA: Diagnosis present

## 2016-09-26 DIAGNOSIS — I5043 Acute on chronic combined systolic (congestive) and diastolic (congestive) heart failure: Secondary | ICD-10-CM | POA: Diagnosis not present

## 2016-09-26 DIAGNOSIS — N183 Chronic kidney disease, stage 3 (moderate): Secondary | ICD-10-CM | POA: Insufficient documentation

## 2016-09-26 DIAGNOSIS — J9622 Acute and chronic respiratory failure with hypercapnia: Secondary | ICD-10-CM

## 2016-09-26 DIAGNOSIS — R0602 Shortness of breath: Secondary | ICD-10-CM | POA: Diagnosis not present

## 2016-09-26 LAB — URINALYSIS, ROUTINE W REFLEX MICROSCOPIC
Bilirubin Urine: NEGATIVE
GLUCOSE, UA: NEGATIVE mg/dL
Hgb urine dipstick: NEGATIVE
Ketones, ur: NEGATIVE mg/dL
LEUKOCYTES UA: NEGATIVE
Nitrite: NEGATIVE
PROTEIN: NEGATIVE mg/dL
SPECIFIC GRAVITY, URINE: 1.01 (ref 1.005–1.030)
pH: 5 (ref 5.0–8.0)

## 2016-09-26 LAB — CBC WITH DIFFERENTIAL/PLATELET
BASOS ABS: 0 10*3/uL (ref 0.0–0.1)
Basophils Relative: 0 %
EOS PCT: 2 %
Eosinophils Absolute: 0.1 10*3/uL (ref 0.0–0.7)
HCT: 46 % (ref 39.0–52.0)
HEMOGLOBIN: 14.3 g/dL (ref 13.0–17.0)
LYMPHS ABS: 0.8 10*3/uL (ref 0.7–4.0)
LYMPHS PCT: 9 %
MCH: 26.8 pg (ref 26.0–34.0)
MCHC: 31.1 g/dL (ref 30.0–36.0)
MCV: 86.1 fL (ref 78.0–100.0)
Monocytes Absolute: 0.4 10*3/uL (ref 0.1–1.0)
Monocytes Relative: 5 %
NEUTROS ABS: 6.8 10*3/uL (ref 1.7–7.7)
NEUTROS PCT: 84 %
PLATELETS: 215 10*3/uL (ref 150–400)
RBC: 5.34 MIL/uL (ref 4.22–5.81)
RDW: 14.8 % (ref 11.5–15.5)
WBC: 8.1 10*3/uL (ref 4.0–10.5)

## 2016-09-26 LAB — COMPREHENSIVE METABOLIC PANEL
ALT: 12 U/L — ABNORMAL LOW (ref 17–63)
ANION GAP: 9 (ref 5–15)
AST: 16 U/L (ref 15–41)
Albumin: 4.4 g/dL (ref 3.5–5.0)
Alkaline Phosphatase: 68 U/L (ref 38–126)
BILIRUBIN TOTAL: 0.7 mg/dL (ref 0.3–1.2)
BUN: 24 mg/dL — AB (ref 6–20)
CHLORIDE: 98 mmol/L — AB (ref 101–111)
CO2: 33 mmol/L — ABNORMAL HIGH (ref 22–32)
Calcium: 9 mg/dL (ref 8.9–10.3)
Creatinine, Ser: 1.1 mg/dL (ref 0.61–1.24)
Glucose, Bld: 138 mg/dL — ABNORMAL HIGH (ref 65–99)
POTASSIUM: 4.4 mmol/L (ref 3.5–5.1)
Sodium: 140 mmol/L (ref 135–145)
TOTAL PROTEIN: 7.5 g/dL (ref 6.5–8.1)

## 2016-09-26 LAB — TROPONIN I

## 2016-09-26 LAB — INFLUENZA PANEL BY PCR (TYPE A & B)
INFLAPCR: NEGATIVE
Influenza B By PCR: NEGATIVE

## 2016-09-26 LAB — PROTIME-INR
INR: 2.86
PROTHROMBIN TIME: 30.6 s — AB (ref 11.4–15.2)

## 2016-09-26 LAB — I-STAT CG4 LACTIC ACID, ED: LACTIC ACID, VENOUS: 1.02 mmol/L (ref 0.5–1.9)

## 2016-09-26 LAB — BRAIN NATRIURETIC PEPTIDE: B NATRIURETIC PEPTIDE 5: 440.5 pg/mL — AB (ref 0.0–100.0)

## 2016-09-26 MED ORDER — METHYLPREDNISOLONE SODIUM SUCC 40 MG IJ SOLR
40.0000 mg | Freq: Two times a day (BID) | INTRAMUSCULAR | Status: DC
  Administered 2016-09-26 – 2016-09-27 (×3): 40 mg via INTRAVENOUS
  Filled 2016-09-26 (×3): qty 1

## 2016-09-26 MED ORDER — SIMVASTATIN 20 MG PO TABS
20.0000 mg | ORAL_TABLET | Freq: Every day | ORAL | Status: DC
  Administered 2016-09-27: 20 mg via ORAL
  Filled 2016-09-26: qty 1

## 2016-09-26 MED ORDER — IPRATROPIUM BROMIDE 0.02 % IN SOLN
0.5000 mg | Freq: Four times a day (QID) | RESPIRATORY_TRACT | Status: DC
  Administered 2016-09-26 – 2016-09-27 (×5): 0.5 mg via RESPIRATORY_TRACT
  Filled 2016-09-26 (×5): qty 2.5

## 2016-09-26 MED ORDER — FUROSEMIDE 10 MG/ML IJ SOLN
40.0000 mg | Freq: Two times a day (BID) | INTRAMUSCULAR | Status: DC
  Administered 2016-09-27: 40 mg via INTRAVENOUS
  Filled 2016-09-26: qty 4

## 2016-09-26 MED ORDER — WARFARIN - PHARMACIST DOSING INPATIENT: Freq: Every day | Status: DC

## 2016-09-26 MED ORDER — ASPIRIN EC 81 MG PO TBEC
81.0000 mg | DELAYED_RELEASE_TABLET | Freq: Every day | ORAL | Status: DC
  Administered 2016-09-27: 81 mg via ORAL
  Filled 2016-09-26: qty 1

## 2016-09-26 MED ORDER — SODIUM CHLORIDE 0.9% FLUSH
3.0000 mL | Freq: Two times a day (BID) | INTRAVENOUS | Status: DC
  Administered 2016-09-26 – 2016-09-27 (×3): 3 mL via INTRAVENOUS

## 2016-09-26 MED ORDER — WARFARIN SODIUM 2.5 MG PO TABS
2.5000 mg | ORAL_TABLET | Freq: Once | ORAL | Status: AC
  Administered 2016-09-26: 2.5 mg via ORAL
  Filled 2016-09-26: qty 1

## 2016-09-26 MED ORDER — DOXYCYCLINE HYCLATE 100 MG IV SOLR
100.0000 mg | Freq: Two times a day (BID) | INTRAVENOUS | Status: DC
  Administered 2016-09-26 – 2016-09-27 (×2): 100 mg via INTRAVENOUS
  Filled 2016-09-26 (×2): qty 100

## 2016-09-26 MED ORDER — BUDESONIDE 0.25 MG/2ML IN SUSP
0.2500 mg | Freq: Two times a day (BID) | RESPIRATORY_TRACT | Status: DC
  Administered 2016-09-26 – 2016-09-27 (×3): 0.25 mg via RESPIRATORY_TRACT
  Filled 2016-09-26 (×3): qty 2

## 2016-09-26 MED ORDER — FUROSEMIDE 10 MG/ML IJ SOLN
40.0000 mg | Freq: Once | INTRAMUSCULAR | Status: AC
  Administered 2016-09-26: 40 mg via INTRAVENOUS
  Filled 2016-09-26: qty 4

## 2016-09-26 MED ORDER — AMLODIPINE BESYLATE 10 MG PO TABS
10.0000 mg | ORAL_TABLET | Freq: Every day | ORAL | Status: DC
  Administered 2016-09-27: 10 mg via ORAL
  Filled 2016-09-26: qty 1

## 2016-09-26 MED ORDER — LEVALBUTEROL HCL 0.63 MG/3ML IN NEBU
0.6300 mg | INHALATION_SOLUTION | Freq: Four times a day (QID) | RESPIRATORY_TRACT | Status: DC
  Administered 2016-09-26 – 2016-09-27 (×5): 0.63 mg via RESPIRATORY_TRACT
  Filled 2016-09-26 (×6): qty 3

## 2016-09-26 NOTE — H&P (Signed)
History and Physical    Roberto ChristmasJames Ewell ZOX:[id number] DOB: 02/10/1934 DOA: 09/26/2016  I have briefly reviewed the patient's [facility]  PCP: Pearson GrippeKim, [place], MD  Patient coming from: Home  Chief Complaint: Shortness of breath  HPI: Roberto Reilly is a 83 y.o. male with medical history significant of coronary artery disease status post CABG, COPD with history of lobectomy in 2000, diastolic CHF, prior tobacco abuse and alcohol abuse, hypertension, hyperlipidemia, paroxysmal A. fib on chronic anticoagulation, presents to the emergency room with chief complaint of shortness of breath.  Patient tells me that he has chronic dyspnea and he is on oxygen for a long time, however the last week he has felt like he has been getting more and more short of breath with minimal activities.  Right now he is able to walk 10 may be 15 feet and that he needs to stop and catch his breath.  He denies any chest pain or palpitations.  He denies any fever or chills.  He also states that over the last week he has been having a cough which is productive of green sputum.  He has no abdominal pain, no nausea vomiting or diarrhea.  He also complains of a mild sore throat over the last few days.  He denies any swelling in his ankles or weight gain (however does not weigh himself daily).  He does not watch the salt in his diet, and does not think much about this being an issue.  He reports compliance with all his home medications.  ED Course: In the ED, his vital signs are stable, he is afebrile, he is tachypneic, his heart rate is in the 60s and 70s and his blood pressure is within normal limits.  He is satting in the low 90s on 4 L nasal cannula.  Blood work shows normal renal function, his BNP is elevated to 440.  His lactic acid is 1.0.  Chest x-ray concerning for mild CHF.  TRH was asked for admission for CHF exacerbation.  Review of Systems: As per HPI otherwise 10 point review of systems negative.    Past Medical History:  Diagnosis Date  . AAA (abdominal aortic aneurysm) (HCC)    4.2 X 4.8 cm 04/2013 CT; declined re-eval 02/25/15  . Anxiety   . CAD in native artery March 2007   Cath for dyspnea on exertion: 75% distal LM, 85% RI, 95% mid-distal Cx, in multiple RCA lesions with 95% distal. --> Referred for CABG; has declined further noninvasive evaluation in the absence of worsening symptoms  . COPD (chronic obstructive pulmonary disease) with emphysema (HCC) 05/23/2007   Hosp 5/29-6/01/12- COPD exacerbation ONOX 12/08/10- desaturated to less than 88% for over an hour, qualifying for home O2 during sleep    . Diverticulosis of colon with hemorrhage 05/24/2013  . Dyslipidemia, goal LDL below 70   . H/O echocardiogram March ; January 2015   a) Normal LV size and cavity appeared normal function. Grade 1 diastolic dysfunction. Limited study.; b) normal LV size and function with EF 60-65%. Aortic sclerosis without stenosis, mildly increased PA pressures of 44mmHg but normal IVC and RA/RV size  . History of non-ST Elevation MI (myocardial infarction) March 2007   Admitted with COPD exacerbation, given by CHF. Had some chest pain with positive troponins. Cath showed multivessel disease, referred for CABG  . History of pneumonia   . Hypertension   . Hypoxia     chronic, on home O2  . Myocardial  infarction (HCC)   . Obesity (BMI 30-39.9)    One year ago weighed 265 pounds, (12/2012) -- now 234 pounds  . PAF (paroxysmal atrial fibrillation) (HCC)    Anticoagulated on warfarin. Stable -- post op  . Pneumonia    hx  . 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 Seasonal allergies   . Shortness of breath dyspnea     Past Surgical History:  Procedure Laterality Date  . ACHILLES TENDON LENGTHENING Right 10/08/2015   Procedure: ACHILLES TENDON LENGTHENING;  Surgeon: Toni Arthurs, MD;  Location: [facility] OR;  Service: Orthopedics;  Laterality: Right;  . AMPUTATION Right 10/08/2015    Procedure: RIGHT FOOT TRANSMET AMPUTATION;  Surgeon: Toni Arthurs, MD;  Location: [facility] OR;  Service: Orthopedics;  Laterality: Right;  . CARDIAC CATHETERIZATION  03 28 2007   NORMAL LV FUNCTION/ ABDOMINAL AORTA STENOSIS,75%-85%. RIGHT FEMORAL ARTERY :CATHETERS USED A  4-FRENCH WITH A 4-FRENCH SHEATH  . CATARACT EXTRACTION     Laser  . COLONOSCOPY N/A 05/24/2013   Procedure: COLONOSCOPY;  Surgeon: Iva Boop, MD;  Location: [facility] ENDOSCOPY;  Service: Endoscopy;  Laterality: N/A;  . CORONARY ARTERY BYPASS GRAFT    . RLL resection for Hamartoma     lungs  . RUL for hamartoma     lung     reports that he quit smoking about 18 years ago. His smoking use included Cigarettes. He has a 110.00 pack-year smoking history. He has never used smokeless tobacco. He reports that he does not drink alcohol or use drugs.  Allergies  Allergen Reactions  . Prednisone Other (See Comments)    Makes pt jittery and nervous    Family History  Problem Relation Age of Onset  . Heart attack Mother   . Heart attack Father     Prior to Admission medications   Medication Sig Start Date End Date Taking? Authorizing Provider  amLODipine (NORVASC) 10 MG tablet Take 1 tablet (10 mg total) by mouth daily. 05/01/13  Yes Dorothea Ogle, MD  arformoterol (BROVANA) 15 MCG/2ML NEBU Take 2 mLs (15 mcg total) by nebulization 2 (two) times daily. 05/22/16  Yes Vassie Loll, MD  aspirin EC 81 MG tablet Take 81 mg by mouth every morning.    Yes [provider]  cholecalciferol (VITAMIN D) 1000 UNITS tablet Take 1,000 Units by mouth every morning.    Yes [provider]  ipratropium-albuterol (DUONEB) 0.5-2.5 (3) MG/3ML SOLN Take 3 mLs by nebulization every 6 (six) hours as needed (wheezing and shortness of breath).    Yes [provider]  OXYGEN Inhale 2 L into the lungs as needed (shortness of breath).    Yes [provider]  potassium chloride SA (K-DUR,KLOR-CON) 20 MEQ tablet Take 20 mEq by  mouth daily. 03/28/16  Yes [provider]  simvastatin (ZOCOR) 40 MG tablet Take 20 mg by mouth every morning.    Yes [provider]  warfarin (COUMADIN) 2.5 MG tablet Take 1-2 tablets by mouth daily as directed by coumadin clinic Patient taking differently: Take 2.5-5 mg by mouth daily. Take 5 mg on MWF& SAT and take 2.5 mg on all other days 03/04/16  Yes Marykay Lex, MD  budesonide-formoterol [facility]) 160-4.5 MCG/ACT inhaler Inhale 2 puffs into the lungs 2 (two) times daily. Patient not taking: Reported on 09/26/2016 04/29/16   Elgergawy, Leana Roe, MD  budesonide-formoterol [facility]) 160-4.5 MCG/ACT inhaler Inhale 2 puffs into the lungs 2 (two)  times daily. Patient not taking: Reported on 09/26/2016 08/30/16   Waymon Budge, MD  furosemide (LASIX) 40 MG tablet TAKE ONE TABLET BY MOUTH EVERY MORNING Patient taking differently: TAKE 40  MG BY MOUTH EVERY MORNING 02/08/16   Marykay Lex, MD    Physical Exam: Vitals:   09/26/16 1215 09/26/16 1230 09/26/16 1245 09/26/16 1300  BP:  124/61  120/61  Pulse: 70 75 73 70  Resp:  (!) 28 (!) 29 (!) 22  Temp:      TempSrc:      SpO2: 90% 95% 91% 93%  Weight:      Height:       Constitutional: NAD, coarse breath sounds audible from the door Eyes: PERRL, lids and conjunctivae normal ENMT: Mucous membranes are moist. Posterior pharynx clear of any exudate or lesions.  Neck: normal, supple Respiratory: Coarse breath sounds throughout, wheezing mainly at the bases, decreased breath sounds in the right lower lung field, moves air well Cardiovascular: Irregular, no murmurs / rubs / gallops. 1-2 + lower extremity edema. 2+ pedal pulses.  Abdomen: no tenderness, no masses palpated. Bowel sounds positive.  Musculoskeletal: no clubbing / cyanosis. Normal muscle tone.  Skin: no rashes, lesions, ulcers. No induration Neurologic: CN 2-12 grossly intact. Strength 5/5 in all 4.  Psychiatric: Normal judgment and insight. Alert and  oriented x 3. Normal mood.   Labs on Admission: I have personally reviewed following labs and imaging studies  CBC:  Recent Labs Lab 09/26/16 1141  WBC 8.1  NEUTROABS 6.8  HGB 14.3  HCT 46.0  MCV 86.1  PLT 215   Basic Metabolic Panel:  Recent Labs Lab 09/26/16 1141  NA 140  K 4.4  CL 98*  CO2 33*  GLUCOSE 138*  BUN 24*  CREATININE 1.10  CALCIUM 9.0   GFR: Estimated Creatinine Clearance: 67 mL/min (by C-G formula based on SCr of 1.1 mg/dL). Liver Function Tests:  Recent Labs Lab 09/26/16 1141  AST 16  ALT 12*  ALKPHOS 68  BILITOT 0.7  PROT 7.5  ALBUMIN 4.4   No results for input(s): LIPASE, AMYLASE in the last 168 hours. No results for input(s): AMMONIA in the last 168 hours. Coagulation Profile: No results for input(s): INR, PROTIME in the last 168 hours. Cardiac Enzymes:  Recent Labs Lab 09/26/16 1141  TROPONINI <0.03   BNP (last 3 results) No results for input(s): PROBNP in the last 8760 hours. HbA1C: No results for input(s): HGBA1C in the last 72 hours. CBG: No results for input(s): GLUCAP in the last 168 hours. Lipid Profile: No results for input(s): CHOL, HDL, LDLCALC, TRIG, CHOLHDL, LDLDIRECT in the last 72 hours. Thyroid Function Tests: No results for input(s): TSH, T4TOTAL, FREET4, T3FREE, THYROIDAB in the last 72 hours. Anemia Panel: No results for input(s): VITAMINB12, FOLATE, FERRITIN, TIBC, IRON, RETICCTPCT in the last 72 hours. Urine analysis:    Component Value Date/Time   COLORURINE YELLOW 09/26/2016 1206   APPEARANCEUR CLEAR 09/26/2016 1206   LABSPEC 1.010 09/26/2016 1206   PHURINE 5.0 09/26/2016 1206   GLUCOSEU NEGATIVE 09/26/2016 1206   HGBUR NEGATIVE 09/26/2016 1206   BILIRUBINUR NEGATIVE 09/26/2016 1206   KETONESUR NEGATIVE 09/26/2016 1206   PROTEINUR NEGATIVE 09/26/2016 1206   UROBILINOGEN 0.2 10/19/2010 1150   NITRITE NEGATIVE 09/26/2016 1206   LEUKOCYTESUR NEGATIVE 09/26/2016 1206     Radiological Exams on  Admission: Dg Chest 2 View  Result Date: 09/26/2016 CLINICAL DATA:  New onset worsening shortness of breath EXAM: CHEST  2 VIEW COMPARISON:  05/19/2016 FINDINGS: There is a small right pleural effusion. There is bilateral lower lobe atelectasis. There is bilateral interstitial thickening. There is no pneumothorax. There is cardiomegaly. There is evidence of prior CABG. The osseous structures are unremarkable. IMPRESSION: 1. Five days most concerning for mild CHF. Electronically Signed   By: Elige Ko   On: 09/26/2016 12:02    EKG: Independently reviewed. Afib, RBBB  Assessment/Plan Active Problems:   Chronic anticoagulation   CAD in native artery - LM, RCA, RI & Cx disease --> CABG x 4 (LIMA-LAD, SVG-OM/RI, SVG-rPDA-rPL)   S/P CABG x 4   Paroxysmal atrial fibrillation (HCC); CHA2DS2-VASc Score = 4. On Warfarin   COPD exacerbation (HCC)   Acute on chronic respiratory failure with hypoxia and hypercapnia (HCC)   Acute on chronic hypoxic respiratory failure -Likely multifactorial mainly due to acute on chronic diastolic CHF, could not exclude a component of COPD exacerbation  Acute on chronic diastolic CHF -Patient with dietary noncompliance, he has some evidence of fluid overload on chest x-ray, lower extremity edema as well as mild crackles on exam, he is already received 40 mg of IV Lasix in the ED.  Scheduled 40 mg of Lasix 3 times daily -Monitor strict I's and O's, daily weights, low-sodium diet -most recent 2D echo was done in June 2017 showed an ejection fraction of 55-60%.  COPD exacerbation -Patient with cough with progressive green sputum production, and wheezing on exam -Start IV steroids, nebulizers, empiric doxycycline -Flu screen was negative  Coronary artery disease -No chest pain, this appears stable -Continue aspirin  Paroxysmal A. fib -Currently in A. fib, he is rate controlled on no rate controlling agents.  Continue warfarin per pharmacy  Hypertension -Resume  home Norvasc  Hyperlipidemia -Resume home simvastatin   DVT prophylaxis: Coumadin Code Status: DNR Family Communication: No family at bedside Disposition Plan: Admit to telemetry Consults called: None    Admission status: Observation  At the point of initial evaluation, it is my clinical opinion that admission for OBSERVATION is reasonable and necessary because the patient's presenting complaints in the context of their chronic conditions represent sufficient risk of deterioration or significant morbidity to constitute reasonable grounds for close observation in the hospital setting, but that the patient may be medically stable for discharge from the hospital within 24 to 48 hours.   Pamella Pert, MD [facility] Pager [phone]  If 7PM-7AM, please contact night-coverage [email or web address] Password [email or web address]  09/26/2016, 1:34 PM

## 2016-09-26 NOTE — Care Management Note (Signed)
Case Management Note  Patient Details  Name: Karna ChristmasJames Marcou MRN: [id number] Date of Birth: 06/01/1933  Subjective/Objective: 83 y/o m admitted w/COPD. From home. Active w/[employer] 02-has travel tank.                   Action/Plan:d/c plan home.   Expected Discharge Date:   (unknown)               Expected Discharge Plan:  Home/Self Care  In-House Referral:     Discharge planning Services  CM Consult  Post Acute Care Choice:  Durable Medical Equipment ([employer] 02) Choice offered to:     DME Arranged:    DME Agency:     HH Arranged:    HH Agency:     Status of Service:  In process, will continue to follow  If discussed at Long Length of Stay Meetings, dates discussed:    Additional Comments:  [name], Toran Murch, RN 09/26/2016, 3:34 PM

## 2016-09-26 NOTE — ED Provider Notes (Signed)
[facility]-EMERGENCY DEPT Provider Note   CSN: [id number] Arrival date & time: 09/26/16  1033     History   Chief Complaint Chief Complaint  Patient presents with  . Shortness of Breath    HPI Roberto Reilly is a 83 y.o. male.  HPI 83 y.o.malewith PMH significant for chronic resp failure (usually 2-3L of oxygen at baseline), CKD stage 3, HTN, HLD, CAD S/P CABG, Obesity and PAF (on coumadin chronically); who presented with worsening of SOB and increase wheezing. Pt reports that she has been having dib for the past week. PT has cough with productive phlegm. He has no fevers, nausea, emesis. Pt has CAD hx, but has no chest pain. Pt denies sick contacts. Pt is having dib with exertion.   Past Medical History:  Diagnosis Date  . AAA (abdominal aortic aneurysm) (HCC)    4.2 X 4.8 cm 04/2013 CT; declined re-eval 02/25/15  . Anxiety   . CAD in native artery March 2007   Cath for dyspnea on exertion: 75% distal LM, 85% RI, 95% mid-distal Cx, in multiple RCA lesions with 95% distal. --> Referred for CABG; has declined further noninvasive evaluation in the absence of worsening symptoms  . COPD (chronic obstructive pulmonary disease) with emphysema (HCC) 05/23/2007   Hosp 5/29-6/01/12- COPD exacerbation ONOX 12/08/10- desaturated to less than 88% for over an hour, qualifying for home O2 during sleep    . Diverticulosis of colon with hemorrhage 05/24/2013  . Dyslipidemia, goal LDL below 70   . H/O echocardiogram March ; January 2015   a) Normal LV size and cavity appeared normal function. Grade 1 diastolic dysfunction. Limited study.; b) normal LV size and function with EF 60-65%. Aortic sclerosis without stenosis, mildly increased PA pressures of 44mmHg but normal IVC and RA/RV size  . History of non-ST Elevation MI (myocardial infarction) March 2007   Admitted with COPD exacerbation, given by CHF. Had some chest pain with positive troponins. Cath showed multivessel disease, referred for CABG  .  History of pneumonia   . Hypertension   . Hypoxia     chronic, on home O2  . Myocardial infarction (HCC)   . Obesity (BMI 30-39.9)    One year ago weighed 265 pounds, (12/2012) -- now 234 pounds  . PAF (paroxysmal atrial fibrillation) (HCC)    Anticoagulated on warfarin. Stable -- post op  . Pneumonia    hx  . 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 Seasonal allergies   . Shortness of breath dyspnea     Patient Active Problem List   Diagnosis Date Noted  . Acute bronchitis due to respiratory syncytial virus (RSV) 05/20/2016  . Acute on chronic respiratory failure with hypoxia and hypercapnia (HCC) 05/19/2016  . Shortness of breath 05/18/2016  . Chronic renal failure syndrome, stage 3 (moderate) 05/18/2016  . Hypercapnia 05/18/2016  . Hyperglycemia   . Chronic osteomyelitis of right foot (HCC) 02/05/2016  . Acute and chronic respiratory failure with hypoxia (HCC)   . Acute on chronic respiratory failure (HCC) 11/03/2015  . Abnormal TSH 11/03/2015  . Pleural effusion 11/01/2015  . Acute renal failure (ARF) (HCC) 11/01/2015  . Sepsis (HCC) 11/01/2015  . Pneumonia 11/01/2015  . COPD exacerbation (HCC) 11/01/2015  . Encounter for long-term (current) use of other medications 01/07/2014  . Nasal sinus congestion 01/07/2014  . Diverticulosis of colon with hemorrhage 05/24/2013  . Internal hemorrhoids 05/24/2013  . AAA (abdominal aortic aneurysm) without rupture seen on  CT scan 05/22/2013  . Rectal bleeding 05/21/2013  . Anemia 05/20/2013  . Generalized anxiety disorder 05/09/2013  . Auditory hallucination 05/09/2013  . COPD mixed type (HCC) 05/07/2013  . COPD with acute exacerbation (HCC) 05/07/2013  . Chest discomfort 05/07/2013  . Hx of scabies 04/29/2013  . Essential hypertension 04/29/2013  . Obesity (BMI 30-39.9) 01/18/2013  . Paroxysmal atrial fibrillation (HCC); CHA2DS2-VASc Score = 4. On Warfarin   . Dyslipidemia, goal LDL below 70   . Chronic  anticoagulation 08/09/2012  . Reactive depression (situational) 09/06/2011  . CAD 05/23/2007  . CAD in native artery - LM, RCA, RI & Cx disease --> CABG x 4 (LIMA-LAD, SVG-OM/RI, SVG-rPDA-rPL) 07/21/2005    Class: History of  . S/P CABG x 4 07/21/2005    Past Surgical History:  Procedure Laterality Date  . ACHILLES TENDON LENGTHENING Right 10/08/2015   Procedure: ACHILLES TENDON LENGTHENING;  Surgeon: Toni Arthurs, MD;  Location: [facility] OR;  Service: Orthopedics;  Laterality: Right;  . AMPUTATION Right 10/08/2015   Procedure: RIGHT FOOT TRANSMET AMPUTATION;  Surgeon: Toni Arthurs, MD;  Location: [facility] OR;  Service: Orthopedics;  Laterality: Right;  . CARDIAC CATHETERIZATION  03 28 2007   NORMAL LV FUNCTION/ ABDOMINAL AORTA STENOSIS,75%-85%. RIGHT FEMORAL ARTERY :CATHETERS USED A  4-FRENCH WITH A 4-FRENCH SHEATH  . CATARACT EXTRACTION     Laser  . COLONOSCOPY N/A 05/24/2013   Procedure: COLONOSCOPY;  Surgeon: Iva Boop, MD;  Location: [facility] ENDOSCOPY;  Service: Endoscopy;  Laterality: N/A;  . CORONARY ARTERY BYPASS GRAFT    . RLL resection for Hamartoma     lungs  . RUL for hamartoma     lung       Home Medications    Prior to Admission medications   Medication Sig Start Date End Date Taking? Authorizing Provider  amLODipine (NORVASC) 10 MG tablet Take 1 tablet (10 mg total) by mouth daily. 05/01/13  Yes Dorothea Ogle, MD  arformoterol (BROVANA) 15 MCG/2ML NEBU Take 2 mLs (15 mcg total) by nebulization 2 (two) times daily. 05/22/16  Yes Vassie Loll, MD  aspirin EC 81 MG tablet Take 81 mg by mouth every morning.    Yes [provider]  cholecalciferol (VITAMIN D) 1000 UNITS tablet Take 1,000 Units by mouth every morning.    Yes [provider]  ipratropium-albuterol (DUONEB) 0.5-2.5 (3) MG/3ML SOLN Take 3 mLs by nebulization every 6 (six) hours as needed (wheezing and shortness of breath).    Yes [provider]  OXYGEN Inhale 2 L into the lungs as needed  (shortness of breath).    Yes [provider]  potassium chloride SA (K-DUR,KLOR-CON) 20 MEQ tablet Take 20 mEq by mouth daily. 03/28/16  Yes [provider]  simvastatin (ZOCOR) 40 MG tablet Take 20 mg by mouth every morning.    Yes [provider]  warfarin (COUMADIN) 2.5 MG tablet Take 1-2 tablets by mouth daily as directed by coumadin clinic Patient taking differently: Take 2.5-5 mg by mouth daily. Take 5 mg on MWF& SAT and take 2.5 mg on all other days 03/04/16  Yes Marykay Lex, MD  budesonide-formoterol [facility]) 160-4.5 MCG/ACT inhaler Inhale 2 puffs into the lungs 2 (two) times daily. Patient not taking: Reported on 09/26/2016 04/29/16   Elgergawy, Leana Roe, MD  budesonide-formoterol [facility]) 160-4.5 MCG/ACT inhaler Inhale 2 puffs into the lungs 2 (two) times daily. Patient not taking: Reported on 09/26/2016 08/30/16   Waymon Budge, MD  furosemide (LASIX) 40  MG tablet TAKE ONE TABLET BY MOUTH EVERY MORNING Patient taking differently: TAKE 40  MG BY MOUTH EVERY MORNING 02/08/16   Marykay Lex, MD    Family History Family History  Problem Relation Age of Onset  . Heart attack Mother   . Heart attack Father     Social History Social History  Substance Use Topics  . Smoking status: Former Smoker    Packs/day: 2.00    Years: 55.00    Types: Cigarettes    Quit date: 05/23/1998  . Smokeless tobacco: Never Used  . Alcohol use No     Allergies   Prednisone   Review of Systems Review of Systems  All other systems reviewed and are negative.    Physical Exam Updated Vital Signs BP 124/61   Pulse 73   Temp 98.3 F (36.8 C) (Oral)   Resp (!) 29   Ht 6' (1.829 m)   Wt 231 lb (104.8 kg)   SpO2 91%   BMI 31.33 kg/m   Physical Exam  Constitutional: He is oriented to person, place, and time. He appears well-developed.  HENT:  Head: Normocephalic and atraumatic.  Eyes: Conjunctivae and EOM are normal. Pupils are equal, round, and  reactive to light.  Neck: Normal range of motion. Neck supple. JVD present.  Cardiovascular: Normal rate, regular rhythm and normal heart sounds.   Pulmonary/Chest: Effort normal and breath sounds normal. No respiratory distress. He has no wheezes.  Abdominal: Soft. Bowel sounds are normal. He exhibits no distension. There is no tenderness. There is no rebound and no guarding.  Neurological: He is alert and oriented to person, place, and time.  Skin: Skin is warm.  Nursing note and vitals reviewed.    ED Treatments / Results  Labs (all labs ordered are listed, but only abnormal results are displayed) Labs Reviewed  COMPREHENSIVE METABOLIC PANEL - Abnormal; Notable for the following:       Result Value   Chloride 98 (*)    CO2 33 (*)    Glucose, Bld 138 (*)    BUN 24 (*)    ALT 12 (*)    All other components within normal limits  BRAIN NATRIURETIC PEPTIDE - Abnormal; Notable for the following:    B Natriuretic Peptide 440.5 (*)    All other components within normal limits  CULTURE, BLOOD (ROUTINE X 2)  CULTURE, BLOOD (ROUTINE X 2)  CBC WITH DIFFERENTIAL/PLATELET  URINALYSIS, ROUTINE W REFLEX MICROSCOPIC  INFLUENZA PANEL BY PCR (TYPE A & B)  TROPONIN I  PROTIME-INR  I-STAT CG4 LACTIC ACID, ED    EKG  EKG Interpretation  Date/Time:  Monday Sep 26 2016 10:43:04 EDT Ventricular Rate:  78 PR Interval:    QRS Duration: 144 QT Interval:  392 QTC Calculation: 447 R Axis:   127 Text Interpretation:  Atrial fibrillation RBBB and LPFB No acute changes No significant change since last tracing Confirmed by Derwood Kaplan ([id number]) on 09/26/2016 10:45:46 AM       Radiology Dg Chest 2 View  Result Date: 09/26/2016 CLINICAL DATA:  New onset worsening shortness of breath EXAM: CHEST  2 VIEW COMPARISON:  05/19/2016 FINDINGS: There is a small right pleural effusion. There is bilateral lower lobe atelectasis. There is bilateral interstitial thickening. There is no pneumothorax. There is  cardiomegaly. There is evidence of prior CABG. The osseous structures are unremarkable. IMPRESSION: 1. Five days most concerning for mild CHF. Electronically Signed   By: Elige Ko   On: 09/26/2016  12:02    Procedures Procedures (including critical care time)  CRITICAL CARE Performed by: Derwood Kaplan   Total critical care time: 40 minutes  Critical care time was exclusive of separately billable procedures and treating other patients.  Critical care was necessary to treat or prevent imminent or life-threatening deterioration.  Critical care was time spent personally by me on the following activities: development of treatment plan with patient and/or surrogate as well as nursing, discussions with consultants, evaluation of patient's response to treatment, examination of patient, obtaining history from patient or surrogate, ordering and performing treatments and interventions, ordering and review of laboratory studies, ordering and review of radiographic studies, pulse oximetry and re-evaluation of patient's condition.   Medications Ordered in ED Medications  furosemide (LASIX) injection 40 mg (not administered)     Initial Impression / Assessment and Plan / ED Course  I have reviewed the triage vital signs and the nursing notes.  Pertinent labs & imaging results that were available during my care of the patient were reviewed by me and considered in my medical decision making (see chart for details).  Clinical Course as of Sep 26 1304  Mon Sep 26, 2016  1304 CXR show's more CHF than CAP. WC is normal Lactate is normal. We will admit for acute on chronic hypoxia due to CHF exacerbation. DG Chest 2 View [AN]    Clinical Course User Index [AN] Derwood Kaplan, MD    I personally performed the services described in this documentation, which was scribed in my presence. The recorded information has been reviewed and is accurate.  PT comes in with cc of DIB. Clinical concerns  for acute CHF exacerbation vs. CAP / Flu. He has hx of chronic hypoxia due to COPD -  3 liters - but now needing more O2. Pt has exertional dyspnea and tachypneic. He has mild rales at the base and there is some rhonchi. No wheezing.   Final Clinical Impressions(s) / ED Diagnoses   Final diagnoses:  Acute on chronic combined systolic and diastolic congestive heart failure (HCC)  Acute respiratory failure with hypoxia [facility])    New Prescriptions New Prescriptions   No medications on file     Derwood Kaplan, MD 09/26/16 1306

## 2016-09-26 NOTE — ED Notes (Signed)
Bed: [id number] Expected date:  Expected time:  Means of arrival:  Comments: EMS 83 yo, SOB

## 2016-09-26 NOTE — ED Notes (Signed)
Patient transported to X-ray 

## 2016-09-26 NOTE — Care Management Obs Status (Signed)
MEDICARE OBSERVATION STATUS NOTIFICATION   Patient Details  Name: Roberto Reilly MRN: [id number] Date of Birth: 07/07/1933   Medicare Observation Status Notification Given:  Yes    MahabirOlegario Messier, Dmiyah Liscano, RN 09/26/2016, 3:34 PM

## 2016-09-26 NOTE — Progress Notes (Signed)
ANTICOAGULATION CONSULT NOTE - Follow Up Consult  Pharmacy Consult for warfarin Indication: hx atrial fibrillation  Allergies  Allergen Reactions  . Prednisone Other (See Comments)    Makes pt jittery and nervous    Patient Measurements: Height: 6' (182.9 cm) Weight: 231 lb (104.8 kg) IBW/kg (Calculated) : 77.6 Heparin Dosing Weight:   Vital Signs: Temp: 98.2 F (36.8 C) (05/07 1431) Temp Source: Oral (05/07 1431) BP: 135/57 (05/07 1431) Pulse Rate: 95 (05/07 1431)  Labs:  Recent Labs  09/26/16 1141 09/26/16 1147  HGB 14.3  --   HCT 46.0  --   PLT 215  --   LABPROT  --  30.6*  INR  --  2.86  CREATININE 1.10  --   TROPONINI <0.03  --     Estimated Creatinine Clearance: 67 mL/min (by C-G formula based on SCr of 1.1 mg/dL).   Medications:  home warfarin dose: 2.5 mg daily except 5 mg on MWF (on days when he takes 5 mg -- he's been taking 2.5 mg in AM and 2.5 mg in the evening. Informed pt that he can take both doses at the same time). He took 2.5 mg in the morning on 5/7 prior to coming to the hospital   Assessment: Patient is an 83 y.o M with hx COPD and afib on warfarin PTA, presented to the ED on 09/26/16 with c/o SOB.  To resume warfarin while inpt.  Today, 09/26/2016: - INR is therapeutic at 2.86 - cbc stable - no bleeding documented - drug-drug intxns: doxycycline (abx can make pt more sensitive to warfarin)  Goal of Therapy:  INR 2-3 Monitor platelets by anticoagulation protocol: Yes   Plan:  - warfarin 2.5 mg PO x1 at 1800 today to get 5 mg total (home dose) - daily INR - monitor for s/s bleeding  Roberto Reilly P 09/26/2016,2:37 PM

## 2016-09-26 NOTE — ED Notes (Signed)
Attempted 2nd IV insertion without success.

## 2016-09-26 NOTE — Progress Notes (Signed)
Respiratory care assessment performed on Pt.  Pt severity score is 2 and very close to being a 3.  Based on this and how Pt sounds, RT will leave Pt with q6h breathing treatments at this time.

## 2016-09-26 NOTE — ED Triage Notes (Signed)
Per EMS, patient reports shortness of breath starting this morning. Per EMS, patient was at 50% on 3 L (which is what patient normally wears at home). Patient given 10 mg of albuterol and 1 Atrovent en route with EMS. Patient is now 97% on neb treatment. Patient given 125 solumedrol IV en route. Patient is from home.

## 2016-09-26 NOTE — ED Notes (Signed)
Pt given urinal and encouraged to void when able. 

## 2016-09-27 DIAGNOSIS — I480 Paroxysmal atrial fibrillation: Secondary | ICD-10-CM | POA: Diagnosis not present

## 2016-09-27 DIAGNOSIS — I5033 Acute on chronic diastolic (congestive) heart failure: Secondary | ICD-10-CM | POA: Diagnosis not present

## 2016-09-27 DIAGNOSIS — J441 Chronic obstructive pulmonary disease with (acute) exacerbation: Secondary | ICD-10-CM | POA: Diagnosis not present

## 2016-09-27 DIAGNOSIS — J9622 Acute and chronic respiratory failure with hypercapnia: Secondary | ICD-10-CM | POA: Diagnosis not present

## 2016-09-27 DIAGNOSIS — J9621 Acute and chronic respiratory failure with hypoxia: Secondary | ICD-10-CM | POA: Diagnosis not present

## 2016-09-27 DIAGNOSIS — I5043 Acute on chronic combined systolic (congestive) and diastolic (congestive) heart failure: Secondary | ICD-10-CM

## 2016-09-27 LAB — BASIC METABOLIC PANEL
ANION GAP: 14 (ref 5–15)
BUN: 30 mg/dL — ABNORMAL HIGH (ref 6–20)
CALCIUM: 9.1 mg/dL (ref 8.9–10.3)
CHLORIDE: 94 mmol/L — AB (ref 101–111)
CO2: 30 mmol/L (ref 22–32)
Creatinine, Ser: 1.17 mg/dL (ref 0.61–1.24)
GFR calc non Af Amer: 57 mL/min — ABNORMAL LOW (ref >60)
Glucose, Bld: 134 mg/dL — ABNORMAL HIGH (ref 65–99)
Potassium: 4.3 mmol/L (ref 3.5–5.1)
Sodium: 138 mmol/L (ref 135–145)

## 2016-09-27 LAB — PROTIME-INR
INR: 3.11
Prothrombin Time: 32.7 seconds — ABNORMAL HIGH (ref 11.4–15.2)

## 2016-09-27 MED ORDER — PREDNISONE 20 MG PO TABS: 40.0000 mg | ORAL_TABLET | Freq: Every day | ORAL | 0 refills | Status: DC

## 2016-09-27 MED ORDER — WARFARIN SODIUM 1 MG PO TABS
1.0000 mg | ORAL_TABLET | Freq: Once | ORAL | Status: DC
  Filled 2016-09-27: qty 1

## 2016-09-27 MED ORDER — IPRATROPIUM BROMIDE 0.02 % IN SOLN: 0.5000 mg | Freq: Three times a day (TID) | RESPIRATORY_TRACT | Status: DC

## 2016-09-27 MED ORDER — AZITHROMYCIN 500 MG PO TABS
500.0000 mg | ORAL_TABLET | Freq: Every day | ORAL | 0 refills | Status: AC
Start: 2016-09-27 — End: 2016-09-29

## 2016-09-27 MED ORDER — LEVALBUTEROL HCL 0.63 MG/3ML IN NEBU: 0.6300 mg | INHALATION_SOLUTION | Freq: Three times a day (TID) | RESPIRATORY_TRACT | Status: DC

## 2016-09-27 MED ORDER — FUROSEMIDE 40 MG PO TABS: 40.0000 mg | ORAL_TABLET | Freq: Two times a day (BID) | ORAL | 1 refills | Status: DC

## 2016-09-27 MED ORDER — AZITHROMYCIN 250 MG PO TABS
500.0000 mg | ORAL_TABLET | Freq: Every day | ORAL | Status: DC
  Administered 2016-09-27: 500 mg via ORAL
  Filled 2016-09-27: qty 2

## 2016-09-27 MED ORDER — WARFARIN SODIUM 1 MG PO TABS
1.0000 mg | ORAL_TABLET | Freq: Once | ORAL | Status: AC
  Administered 2016-09-27: 1 mg via ORAL
  Filled 2016-09-27: qty 1

## 2016-09-27 NOTE — Progress Notes (Signed)
[facility]   83 year old male with history significant for heart disease and chronic COPD alcohol and tobacco abuse, presented with complaining of shortness of breath and increasing oxygen requirement. Patient also report having productive cough with green sputum. Patient was found to have chest x-ray concerning of mild CHF with elevated BNP of 440 patient was admitted for Lasix and treatment of COPD exacerbation.  Patient and examined report significant improvement with O2 requirement at baseline. Respiratory status improved. Given patient clinical improvement will discharge home with increased dose of Lasix and treatment for COPD.  Full discharge summary to follow  Latrelle DodrillEdwin Silva, MD

## 2016-09-27 NOTE — Progress Notes (Signed)
D/c to home.All d/c instructions given w/ verbal understanding. awaiting for ride

## 2016-09-27 NOTE — Progress Notes (Signed)
Referral for meds asst-patient states he has script coverage,pharmacy, but understands he is obligated to pay his co pay-he budgets to make sure he gets his meds. No further CM needs.

## 2016-09-27 NOTE — Evaluation (Signed)
Physical Therapy Evaluation Patient Details Name: Roberto ChristmasJames Hegler MRN: [id number] DOB: 04/08/1934 Today's Date: 09/27/2016   History of Present Illness  83 yo male admitted with COPD exac. Hx of A fib, CAD, CABG, CAD, CHF, ETOH abuse, R foot transmet amputation  Clinical Impression  On eval, pt was Min guard assist for mobility. He walked ~150 feet while holding on to the IV pole. O2 sats dropped to 80% on 4L O2 during ambulation, dyspnea 2-3/4. Pt presents with decreased activity tolerance currently. Will follow and progress activity as tolerated. Recommend daily ambulation with nursing as able.     Follow Up Recommendations No PT follow up (as long as pt continues to progress well)    Equipment Recommendations  None recommended by PT    Recommendations for Other Services       Precautions / Restrictions Precautions Precautions: Fall Precaution Comments: monitor O2 sats Restrictions Weight Bearing Restrictions: No      Mobility  Bed Mobility Overal bed mobility: Modified Independent                Transfers Overall transfer level: Modified independent                  Ambulation/Gait Ambulation/Gait assistance: Min guard Ambulation Distance (Feet): 150 Feet Assistive device:  (IV pole) Gait Pattern/deviations: Step-through pattern     General Gait Details: close guard for safety. O2 sats dropped to 80% on 4L O2 during ambulation, dyspnea 2/4.   Stairs            Wheelchair Mobility    Modified Rankin (Stroke Patients Only)       Balance                                             Pertinent Vitals/Pain Pain Assessment: No/denies pain    Home Living Family/patient expects to be discharged to:: Private residence Living Arrangements: Alone Available Help at Discharge: Family;Available PRN/intermittently Type of Home: Apartment Home Access: Level entry     Home Layout: One level Home Equipment: Gilmer MorCane - single  point Additional Comments: pt's daughter comes on Saturdays, cleans and gets his pills organized.  He had DME from wife, but gave it away because he didn't need it    Prior Function Level of Independence: Independent with assistive device(s)         Comments: cane at baseline, drives     Hand Dominance        Extremity/Trunk Assessment   Upper Extremity Assessment Upper Extremity Assessment: Overall WFL for tasks assessed    Lower Extremity Assessment Lower Extremity Assessment: Generalized weakness    Cervical / Trunk Assessment Cervical / Trunk Assessment: Normal  Communication   Communication: HOH  Cognition Arousal/Alertness: Awake/alert Behavior During Therapy: WFL for tasks assessed/performed Overall Cognitive Status: Within Functional Limits for tasks assessed                                        General Comments      Exercises     Assessment/Plan    PT Assessment Patient needs continued PT services  PT Problem List Decreased mobility;Decreased activity tolerance       PT Treatment Interventions DME instruction;Gait training;Therapeutic activities;Therapeutic exercise;Functional mobility training    PT Goals (Current  goals can be found in the Care Plan section)  Acute Rehab PT Goals Patient Stated Goal: to get better PT Goal Formulation: With patient Time For Goal Achievement: 10/11/16 Potential to Achieve Goals: Good    Frequency Min 3X/week   Barriers to discharge        Co-evaluation               AM-PAC PT "6 Clicks" Daily Activity  Outcome Measure Difficulty turning over in bed (including adjusting bedclothes, sheets and blankets)?: None Difficulty moving from lying on back to sitting on the side of the bed? : None Difficulty sitting down on and standing up from a chair with arms (e.g., wheelchair, bedside commode, etc,.)?: None Help needed moving to and from a bed to chair (including a wheelchair)?: None Help  needed walking in hospital room?: A Little Help needed climbing 3-5 steps with a railing? : A Little 6 Click Score: 22    End of Session Equipment Utilized During Treatment: Gait belt;Oxygen Activity Tolerance: Patient tolerated treatment well Patient left: in chair;with call bell/phone within reach;with chair alarm set   PT Visit Diagnosis: Muscle weakness (generalized) (M62.81);Difficulty in walking, not elsewhere classified (R26.2)    Time: 1610-9604 PT Time Calculation (min) (ACUTE ONLY): 16 min   Charges:   PT Evaluation $PT Eval Low Complexity: 1 Procedure     PT G Codes:   PT G-Codes **NOT FOR INPATIENT CLASS** Functional Assessment Tool Used: AM-PAC 6 Clicks Basic Mobility;Clinical judgement Functional Limitation: Mobility: Walking and moving around Mobility: Walking and Moving Around Current Status (V4098): At least 1 percent but less than 20 percent impaired, limited or restricted Mobility: Walking and Moving Around Goal Status [phone]): At least 1 percent but less than 20 percent impaired, limited or restricted      Rebeca Alert, MPT Pager: [phone]

## 2016-09-27 NOTE — Progress Notes (Signed)
ANTICOAGULATION CONSULT NOTE - Follow Up Consult  Pharmacy Consult for warfarin Indication: hx atrial fibrillation  Allergies  Allergen Reactions  . Prednisone Other (See Comments)    Makes pt jittery and nervous    Patient Measurements: Height: 6' (182.9 cm) Weight: 221 lb 1.9 oz (100.3 kg) IBW/kg (Calculated) : 77.6 Heparin Dosing Weight:   Vital Signs: Temp: 97.4 F (36.3 C) (05/08 0448) Temp Source: Oral (05/08 0448) BP: 132/70 (05/08 0448) Pulse Rate: 73 (05/08 0448)  Labs:  Recent Labs  09/26/16 1141 09/26/16 1147 09/27/16 0637  HGB 14.3  --   --   HCT 46.0  --   --   PLT 215  --   --   LABPROT  --  30.6* 32.7*  INR  --  2.86 3.11  CREATININE 1.10  --   --   TROPONINI <0.03  --   --     Estimated Creatinine Clearance: 65.7 mL/min (by C-G formula based on SCr of 1.1 mg/dL).   Medications:  home warfarin dose: 2.5 mg daily except 5 mg on MWF (on days when he takes 5 mg -- he's been taking 2.5 mg in AM and 2.5 mg in the evening. Informed pt that he can take both doses at the same time). He took 2.5 mg in the morning on 5/7 prior to coming to the hospital   Assessment: Patient is an 83 y.o M with hx COPD and afib on warfarin PTA, presented to the ED on 09/26/16 with c/o SOB.  To resume warfarin while inpt.  Today, 09/27/2016: - INR is slightly supratherapeutic at 3.11 today - cbc stable - no bleeding documented - drug-drug intxns: doxycycline (abx can make pt more sensitive to warfarin) - on thin liquid diet  Goal of Therapy:  INR 2-3 Monitor platelets by anticoagulation protocol: Yes   Plan:  - warfarin 1mg  PO x1 today - daily INR - monitor for s/s bleeding  Jenesis Suchy P 09/27/2016,8:18 AM

## 2016-09-28 NOTE — Discharge Summary (Signed)
Physician Discharge Summary  Nigel Ericsson  XBM:[id number]  DOB: 04-Aug-1933  DOA: 09/26/2016 PCP: Pearson Grippe, MD  Admit date: 09/26/2016 Discharge date: 09/28/2016  Admitted From: Home Disposition:  Home  Recommendations for Outpatient Follow-up:  1. Follow up with PCP in 1-2 weeks 2. Please obtain BMP/CBC in one week 3. Please follow up on the following pending results: Final blood cultures results   Home Health: None Equipment/Devices: O2 [place] 3 L   Discharge Condition: Improved  CODE STATUS: DRN Diet recommendation: Heart Healthy  Brief/Interim Summary: Perley Arthurs is as 83 y.o. male with PMHx of CAD s/p CABG, COPD on chronic O2, diastolic CHF, A fib, HTN, and HLD presented to the ED c/o SOB. Patient was admitted for suspected CHF vs COPD exacerbation. Patient was treated for both conditions with Lasix, steroids, abx and nebs. Patient clinically improved, with O2 supplementation at baseline. Patient was deemed to be stable for discharge on Prednisone for 5 days azithromycin 500 mg x 3 days and to resume home medications. Lasix dose was increase to 40 mg BID. Patient was advised to follow up with PCP within 1 week.   Subjective: Patient was seen and examined, he is no wearing his oxygen during my interview and breathing and speaking well. He report significant improvement on his breathing and feel that is back to normal. Patient continues with cough and yellow sputum. No other complaints. Patient remains afebrile and is ambulating and tolerating diet with no issues.   Discharge Diagnoses/Hospital Course:  Acute on chronic hypoxic respiratory failure - Improved  Multifactorial mainly Acute CHF, but also a component of COPD exacerbation as well.   Acute on chronic diastolic CHF  In view of poor dietary compliance. CXR shows sign of fluid overload. BNP elevated. On admission with lower extremity edema and crackles on exam.  Patient was treated with IV lasix - which significantly improved  his edema and lung findings. No edema or crackles on discharge.  Adviced to keep low salt diet, monitor amount of fluid intake, no > 1.8 L/day  Home dose Lasix increased to 40 mg BID, need to follow up with PCP for further adjustment  Daily weights  COPD exacerbation  Initially with wheezing, that responded to nebs, patient also with increase in sputum production with yellow color.  Treated with Solumedrol IV - transitioned to PO upon discharge  Prednisone 40 mg x 5 days  Azithromycin 500 mg x 3 days Continue home inhalers and nebulizer  CAD - stable asymptomatic  Continue ASA  Paroxymal Afib - HR and rhythm well controlled  Resume home dose Warfarin Not no rate controlling agents  Follow up with cardiology   All other chronic medical condition were stable during the hospitalization.  Patient was seen by physical therapy with no further recommendations.  On the day of the discharge the patient's vitals were stable, and no other acute medical condition were reported by patient. Patient was felt safe to be discharge to home  Discharge Instructions  You were cared for by a hospitalist during your hospital stay. If you have any questions about your discharge medications or the care you received while you were in the hospital after you are discharged, you can call the unit and asked to speak with the hospitalist on call if the hospitalist that took care of you is not available. Once you are discharged, your primary care physician will handle any further medical issues. Please note that NO REFILLS for any discharge medications will be authorized once  you are discharged, as it is imperative that you return to your primary care physician (or establish a relationship with a primary care physician if you do not have one) for your aftercare needs so that they can reassess your need for medications and monitor your lab values.  Discharge Instructions    Call MD for:  difficulty breathing, headache  or visual disturbances    Complete by:  As directed    Call MD for:  extreme fatigue    Complete by:  As directed    Call MD for:  hives    Complete by:  As directed    Call MD for:  persistant dizziness or light-headedness    Complete by:  As directed    Call MD for:  persistant nausea and vomiting    Complete by:  As directed    Call MD for:  redness, tenderness, or signs of infection (pain, swelling, redness, odor or green/yellow discharge around incision site)    Complete by:  As directed    Call MD for:  severe uncontrolled pain    Complete by:  As directed    Call MD for:  temperature >100.4    Complete by:  As directed    Diet - low sodium heart healthy    Complete by:  As directed    Increase activity slowly    Complete by:  As directed      Allergies as of 09/27/2016      Reactions   Prednisone Other (See Comments)   Makes pt jittery and nervous      Medication List    STOP taking these medications   budesonide-formoterol 160-4.5 MCG/ACT inhaler Commonly known as:  SYMBICORT     TAKE these medications   amLODipine 10 MG tablet Commonly known as:  NORVASC Take 1 tablet (10 mg total) by mouth daily.   arformoterol 15 MCG/2ML Nebu Commonly known as:  BROVANA Take 2 mLs (15 mcg total) by nebulization 2 (two) times daily.   aspirin EC 81 MG tablet Take 81 mg by mouth every morning.   azithromycin 500 MG tablet Commonly known as:  ZITHROMAX Take 1 tablet (500 mg total) by mouth daily.   cholecalciferol 1000 units tablet Commonly known as:  VITAMIN D Take 1,000 Units by mouth every morning.   DUONEB 0.5-2.5 (3) MG/3ML Soln Generic drug:  ipratropium-albuterol Take 3 mLs by nebulization every 6 (six) hours as needed (wheezing and shortness of breath).   furosemide 40 MG tablet Commonly known as:  LASIX Take 1 tablet (40 mg total) by mouth 2 (two) times daily. What changed:  See the new instructions.   OXYGEN Inhale 2 L into the lungs as needed  (shortness of breath).   potassium chloride SA 20 MEQ tablet Commonly known as:  K-DUR,KLOR-CON Take 20 mEq by mouth daily.   predniSONE 20 MG tablet Commonly known as:  DELTASONE Take 2 tablets (40 mg total) by mouth daily with breakfast.   simvastatin 40 MG tablet Commonly known as:  ZOCOR Take 20 mg by mouth every morning.   warfarin 2.5 MG tablet Commonly known as:  COUMADIN Take 1-2 tablets by mouth daily as directed by coumadin clinic What changed:  how much to take  how to take this  when to take this  additional instructions      Follow-up Information    Pearson Grippe, MD. Schedule an appointment as soon as possible for a visit in 1 week(s).   Specialty:  Internal Medicine Contact information: [address] [place] [address] [phone]        Marykay LexHarding, David W, MD. Schedule an appointment as soon as possible for a visit in 1 week(s).   Specialty:  Cardiology Contact information: [address] [place] [address] [phone]          Allergies  Allergen Reactions  . Prednisone Other (See Comments)    Makes pt jittery and nervous    Consultations:  None   Procedures/Studies: Dg Chest 2 View  Result Date: 09/26/2016 CLINICAL DATA:  New onset worsening shortness of breath EXAM: CHEST  2 VIEW COMPARISON:  05/19/2016 FINDINGS: There is a small right pleural effusion. There is bilateral lower lobe atelectasis. There is bilateral interstitial thickening. There is no pneumothorax. There is cardiomegaly. There is evidence of prior CABG. The osseous structures are unremarkable. IMPRESSION: 1. Five days most concerning for mild CHF. Electronically Signed   By: Elige KoHetal  Patel   On: 09/26/2016 12:02     Discharge Exam: Vitals:   09/27/16 1300 09/27/16 1302  BP: (!) 142/70   Pulse: 81 83  Resp: (!) 21   Temp: 98.6 F (37 C)    Vitals:   09/27/16 1300 09/27/16 1302 09/27/16 1935 09/27/16 1936  BP: (!) 142/70      Pulse: 81 83    Resp: (!) 21     Temp: 98.6 F (37 C)     TempSrc: Oral     SpO2: (!) 89% 92% 93% 93%  Weight:      Height:        General: Pt is alert, awake, not in acute distress, off nasal canula  Cardiovascular: RRR, S1/S2 +, no rubs, no gallops Respiratory: Good air entry, no wheezing, no rhonchi Abdominal: Soft, NT, ND, bowel sounds + Extremities: No LE edema, no cyanosis   The results of significant diagnostics from this hospitalization (including imaging, microbiology, ancillary and laboratory) are listed below for reference.     Microbiology: Recent Results (from the past 240 hour(s))  Blood Culture (routine x 2)     Status: None (Preliminary result)   Collection Time: 09/26/16 11:37 AM  Result Value Ref Range Status   Specimen Description BLOOD LEFT WRIST  Final   Special Requests IN PEDIATRIC BOTTLE Blood Culture adequate volume  Final   Culture   Final    NO GROWTH 1 DAY Performed at [facility], [address] [place], [place] [address]    Report Status PENDING  Incomplete  Blood Culture (routine x 2)     Status: None (Preliminary result)   Collection Time: 09/26/16 11:42 AM  Result Value Ref Range Status   Specimen Description BLOOD RIGHT ANTECUBITAL  Final   Special Requests   Final    BOTTLES DRAWN AEROBIC AND ANAEROBIC Blood Culture adequate volume   Culture   Final    NO GROWTH 1 DAY Performed at [facility], [address] [place], [place] [address]    Report Status PENDING  Incomplete     Labs: BNP (last 3 results)  Recent Labs  04/26/16 1232 05/18/16 1209 09/26/16 1141  BNP 247.3* 249.8* 440.5*   Basic Metabolic Panel:  Recent Labs Lab 09/26/16 1141 09/27/16 1021  NA 140 138  K 4.4 4.3  CL 98* 94*  CO2 33* 30  GLUCOSE 138* 134*  BUN 24* 30*  CREATININE 1.10 1.17  CALCIUM 9.0 9.1   Liver Function Tests:  Recent Labs Lab 09/26/16 1141  AST 16  ALT 12*  ALKPHOS 68  BILITOT 0.7  PROT 7.5  ALBUMIN 4.4    No results for input(s): LIPASE, AMYLASE in the last 168 hours. No results for input(s): AMMONIA in the last 168 hours. CBC:  Recent Labs Lab 09/26/16 1141  WBC 8.1  NEUTROABS 6.8  HGB 14.3  HCT 46.0  MCV 86.1  PLT 215   Cardiac Enzymes:  Recent Labs Lab 09/26/16 1141  TROPONINI <0.03   BNP: Invalid input(s): POCBNP CBG: No results for input(s): GLUCAP in the last 168 hours. D-Dimer No results for input(s): DDIMER in the last 72 hours. Hgb A1c No results for input(s): HGBA1C in the last 72 hours. Lipid Profile No results for input(s): CHOL, HDL, LDLCALC, TRIG, CHOLHDL, LDLDIRECT in the last 72 hours. Thyroid function studies No results for input(s): TSH, T4TOTAL, T3FREE, THYROIDAB in the last 72 hours.  Invalid input(s): FREET3 Anemia work up No results for input(s): VITAMINB12, FOLATE, FERRITIN, TIBC, IRON, RETICCTPCT in the last 72 hours. Urinalysis    Component Value Date/Time   COLORURINE YELLOW 09/26/2016 1206   APPEARANCEUR CLEAR 09/26/2016 1206   LABSPEC 1.010 09/26/2016 1206   PHURINE 5.0 09/26/2016 1206   GLUCOSEU NEGATIVE 09/26/2016 1206   HGBUR NEGATIVE 09/26/2016 1206   BILIRUBINUR NEGATIVE 09/26/2016 1206   KETONESUR NEGATIVE 09/26/2016 1206   PROTEINUR NEGATIVE 09/26/2016 1206   UROBILINOGEN 0.2 10/19/2010 1150   NITRITE NEGATIVE 09/26/2016 1206   LEUKOCYTESUR NEGATIVE 09/26/2016 1206   Sepsis Labs Invalid input(s): PROCALCITONIN,  WBC,  LACTICIDVEN Microbiology Recent Results (from the past 240 hour(s))  Blood Culture (routine x 2)     Status: None (Preliminary result)   Collection Time: 09/26/16 11:37 AM  Result Value Ref Range Status   Specimen Description BLOOD LEFT WRIST  Final   Special Requests IN PEDIATRIC BOTTLE Blood Culture adequate volume  Final   Culture   Final    NO GROWTH 1 DAY Performed at [facility], [address] [place], [place] [address]    Report Status PENDING  Incomplete  Blood Culture (routine x 2)      Status: None (Preliminary result)   Collection Time: 09/26/16 11:42 AM  Result Value Ref Range Status   Specimen Description BLOOD RIGHT ANTECUBITAL  Final   Special Requests   Final    BOTTLES DRAWN AEROBIC AND ANAEROBIC Blood Culture adequate volume   Culture   Final    NO GROWTH 1 DAY Performed at [facility], [address], [place], [place] [address]    Report Status PENDING  Incomplete     Time coordinating discharge: 30 minutes  SIGNED:  Latrelle Dodrill, MD  [facility] 09/28/2016, 12:11 AM  Pager please text page via  [email or web address] Password [email or web address]

## 2016-10-01 LAB — CULTURE, BLOOD (ROUTINE X 2)
Culture: NO GROWTH
Culture: NO GROWTH
Special Requests: ADEQUATE
Special Requests: ADEQUATE

## 2016-10-12 ENCOUNTER — Ambulatory Visit (INDEPENDENT_AMBULATORY_CARE_PROVIDER_SITE_OTHER): Payer: Medicare Other | Admitting: Pharmacist

## 2016-10-12 DIAGNOSIS — I2510 Atherosclerotic heart disease of native coronary artery without angina pectoris: Secondary | ICD-10-CM

## 2016-10-12 DIAGNOSIS — I4891 Unspecified atrial fibrillation: Secondary | ICD-10-CM | POA: Diagnosis not present

## 2016-10-12 DIAGNOSIS — Z7901 Long term (current) use of anticoagulants: Secondary | ICD-10-CM | POA: Diagnosis not present

## 2016-10-12 LAB — POCT INR: INR: 2.7

## 2016-10-18 DIAGNOSIS — E7439 Other disorders of intestinal carbohydrate absorption: Secondary | ICD-10-CM | POA: Diagnosis not present

## 2016-10-18 DIAGNOSIS — E785 Hyperlipidemia, unspecified: Secondary | ICD-10-CM | POA: Diagnosis not present

## 2016-10-18 DIAGNOSIS — I480 Paroxysmal atrial fibrillation: Secondary | ICD-10-CM | POA: Diagnosis not present

## 2016-10-18 DIAGNOSIS — Z125 Encounter for screening for malignant neoplasm of prostate: Secondary | ICD-10-CM | POA: Diagnosis not present

## 2016-10-18 DIAGNOSIS — I10 Essential (primary) hypertension: Secondary | ICD-10-CM | POA: Diagnosis not present

## 2016-10-24 DIAGNOSIS — R972 Elevated prostate specific antigen [PSA]: Secondary | ICD-10-CM | POA: Diagnosis not present

## 2016-10-24 DIAGNOSIS — I10 Essential (primary) hypertension: Secondary | ICD-10-CM | POA: Diagnosis not present

## 2016-10-24 DIAGNOSIS — J449 Chronic obstructive pulmonary disease, unspecified: Secondary | ICD-10-CM | POA: Diagnosis not present

## 2016-10-24 DIAGNOSIS — I480 Paroxysmal atrial fibrillation: Secondary | ICD-10-CM | POA: Diagnosis not present

## 2016-11-22 ENCOUNTER — Ambulatory Visit (INDEPENDENT_AMBULATORY_CARE_PROVIDER_SITE_OTHER): Payer: Medicare Other | Admitting: Pharmacist

## 2016-11-22 DIAGNOSIS — Z7901 Long term (current) use of anticoagulants: Secondary | ICD-10-CM | POA: Diagnosis not present

## 2016-11-22 DIAGNOSIS — I2510 Atherosclerotic heart disease of native coronary artery without angina pectoris: Secondary | ICD-10-CM

## 2016-11-22 DIAGNOSIS — I4891 Unspecified atrial fibrillation: Secondary | ICD-10-CM

## 2016-11-22 LAB — POCT INR: INR: 2.1

## 2016-11-28 ENCOUNTER — Other Ambulatory Visit: Payer: Self-pay | Admitting: Cardiology

## 2016-11-30 ENCOUNTER — Ambulatory Visit (INDEPENDENT_AMBULATORY_CARE_PROVIDER_SITE_OTHER): Payer: Medicare Other | Admitting: Internal Medicine

## 2016-11-30 ENCOUNTER — Encounter: Payer: Self-pay | Admitting: Internal Medicine

## 2016-11-30 VITALS — BP 130/74 | HR 87 | Ht 72.0 in | Wt 218.6 lb

## 2016-11-30 DIAGNOSIS — J449 Chronic obstructive pulmonary disease, unspecified: Secondary | ICD-10-CM

## 2016-11-30 DIAGNOSIS — J9621 Acute and chronic respiratory failure with hypoxia: Secondary | ICD-10-CM

## 2016-11-30 DIAGNOSIS — I2510 Atherosclerotic heart disease of native coronary artery without angina pectoris: Secondary | ICD-10-CM

## 2016-11-30 DIAGNOSIS — J9622 Acute and chronic respiratory failure with hypercapnia: Secondary | ICD-10-CM | POA: Diagnosis not present

## 2016-11-30 MED ORDER — BUDESONIDE-FORMOTEROL FUMARATE 160-4.5 MCG/ACT IN AERO: 2.0000 | INHALATION_SPRAY | Freq: Two times a day (BID) | RESPIRATORY_TRACT | 0 refills | Status: DC

## 2016-11-30 NOTE — Assessment & Plan Note (Signed)
He remains dependent on oxygen for sleep and with any exertion. Requalified.

## 2016-11-30 NOTE — Progress Notes (Signed)
Patient ID: Roberto Reilly, male    DOB: 07/18/1933, 83 y.o.   MRN: [id number]  HPI male former smoker followed for COPD, complicated by CAD, diastolic heart failure, PAF/chronic anticoagulation, HBP, obesity, old right thoracotomy Office Spirometry 11/30/16-very severe obstruction-FVC 1.61/37%, FEV1 0.79/26%, ratio 0.49  --------------------------------------------------   06/02/2016-83 year old male former smoker followed for COPD, complicated by CAD/CABG, diastolic heart failure, PAF/chronic anticoagulation/ Warfarin, HBP, obesity, old right thoracotomy, CKD III O2 2 L/Advanced FOLLOWS FOR: Pt was in hospital due to SOB in Dec 2017. States he continues to have hard time with SOB and wheezing with exertion. Pt uses O2 at home as rx'd. Just got POC. Post Hospital follow-up-admitted 12/27-12/31/2017 for exacerbation COPD Finances limit medication access. Had toes amputated Breathing now at baseline with little cough, routine DOE. CXR 05/19/2016- IMPRESSION: No significant change from prior exam. Chronic blunting of the right costophrenic angle with bilateral chronic rib fractures. No acute cardiopulmonary disease.   11/30/16- 83 year old male former smoker followed for COPD, complicated by CAD/CABG, diastolic heart failure, PAF/chronic anticoagulation/ Warfarin, HBP, obesity, old right thoracotomy, CKD III O2 2 L/Advanced     --- requalified today FOLLOWS FOR: WUJ:[id number]:[employer] Pt continues to wear O2 with exertion. Continues to use nebulizer BID at least to help with SOB and wheezing.  Cough is productive 2 or 3 times a day, right after he uses his nebulizer machine. Pattern is unchanged. Sputum is white. Denies chest pain or palpitation. No acute events. Always short on funding for meds. Office Spirometry 11/30/16-very severe obstruction-FVC 1.61/37%, FEV1 0.79/26%, ratio 0.49  Review of Systems- see HPI   + = pos Constitutional:   No-   weight loss, night sweats, fevers, chills, fatigue,  lassitude. HEENT:   No-   headaches, difficulty swallowing, tooth/dental problems, sore throat,                  No-   sneezing, itching, ear ache,+ nasal congestion, post nasal drip,  CV:  No-   chest pain, orthopnea, PND, swelling in lower extremities, anasarca, dizziness, palpitations GI:  No-   heartburn, indigestion, abdominal pain, nausea, vomiting,  Resp: , per HPI, +Chronic cough              No-  coughing up of blood.               change in color of mucus.  +occasional wheezing.   Skin: No-   rash or lesions. GU: . MS:  No-   joint pain or swelling.   Psych:  + change in mood or affect. + depression or anxiety.  No memory loss.   Objective:   Physical Exam    O2 2L General- Alert, Oriented, Affect-appropriate, Distress- none acute   + Overweight., + Tripod posture, +pursed lips  Skin- . + coumadin bruising Lymphadenopathy- none Head- atraumatic            Eyes- Gross vision intact, PERRLA, conjunctivae clear secretions            Ears- + Hard of hearing            Nose- Clear, No-Septal dev, mucus, polyps, erosion, perforation             Throat- Mallampati II , mucosa clear , drainage- none, tonsils- atrophic, +dentures, +hoarse Neck- flexible , trachea midline, no stridor , thyroid nl, carotid no bruit Chest - symmetrical excursion , unlabored           Heart/CV-+ nearly regular today (  no pacer) , no murmur , no gallop  , no rub, nl s1 s2                           - JVD- none , edema- none, stasis changes- none, varices- none           Lung- +coarse breath sounds,  wheeze -none heard , no- cough, dullness-none, rub- none   unlabored,            Chest wall- sternal scar Abd-  Br/ Gen/ Rectal- Not done, not indicated Extrem- + external varices on legs,  + stasis changes Neuro- grossly intact to observation

## 2016-11-30 NOTE — Assessment & Plan Note (Signed)
Near his baseline currently. Very severe obstructive airways disease Plan-we will able to give him samples of Symbicort to help him maintain his program. He depends on regular use of his nebulizer machine.

## 2016-11-30 NOTE — Patient Instructions (Signed)
Sample x 2-3 if available  Symbicort 160     Inhale 2 puffs, then rinse mouth, twice daily  Order- office spirometry     Dx COPD mixed type  Please call as needed

## 2016-12-01 ENCOUNTER — Other Ambulatory Visit: Payer: Self-pay

## 2016-12-01 MED ORDER — ATORVASTATIN CALCIUM 40 MG PO TABS: 40.0000 mg | ORAL_TABLET | Freq: Every day | ORAL | 3 refills | Status: DC

## 2016-12-15 ENCOUNTER — Telehealth: Payer: Self-pay | Admitting: Adult Health Nurse Practitioner

## 2016-12-15 ENCOUNTER — Telehealth: Payer: Self-pay | Admitting: *Deleted

## 2016-12-15 NOTE — Telephone Encounter (Signed)
Go ahead and move to same day

## 2016-12-15 NOTE — Telephone Encounter (Signed)
Error

## 2016-12-15 NOTE — Telephone Encounter (Signed)
Pt calling to move 6 week [facility] to make one trip, [place] [address] (?) I didn't feel comfortable moving Coumadin out his far, is this ok? pls call [phone]

## 2016-12-19 ENCOUNTER — Other Ambulatory Visit: Payer: Self-pay | Admitting: Cardiology

## 2016-12-21 HISTORY — PX: TRANSTHORACIC ECHOCARDIOGRAM: SHX275

## 2016-12-28 ENCOUNTER — Ambulatory Visit (INDEPENDENT_AMBULATORY_CARE_PROVIDER_SITE_OTHER): Payer: Medicare Other | Admitting: Ophthalmology

## 2017-01-17 ENCOUNTER — Encounter (HOSPITAL_COMMUNITY): Payer: Self-pay | Admitting: *Deleted

## 2017-01-17 ENCOUNTER — Inpatient Hospital Stay (HOSPITAL_COMMUNITY)
Admission: EM | Admit: 2017-01-17 | Discharge: 2017-01-21 | DRG: 291 | Disposition: A | Discharge status: Home / Self Care | Payer: Medicare Other | Attending: Internal Medicine | Admitting: Internal Medicine

## 2017-01-17 ENCOUNTER — Emergency Department (HOSPITAL_COMMUNITY): Payer: Medicare Other

## 2017-01-17 DIAGNOSIS — Z951 Presence of aortocoronary bypass graft: Secondary | ICD-10-CM

## 2017-01-17 DIAGNOSIS — Z888 Allergy status to other drugs, medicaments and biological substances status: Secondary | ICD-10-CM

## 2017-01-17 DIAGNOSIS — J90 Pleural effusion, not elsewhere classified: Secondary | ICD-10-CM | POA: Diagnosis not present

## 2017-01-17 DIAGNOSIS — Z7901 Long term (current) use of anticoagulants: Secondary | ICD-10-CM | POA: Diagnosis not present

## 2017-01-17 DIAGNOSIS — I252 Old myocardial infarction: Secondary | ICD-10-CM

## 2017-01-17 DIAGNOSIS — J9621 Acute and chronic respiratory failure with hypoxia: Secondary | ICD-10-CM | POA: Diagnosis not present

## 2017-01-17 DIAGNOSIS — J441 Chronic obstructive pulmonary disease with (acute) exacerbation: Secondary | ICD-10-CM | POA: Diagnosis present

## 2017-01-17 DIAGNOSIS — R0902 Hypoxemia: Secondary | ICD-10-CM | POA: Diagnosis not present

## 2017-01-17 DIAGNOSIS — E785 Hyperlipidemia, unspecified: Secondary | ICD-10-CM | POA: Diagnosis present

## 2017-01-17 DIAGNOSIS — I5031 Acute diastolic (congestive) heart failure: Secondary | ICD-10-CM | POA: Diagnosis not present

## 2017-01-17 DIAGNOSIS — Z8249 Family history of ischemic heart disease and other diseases of the circulatory system: Secondary | ICD-10-CM

## 2017-01-17 DIAGNOSIS — R0602 Shortness of breath: Secondary | ICD-10-CM | POA: Diagnosis not present

## 2017-01-17 DIAGNOSIS — J302 Other seasonal allergic rhinitis: Secondary | ICD-10-CM | POA: Diagnosis present

## 2017-01-17 DIAGNOSIS — I2510 Atherosclerotic heart disease of native coronary artery without angina pectoris: Secondary | ICD-10-CM | POA: Diagnosis present

## 2017-01-17 DIAGNOSIS — Z79899 Other long term (current) drug therapy: Secondary | ICD-10-CM

## 2017-01-17 DIAGNOSIS — I10 Essential (primary) hypertension: Secondary | ICD-10-CM | POA: Diagnosis not present

## 2017-01-17 DIAGNOSIS — Z7982 Long term (current) use of aspirin: Secondary | ICD-10-CM

## 2017-01-17 DIAGNOSIS — Z66 Do not resuscitate: Secondary | ICD-10-CM | POA: Diagnosis present

## 2017-01-17 DIAGNOSIS — N183 Chronic kidney disease, stage 3 unspecified: Secondary | ICD-10-CM | POA: Diagnosis present

## 2017-01-17 DIAGNOSIS — I130 Hypertensive heart and chronic kidney disease with heart failure and stage 1 through stage 4 chronic kidney disease, or unspecified chronic kidney disease: Principal | ICD-10-CM | POA: Diagnosis present

## 2017-01-17 DIAGNOSIS — S80812A Abrasion, left lower leg, initial encounter: Secondary | ICD-10-CM | POA: Diagnosis present

## 2017-01-17 DIAGNOSIS — Z9981 Dependence on supplemental oxygen: Secondary | ICD-10-CM

## 2017-01-17 DIAGNOSIS — I480 Paroxysmal atrial fibrillation: Secondary | ICD-10-CM | POA: Diagnosis not present

## 2017-01-17 DIAGNOSIS — J449 Chronic obstructive pulmonary disease, unspecified: Secondary | ICD-10-CM | POA: Diagnosis present

## 2017-01-17 DIAGNOSIS — I714 Abdominal aortic aneurysm, without rupture, unspecified: Secondary | ICD-10-CM | POA: Diagnosis present

## 2017-01-17 DIAGNOSIS — R0682 Tachypnea, not elsewhere classified: Secondary | ICD-10-CM | POA: Diagnosis not present

## 2017-01-17 DIAGNOSIS — Z87891 Personal history of nicotine dependence: Secondary | ICD-10-CM

## 2017-01-17 DIAGNOSIS — I5032 Chronic diastolic (congestive) heart failure: Secondary | ICD-10-CM | POA: Diagnosis present

## 2017-01-17 LAB — COMPREHENSIVE METABOLIC PANEL
ALBUMIN: 4.1 g/dL (ref 3.5–5.0)
ALK PHOS: 66 U/L (ref 38–126)
ALT: 14 U/L — AB (ref 17–63)
AST: 24 U/L (ref 15–41)
Anion gap: 7 (ref 5–15)
BUN: 22 mg/dL — ABNORMAL HIGH (ref 6–20)
CALCIUM: 8.8 mg/dL — AB (ref 8.9–10.3)
CO2: 28 mmol/L (ref 22–32)
CREATININE: 1.09 mg/dL (ref 0.61–1.24)
Chloride: 104 mmol/L (ref 101–111)
GFR calc Af Amer: >60 mL/min (ref >60)
GFR calc non Af Amer: >60 mL/min (ref >60)
Glucose, Bld: 97 mg/dL (ref 65–99)
Potassium: 4.6 mmol/L (ref 3.5–5.1)
SODIUM: 139 mmol/L (ref 135–145)
TOTAL PROTEIN: 6.6 g/dL (ref 6.5–8.1)
Total Bilirubin: 1.2 mg/dL (ref 0.3–1.2)

## 2017-01-17 LAB — CBC WITH DIFFERENTIAL/PLATELET
BASOS PCT: 0 %
Basophils Absolute: 0 10*3/uL (ref 0.0–0.1)
EOS ABS: 0.1 10*3/uL (ref 0.0–0.7)
Eosinophils Relative: 1 %
HEMATOCRIT: 44.1 % (ref 39.0–52.0)
HEMOGLOBIN: 14.4 g/dL (ref 13.0–17.0)
LYMPHS ABS: 1.5 10*3/uL (ref 0.7–4.0)
Lymphocytes Relative: 18 %
MCH: 28 pg (ref 26.0–34.0)
MCHC: 32.7 g/dL (ref 30.0–36.0)
MCV: 85.8 fL (ref 78.0–100.0)
Monocytes Absolute: 0.8 10*3/uL (ref 0.1–1.0)
Monocytes Relative: 9 %
NEUTROS ABS: 6 10*3/uL (ref 1.7–7.7)
NEUTROS PCT: 72 %
Platelets: 158 10*3/uL (ref 150–400)
RBC: 5.14 MIL/uL (ref 4.22–5.81)
RDW: 15.8 % — ABNORMAL HIGH (ref 11.5–15.5)
WBC: 8.4 10*3/uL (ref 4.0–10.5)

## 2017-01-17 LAB — PROTIME-INR
INR: 2.09
Prothrombin Time: 23.3 seconds — ABNORMAL HIGH (ref 11.4–15.2)

## 2017-01-17 LAB — BRAIN NATRIURETIC PEPTIDE: B NATRIURETIC PEPTIDE 5: 194.9 pg/mL — AB (ref 0.0–100.0)

## 2017-01-17 LAB — TROPONIN I: Troponin I: <0.03 ng/mL (ref <0.03)

## 2017-01-17 MED ORDER — ATORVASTATIN CALCIUM 40 MG PO TABS
40.0000 mg | ORAL_TABLET | Freq: Every day | ORAL | Status: DC
  Administered 2017-01-18 – 2017-01-21 (×4): 40 mg via ORAL
  Filled 2017-01-17 (×4): qty 1

## 2017-01-17 MED ORDER — SODIUM CHLORIDE 0.9% FLUSH
3.0000 mL | Freq: Two times a day (BID) | INTRAVENOUS | Status: DC
  Administered 2017-01-17 – 2017-01-21 (×8): 3 mL via INTRAVENOUS

## 2017-01-17 MED ORDER — ENOXAPARIN SODIUM 40 MG/0.4ML ~~LOC~~ SOLN: 40.0000 mg | SUBCUTANEOUS | Status: DC

## 2017-01-17 MED ORDER — AZITHROMYCIN 250 MG PO TABS
500.0000 mg | ORAL_TABLET | Freq: Once | ORAL | Status: AC
  Administered 2017-01-17: 500 mg via ORAL
  Filled 2017-01-17: qty 2

## 2017-01-17 MED ORDER — SODIUM CHLORIDE 0.9 % IV SOLN: 250.0000 mL | INTRAVENOUS | Status: DC | PRN

## 2017-01-17 MED ORDER — WARFARIN - PHARMACIST DOSING INPATIENT: Freq: Every day | Status: DC

## 2017-01-17 MED ORDER — IPRATROPIUM-ALBUTEROL 0.5-2.5 (3) MG/3ML IN SOLN
3.0000 mL | RESPIRATORY_TRACT | Status: DC | PRN
  Administered 2017-01-17: 3 mL via RESPIRATORY_TRACT
  Filled 2017-01-17: qty 3

## 2017-01-17 MED ORDER — WARFARIN SODIUM 2.5 MG PO TABS: 2.5000 mg | ORAL_TABLET | ORAL | Status: DC

## 2017-01-17 MED ORDER — VITAMIN D3 25 MCG (1000 UNIT) PO TABS
1000.0000 [IU] | ORAL_TABLET | Freq: Every day | ORAL | Status: DC
  Administered 2017-01-18 – 2017-01-21 (×4): 1000 [IU] via ORAL
  Filled 2017-01-17 (×4): qty 1

## 2017-01-17 MED ORDER — ONDANSETRON HCL 4 MG/2ML IJ SOLN: 4.0000 mg | Freq: Four times a day (QID) | INTRAMUSCULAR | Status: DC | PRN

## 2017-01-17 MED ORDER — DEXAMETHASONE 4 MG PO TABS
10.0000 mg | ORAL_TABLET | Freq: Once | ORAL | Status: AC
  Administered 2017-01-17: 16:00:00 10 mg via ORAL
  Filled 2017-01-17: qty 2

## 2017-01-17 MED ORDER — WARFARIN SODIUM 5 MG PO TABS
5.0000 mg | ORAL_TABLET | ORAL | Status: DC
  Administered 2017-01-18: 5 mg via ORAL
  Filled 2017-01-17: qty 1

## 2017-01-17 MED ORDER — FUROSEMIDE 10 MG/ML IJ SOLN
40.0000 mg | Freq: Two times a day (BID) | INTRAMUSCULAR | Status: DC
  Administered 2017-01-18 – 2017-01-19 (×3): 40 mg via INTRAVENOUS
  Filled 2017-01-17 (×3): qty 4

## 2017-01-17 MED ORDER — MOMETASONE FURO-FORMOTEROL FUM 200-5 MCG/ACT IN AERO
2.0000 | INHALATION_SPRAY | Freq: Two times a day (BID) | RESPIRATORY_TRACT | Status: DC
  Administered 2017-01-17 – 2017-01-21 (×8): 2 via RESPIRATORY_TRACT
  Filled 2017-01-17: qty 8.8

## 2017-01-17 MED ORDER — ASPIRIN EC 81 MG PO TBEC
81.0000 mg | DELAYED_RELEASE_TABLET | Freq: Every day | ORAL | Status: DC
  Administered 2017-01-18 – 2017-01-21 (×4): 81 mg via ORAL
  Filled 2017-01-17 (×4): qty 1

## 2017-01-17 MED ORDER — ALBUTEROL (5 MG/ML) CONTINUOUS INHALATION SOLN
10.0000 mg/h | INHALATION_SOLUTION | RESPIRATORY_TRACT | Status: DC
  Administered 2017-01-17: 10 mg/h via RESPIRATORY_TRACT
  Filled 2017-01-17: qty 20

## 2017-01-17 MED ORDER — IPRATROPIUM BROMIDE 0.02 % IN SOLN
1.0000 mg | Freq: Once | RESPIRATORY_TRACT | Status: AC
  Administered 2017-01-17: 1 mg via RESPIRATORY_TRACT
  Filled 2017-01-17: qty 5

## 2017-01-17 MED ORDER — IPRATROPIUM-ALBUTEROL 0.5-2.5 (3) MG/3ML IN SOLN
3.0000 mL | Freq: Four times a day (QID) | RESPIRATORY_TRACT | Status: DC
  Administered 2017-01-18 – 2017-01-19 (×4): 3 mL via RESPIRATORY_TRACT
  Filled 2017-01-17 (×4): qty 3

## 2017-01-17 MED ORDER — POTASSIUM CHLORIDE CRYS ER 20 MEQ PO TBCR
20.0000 meq | EXTENDED_RELEASE_TABLET | Freq: Every day | ORAL | Status: DC
  Administered 2017-01-18 – 2017-01-21 (×4): 20 meq via ORAL
  Filled 2017-01-17 (×4): qty 1

## 2017-01-17 MED ORDER — SODIUM CHLORIDE 0.9% FLUSH: 3.0000 mL | INTRAVENOUS | Status: DC | PRN

## 2017-01-17 MED ORDER — ACETAMINOPHEN 325 MG PO TABS: 650.0000 mg | ORAL_TABLET | ORAL | Status: DC | PRN

## 2017-01-17 MED ORDER — FUROSEMIDE 10 MG/ML IJ SOLN
40.0000 mg | Freq: Once | INTRAMUSCULAR | Status: AC
  Administered 2017-01-17: 40 mg via INTRAVENOUS
  Filled 2017-01-17: qty 4

## 2017-01-17 NOTE — Progress Notes (Addendum)
ANTICOAGULATION CONSULT NOTE - Initial Consult  Pharmacy Consult for coumadin Indication: atrial fibrillation  Allergies  Allergen Reactions  . Prednisone Other (See Comments)    Makes pt jittery and nervous    Patient Measurements:    Vital Signs: Temp: 98.3 F (36.8 C) (08/28 1845) Temp Source: Oral (08/28 1845) BP: 116/75 (08/28 1845) Pulse Rate: 82 (08/28 1845)  Labs:  Recent Labs  01/17/17 1543  HGB 14.4  HCT 44.1  PLT 158  CREATININE 1.09  TROPONINI <0.03    CrCl cannot be calculated (Unknown ideal weight.).   Medical History: Past Medical History:  Diagnosis Date  . AAA (abdominal aortic aneurysm) (HCC)    4.2 X 4.8 cm 04/2013 CT; declined re-eval 02/25/15  . Anxiety   . CAD in native artery March 2007   Cath for dyspnea on exertion: 75% distal LM, 85% RI, 95% mid-distal Cx, in multiple RCA lesions with 95% distal. --> Referred for CABG; has declined further noninvasive evaluation in the absence of worsening symptoms  . COPD (chronic obstructive pulmonary disease) with emphysema (HCC) 05/23/2007   Hosp 5/29-6/01/12- COPD exacerbation ONOX 12/08/10- desaturated to less than 88% for over an hour, qualifying for home O2 during sleep    . Diverticulosis of colon with hemorrhage 05/24/2013  . Dyslipidemia, goal LDL below 70   . H/O echocardiogram March ; January 2015   a) Normal LV size and cavity appeared normal function. Grade 1 diastolic dysfunction. Limited study.; b) normal LV size and function with EF 60-65%. Aortic sclerosis without stenosis, mildly increased PA pressures of but normal IVC and RA/RV size  . History of non-ST Elevation MI (myocardial infarction) March 2007   Admitted with COPD exacerbation, given by CHF. Had some chest pain with positive troponins. Cath showed multivessel disease, referred for CABG  . History of pneumonia   . Hypertension   . Hypoxia     chronic, on home O2  . Myocardial infarction (HCC)   . Obesity (BMI 30-39.9)     One year ago weighed 265 pounds, (12/2012) -- now 234 pounds  . PAF (paroxysmal atrial fibrillation) (HCC)    Anticoagulated on warfarin. Stable -- post op  . Pneumonia    hx  . 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 Seasonal allergies   . Shortness of breath dyspnea     Medications:  Prescriptions Prior to Admission  Medication Sig Dispense Refill Last Dose  . amLODipine (NORVASC) 10 MG tablet Take 1 tablet (10 mg total) by mouth daily. 30 tablet 1 01/17/2017 at Unknown time  . aspirin EC 81 MG tablet Take 81 mg by mouth every morning.    01/17/2017 at Unknown time  . atorvastatin (LIPITOR) 40 MG tablet Take 1 tablet (40 mg total) by mouth daily. 90 tablet 3 01/17/2017 at Unknown time  . budesonide-formoterol (SYMBICORT) 160-4.5 MCG/ACT inhaler Inhale 2 puffs into the lungs 2 (two) times daily. 2 Inhaler 0 01/17/2017 at Unknown time  . cholecalciferol (VITAMIN D) 1000 UNITS tablet Take 1,000 Units by mouth every morning.    01/17/2017 at Unknown time  . furosemide (LASIX) 40 MG tablet Take 1 tablet (40 mg total) by mouth 2 (two) times daily. 90 tablet 1 01/17/2017 at Unknown time  . ipratropium-albuterol (DUONEB) 0.5-2.5 (3) MG/3ML SOLN Take 3 mLs by nebulization every 6 (six) hours as needed (wheezing and shortness of breath).    unknown  . OXYGEN Inhale 3 L into the  lungs as needed (shortness of breath).    unknown  . potassium chloride SA (K-DUR,KLOR-CON) 20 MEQ tablet TAKE 1 TABLET (20 MEQ TOTAL) BY MOUTH DAILY. 30 tablet 8 01/17/2017 at Unknown time  . warfarin (COUMADIN) 2.5 MG tablet TAKE 1-2 TABLETS BY MOUTH DAILY AS DIRECTED BY COUMADIN CLININC (Patient taking differently: TAKE 1 tablet daily except for MWF takes two tablets.) 45 tablet 2 01/17/2017 at 0800    Assessment: 83 yo M on coumadin PTA for afib.  Per coumadin clinic his last INR 11/22/16 2.1 on 2.5 mg qday x 5 mg MWF last dose 8/28 at 08 am.  Hg 14.4, pltc 158. No bleeding reported.  Pt has taken his  coumadin for today.  INR on admission is therapeutic at 2.09.  Goal of Therapy:  INR 2-3 Monitor platelets by anticoagulation protocol: Yes   Plan:  He took coumadin 2.5 mg at 08 am this morning Daily INR Continue home regimen of 5 mg MWF and 2.5 TTSS  Herby Abraham, Pharm.D. [phone] 01/17/2017 7:22 PM

## 2017-01-17 NOTE — H&P (Signed)
History and Physical    Roberto Reilly ZOX:[id number] DOB: [birth year] DOA: 01/17/2017  PCP: Pearson Grippe, MD Patient coming from: Home  Chief Complaint: Dyspnea  HPI: Roberto Reilly is a 83 y.o. male with medical history significant of COPD on chronic 3 L oxygen, diastolic heart failure, CAD status post CABG, history of MI, paroxysmal atrial fibrillation on chronic Coumadin, hyperlipidemia, diverticulosis, AAA. 10 days ago, patient started having symptoms of dyspnea with associated orthopnea. He tried to use his albuterol which did not help with his symptoms. Dyspnea progressively worsened. He is also had worsening dyspnea on exertion. At baseline, patient can barely walk about 100 yards without significant dyspnea. Patient reports some rhinorrhea, otherwise no URI symptoms. He does report drinking more fluid because he thought he was dehydrated. No change in dietary salt intake. He reports taking his Lasix daily.  ED Course: Vitals: Afebrile, normal pulse, respirations, and low to mid 20s, blood pressure initially slightly hypertensive and mouth normal secondary to Lasix, 91-95% 3 L of oxygen. Labs: Creatinine of 1.09, BUN is 22, BNP of 194.9 Imaging: Chest x-ray significant for respiratory congestion and right pleural effusion Medications/Course: Albuterol, Atrovent, Decadron, Lasix.  Review of Systems: Review of Systems  Constitutional: Negative for chills and fever.  Respiratory: Positive for cough, sputum production, shortness of breath and wheezing. Negative for hemoptysis.   Cardiovascular: Positive for orthopnea and leg swelling. Negative for chest pain and palpitations.  Gastrointestinal: Negative for abdominal pain, constipation, diarrhea, nausea and vomiting.  Psychiatric/Behavioral: Negative for suicidal ideas.  All other systems reviewed and are negative.   Past Medical History:  Diagnosis Date  . AAA (abdominal aortic aneurysm) (HCC)    4.2 X 4.8 cm 04/2013 CT; declined re-eval  02/25/15  . Anxiety   . CAD in native artery March 2007   Cath for dyspnea on exertion: 75% distal LM, 85% RI, 95% mid-distal Cx, in multiple RCA lesions with 95% distal. --> Referred for CABG; has declined further noninvasive evaluation in the absence of worsening symptoms  . COPD (chronic obstructive pulmonary disease) with emphysema (HCC) 05/23/2007   Hosp 5/29-6/01/12- COPD exacerbation ONOX 12/08/10- desaturated to less than 88% for over an hour, qualifying for home O2 during sleep    . Diverticulosis of colon with hemorrhage 05/24/2013  . Dyslipidemia, goal LDL below 70   . H/O echocardiogram March ; January 2015   a) Normal LV size and cavity appeared normal function. Grade 1 diastolic dysfunction. Limited study.; b) normal LV size and function with EF 60-65%. Aortic sclerosis without stenosis, mildly increased PA pressures of but normal IVC and RA/RV size  . History of non-ST Elevation MI (myocardial infarction) March 2007   Admitted with COPD exacerbation, given by CHF. Had some chest pain with positive troponins. Cath showed multivessel disease, referred for CABG  . History of pneumonia   . Hypertension   . Hypoxia     chronic, on home O2  . Myocardial infarction (HCC)   . Obesity (BMI 30-39.9)    One year ago weighed 265 pounds, (12/2012) -- now 234 pounds  . PAF (paroxysmal atrial fibrillation) (HCC)    Anticoagulated on warfarin. Stable -- post op  . Pneumonia    hx  . 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 Seasonal allergies   . Shortness of breath dyspnea     Past Surgical History:  Procedure Laterality Date  . ACHILLES TENDON LENGTHENING Right 10/08/2015   Procedure: ACHILLES  TENDON LENGTHENING;  Surgeon: Toni Arthurs, MD;  Location: [facility];  Service: Orthopedics;  Laterality: Right;  . AMPUTATION Right 10/08/2015   Procedure: RIGHT FOOT TRANSMET AMPUTATION;  Surgeon: Toni Arthurs, MD;  Location: [facility] OR;  Service: Orthopedics;  Laterality: Right;   . CARDIAC CATHETERIZATION  03 28 2007   NORMAL LV FUNCTION/ ABDOMINAL AORTA STENOSIS,75%-85%. RIGHT FEMORAL ARTERY :CATHETERS USED A  4-FRENCH WITH A 4-FRENCH SHEATH  . CATARACT EXTRACTION     Laser  . COLONOSCOPY N/A 05/24/2013   Procedure: COLONOSCOPY;  Surgeon: Iva Boop, MD;  Location: [facility] ENDOSCOPY;  Service: Endoscopy;  Laterality: N/A;  . CORONARY ARTERY BYPASS GRAFT    . RLL resection for Hamartoma     lungs  . RUL for hamartoma     lung     reports that he quit smoking about 18 years ago. His smoking use included Cigarettes. He has a 110.00 pack-year smoking history. He has never used smokeless tobacco. He reports that he does not drink alcohol or use drugs.  Allergies  Allergen Reactions  . Prednisone Other (See Comments)    Makes pt jittery and nervous    Family History  Problem Relation Age of Onset  . Heart attack Mother   . Heart attack Father     Prior to Admission medications   Medication Sig Start Date End Date Taking? Authorizing Provider  amLODipine (NORVASC) 10 MG tablet Take 1 tablet (10 mg total) by mouth daily. 05/01/13  Yes Dorothea Ogle, MD  aspirin EC 81 MG tablet Take 81 mg by mouth every morning.    Yes [provider]  atorvastatin (LIPITOR) 40 MG tablet Take 1 tablet (40 mg total) by mouth daily. 12/01/16 03/01/17 Yes Marykay Lex, MD  budesonide-formoterol [facility]) 160-4.5 MCG/ACT inhaler Inhale 2 puffs into the lungs 2 (two) times daily. 11/30/16  Yes Young, Joni Fears D, MD  cholecalciferol (VITAMIN D) 1000 UNITS tablet Take 1,000 Units by mouth every morning.    Yes [provider]  furosemide (LASIX) 40 MG tablet Take 1 tablet (40 mg total) by mouth 2 (two) times daily. 09/27/16  Yes Randel Pigg, Dorma Russell, MD  ipratropium-albuterol (DUONEB) 0.5-2.5 (3) MG/3ML SOLN Take 3 mLs by nebulization every 6 (six) hours as needed (wheezing and shortness of breath).    Yes [provider]  OXYGEN Inhale 3 L into the lungs as  needed (shortness of breath).    Yes [provider]  potassium chloride SA (K-DUR,KLOR-CON) 20 MEQ tablet TAKE 1 TABLET (20 MEQ TOTAL) BY MOUTH DAILY. 11/28/16  Yes Marykay Lex, MD  warfarin (COUMADIN) 2.5 MG tablet TAKE 1-2 TABLETS BY MOUTH DAILY AS DIRECTED BY COUMADIN CLININC Patient taking differently: TAKE 1 tablet daily except for MWF takes two tablets. 12/19/16  Yes Marykay Lex, MD    Physical Exam: Vitals:   01/17/17 1532 01/17/17 1600 01/17/17 1630 01/17/17 1641  BP:  (!) 146/70 (!) 146/73   Pulse:  75 (!) 101   Resp:  (!) 21 (!) 28   Temp:      TempSrc:      SpO2: 95% 100% 94% 100%     Constitutional: NAD, calm, comfortable Eyes: PERRL, lids and conjunctivae normal ENMT: Mucous membranes are moist. Posterior pharynx clear of any exudate or lesions. Dentures. Hard of hearing. Neck: normal, supple, no masses, no thyromegaly Respiratory: Crackles right lower lobe with peripheral expiratory phase and no wheezing. Pursed lips breathing after long conversation.  Cardiovascular: Muffled heart  sounds, sounds regular with normal rate. 1+ pedal pulses. No carotid bruits Abdomen: no tenderness, no masses palpated. No hepatosplenomegaly. Bowel sounds positive.  Musculoskeletal: no clubbing / cyanosis. No joint deformity upper and lower extremities. Good ROM, no contractures. Normal muscle tone. Right transmetatarsal amputation Skin: left shin with eschar and ulcerated lesion Neurologic: CN 2-12 grossly intact. Sensation intact, DTR normal. Strength 5/5 in all 4.  Psychiatric: Normal judgment and insight. Alert and oriented x 3. Depressed mood.   Labs on Admission: I have personally reviewed following labs and imaging studies  CBC:  Recent Labs Lab 01/17/17 1543  WBC 8.4  NEUTROABS 6.0  HGB 14.4  HCT 44.1  MCV 85.8  PLT 158   Basic Metabolic Panel:  Recent Labs Lab 01/17/17 1543  NA 139  K 4.6  CL 104  CO2 28  GLUCOSE 97  BUN 22*  CREATININE 1.09    CALCIUM 8.8*   GFR: CrCl cannot be calculated (Unknown ideal weight.). Liver Function Tests:  Recent Labs Lab 01/17/17 1543  AST 24  ALT 14*  ALKPHOS 66  BILITOT 1.2  PROT 6.6  ALBUMIN 4.1   No results for input(s): LIPASE, AMYLASE in the last 168 hours. No results for input(s): AMMONIA in the last 168 hours. Coagulation Profile: No results for input(s): INR, PROTIME in the last 168 hours. Cardiac Enzymes:  Recent Labs Lab 01/17/17 1543  TROPONINI <0.03   BNP (last 3 results) No results for input(s): PROBNP in the last 8760 hours. HbA1C: No results for input(s): HGBA1C in the last 72 hours. CBG: No results for input(s): GLUCAP in the last 168 hours. Lipid Profile: No results for input(s): CHOL, HDL, LDLCALC, TRIG, CHOLHDL, LDLDIRECT in the last 72 hours. Thyroid Function Tests: No results for input(s): TSH, T4TOTAL, FREET4, T3FREE, THYROIDAB in the last 72 hours. Anemia Panel: No results for input(s): VITAMINB12, FOLATE, FERRITIN, TIBC, IRON, RETICCTPCT in the last 72 hours. Urine analysis:    Component Value Date/Time   COLORURINE YELLOW 09/26/2016 1206   APPEARANCEUR CLEAR 09/26/2016 1206   LABSPEC 1.010 09/26/2016 1206   PHURINE 5.0 09/26/2016 1206   GLUCOSEU NEGATIVE 09/26/2016 1206   HGBUR NEGATIVE 09/26/2016 1206   BILIRUBINUR NEGATIVE 09/26/2016 1206   KETONESUR NEGATIVE 09/26/2016 1206   PROTEINUR NEGATIVE 09/26/2016 1206   UROBILINOGEN 0.2 10/19/2010 1150   NITRITE NEGATIVE 09/26/2016 1206   LEUKOCYTESUR NEGATIVE 09/26/2016 1206   Sepsis Labs: !!!!!!!!!!!!!!!!!!!!!!!!!!!!!!!!!!!!!!!!!!!! @LABRCNTIP (procalcitonin:4,lacticidven:4) )No results found for this or any previous visit (from the past 240 hour(s)).   Radiological Exams on Admission: Dg Chest 2 View  Result Date: 01/17/2017 CLINICAL DATA:  Shortness of Breath EXAM: CHEST  2 VIEW COMPARISON:  09/26/2016 FINDINGS: Prior CABG. Cardiomegaly. Small to moderate right pleural effusion is stable.  Right lower lobe atelectasis. Vascular congestion. No acute bony abnormality. IMPRESSION: Cardiomegaly with vascular congestion. Right pleural effusion with right base atelectasis. Findings unchanged. Electronically Signed   By: Charlett Nose M.D.   On: 01/17/2017 13:15    EKG: Independently reviewed. Atrial fibrillation. RBBB  Assessment/Plan Principal Problem:   Acute diastolic CHF (congestive heart failure) (HCC) Active Problems:   Chronic anticoagulation   CAD in native artery - LM, RCA, RI & Cx disease --> CABG x 4 (LIMA-LAD, SVG-OM/RI, SVG-rPDA-rPL)   S/P CABG x 4   Paroxysmal atrial fibrillation (HCC); CHA2DS2-VASc Score = 4. On Warfarin   Dyslipidemia, goal LDL below 70   Essential hypertension   AAA (abdominal aortic aneurysm) without rupture seen on CT scan  Pleural effusion   Acute and chronic respiratory failure with hypoxia (HCC)   Chronic renal failure syndrome, stage 3 (moderate)   Acute diastolic heart failure Last echocardiogram in June 2017 significant for an EF of 55% to 60% with no mention of diastolic dysfunction. BNP decreased from May. Initial improvement in symptoms with IV Lasix in the ED. Does not appear to have a COPD component to his respiratory failure. -Daily weights and strict in and out -Lasix 40 mg IV twice a day -Heart healthy diet -echocardiogram -potassiumsupplementation  COPD Does not appear to be in exacerbation. No wheezing on exam. Patient was given significant amount of treatment, the EMS and in the emergency department. -Continue Symbicort and DuoNeb  Acute on chronic respiratory failure with hypoxia. Patient is stable on room air, but dropped to 80% while getting up. Patient with a history of right lower lobe resection secondary to hamartoma. -Continue oxygen -PT eval  Paroxysmal atrial fibrillation. Recurrent controlled. Patient on chronic Coumadin. -Warfarin per pharmacy  CKD 3 Stable   AAA Patient declining further  workup/management of this issue.  Essential hypertension -hold amlodipine secondary to soft BPs in setting of diuresis  CAD s/p CABG Hyperlipidemia -Continue atorvastatin. -Continue aspirin  Leg lesion Secondary to abrasion. Eschar formed. Chronic venous skin changes as well. Not warm. 1+pedal pulses -wound care  Goals of care Patient is ready to die and is tired of continued chronic management of his disease. Patient is not suicidal, however. Discussed palliative care and patient agreeable to meet with them prior to discharge. Patient would like to feel better before going home. Patient may be a candidate for hospice at this point.   DVT prophylaxis: Warfarin Code Status: DNR Family Communication: None at bedside Disposition Plan: Discharge home in 24 hours if improved Consults called: Palliative care Admission status: Observation, telemetry   Jacquelin Hawking, MD [facility] Pager [phone]  If 7PM-7AM, please contact night-coverage [email or web address] Password [facility]  01/17/2017, 5:38 PM

## 2017-01-17 NOTE — ED Notes (Signed)
Pt states that he doesn't feel better or worse after breathing treatment.  Sitting on side of bed for comfort.

## 2017-01-17 NOTE — ED Notes (Signed)
Pt using accessory muscles to breathe.  Insisted on standing at bedside to void.  Standby assistance given.  RA sats dropped to 86%.  Placed on 2L/[place] and up to 97% after about 5 minutes.  Pt sitting up at the bedside and continues to work hard to breathe.  Sats come up as high as 99% on 2L/[place] so decreased to 1.5L/ w/sats staying around 96%.  Will continue to monitor.  Pt has access to call bell.

## 2017-01-17 NOTE — ED Provider Notes (Signed)
[facility]-EMERGENCY DEPT Provider Note   CSN: [id number] Arrival date & time: 01/17/17  1220     History   Chief Complaint Chief Complaint  Patient presents with  . Shortness of Breath    HPI Roberto Reilly is a 83 y.o. male.  HPI  This 83 year old male past history of CHF, COPD presents to the emergency department today via EMS secondary to at least 1 week of progressive worsening short of breath, cough that is productive and weakness. No fevers. Is on 3 L of oxygen at home has not had any increase that recently but just feels similar short of breath we can complete his daily activities. He states multiple times he doesn't want to live anymore. He states is not suicidal and will not actively kill himself but doesn't want prolonged hospital bills or treatments and just wants to die comfortably. Not really any chest pain or lower show any swelling but does have a wound to his left lower leg with some erythema around it from where he scratched himself. No other associated/modifying symptoms.   Past Medical History:  Diagnosis Date  . AAA (abdominal aortic aneurysm) (HCC)    4.2 X 4.8 cm 04/2013 CT; declined re-eval 02/25/15  . Anxiety   . CAD in native artery March 2007   Cath for dyspnea on exertion: 75% distal LM, 85% RI, 95% mid-distal Cx, in multiple RCA lesions with 95% distal. --> Referred for CABG; has declined further noninvasive evaluation in the absence of worsening symptoms  . COPD (chronic obstructive pulmonary disease) with emphysema (HCC) 05/23/2007   Hosp 5/29-6/01/12- COPD exacerbation ONOX 12/08/10- desaturated to less than 88% for over an hour, qualifying for home O2 during sleep    . Diverticulosis of colon with hemorrhage 05/24/2013  . Dyslipidemia, goal LDL below 70   . H/O echocardiogram March ; January 2015   a) Normal LV size and cavity appeared normal function. Grade 1 diastolic dysfunction. Limited study.; b) normal LV size and function with EF 60-65%. Aortic sclerosis  without stenosis, mildly increased PA pressures of but normal IVC and RA/RV size  . History of non-ST Elevation MI (myocardial infarction) March 2007   Admitted with COPD exacerbation, given by CHF. Had some chest pain with positive troponins. Cath showed multivessel disease, referred for CABG  . History of pneumonia   . Hypertension   . Hypoxia     chronic, on home O2  . Myocardial infarction (HCC)   . Obesity (BMI 30-39.9)    One year ago weighed 265 pounds, (12/2012) -- now 234 pounds  . PAF (paroxysmal atrial fibrillation) (HCC)    Anticoagulated on warfarin. Stable -- post op  . Pneumonia    hx  . 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 Seasonal allergies   . Shortness of breath dyspnea     Patient Active Problem List   Diagnosis Date Noted  . Acute bronchitis due to respiratory syncytial virus (RSV) 05/20/2016  . Acute on chronic respiratory failure with hypoxia and hypercapnia (HCC) 05/19/2016  . Shortness of breath 05/18/2016  . Chronic renal failure syndrome, stage 3 (moderate) 05/18/2016  . Hypercapnia 05/18/2016  . Hyperglycemia   . Chronic osteomyelitis of right foot (HCC) 02/05/2016  . Acute and chronic respiratory failure with hypoxia (HCC)   . Acute on chronic respiratory failure (HCC) 11/03/2015  . Abnormal TSH 11/03/2015  . Pleural effusion 11/01/2015  . Acute renal failure (ARF) (HCC) 11/01/2015  .  Sepsis (HCC) 11/01/2015  . Pneumonia 11/01/2015  . COPD exacerbation (HCC) 11/01/2015  . Encounter for long-term (current) use of other medications 01/07/2014  . Nasal sinus congestion 01/07/2014  . Diverticulosis of colon with hemorrhage 05/24/2013  . Internal hemorrhoids 05/24/2013  . AAA (abdominal aortic aneurysm) without rupture seen on CT scan 05/22/2013  . Rectal bleeding 05/21/2013  . Anemia 05/20/2013  . Generalized anxiety disorder 05/09/2013  . Auditory hallucination 05/09/2013  . COPD mixed type (HCC) 05/07/2013  .  COPD with acute exacerbation (HCC) 05/07/2013  . Chest discomfort 05/07/2013  . Hx of scabies 04/29/2013  . Essential hypertension 04/29/2013  . Obesity (BMI 30-39.9) 01/18/2013  . Paroxysmal atrial fibrillation (HCC); CHA2DS2-VASc Score = 4. On Warfarin   . Dyslipidemia, goal LDL below 70   . Chronic anticoagulation 08/09/2012  . Reactive depression (situational) 09/06/2011  . CAD 05/23/2007  . CAD in native artery - LM, RCA, RI & Cx disease --> CABG x 4 (LIMA-LAD, SVG-OM/RI, SVG-rPDA-rPL) 07/21/2005    Class: History of  . S/P CABG x 4 07/21/2005    Past Surgical History:  Procedure Laterality Date  . ACHILLES TENDON LENGTHENING Right 10/08/2015   Procedure: ACHILLES TENDON LENGTHENING;  Surgeon: Toni Arthurs, MD;  Location: [facility] OR;  Service: Orthopedics;  Laterality: Right;  . AMPUTATION Right 10/08/2015   Procedure: RIGHT FOOT TRANSMET AMPUTATION;  Surgeon: Toni Arthurs, MD;  Location: [facility] OR;  Service: Orthopedics;  Laterality: Right;  . CARDIAC CATHETERIZATION  03 28 2007   NORMAL LV FUNCTION/ ABDOMINAL AORTA STENOSIS,75%-85%. RIGHT FEMORAL ARTERY :CATHETERS USED A  4-FRENCH WITH A 4-FRENCH SHEATH  . CATARACT EXTRACTION     Laser  . COLONOSCOPY N/A 05/24/2013   Procedure: COLONOSCOPY;  Surgeon: Iva Boop, MD;  Location: [facility] ENDOSCOPY;  Service: Endoscopy;  Laterality: N/A;  . CORONARY ARTERY BYPASS GRAFT    . RLL resection for Hamartoma     lungs  . RUL for hamartoma     lung       Home Medications    Prior to Admission medications   Medication Sig Start Date End Date Taking? Authorizing Provider  amLODipine (NORVASC) 10 MG tablet Take 1 tablet (10 mg total) by mouth daily. 05/01/13  Yes Dorothea Ogle, MD  aspirin EC 81 MG tablet Take 81 mg by mouth every morning.    Yes [provider]  atorvastatin (LIPITOR) 40 MG tablet Take 1 tablet (40 mg total) by mouth daily. 12/01/16 03/01/17 Yes Marykay Lex, MD  budesonide-formoterol [facility]) 160-4.5 MCG/ACT  inhaler Inhale 2 puffs into the lungs 2 (two) times daily. 11/30/16  Yes Young, Joni Fears D, MD  cholecalciferol (VITAMIN D) 1000 UNITS tablet Take 1,000 Units by mouth every morning.    Yes [provider]  furosemide (LASIX) 40 MG tablet Take 1 tablet (40 mg total) by mouth 2 (two) times daily. 09/27/16  Yes Randel Pigg, Dorma Russell, MD  ipratropium-albuterol (DUONEB) 0.5-2.5 (3) MG/3ML SOLN Take 3 mLs by nebulization every 6 (six) hours as needed (wheezing and shortness of breath).    Yes [provider]  OXYGEN Inhale 3 L into the lungs as needed (shortness of breath).    Yes [provider]  potassium chloride SA (K-DUR,KLOR-CON) 20 MEQ tablet TAKE 1 TABLET (20 MEQ TOTAL) BY MOUTH DAILY. 11/28/16  Yes Marykay Lex, MD  warfarin (COUMADIN) 2.5 MG tablet TAKE 1-2 TABLETS BY MOUTH DAILY AS DIRECTED BY COUMADIN CLININC Patient taking differently: TAKE 1 tablet daily except for  MWF takes two tablets. 12/19/16  Yes Marykay Lex, MD    Family History Family History  Problem Relation Age of Onset  . Heart attack Mother   . Heart attack Father     Social History Social History  Substance Use Topics  . Smoking status: Former Smoker    Packs/day: 2.00    Years: 55.00    Types: Cigarettes    Quit date: 05/23/1998  . Smokeless tobacco: Never Used  . Alcohol use No     Allergies   Prednisone   Review of Systems Review of Systems  All other systems reviewed and are negative.    Physical Exam Updated Vital Signs BP 122/85   Pulse 86   Temp 97.7 F (36.5 C) (Oral)   Resp (!) 23   SpO2 (!) 86%   Physical Exam  Constitutional: He appears well-developed and well-nourished. He appears distressed (tachypneic).  HENT:  Head: Normocephalic and atraumatic.  Eyes: Conjunctivae and EOM are normal.  Neck: Normal range of motion.  Pulmonary/Chest: He is in respiratory distress. He has wheezes.  Abdominal: Soft. Bowel sounds are normal.  Musculoskeletal: Normal  range of motion. He exhibits no edema or deformity.  Neurological: He is alert.  Skin: Skin is warm and dry.  Healing LLE lateral shin wound with surrounding erythema without drainage, tenderness or edema but is slightly warm.      ED Treatments / Results  Labs (all labs ordered are listed, but only abnormal results are displayed) Labs Reviewed  CBC WITH DIFFERENTIAL/PLATELET  COMPREHENSIVE METABOLIC PANEL  TROPONIN I  BRAIN NATRIURETIC PEPTIDE    EKG  EKG Interpretation None       Radiology Dg Chest 2 View  Result Date: 01/17/2017 CLINICAL DATA:  Shortness of Breath EXAM: CHEST  2 VIEW COMPARISON:  09/26/2016 FINDINGS: Prior CABG. Cardiomegaly. Small to moderate right pleural effusion is stable. Right lower lobe atelectasis. Vascular congestion. No acute bony abnormality. IMPRESSION: Cardiomegaly with vascular congestion. Right pleural effusion with right base atelectasis. Findings unchanged. Electronically Signed   By: Charlett Nose M.D.   On: 01/17/2017 13:15    Procedures Procedures (including critical care time)  Medications Ordered in ED Medications  albuterol (PROVENTIL,VENTOLIN) solution continuous neb (not administered)  ipratropium (ATROVENT) nebulizer solution 1 mg (not administered)  azithromycin (ZITHROMAX) tablet 500 mg (not administered)  dexamethasone (DECADRON) tablet 10 mg (not administered)     Initial Impression / Assessment and Plan / ED Course  I have reviewed the triage vital signs and the nursing notes.  Pertinent labs & imaging results that were available during my care of the patient were reviewed by me and considered in my medical decision making (see chart for details).    Will workup for causes of sob and offer treatment. Secondary to comments, will consult palliative care for advice. Will admit if patient is willing. Likely copd exqcerbation. Palliative consulted. No improvement with albuterol, possibly chf instead. Still hypoxic on his  home oxygen just with standing. Will admit.   Final Clinical Impressions(s) / ED Diagnoses   Final diagnoses:  Hypoxia      Ukiah Trawick, Barbara Cower, MD 01/17/17 2356

## 2017-01-17 NOTE — ED Notes (Signed)
Bed: WA08 Expected date:  Expected time:  Means of arrival:  Comments: 82 yo m shortness of breath

## 2017-01-17 NOTE — ED Triage Notes (Signed)
Patient with history of COPD BIB EMS from home. Patient c/o increased SOB x1 week. Patient reports using albuterol 4x per day, but states in the past week, it has no longer helped. Patient reports for the past week, he "just cant catch his breath." Patient uses home O2 as needed, but states "it doesn't help either." Patient received 5mg  Albuterol treatment by EMS. Patient refused atrovent treatment. Per report, patient has inspiratory and expiratory wheezes throught, after the treatment, but has oxygen saturations 94-95% on RA.

## 2017-01-17 NOTE — ED Notes (Addendum)
Pt stood up next to bed O2 dropped to 88 on 3L O2. RN notified.

## 2017-01-17 NOTE — ED Notes (Signed)
Report [id number]. Christy RN accepting pt. [phone]

## 2017-01-18 ENCOUNTER — Ambulatory Visit: Payer: Medicare Other | Admitting: Cardiology

## 2017-01-18 ENCOUNTER — Observation Stay (HOSPITAL_COMMUNITY): Payer: Medicare Other

## 2017-01-18 DIAGNOSIS — R0602 Shortness of breath: Secondary | ICD-10-CM | POA: Diagnosis not present

## 2017-01-18 DIAGNOSIS — Z79899 Other long term (current) drug therapy: Secondary | ICD-10-CM | POA: Diagnosis not present

## 2017-01-18 DIAGNOSIS — J441 Chronic obstructive pulmonary disease with (acute) exacerbation: Secondary | ICD-10-CM | POA: Diagnosis present

## 2017-01-18 DIAGNOSIS — I714 Abdominal aortic aneurysm, without rupture: Secondary | ICD-10-CM | POA: Diagnosis present

## 2017-01-18 DIAGNOSIS — I509 Heart failure, unspecified: Secondary | ICD-10-CM

## 2017-01-18 DIAGNOSIS — Z8249 Family history of ischemic heart disease and other diseases of the circulatory system: Secondary | ICD-10-CM | POA: Diagnosis not present

## 2017-01-18 DIAGNOSIS — J449 Chronic obstructive pulmonary disease, unspecified: Secondary | ICD-10-CM | POA: Diagnosis present

## 2017-01-18 DIAGNOSIS — Z888 Allergy status to other drugs, medicaments and biological substances status: Secondary | ICD-10-CM | POA: Diagnosis not present

## 2017-01-18 DIAGNOSIS — I2510 Atherosclerotic heart disease of native coronary artery without angina pectoris: Secondary | ICD-10-CM | POA: Diagnosis present

## 2017-01-18 DIAGNOSIS — S80812A Abrasion, left lower leg, initial encounter: Secondary | ICD-10-CM | POA: Diagnosis present

## 2017-01-18 DIAGNOSIS — J9621 Acute and chronic respiratory failure with hypoxia: Secondary | ICD-10-CM | POA: Diagnosis present

## 2017-01-18 DIAGNOSIS — I5031 Acute diastolic (congestive) heart failure: Secondary | ICD-10-CM | POA: Diagnosis not present

## 2017-01-18 DIAGNOSIS — N183 Chronic kidney disease, stage 3 (moderate): Secondary | ICD-10-CM | POA: Diagnosis present

## 2017-01-18 DIAGNOSIS — Z951 Presence of aortocoronary bypass graft: Secondary | ICD-10-CM | POA: Diagnosis not present

## 2017-01-18 DIAGNOSIS — J90 Pleural effusion, not elsewhere classified: Secondary | ICD-10-CM | POA: Diagnosis present

## 2017-01-18 DIAGNOSIS — E785 Hyperlipidemia, unspecified: Secondary | ICD-10-CM | POA: Diagnosis present

## 2017-01-18 DIAGNOSIS — Z7901 Long term (current) use of anticoagulants: Secondary | ICD-10-CM | POA: Diagnosis not present

## 2017-01-18 DIAGNOSIS — I130 Hypertensive heart and chronic kidney disease with heart failure and stage 1 through stage 4 chronic kidney disease, or unspecified chronic kidney disease: Secondary | ICD-10-CM | POA: Diagnosis not present

## 2017-01-18 DIAGNOSIS — R0902 Hypoxemia: Secondary | ICD-10-CM | POA: Diagnosis not present

## 2017-01-18 DIAGNOSIS — Z9981 Dependence on supplemental oxygen: Secondary | ICD-10-CM | POA: Diagnosis not present

## 2017-01-18 DIAGNOSIS — I480 Paroxysmal atrial fibrillation: Secondary | ICD-10-CM | POA: Diagnosis present

## 2017-01-18 DIAGNOSIS — Z87891 Personal history of nicotine dependence: Secondary | ICD-10-CM | POA: Diagnosis not present

## 2017-01-18 DIAGNOSIS — Z7982 Long term (current) use of aspirin: Secondary | ICD-10-CM | POA: Diagnosis not present

## 2017-01-18 DIAGNOSIS — Z66 Do not resuscitate: Secondary | ICD-10-CM | POA: Diagnosis present

## 2017-01-18 DIAGNOSIS — J302 Other seasonal allergic rhinitis: Secondary | ICD-10-CM | POA: Diagnosis present

## 2017-01-18 DIAGNOSIS — I252 Old myocardial infarction: Secondary | ICD-10-CM | POA: Diagnosis not present

## 2017-01-18 LAB — BASIC METABOLIC PANEL
ANION GAP: 9 (ref 5–15)
BUN: 30 mg/dL — AB (ref 6–20)
CO2: 28 mmol/L (ref 22–32)
Calcium: 8.7 mg/dL — ABNORMAL LOW (ref 8.9–10.3)
Chloride: 101 mmol/L (ref 101–111)
Creatinine, Ser: 1.23 mg/dL (ref 0.61–1.24)
GFR calc Af Amer: >60 mL/min (ref >60)
GFR, EST NON AFRICAN AMERICAN: 53 mL/min — AB (ref >60)
GLUCOSE: 143 mg/dL — AB (ref 65–99)
POTASSIUM: 4.4 mmol/L (ref 3.5–5.1)
Sodium: 138 mmol/L (ref 135–145)

## 2017-01-18 LAB — PROTIME-INR
INR: 1.94
PROTHROMBIN TIME: 22 s — AB (ref 11.4–15.2)

## 2017-01-18 LAB — ECHOCARDIOGRAM COMPLETE
Height: 72 in
Weight: 3435.65 oz

## 2017-01-18 NOTE — Progress Notes (Signed)
  Echocardiogram 2D Echocardiogram has been performed.  Roberto Reilly 01/18/2017, 2:56 PM

## 2017-01-18 NOTE — Progress Notes (Signed)
Patient ID: Roberto Reilly, male   DOB: 1933/06/25, 83 y.o.   MRN: [id number]    PROGRESS NOTE    Roberto Reilly  JYN:[id number] DOB: [birth year] DOA: 01/17/2017  PCP: Roberto Grippe, MD   Brief Narrative:  83 y.o. male with medical history significant of COPD on chronic 3 L oxygen, diastolic heart failure, CAD status post CABG, history of MI, paroxysmal atrial fibrillation on chronic Coumadin, hyperlipidemia, diverticulosis, AAA, presented with main concern of 10 days duration of progressively worsening dyspnea and orthopnea, wheezing and fatigue. At baseline, able to walk about 100 yards.   Assessment & Plan:    Acute on chronic resp failure with hypoxia, oxygen dependent, Acute diastolic heart failure, Acute COPD  - Last echocardiogram in June 2017 significant for an EF of 55% to 60% with no mention of diastolic dysfunction. BNP decreased from May.  - Initial improvement in symptoms with IV Lasix in the ED - also feels better this AM - continue Lasix IV BID - monitor daily weights, I/O - follow up on ECHO   COPD, acute - more wheezing this AM - keep on BD's scheduled and as needed   Paroxysmal atrial fibrillation. - coumadin per pharmacy  CKD 3 - overall stable   AAA - pt declined further work up   Essential hypertension - Norvasc held due to low BP - BP is more stable today in 120's - may be able to resume Norvasc in AM   CAD s/p CABG Hyperlipidemia - Continue atorvastatin. - Continue aspirin  Leg lesion, left - wound care team saw pt, follow up on rec's   DVT prophylaxis: Warfarin  Code Status: DNR Family Communication: Patient at bedside  Disposition Plan: to be determined   Consultants:   None  Procedures:   None   Antimicrobials:   None   Subjective: Pt reports feeling better but still with exertional dyspnea.   Objective: Vitals:   01/18/17 0754 01/18/17 0843 01/18/17 1103 01/18/17 1256  BP:    (!) 129/49  Pulse:  72 86 70  Resp:  20  20 20   Temp:    98.2 F (36.8 C)  TempSrc:    Oral  SpO2:  95% 96% 96%  Weight: 97.4 kg (214 lb 11.7 oz)     Height:        Intake/Output Summary (Last 24 hours) at 01/18/17 1623 Last data filed at 01/18/17 1256  Gross per 24 hour  Intake              720 ml  Output             2265 ml  Net            -1545 ml   Filed Weights   01/18/17 0754  Weight: 97.4 kg (214 lb 11.7 oz)    Examination:  General exam: Appears calm and comfortable  Respiratory system: Diffuse rhonchi and exp wheezing  Cardiovascular system: IRRR. No rubs, gallops or clicks. +1 bilateral LE pitting edema Gastrointestinal system: Abdomen is nondistended, soft and nontender. No organomegaly or masses felt. Normal bowel sounds heard. Central nervous system: Alert and oriented. No focal neurological deficits.  Data Reviewed: I have personally reviewed following labs and imaging studies  CBC:  Recent Labs Lab 01/17/17 1543  WBC 8.4  NEUTROABS 6.0  HGB 14.4  HCT 44.1  MCV 85.8  PLT 158   Basic Metabolic Panel:  Recent Labs Lab 01/17/17 1543 01/18/17 0353  NA 139 138  K 4.6  4.4  CL 104 101  CO2 28 28  GLUCOSE 97 143*  BUN 22* 30*  CREATININE 1.09 1.23  CALCIUM 8.8* 8.7*   Liver Function Tests:  Recent Labs Lab 01/17/17 1543  AST 24  ALT 14*  ALKPHOS 66  BILITOT 1.2  PROT 6.6  ALBUMIN 4.1   Coagulation Profile:  Recent Labs Lab 01/17/17 1545 01/18/17 0353  INR 2.09 1.94   Cardiac Enzymes:  Recent Labs Lab 01/17/17 1543  TROPONINI <0.03   Urine analysis:    Component Value Date/Time   COLORURINE YELLOW 09/26/2016 1206   APPEARANCEUR CLEAR 09/26/2016 1206   LABSPEC 1.010 09/26/2016 1206   PHURINE 5.0 09/26/2016 1206   GLUCOSEU NEGATIVE 09/26/2016 1206   HGBUR NEGATIVE 09/26/2016 1206   BILIRUBINUR NEGATIVE 09/26/2016 1206   KETONESUR NEGATIVE 09/26/2016 1206   PROTEINUR NEGATIVE 09/26/2016 1206   UROBILINOGEN 0.2 10/19/2010 1150   NITRITE NEGATIVE 09/26/2016  1206   LEUKOCYTESUR NEGATIVE 09/26/2016 1206   Radiology Studies: Dg Chest 2 View  Result Date: 01/17/2017 CLINICAL DATA:  Shortness of Breath EXAM: CHEST  2 VIEW COMPARISON:  09/26/2016 FINDINGS: Prior CABG. Cardiomegaly. Small to moderate right pleural effusion is stable. Right lower lobe atelectasis. Vascular congestion. No acute bony abnormality. IMPRESSION: Cardiomegaly with vascular congestion. Right pleural effusion with right base atelectasis. Findings unchanged. Electronically Signed   By: Charlett NoseKevin  Dover M.D.   On: 01/17/2017 13:15    Scheduled Meds: . aspirin EC  81 mg Oral QAC breakfast  . atorvastatin  40 mg Oral Daily  . cholecalciferol  1,000 Units Oral QAC breakfast  . furosemide  40 mg Intravenous BID  . ipratropium-albuterol  3 mL Nebulization QID  . mometasone-formoterol  2 puff Inhalation BID  . potassium chloride SA  20 mEq Oral Daily  . sodium chloride flush  3 mL Intravenous Q12H  . warfarin  5 mg Oral Q M,W,F-1800   And  . [START ON 01/19/2017] warfarin  2.5 mg Oral Q T,Th,S,Su-1800  . Warfarin - Pharmacist Dosing Inpatient   Does not apply q1800   Continuous Infusions: . sodium chloride       LOS: 0 days    Time spent: 25 minutes   Debbora PrestoIskra Magick-Myers, MD [facility] Pager [phone]  If 7PM-7AM, please contact night-coverage [email or web address] Password [email or web address] 01/18/2017, 4:23 PM

## 2017-01-18 NOTE — Evaluation (Signed)
Physical Therapy Evaluation Patient Details Name: Roberto Reilly MRN: [id number] DOB: 03/12/1934 Today's Date: 01/18/2017   History of Present Illness  83 yo male admitted with acute on chronic CHF. Hx of A fib, CAD, CABG, A fib, COPD, ETOH abuse, CHF, R foot transmet amputation.   Clinical Impression  On eval, pt was supervision-min guard level for mobility. He walked ~200 feet with a rollator and another 15 feet without an assistive device. O2 sat readings: 96% 3L O2 at rest, 88% 3L O2 during ambulation, 92% 3L O2 at end of session. Do not anticipate any follow up PT needs after discharge. Will follow during hospital stay. Recommend daily ambulation with nursing supervision as able.     Follow Up Recommendations No PT follow up;Supervision - Intermittent    Equipment Recommendations  None recommended by PT    Recommendations for Other Services       Precautions / Restrictions Precautions Precautions: Fall Precaution Comments: monitor O2 sats. O2 dep Restrictions Weight Bearing Restrictions: No      Mobility  Bed Mobility Overal bed mobility: Modified Independent                Transfers Overall transfer level: Modified independent                  Ambulation/Gait Ambulation/Gait assistance: Supervision;Min guard Ambulation Distance (Feet): 200 Feet (200'x1, 15'x1) Assistive device: 4-wheeled walker;None Gait Pattern/deviations: Step-through pattern;Decreased stride length     General Gait Details: Pt walked ~200 feet with a rollator-supervision. He also walked ~15 feet without a device-Min guard. No LOB. O2 sat 88% on 3L Oasis O2, dyspnea 2/4.   Stairs            Wheelchair Mobility    Modified Rankin (Stroke Patients Only)       Balance                                             Pertinent Vitals/Pain Pain Assessment: No/denies pain    Home Living Family/patient expects to be discharged to:: Private residence Living  Arrangements: Alone Available Help at Discharge: Family;Available PRN/intermittently Type of Home: Apartment Home Access: Level entry     Home Layout: One level Home Equipment: Cane - single point;Walker - 2 wheels      Prior Function Level of Independence: Independent with assistive device(s)         Comments: uses cane at baseline when outside of home.      Hand Dominance        Extremity/Trunk Assessment   Upper Extremity Assessment Upper Extremity Assessment: Overall WFL for tasks assessed    Lower Extremity Assessment Lower Extremity Assessment: Generalized weakness    Cervical / Trunk Assessment Cervical / Trunk Assessment: Normal  Communication   Communication: HOH  Cognition Arousal/Alertness: Awake/alert Behavior During Therapy: WFL for tasks assessed/performed Overall Cognitive Status: Within Functional Limits for tasks assessed                                        General Comments      Exercises     Assessment/Plan    PT Assessment Patient needs continued PT services  PT Problem List Decreased mobility;Decreased activity tolerance       PT Treatment Interventions Gait training;Therapeutic  activities;Therapeutic exercise;DME instruction;Patient/family education;Functional mobility training    PT Goals (Current goals can be found in the Care Plan section)  Acute Rehab PT Goals Patient Stated Goal: home PT Goal Formulation: With patient Time For Goal Achievement: 02/01/17 Potential to Achieve Goals: Good    Frequency Min 3X/week   Barriers to discharge        Co-evaluation               AM-PAC PT "6 Clicks" Daily Activity  Outcome Measure Difficulty turning over in bed (including adjusting bedclothes, sheets and blankets)?: None Difficulty moving from lying on back to sitting on the side of the bed? : None Difficulty sitting down on and standing up from a chair with arms (e.g., wheelchair, bedside commode,  etc,.)?: A Little Help needed moving to and from a bed to chair (including a wheelchair)?: A Little Help needed walking in hospital room?: A Little Help needed climbing 3-5 steps with a railing? : A Little 6 Click Score: 20    End of Session Equipment Utilized During Treatment: Oxygen Activity Tolerance: Patient tolerated treatment well Patient left: in chair;with call bell/phone within reach   PT Visit Diagnosis: Difficulty in walking, not elsewhere classified (R26.2)    Time: 7829-5621 PT Time Calculation (min) (ACUTE ONLY): 20 min   Charges:   PT Evaluation $PT Eval Low Complexity: 1 Low     PT G Codes:   PT G-Codes **NOT FOR INPATIENT CLASS** Functional Assessment Tool Used: AM-PAC 6 Clicks Basic Mobility;Clinical judgement Functional Limitation: Mobility: Walking and moving around Mobility: Walking and Moving Around Current Status (H0865): At least 1 percent but less than 20 percent impaired, limited or restricted Mobility: Walking and Moving Around Goal Status [phone]): At least 1 percent but less than 20 percent impaired, limited or restricted      Rebeca Alert, MPT Pager: [phone]

## 2017-01-18 NOTE — Progress Notes (Signed)
PT Cancellation Note  Patient Details Name: Roberto Reilly MRN: [id number] DOB: 08/01/1933   Cancelled Treatment:    Reason Eval/Treat Not Completed: Patient at procedure or test/unavailable . Will check back as schedule allows, either today or tomorrow.    Rebeca AlertJannie Lucca Greggs, MPT Pager: [phone]

## 2017-01-18 NOTE — Progress Notes (Signed)
Nutrition Brief Note  Patient identified on the Malnutrition Screening Tool (MST) Report  Pt denies any loss in appetite or unintentional wt loss. Spoke with pt about his meals at home. Pt consuming high sodium options daily. Reports eating SPAM and sausage twice a day. Receives meals on wheels for dinner. Pt refused any tips on decreasing sodium, states "I don't want to live that way, I'm 83 years old" despite knowing the repercussions for his breathing and overall health.   Wt Readings from Last 15 Encounters:  01/18/17 214 lb 11.7 oz (97.4 kg)  11/30/16 218 lb 9.6 oz (99.2 kg)  09/27/16 221 lb 1.9 oz (100.3 kg)  07/26/16 231 lb (104.8 kg)  06/02/16 219 lb 9.6 oz (99.6 kg)  05/22/16 221 lb 9 oz (100.5 kg)  04/26/16 218 lb (98.9 kg)  03/22/16 223 lb (101.2 kg)  02/05/16 212 lb (96.2 kg)  11/26/15 203 lb 12.8 oz (92.4 kg)  11/05/15 209 lb 14.1 oz (95.2 kg)  10/08/15 212 lb 7 oz (96.4 kg)  10/06/15 212 lb 7 oz (96.4 kg)  03/12/15 230 lb 3.2 oz (104.4 kg)  02/23/15 226 lb 8 oz (102.7 kg)    Body mass index is 29.12 kg/m. Patient meets criteria for Overweight based on current BMI.   Current diet order is Heart Healthy, patient is consuming approximately 100% of meals at this time. Labs and medications reviewed.   No nutrition interventions warranted at this time. If nutrition issues arise, please consult RD.   Vanessa Kickarly Verlisa Vara RD, LDN Clinical Nutrition Pager # [phone]

## 2017-01-18 NOTE — Consult Note (Signed)
WOC Nurse wound consult note Reason for Consult: Traumatic injury to left pretibial LE.  Patient scratched himself in the night "about a week ago" and removed a skin flap.  Prefers to not have a dressing or any kind of moist wound healing. Wound type:trauma Pressure Injury POA:N/A Measurement: 3.4cm x 1.2cm x 0.2cm Wound bed:red, dry with deflated and dried skin flap at proximal portion Drainage (amount, consistency, odor) None Periwound:intact with minimal erythema at periphery (<0.4cm) Dressing procedure/placement/frequency: Patient is adamant that no dressing be used and believes that moisture will not enhance healing, but impede it.  As this wound is not infected and I do not believe it detrimental to complete and eventual wound healing, I will ask Nursing to paint the wound with a betadine swabstick to provide an astringent, antimicrobial topical therapy. Patient is in agreement with this POC. WOC nursing team will not follow, but will remain available to this patient, the nursing and medical teams.  Please re-consult if needed. Thanks, Ladona MowLaurie Trishna Cwik, MSN, RN, GNP, Hans EdenCWOCN, CWON-AP, FAAN  Pager# [phone]

## 2017-01-19 LAB — BASIC METABOLIC PANEL
Anion gap: 8 (ref 5–15)
BUN: 34 mg/dL — AB (ref 6–20)
CHLORIDE: 101 mmol/L (ref 101–111)
CO2: 28 mmol/L (ref 22–32)
CREATININE: 1.21 mg/dL (ref 0.61–1.24)
Calcium: 8.5 mg/dL — ABNORMAL LOW (ref 8.9–10.3)
GFR calc Af Amer: >60 mL/min (ref >60)
GFR calc non Af Amer: 54 mL/min — ABNORMAL LOW (ref >60)
GLUCOSE: 99 mg/dL (ref 65–99)
Potassium: 4 mmol/L (ref 3.5–5.1)
SODIUM: 137 mmol/L (ref 135–145)

## 2017-01-19 LAB — CBC
HEMATOCRIT: 43.1 % (ref 39.0–52.0)
HEMOGLOBIN: 14.4 g/dL (ref 13.0–17.0)
MCH: 28 pg (ref 26.0–34.0)
MCHC: 33.4 g/dL (ref 30.0–36.0)
MCV: 83.7 fL (ref 78.0–100.0)
Platelets: 170 10*3/uL (ref 150–400)
RBC: 5.15 MIL/uL (ref 4.22–5.81)
RDW: 15.6 % — ABNORMAL HIGH (ref 11.5–15.5)
WBC: 10.9 10*3/uL — ABNORMAL HIGH (ref 4.0–10.5)

## 2017-01-19 LAB — PROTIME-INR
INR: 1.7
PROTHROMBIN TIME: 19.8 s — AB (ref 11.4–15.2)

## 2017-01-19 MED ORDER — IPRATROPIUM-ALBUTEROL 0.5-2.5 (3) MG/3ML IN SOLN
3.0000 mL | Freq: Three times a day (TID) | RESPIRATORY_TRACT | Status: DC
  Administered 2017-01-19 – 2017-01-21 (×7): 3 mL via RESPIRATORY_TRACT
  Filled 2017-01-19 (×7): qty 3

## 2017-01-19 MED ORDER — FUROSEMIDE 10 MG/ML IJ SOLN
40.0000 mg | Freq: Two times a day (BID) | INTRAMUSCULAR | Status: DC
Start: 2017-01-19 — End: 2017-01-20
  Administered 2017-01-19 – 2017-01-20 (×2): 40 mg via INTRAVENOUS
  Filled 2017-01-19 (×2): qty 4

## 2017-01-19 MED ORDER — WARFARIN SODIUM 5 MG PO TABS: 5.0000 mg | ORAL_TABLET | Freq: Once | ORAL | Status: AC

## 2017-01-19 NOTE — Progress Notes (Signed)
Patient ID: Roberto Reilly Caine, male   DOB: 03/15/1934, 83 y.o.   MRN: [id number]    PROGRESS NOTE  Roberto Reilly Dor  WUJ:[id number] DOB: 01/16/1934 DOA: 01/17/2017  PCP: Pearson GrippeKim, William, MD   Brief Narrative:  83 y.o. male with medical history significant of COPD on chronic 3 L oxygen, diastolic heart failure, CAD status post CABG, history of MI, paroxysmal atrial fibrillation on chronic Coumadin, hyperlipidemia, diverticulosis, AAA, presented with main concern of 10 days duration of progressively worsening dyspnea and orthopnea, wheezing and fatigue. At baseline, able to walk about 100 yards.   Assessment & Plan:    Acute on chronic resp failure with hypoxia, oxygen dependent, Acute diastolic heart failure, Acute COPD  - Last echocardiogram in June 2017 significant for an EF of 55% to 60% with no mention of diastolic dysfunction. BNP decreased from May.  - Initial improvement in symptoms with IV Lasix in the ED - also feels better this AM - continue lasix IV BID - weight trend since admission: Filed Weights   01/18/17 0754 01/19/17 0500  Weight: 97.4 kg (214 lb 11.7 oz) 95.1 kg (209 lb 10.5 oz)  - ECHO pending - follow daily weights, I/O  COPD, acute - better air movement this AM - continue BD's as needed   Paroxysmal atrial fibrillation. - coumadin per pharmacy  CKD 3 - stable   AAA - pt declined further work up   Essential hypertension - Norvasc held due to low BP - BP stable this AM   CAD s/p CABG Hyperlipidemia - Continue atorvastatin. - Continue aspirin  Leg lesion, left - wound care team saw pt, follow up on rec's  DVT prophylaxis: Warfarin  Code Status: DNR Family Communication: Patient at bedside  Disposition Plan: in 1-2 days will go home  Consultants:   None  Procedures:   None   Antimicrobials:   None   Subjective: Pt reports feeling better.   Objective: Vitals:   01/18/17 2243 01/19/17 0438 01/19/17 0500 01/19/17 0906  BP: (!) 128/50 (!)  128/56    Pulse: 76 65    Resp: 20 20    Temp: 98.7 F (37.1 C) 98.7 F (37.1 C)    TempSrc: Oral Oral    SpO2: 97% 98%  94%  Weight:   95.1 kg (209 lb 10.5 oz)   Height:        Intake/Output Summary (Last 24 hours) at 01/19/17 1312 Last data filed at 01/19/17 1100  Gross per 24 hour  Intake              480 ml  Output             3175 ml  Net            -2695 ml   Filed Weights   01/18/17 0754 01/19/17 0500  Weight: 97.4 kg (214 lb 11.7 oz) 95.1 kg (209 lb 10.5 oz)    Physical Exam  Constitutional: Appears well-developed and well-nourished. No distress.  CVS: RRR, S1/S2 +, no murmurs, no gallops, no carotid bruit.  Pulmonary: bilateral rhonchi and crackles at bases, less wheezing  Abdominal: Soft. BS +,  no distension, tenderness, rebound or guarding.  Musculoskeletal: Normal range of motion. Trace bilateral LE pitting edema   Data Reviewed: I have personally reviewed following labs and imaging studies  CBC:  Recent Labs Lab 01/17/17 1543 01/19/17 0408  WBC 8.4 10.9*  NEUTROABS 6.0  --   HGB 14.4 14.4  HCT 44.1 43.1  MCV 85.8  83.7  PLT 158 170   Basic Metabolic Panel:  Recent Labs Lab 01/17/17 1543 01/18/17 0353 01/19/17 0408  NA 139 138 137  K 4.6 4.4 4.0  CL 104 101 101  CO2 28 28 28   GLUCOSE 97 143* 99  BUN 22* 30* 34*  CREATININE 1.09 1.23 1.21  CALCIUM 8.8* 8.7* 8.5*   Liver Function Tests:  Recent Labs Lab 01/17/17 1543  AST 24  ALT 14*  ALKPHOS 66  BILITOT 1.2  PROT 6.6  ALBUMIN 4.1   Coagulation Profile:  Recent Labs Lab 01/17/17 1545 01/18/17 0353 01/19/17 0408  INR 2.09 1.94 1.70   Cardiac Enzymes:  Recent Labs Lab 01/17/17 1543  TROPONINI <0.03   Urine analysis:    Component Value Date/Time   COLORURINE YELLOW 09/26/2016 1206   APPEARANCEUR CLEAR 09/26/2016 1206   LABSPEC 1.010 09/26/2016 1206   PHURINE 5.0 09/26/2016 1206   GLUCOSEU NEGATIVE 09/26/2016 1206   HGBUR NEGATIVE 09/26/2016 1206   BILIRUBINUR  NEGATIVE 09/26/2016 1206   KETONESUR NEGATIVE 09/26/2016 1206   PROTEINUR NEGATIVE 09/26/2016 1206   UROBILINOGEN 0.2 10/19/2010 1150   NITRITE NEGATIVE 09/26/2016 1206   LEUKOCYTESUR NEGATIVE 09/26/2016 1206   Radiology Studies: Dg Chest 2 View  Result Date: 01/17/2017 CLINICAL DATA:  Shortness of Breath EXAM: CHEST  2 VIEW COMPARISON:  09/26/2016 FINDINGS: Prior CABG. Cardiomegaly. Small to moderate right pleural effusion is stable. Right lower lobe atelectasis. Vascular congestion. No acute bony abnormality. IMPRESSION: Cardiomegaly with vascular congestion. Right pleural effusion with right base atelectasis. Findings unchanged. Electronically Signed   By: Charlett Nose M.D.   On: 01/17/2017 13:15    Scheduled Meds: . aspirin EC  81 mg Oral QAC breakfast  . atorvastatin  40 mg Oral Daily  . cholecalciferol  1,000 Units Oral QAC breakfast  . furosemide  40 mg Intravenous BID  . ipratropium-albuterol  3 mL Nebulization TID  . mometasone-formoterol  2 puff Inhalation BID  . potassium chloride SA  20 mEq Oral Daily  . sodium chloride flush  3 mL Intravenous Q12H  . warfarin  5 mg Oral ONCE-1800  . Warfarin - Pharmacist Dosing Inpatient   Does not apply q1800   Continuous Infusions: . sodium chloride       LOS: 1 day   Time spent: 25 minutes  Debbora Presto, MD [facility] Pager [phone]  If 7PM-7AM, please contact night-coverage [email or web address] Password [email or web address] 01/19/2017, 1:12 PM

## 2017-01-19 NOTE — Progress Notes (Signed)
ANTICOAGULATION CONSULT NOTE - Follow- Up Pharmacy Consult for coumadin Indication: atrial fibrillation  Allergies  Allergen Reactions  . Prednisone Other (See Comments)    Makes pt jittery and nervous    Patient Measurements: Height: 6' (182.9 cm) Weight: 209 lb 10.5 oz (95.1 kg) IBW/kg (Calculated) : 77.6  Vital Signs: Temp: 98.7 F (37.1 C) (08/30 0438) Temp Source: Oral (08/30 0438) BP: 128/56 (08/30 0438) Pulse Rate: 65 (08/30 0438)  Labs:  Recent Labs  01/17/17 1543 01/17/17 1545 01/18/17 0353 01/19/17 0408  HGB 14.4  --   --  14.4  HCT 44.1  --   --  43.1  PLT 158  --   --  170  LABPROT  --  23.3* 22.0* 19.8*  INR  --  2.09 1.94 1.70  CREATININE 1.09  --  1.23 1.21  TROPONINI <0.03  --   --   --     Estimated Creatinine Clearance: 57.3 mL/min (by C-G formula based on SCr of 1.21 mg/dL).   Medical History: Past Medical History:  Diagnosis Date  . AAA (abdominal aortic aneurysm) (HCC)    4.2 X 4.8 cm 04/2013 CT; declined re-eval 02/25/15  . Anxiety   . CAD in native artery March 2007   Cath for dyspnea on exertion: 75% distal LM, 85% RI, 95% mid-distal Cx, in multiple RCA lesions with 95% distal. --> Referred for CABG; has declined further noninvasive evaluation in the absence of worsening symptoms  . COPD (chronic obstructive pulmonary disease) with emphysema (HCC) 05/23/2007   Hosp 5/29-6/01/12- COPD exacerbation ONOX 12/08/10- desaturated to less than 88% for over an hour, qualifying for home O2 during sleep    . Diverticulosis of colon with hemorrhage 05/24/2013  . Dyslipidemia, goal LDL below 70   . H/O echocardiogram March ; January 2015   a) Normal LV size and cavity appeared normal function. Grade 1 diastolic dysfunction. Limited study.; b) normal LV size and function with EF 60-65%. Aortic sclerosis without stenosis, mildly increased PA pressures of but normal IVC and RA/RV size  . History of non-ST Elevation MI (myocardial infarction) March  2007   Admitted with COPD exacerbation, given by CHF. Had some chest pain with positive troponins. Cath showed multivessel disease, referred for CABG  . History of pneumonia   . Hypertension   . Hypoxia     chronic, on home O2  . Myocardial infarction (HCC)   . Obesity (BMI 30-39.9)    One year ago weighed 265 pounds, (12/2012) -- now 234 pounds  . PAF (paroxysmal atrial fibrillation) (HCC)    Anticoagulated on warfarin. Stable -- post op  . Pneumonia    hx  . 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 Seasonal allergies   . Shortness of breath dyspnea     Medications:  Prescriptions Prior to Admission  Medication Sig Dispense Refill Last Dose  . amLODipine (NORVASC) 10 MG tablet Take 1 tablet (10 mg total) by mouth daily. 30 tablet 1 01/17/2017 at Unknown time  . aspirin EC 81 MG tablet Take 81 mg by mouth every morning.    01/17/2017 at Unknown time  . atorvastatin (LIPITOR) 40 MG tablet Take 1 tablet (40 mg total) by mouth daily. 90 tablet 3 01/17/2017 at Unknown time  . budesonide-formoterol (SYMBICORT) 160-4.5 MCG/ACT inhaler Inhale 2 puffs into the lungs 2 (two) times daily. 2 Inhaler 0 01/17/2017 at Unknown time  . cholecalciferol (VITAMIN D) 1000 UNITS tablet  Take 1,000 Units by mouth every morning.    01/17/2017 at Unknown time  . furosemide (LASIX) 40 MG tablet Take 1 tablet (40 mg total) by mouth 2 (two) times daily. 90 tablet 1 01/17/2017 at Unknown time  . ipratropium-albuterol (DUONEB) 0.5-2.5 (3) MG/3ML SOLN Take 3 mLs by nebulization every 6 (six) hours as needed (wheezing and shortness of breath).    unknown  . OXYGEN Inhale 3 L into the lungs as needed (shortness of breath).    unknown  . potassium chloride SA (K-DUR,KLOR-CON) 20 MEQ tablet TAKE 1 TABLET (20 MEQ TOTAL) BY MOUTH DAILY. 30 tablet 8 01/17/2017 at Unknown time  . warfarin (COUMADIN) 2.5 MG tablet TAKE 1-2 TABLETS BY MOUTH DAILY AS DIRECTED BY COUMADIN CLININC (Patient taking differently: TAKE  1 tablet daily except for MWF takes two tablets.) 45 tablet 2 01/17/2017 at 0800    Assessment: 83 yo M on coumadin PTA for afib.  Per coumadin clinic his last INR 11/22/16 2.1 on 2.5 mg qday x 5 mg MWF last dose 8/28 at 08 am.  Hg 14.4, pltc 158. No bleeding reported.  Pt has taken his coumadin for today.  INR on admission is therapeutic at 2.09.  Today, 01/19/17   INR 1.70, subtherapeutic while on pt home regimen   Hgb 14.4, plt 170, WNL   No bleeding issues noted  Heart Healthy Diet   No Drug-Drug Interactions    Goal of Therapy:  INR 2-3 Monitor platelets by anticoagulation protocol: Yes   Plan:   Warfarin 5 mg PO x1   Daily INR  Monitor for signs and symptoms of bleeding   Adalberto ColeNikola Ranier Coach, PharmD, BCPS Pager [phone] 01/19/2017 8:44 AM

## 2017-01-20 LAB — PROTIME-INR
INR: 1.76
PROTHROMBIN TIME: 20.3 s — AB (ref 11.4–15.2)

## 2017-01-20 LAB — BASIC METABOLIC PANEL
Anion gap: 6 (ref 5–15)
BUN: 41 mg/dL — AB (ref 6–20)
CALCIUM: 8.4 mg/dL — AB (ref 8.9–10.3)
CO2: 31 mmol/L (ref 22–32)
CREATININE: 1.46 mg/dL — AB (ref 0.61–1.24)
Chloride: 101 mmol/L (ref 101–111)
GFR calc Af Amer: 50 mL/min — ABNORMAL LOW (ref >60)
GFR, EST NON AFRICAN AMERICAN: 43 mL/min — AB (ref >60)
GLUCOSE: 87 mg/dL (ref 65–99)
POTASSIUM: 4.9 mmol/L (ref 3.5–5.1)
SODIUM: 138 mmol/L (ref 135–145)

## 2017-01-20 LAB — CBC
HCT: 44.8 % (ref 39.0–52.0)
Hemoglobin: 14.5 g/dL (ref 13.0–17.0)
MCH: 27.7 pg (ref 26.0–34.0)
MCHC: 32.4 g/dL (ref 30.0–36.0)
MCV: 85.7 fL (ref 78.0–100.0)
PLATELETS: 159 10*3/uL (ref 150–400)
RBC: 5.23 MIL/uL (ref 4.22–5.81)
RDW: 15.8 % — ABNORMAL HIGH (ref 11.5–15.5)
WBC: 8.2 10*3/uL (ref 4.0–10.5)

## 2017-01-20 MED ORDER — WARFARIN SODIUM 5 MG PO TABS
5.0000 mg | ORAL_TABLET | Freq: Once | ORAL | Status: AC
  Administered 2017-01-20: 5 mg via ORAL
  Filled 2017-01-20: qty 1

## 2017-01-20 MED ORDER — MAGNESIUM HYDROXIDE 400 MG/5ML PO SUSP
15.0000 mL | Freq: Every day | ORAL | Status: DC | PRN
  Administered 2017-01-20: 15 mL via ORAL
  Filled 2017-01-20: qty 30

## 2017-01-20 MED ORDER — FUROSEMIDE 40 MG PO TABS
40.0000 mg | ORAL_TABLET | Freq: Every day | ORAL | Status: DC
  Administered 2017-01-20 – 2017-01-21 (×2): 40 mg via ORAL
  Filled 2017-01-20: qty 1

## 2017-01-20 NOTE — Progress Notes (Signed)
Physical Therapy Treatment Patient Details Name: Roberto Reilly MRN: [id number] DOB: 06/02/1933 Today's Date: 01/20/2017    History of Present Illness 83 yo male admitted with acute on chronic CHF. Hx of A fib, CAD, CABG, A fib, COPD, ETOH abuse, CHF, R foot transmet amputation.     PT Comments    Pt continues to participate well with therapy.    Follow Up Recommendations  No PT follow up;Supervision - Intermittent     Equipment Recommendations       Recommendations for Other Services       Precautions / Restrictions Precautions Precautions: Fall Precaution Comments: monitor O2 sats. O2 dep Restrictions Weight Bearing Restrictions: No    Mobility  Bed Mobility               General bed mobility comments: oob in recliner  Transfers Overall transfer level: Modified independent Equipment used: None                Ambulation/Gait Ambulation/Gait assistance: Min assist;Min guard Ambulation Distance (Feet): 200 Feet Assistive device: Straight cane Gait Pattern/deviations: Step-through pattern;Decreased stride length     General Gait Details: Intermittent assist given for unsteadiness. Pt used cane on today. Cues for pacing. O2 sat 88% on 3L [place] O2, dyspnea 2/4.    Stairs            Wheelchair Mobility    Modified Rankin (Stroke Patients Only)       Balance Overall balance assessment: Needs assistance           Standing balance-Leahy Scale: Fair                              Cognition Arousal/Alertness: Awake/alert Behavior During Therapy: WFL for tasks assessed/performed Overall Cognitive Status: Within Functional Limits for tasks assessed                                        Exercises      General Comments        Pertinent Vitals/Pain Pain Assessment: Faces Faces Pain Scale: Hurts little more Pain Location: bil feet Pain Descriptors / Indicators: Sore Pain Intervention(s): Monitored during  session    Home Living                      Prior Function            PT Goals (current goals can now be found in the care plan section) Progress towards PT goals: Progressing toward goals    Frequency    Min 3X/week      PT Plan Current plan remains appropriate    Co-evaluation              AM-PAC PT "6 Clicks" Daily Activity  Outcome Measure  Difficulty turning over in bed (including adjusting bedclothes, sheets and blankets)?: None Difficulty moving from lying on back to sitting on the side of the bed? : None Difficulty sitting down on and standing up from a chair with arms (e.g., wheelchair, bedside commode, etc,.)?: None Help needed moving to and from a bed to chair (including a wheelchair)?: A Little Help needed walking in hospital room?: A Little Help needed climbing 3-5 steps with a railing? : A Little 6 Click Score: 21    End of Session Equipment Utilized During Treatment: Oxygen Activity  Tolerance: Patient tolerated treatment well Patient left: in chair;with call bell/phone within reach         Time: 1041-1049 PT Time Calculation (min) (ACUTE ONLY): 8 min  Charges:  $Gait Training: 8-22 mins                    G Codes:          Rebeca Alert, MPT Pager: [phone]

## 2017-01-20 NOTE — Progress Notes (Addendum)
Patient ID: Roberto Reilly, male   DOB: [birth year], 83 y.o.   MRN: [id number]    PROGRESS NOTE  Roberto Reilly  WUJ:[id number] DOB: [birth year] DOA: 01/17/2017  PCP: Pearson Grippe, MD   Brief Narrative:  83 y.o. male with medical history significant of COPD on chronic 3 L oxygen, diastolic heart failure, CAD status post CABG, history of MI, paroxysmal atrial fibrillation on chronic Coumadin, hyperlipidemia, diverticulosis, AAA, presented with main concern of 10 days duration of progressively worsening dyspnea and orthopnea, wheezing and fatigue. At baseline, able to walk about 100 yards.   Assessment & Plan:  Acute on chronic resp failure with hypoxia, oxygen dependent, Acute diastolic heart failure, Acute COPD  - Last echocardiogram in June 2017 significant for an EF of 55% to 60% with no mention of diastolic dysfunction. BNP decreased from May.  - pt reports continued improvement  - pt has been on lasix IV BID, will plan to change to PO today  - weight trend since admission: Filed Weights   01/18/17 0754 01/19/17 0500 01/20/17 0445  Weight: 97.4 kg (214 lb 11.7 oz) 95.1 kg (209 lb 10.5 oz) 95.3 kg (210 lb 1.6 oz)  - ECHO with stable EF 55 - 60% - follow daily weights, I/O  COPD, acute - continue BD's scheduled and as needed   Paroxysmal atrial fibrillation. - coumadin per pharmacy   CKD 3 - Cr is up this AM, plan to change lasix to PO - BMP in AM  AAA - pt declined further work up   Essential hypertension - Norvasc held due to low BP - BP remains stable so far   CAD s/p CABG Hyperlipidemia - Continue atorvastatin. - Continue aspirin  Leg lesion, left - wound care team saw pt, follow up on rec's  DVT prophylaxis: Warfarin  Code Status: DNR Family Communication: Patient at bedside  Disposition Plan: home in am if continues to improve on PO Lasix   Consultants:   None  Procedures:   None   Antimicrobials:   None   Subjective: Pt reports feeling better.     Objective: Vitals:   01/19/17 1957 01/19/17 2241 01/20/17 0445 01/20/17 0803  BP:  127/73 (!) 105/54   Pulse:  79    Resp:  18 18   Temp:  98 F (36.7 C) 98 F (36.7 C)   TempSrc:  Oral Oral   SpO2: 96% 98% 95% 95%  Weight:   95.3 kg (210 lb 1.6 oz)   Height:        Intake/Output Summary (Last 24 hours) at 01/20/17 0953 Last data filed at 01/20/17 0924  Gross per 24 hour  Intake              480 ml  Output             2450 ml  Net            -1970 ml   Filed Weights   01/18/17 0754 01/19/17 0500 01/20/17 0445  Weight: 97.4 kg (214 lb 11.7 oz) 95.1 kg (209 lb 10.5 oz) 95.3 kg (210 lb 1.6 oz)    Physical Exam  Constitutional: Appears calm, NAD CVS: RRR, S1/S2 +, no murmurs, no gallops, no carotid bruit.  Pulmonary: Mild exp wheezing with mild crackles at bases  Abdominal: Soft. BS +,  no distension, tenderness, rebound or guarding.  Musculoskeletal: Normal range of motion. Trace bilateral LE pitting edema   Data Reviewed: I have personally reviewed following labs and imaging studies  CBC:  Recent Labs Lab 01/17/17 1543 01/19/17 0408 01/20/17 0414  WBC 8.4 10.9* 8.2  NEUTROABS 6.0  --   --   HGB 14.4 14.4 14.5  HCT 44.1 43.1 44.8  MCV 85.8 83.7 85.7  PLT 158 170 159   Basic Metabolic Panel:  Recent Labs Lab 01/17/17 1543 01/18/17 0353 01/19/17 0408 01/20/17 0414  NA 139 138 137 138  K 4.6 4.4 4.0 4.9  CL 104 101 101 101  CO2 28 28 28 31   GLUCOSE 97 143* 99 87  BUN 22* 30* 34* 41*  CREATININE 1.09 1.23 1.21 1.46*  CALCIUM 8.8* 8.7* 8.5* 8.4*   Liver Function Tests:  Recent Labs Lab 01/17/17 1543  AST 24  ALT 14*  ALKPHOS 66  BILITOT 1.2  PROT 6.6  ALBUMIN 4.1   Coagulation Profile:  Recent Labs Lab 01/17/17 1545 01/18/17 0353 01/19/17 0408 01/20/17 0414  INR 2.09 1.94 1.70 1.76   Cardiac Enzymes:  Recent Labs Lab 01/17/17 1543  TROPONINI <0.03   Urine analysis:    Component Value Date/Time   COLORURINE YELLOW  09/26/2016 1206   APPEARANCEUR CLEAR 09/26/2016 1206   LABSPEC 1.010 09/26/2016 1206   PHURINE 5.0 09/26/2016 1206   GLUCOSEU NEGATIVE 09/26/2016 1206   HGBUR NEGATIVE 09/26/2016 1206   BILIRUBINUR NEGATIVE 09/26/2016 1206   KETONESUR NEGATIVE 09/26/2016 1206   PROTEINUR NEGATIVE 09/26/2016 1206   UROBILINOGEN 0.2 10/19/2010 1150   NITRITE NEGATIVE 09/26/2016 1206   LEUKOCYTESUR NEGATIVE 09/26/2016 1206   Radiology Studies: No results found.  Scheduled Meds: . aspirin EC  81 mg Oral QAC breakfast  . atorvastatin  40 mg Oral Daily  . cholecalciferol  1,000 Units Oral QAC breakfast  . furosemide  40 mg Intravenous BID  . ipratropium-albuterol  3 mL Nebulization TID  . mometasone-formoterol  2 puff Inhalation BID  . potassium chloride SA  20 mEq Oral Daily  . sodium chloride flush  3 mL Intravenous Q12H  . warfarin  5 mg Oral ONCE-1800  . Warfarin - Pharmacist Dosing Inpatient   Does not apply q1800   Continuous Infusions: . sodium chloride       LOS: 2 days   Time spent: 25 minutes   Debbora PrestoIskra Magick-Akili Corsetti, MD [facility] Pager [phone]  If 7PM-7AM, please contact night-coverage [email or web address] Password [email or web address] 01/20/2017, 9:53 AM

## 2017-01-20 NOTE — Progress Notes (Signed)
ANTICOAGULATION CONSULT NOTE - Follow- Up Pharmacy Consult for coumadin Indication: atrial fibrillation  Allergies  Allergen Reactions  . Prednisone Other (See Comments)    Makes pt jittery and nervous    Patient Measurements: Height: 6' (182.9 cm) Weight: 210 lb 1.6 oz (95.3 kg) IBW/kg (Calculated) : 77.6  Vital Signs: Temp: 98 F (36.7 C) (08/31 0445) Temp Source: Oral (08/31 0445) BP: 105/54 (08/31 0445) Pulse Rate: 79 (08/30 2241)  Labs:  Recent Labs  01/17/17 1543  01/18/17 0353 01/19/17 0408 01/20/17 0414  HGB 14.4  --   --  14.4 14.5  HCT 44.1  --   --  43.1 44.8  PLT 158  --   --  170 159  LABPROT  --   < > 22.0* 19.8* 20.3*  INR  --   < > 1.94 1.70 1.76  CREATININE 1.09  --  1.23 1.21 1.46*  TROPONINI <0.03  --   --   --   --   < > = values in this interval not displayed.  Estimated Creatinine Clearance: 47.5 mL/min (A) (by C-G formula based on SCr of 1.46 mg/dL (H)).   Medical History: Past Medical History:  Diagnosis Date  . AAA (abdominal aortic aneurysm) (HCC)    4.2 X 4.8 cm 04/2013 CT; declined re-eval 02/25/15  . Anxiety   . CAD in native artery March 2007   Cath for dyspnea on exertion: 75% distal LM, 85% RI, 95% mid-distal Cx, in multiple RCA lesions with 95% distal. --> Referred for CABG; has declined further noninvasive evaluation in the absence of worsening symptoms  . COPD (chronic obstructive pulmonary disease) with emphysema (HCC) 05/23/2007   Hosp 5/29-6/01/12- COPD exacerbation ONOX 12/08/10- desaturated to less than 88% for over an hour, qualifying for home O2 during sleep    . Diverticulosis of colon with hemorrhage 05/24/2013  . Dyslipidemia, goal LDL below 70   . H/O echocardiogram March ; January 2015   a) Normal LV size and cavity appeared normal function. Grade 1 diastolic dysfunction. Limited study.; b) normal LV size and function with EF 60-65%. Aortic sclerosis without stenosis, mildly increased PA pressures of 44mmHg but normal  IVC and RA/RV size  . History of non-ST Elevation MI (myocardial infarction) March 2007   Admitted with COPD exacerbation, given by CHF. Had some chest pain with positive troponins. Cath showed multivessel disease, referred for CABG  . History of pneumonia   . Hypertension   . Hypoxia     chronic, on home O2  . Myocardial infarction (HCC)   . Obesity (BMI 30-39.9)    One year ago weighed 265 pounds, (12/2012) -- now 234 pounds  . PAF (paroxysmal atrial fibrillation) (HCC)    Anticoagulated on warfarin. Stable -- post op  . Pneumonia    hx  . 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 Seasonal allergies   . Shortness of breath dyspnea     Medications:  Prescriptions Prior to Admission  Medication Sig Dispense Refill Last Dose  . amLODipine (NORVASC) 10 MG tablet Take 1 tablet (10 mg total) by mouth daily. 30 tablet 1 01/17/2017 at Unknown time  . aspirin EC 81 MG tablet Take 81 mg by mouth every morning.    01/17/2017 at Unknown time  . atorvastatin (LIPITOR) 40 MG tablet Take 1 tablet (40 mg total) by mouth daily. 90 tablet 3 01/17/2017 at Unknown time  . budesonide-formoterol (SYMBICORT) 160-4.5 MCG/ACT inhaler Inhale  2 puffs into the lungs 2 (two) times daily. 2 Inhaler 0 01/17/2017 at Unknown time  . cholecalciferol (VITAMIN D) 1000 UNITS tablet Take 1,000 Units by mouth every morning.    01/17/2017 at Unknown time  . furosemide (LASIX) 40 MG tablet Take 1 tablet (40 mg total) by mouth 2 (two) times daily. 90 tablet 1 01/17/2017 at Unknown time  . ipratropium-albuterol (DUONEB) 0.5-2.5 (3) MG/3ML SOLN Take 3 mLs by nebulization every 6 (six) hours as needed (wheezing and shortness of breath).    unknown  . OXYGEN Inhale 3 L into the lungs as needed (shortness of breath).    unknown  . potassium chloride SA (K-DUR,KLOR-CON) 20 MEQ tablet TAKE 1 TABLET (20 MEQ TOTAL) BY MOUTH DAILY. 30 tablet 8 01/17/2017 at Unknown time  . warfarin (COUMADIN) 2.5 MG tablet TAKE 1-2  TABLETS BY MOUTH DAILY AS DIRECTED BY COUMADIN CLININC (Patient taking differently: TAKE 1 tablet daily except for MWF takes two tablets.) 45 tablet 2 01/17/2017 at 0800    Assessment: 83 yo M on coumadin PTA for afib.  Per coumadin clinic his last INR 11/22/16 2.1 on 2.5 mg qday x 5 mg MWF last dose 8/28 at 08 am.  Hg 14.4, pltc 158. No bleeding reported.  Pt has taken his coumadin for today.  INR on admission is therapeutic at 2.09.  Today, 01/20/17   INR 1.76, subtherapeutic but slightly up after after 5 mg dose   Hgb 14.5, plt 159, WNL   No bleeding issues noted  Heart Healthy Diet   No Drug-Drug Interactions    Goal of Therapy:  INR 2-3 Monitor platelets by anticoagulation protocol: Yes   Plan:   Warfarin 5 mg PO x1   Daily INR  Monitor for signs and symptoms of bleeding   Adalberto Cole, PharmD, BCPS Pager [phone] 01/20/2017 10:17 AM

## 2017-01-21 LAB — BASIC METABOLIC PANEL
Anion gap: 7 (ref 5–15)
BUN: 32 mg/dL — ABNORMAL HIGH (ref 6–20)
CO2: 28 mmol/L (ref 22–32)
Calcium: 8.4 mg/dL — ABNORMAL LOW (ref 8.9–10.3)
Chloride: 101 mmol/L (ref 101–111)
Creatinine, Ser: 1.31 mg/dL — ABNORMAL HIGH (ref 0.61–1.24)
GFR calc Af Amer: 57 mL/min — ABNORMAL LOW (ref >60)
GFR calc non Af Amer: 49 mL/min — ABNORMAL LOW (ref >60)
Glucose, Bld: 95 mg/dL (ref 65–99)
Potassium: 4.3 mmol/L (ref 3.5–5.1)
Sodium: 136 mmol/L (ref 135–145)

## 2017-01-21 LAB — CBC
HCT: 46 % (ref 39.0–52.0)
Hemoglobin: 15 g/dL (ref 13.0–17.0)
MCH: 27.9 pg (ref 26.0–34.0)
MCHC: 32.6 g/dL (ref 30.0–36.0)
MCV: 85.5 fL (ref 78.0–100.0)
PLATELETS: 164 10*3/uL (ref 150–400)
RBC: 5.38 MIL/uL (ref 4.22–5.81)
RDW: 15.5 % (ref 11.5–15.5)
WBC: 10.5 10*3/uL (ref 4.0–10.5)

## 2017-01-21 LAB — PROTIME-INR
INR: 1.7
Prothrombin Time: 19.8 seconds — ABNORMAL HIGH (ref 11.4–15.2)

## 2017-01-21 MED ORDER — WARFARIN SODIUM 5 MG PO TABS: 5.0000 mg | ORAL_TABLET | Freq: Once | ORAL | Status: DC

## 2017-01-21 NOTE — Care Management Note (Signed)
Case Management Note  Patient Details  Name: [name] MRN: [id number] Date of Birth: 04/21/1934  Subjective/Objective:     CHF, chronic anticoagulation               Action/Plan: Discharge Planning: Spoke to pt and dtr at bedside. Pt lives at home alone. No dc needs identified. Dtr assist as needed. Pt has oxygen at home. Uses a cane.  PCP Pearson GrippeKIM, [place] MD  Expected Discharge Date:  01/21/17               Expected Discharge Plan:  Home/Self Care  In-House Referral:  NA  Discharge planning Services  CM Consult  Post Acute Care Choice:  NA Choice offered to:  Patient  DME Arranged:  N/A DME Agency:  NA  HH Arranged:  NA HH Agency:  NA  Status of Service:  Completed, signed off  If discussed at Long Length of Stay Meetings, dates discussed:    Additional Comments:  Elliot CousinShavis, Petrona Wyeth Ellen, RN 01/21/2017, 3:18 PM

## 2017-01-21 NOTE — Progress Notes (Signed)
ANTICOAGULATION CONSULT NOTE - Follow- Up Pharmacy Consult for coumadin Indication: atrial fibrillation  Allergies  Allergen Reactions  . Prednisone Other (See Comments)    Makes pt jittery and nervous    Patient Measurements: Height: 6' (182.9 cm) Weight: 213 lb 3.2 oz (96.7 kg) IBW/kg (Calculated) : 77.6  Vital Signs: Temp: 97.7 F (36.5 C) (09/01 0438) Temp Source: Oral (09/01 0438) BP: 116/71 (09/01 0438) Pulse Rate: 76 (09/01 0823)  Labs:  Recent Labs  01/19/17 0408 01/20/17 0414 01/21/17 0456  HGB 14.4 14.5 15.0  HCT 43.1 44.8 46.0  PLT 170 159 164  LABPROT 19.8* 20.3* 19.8*  INR 1.70 1.76 1.70  CREATININE 1.21 1.46* 1.31*    Estimated Creatinine Clearance: 53.3 mL/min (A) (by C-G formula based on SCr of 1.31 mg/dL (H)).   Medical History: Past Medical History:  Diagnosis Date  . AAA (abdominal aortic aneurysm) (HCC)    4.2 X 4.8 cm 04/2013 CT; declined re-eval 02/25/15  . Anxiety   . CAD in native artery March 2007   Cath for dyspnea on exertion: 75% distal LM, 85% RI, 95% mid-distal Cx, in multiple RCA lesions with 95% distal. --> Referred for CABG; has declined further noninvasive evaluation in the absence of worsening symptoms  . COPD (chronic obstructive pulmonary disease) with emphysema (HCC) 05/23/2007   Hosp 5/29-6/01/12- COPD exacerbation ONOX 12/08/10- desaturated to less than 88% for over an hour, qualifying for home O2 during sleep    . Diverticulosis of colon with hemorrhage 05/24/2013  . Dyslipidemia, goal LDL below 70   . H/O echocardiogram March ; January 2015   a) Normal LV size and cavity appeared normal function. Grade 1 diastolic dysfunction. Limited study.; b) normal LV size and function with EF 60-65%. Aortic sclerosis without stenosis, mildly increased PA pressures of 44mmHg but normal IVC and RA/RV size  . History of non-ST Elevation MI (myocardial infarction) March 2007   Admitted with COPD exacerbation, given by CHF. Had some chest  pain with positive troponins. Cath showed multivessel disease, referred for CABG  . History of pneumonia   . Hypertension   . Hypoxia     chronic, on home O2  . Myocardial infarction (HCC)   . Obesity (BMI 30-39.9)    One year ago weighed 265 pounds, (12/2012) -- now 234 pounds  . PAF (paroxysmal atrial fibrillation) (HCC)    Anticoagulated on warfarin. Stable -- post op  . Pneumonia    hx  . 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 Seasonal allergies   . Shortness of breath dyspnea     Medications:  Prescriptions Prior to Admission  Medication Sig Dispense Refill Last Dose  . amLODipine (NORVASC) 10 MG tablet Take 1 tablet (10 mg total) by mouth daily. 30 tablet 1 01/17/2017 at Unknown time  . aspirin EC 81 MG tablet Take 81 mg by mouth every morning.    01/17/2017 at Unknown time  . atorvastatin (LIPITOR) 40 MG tablet Take 1 tablet (40 mg total) by mouth daily. 90 tablet 3 01/17/2017 at Unknown time  . budesonide-formoterol (SYMBICORT) 160-4.5 MCG/ACT inhaler Inhale 2 puffs into the lungs 2 (two) times daily. 2 Inhaler 0 01/17/2017 at Unknown time  . cholecalciferol (VITAMIN D) 1000 UNITS tablet Take 1,000 Units by mouth every morning.    01/17/2017 at Unknown time  . furosemide (LASIX) 40 MG tablet Take 1 tablet (40 mg total) by mouth 2 (two) times daily. 90 tablet 1  01/17/2017 at Unknown time  . ipratropium-albuterol (DUONEB) 0.5-2.5 (3) MG/3ML SOLN Take 3 mLs by nebulization every 6 (six) hours as needed (wheezing and shortness of breath).    unknown  . OXYGEN Inhale 3 L into the lungs as needed (shortness of breath).    unknown  . potassium chloride SA (K-DUR,KLOR-CON) 20 MEQ tablet TAKE 1 TABLET (20 MEQ TOTAL) BY MOUTH DAILY. 30 tablet 8 01/17/2017 at Unknown time  . warfarin (COUMADIN) 2.5 MG tablet TAKE 1-2 TABLETS BY MOUTH DAILY AS DIRECTED BY COUMADIN CLININC (Patient taking differently: TAKE 1 tablet daily except for MWF takes two tablets.) 45 tablet 2  01/17/2017 at 0800    Assessment: 83 yo M on coumadin PTA for afib.  Per coumadin clinic his last INR 11/22/16 2.1 on 2.5 mg qday x 5 mg MWF last dose 8/28 at 08 am.  Hg 14.4, pltc 158. No bleeding reported.  Pt has taken his coumadin for today.  INR on admission is therapeutic at 2.09.  Today, 01/21/17   INR 1.70, subtherapeutic (pt didn't not receive dose on 8/30)  Hgb  WNL   No bleeding issues noted  Heart Healthy Diet   No Drug-Drug Interactions   Goal of Therapy:  INR 2-3 Monitor platelets by anticoagulation protocol: Yes   Plan:   Warfarin 5 mg PO x1   Daily INR  Monitor for signs and symptoms of bleeding   Arley Phenix RPh 01/21/2017, 9:41 AM Pager [phone]

## 2017-01-21 NOTE — Discharge Summary (Addendum)
Physician Discharge Summary  Roberto Reilly ZOX:[id number] DOB: [birth year] DOA: 01/17/2017  PCP: Roberto Grippe, MD  Admit date: 01/17/2017 Discharge date: 01/21/2017  Recommendations for Outpatient Follow-up:  1. Pt will need to follow up with PCP in 1-2 weeks post discharge 2. Please obtain BMP to evaluate electrolytes and kidney function 3. Please also check CBC to evaluate Hg and Hct levels  Discharge Diagnoses:  Principal Problem:   Acute diastolic CHF (congestive heart failure) (HCC) Active Problems:   Chronic anticoagulation   CAD in native artery - LM, RCA, RI & Cx disease --> CABG x 4 (LIMA-LAD, SVG-OM/RI, SVG-rPDA-rPL)   S/P CABG x 4   Paroxysmal atrial fibrillation (HCC); CHA2DS2-VASc Score = 4. On Warfarin   Dyslipidemia, goal LDL below 70   Essential hypertension   AAA (abdominal aortic aneurysm) without rupture seen on CT scan   Pleural effusion   Acute and chronic respiratory failure with hypoxia (HCC)   Chronic renal failure syndrome, stage 3 (moderate)  Discharge Condition: Stable  Diet recommendation: Heart healthy diet discussed in details   History of present illness:  83 y.o.malewith medical history significant of COPD on chronic 3 L oxygen, diastolic heart failure, CAD status post CABG, history of MI, paroxysmal atrial fibrillation on chronic Coumadin, hyperlipidemia, diverticulosis, AAA, presented with main concern of 10 days duration of progressively worsening dyspnea and orthopnea, wheezing and fatigue. At baseline, able to walk about 100 yards.   Assessment & Plan:  Acute on chronic resp failure with hypoxia, oxygen dependent, Acute diastolicheart failure, Acute COPD  - Last echocardiogram in June 2017 significant for an EF of 55% to 60% with no mention of diastolic dysfunction. BNP decreased from May.  - pt reports continued improvement  - pt has been on lasix IV BID, changed to PO per home medical regimen on discharge  - weight trend:      Filed  Weights   01/18/17 0754 01/19/17 0500 01/20/17 0445  Weight: 97.4 kg (214 lb 11.7 oz) 95.1 kg (209 lb 10.5 oz) 95.3 kg (210 lb 1.6 oz)  - ECHO with stable EF 55 - 60%  COPD, acute - continue BD's per home regimen   Paroxysmalatrial fibrillation. - coumadin per home regimen   CKD 3 - Cr trending down after dose and frequency of lasix lowered - resume home regimen lasix   AAA - pt declined further work up   Essential hypertension - resume home medical regimen   CAD s/p CABG Hyperlipidemia - Continue atorvastatin. - Continue aspirin  Leg lesion, left - wound care team saw pt, continue cleaning daily, air dry   DVT prophylaxis: Warfarin  Code Status: DNR Family Communication: Patient at bedside  Disposition Plan: home   Consultants:   None  Procedures:   None   Antimicrobials:   None   Procedures/Studies: Dg Chest 2 View  Result Date: 01/17/2017 CLINICAL DATA:  Shortness of Breath EXAM: CHEST  2 VIEW COMPARISON:  09/26/2016 FINDINGS: Prior CABG. Cardiomegaly. Small to moderate right pleural effusion is stable. Right lower lobe atelectasis. Vascular congestion. No acute bony abnormality. IMPRESSION: Cardiomegaly with vascular congestion. Right pleural effusion with right base atelectasis. Findings unchanged. Electronically Signed   By: Charlett Nose M.D.   On: 01/17/2017 13:15     Discharge Exam: Vitals:   01/21/17 0824 01/21/17 0829  BP:    Pulse:    Resp:    Temp:    SpO2: 93% 93%   Vitals:   01/21/17 0438 01/21/17 0823 01/21/17  [id number] 01/21/17 0829  BP: 116/71     Pulse: 82 76    Resp: 18 18    Temp: 97.7 F (36.5 C)     TempSrc: Oral     SpO2: 94% 93% 93% 93%  Weight: 96.7 kg (213 lb 3.2 oz)     Height:        General: Pt is alert, follows commands appropriately, not in acute distress Cardiovascular: Regular rate and rhythm, S1/S2 +, no rubs, no gallops Respiratory: Clear to auscultation bilaterally, no wheezing, no  crackles Abdominal: Soft, non tender, non distended, bowel sounds +, no guarding Extremities: no edema, no cyanosis, pulses palpable bilaterally DP and PT   Discharge Instructions  Discharge Instructions    Diet - low sodium heart healthy    Complete by:  As directed    Increase activity slowly    Complete by:  As directed      Allergies as of 01/21/2017      Reactions   Prednisone Other (See Comments)   Makes pt jittery and nervous      Medication List    TAKE these medications   amLODipine 10 MG tablet Commonly known as:  NORVASC Take 1 tablet (10 mg total) by mouth daily.   aspirin EC 81 MG tablet Take 81 mg by mouth every morning.   atorvastatin 40 MG tablet Commonly known as:  LIPITOR Take 1 tablet (40 mg total) by mouth daily.   budesonide-formoterol 160-4.5 MCG/ACT inhaler Commonly known as:  SYMBICORT Inhale 2 puffs into the lungs 2 (two) times daily.   cholecalciferol 1000 units tablet Commonly known as:  VITAMIN D Take 1,000 Units by mouth every morning.   DUONEB 0.5-2.5 (3) MG/3ML Soln Generic drug:  ipratropium-albuterol Take 3 mLs by nebulization every 6 (six) hours as needed (wheezing and shortness of breath).   furosemide 40 MG tablet Commonly known as:  LASIX Take 1 tablet (40 mg total) by mouth 2 (two) times daily.   OXYGEN Inhale 3 L into the lungs as needed (shortness of breath).   potassium chloride SA 20 MEQ tablet Commonly known as:  K-DUR,KLOR-CON TAKE 1 TABLET (20 MEQ TOTAL) BY MOUTH DAILY.   warfarin 2.5 MG tablet Commonly known as:  COUMADIN TAKE 1-2 TABLETS BY MOUTH DAILY AS DIRECTED BY COUMADIN CLININC What changed:  See the new instructions.            Discharge Care Instructions        Start     Ordered   01/21/17 0000  Increase activity slowly     01/21/17 1213   01/21/17 0000  Diet - low sodium heart healthy     01/21/17 1213      Follow-up Information    Roberto GrippeKim, Tylek, MD Follow up.   Specialty:  Internal  Medicine Contact information: [address] [place] [address] [phone]        Roberto Reilly, Roberto Reilly M, MD Follow up.   Specialty:  Internal Medicine Why:  call my cell phone with any questions [phone] Contact information: [address] [place] [address] [phone]            The results of significant diagnostics from this hospitalization (including imaging, microbiology, ancillary and laboratory) are listed below for reference.     Microbiology: No results found for this or any previous visit (from the past 240 hour(s)).   Labs: Basic Metabolic Panel:  Recent Labs Lab 01/17/17 1543 01/18/17 0353 01/19/17  0408 01/20/17 0414 01/21/17 0456  NA 139 138 137 138 136  K 4.6 4.4 4.0 4.9 4.3  CL 104 101 101 101 101  CO2 28 28 28 31 28   GLUCOSE 97 143* 99 87 95  BUN 22* 30* 34* 41* 32*  CREATININE 1.09 1.23 1.21 1.46* 1.31*  CALCIUM 8.8* 8.7* 8.5* 8.4* 8.4*   Liver Function Tests:  Recent Labs Lab 01/17/17 1543  AST 24  ALT 14*  ALKPHOS 66  BILITOT 1.2  PROT 6.6  ALBUMIN 4.1   CBC:  Recent Labs Lab 01/17/17 1543 01/19/17 0408 01/20/17 0414 01/21/17 0456  WBC 8.4 10.9* 8.2 10.5  NEUTROABS 6.0  --   --   --   HGB 14.4 14.4 14.5 15.0  HCT 44.1 43.1 44.8 46.0  MCV 85.8 83.7 85.7 85.5  PLT 158 170 159 164   Cardiac Enzymes:  Recent Labs Lab 01/17/17 1543  TROPONINI <0.03   BNP: BNP (last 3 results)  Recent Labs  05/18/16 1209 09/26/16 1141 01/17/17 1543  BNP 249.8* 440.5* 194.9*    SIGNED: Time coordinating discharge: 60 minutes  Debbora Presto, MD  [facility] 01/21/2017, 12:14 PM Pager [phone]  If 7PM-7AM, please contact night-coverage [email or web address] Password [email or web address]

## 2017-01-21 NOTE — Progress Notes (Signed)
Reviewed discharge information with patient and caregiver. Answered all questions. Patient able to teach back medications and reasons to contact MD or 911. Patient verbalizes importance of PCP follow up appointment.

## 2017-01-21 NOTE — Discharge Instructions (Signed)

## 2017-01-21 NOTE — Progress Notes (Signed)
RT encouraged patient to wear O2 as he wears O2 at home and needs it to keep sats above 92%

## 2017-01-21 NOTE — Care Management Important Message (Signed)
Important Message  Patient Details  Name: Roberto ChristmasJames Gros MRN: [id number] Date of Birth: 03/23/1934   Medicare Important Message Given:  Yes    Elliot CousinShavis, Blakely Gluth Ellen, RN 01/21/2017, 3:17 PM

## 2017-01-27 DIAGNOSIS — I480 Paroxysmal atrial fibrillation: Secondary | ICD-10-CM | POA: Diagnosis not present

## 2017-01-27 DIAGNOSIS — R972 Elevated prostate specific antigen [PSA]: Secondary | ICD-10-CM | POA: Diagnosis not present

## 2017-01-27 DIAGNOSIS — Z09 Encounter for follow-up examination after completed treatment for conditions other than malignant neoplasm: Secondary | ICD-10-CM | POA: Diagnosis not present

## 2017-01-27 DIAGNOSIS — E7800 Pure hypercholesterolemia, unspecified: Secondary | ICD-10-CM | POA: Diagnosis not present

## 2017-01-27 DIAGNOSIS — I10 Essential (primary) hypertension: Secondary | ICD-10-CM | POA: Diagnosis not present

## 2017-01-31 DIAGNOSIS — J441 Chronic obstructive pulmonary disease with (acute) exacerbation: Secondary | ICD-10-CM | POA: Diagnosis not present

## 2017-01-31 DIAGNOSIS — R972 Elevated prostate specific antigen [PSA]: Secondary | ICD-10-CM | POA: Diagnosis not present

## 2017-01-31 DIAGNOSIS — I5032 Chronic diastolic (congestive) heart failure: Secondary | ICD-10-CM | POA: Diagnosis not present

## 2017-01-31 DIAGNOSIS — R2681 Unsteadiness on feet: Secondary | ICD-10-CM | POA: Diagnosis not present

## 2017-02-01 ENCOUNTER — Encounter (INDEPENDENT_AMBULATORY_CARE_PROVIDER_SITE_OTHER): Payer: Medicare Other | Admitting: Ophthalmology

## 2017-02-17 ENCOUNTER — Ambulatory Visit: Payer: Medicare Other | Admitting: Cardiology

## 2017-02-21 ENCOUNTER — Emergency Department (HOSPITAL_COMMUNITY): Payer: Medicare Other

## 2017-02-21 ENCOUNTER — Encounter (HOSPITAL_COMMUNITY): Payer: Self-pay | Admitting: Emergency Medicine

## 2017-02-21 ENCOUNTER — Inpatient Hospital Stay (HOSPITAL_COMMUNITY)
Admission: EM | Admit: 2017-02-21 | Discharge: 2017-02-25 | DRG: 291 | Disposition: A | Discharge status: Home Health | Payer: Medicare Other | Attending: Internal Medicine | Admitting: Internal Medicine

## 2017-02-21 DIAGNOSIS — Z951 Presence of aortocoronary bypass graft: Secondary | ICD-10-CM

## 2017-02-21 DIAGNOSIS — J9600 Acute respiratory failure, unspecified whether with hypoxia or hypercapnia: Secondary | ICD-10-CM | POA: Diagnosis present

## 2017-02-21 DIAGNOSIS — I130 Hypertensive heart and chronic kidney disease with heart failure and stage 1 through stage 4 chronic kidney disease, or unspecified chronic kidney disease: Principal | ICD-10-CM | POA: Diagnosis present

## 2017-02-21 DIAGNOSIS — Z7982 Long term (current) use of aspirin: Secondary | ICD-10-CM | POA: Diagnosis not present

## 2017-02-21 DIAGNOSIS — Z23 Encounter for immunization: Secondary | ICD-10-CM

## 2017-02-21 DIAGNOSIS — J9601 Acute respiratory failure with hypoxia: Secondary | ICD-10-CM | POA: Diagnosis not present

## 2017-02-21 DIAGNOSIS — Z87891 Personal history of nicotine dependence: Secondary | ICD-10-CM | POA: Diagnosis not present

## 2017-02-21 DIAGNOSIS — Z9981 Dependence on supplemental oxygen: Secondary | ICD-10-CM

## 2017-02-21 DIAGNOSIS — I714 Abdominal aortic aneurysm, without rupture, unspecified: Secondary | ICD-10-CM | POA: Diagnosis present

## 2017-02-21 DIAGNOSIS — Z66 Do not resuscitate: Secondary | ICD-10-CM | POA: Diagnosis present

## 2017-02-21 DIAGNOSIS — I5033 Acute on chronic diastolic (congestive) heart failure: Secondary | ICD-10-CM | POA: Diagnosis not present

## 2017-02-21 DIAGNOSIS — I10 Essential (primary) hypertension: Secondary | ICD-10-CM | POA: Diagnosis not present

## 2017-02-21 DIAGNOSIS — I480 Paroxysmal atrial fibrillation: Secondary | ICD-10-CM | POA: Diagnosis present

## 2017-02-21 DIAGNOSIS — N183 Chronic kidney disease, stage 3 unspecified: Secondary | ICD-10-CM | POA: Diagnosis present

## 2017-02-21 DIAGNOSIS — E785 Hyperlipidemia, unspecified: Secondary | ICD-10-CM | POA: Diagnosis present

## 2017-02-21 DIAGNOSIS — Z89431 Acquired absence of right foot: Secondary | ICD-10-CM

## 2017-02-21 DIAGNOSIS — Z7901 Long term (current) use of anticoagulants: Secondary | ICD-10-CM

## 2017-02-21 DIAGNOSIS — R069 Unspecified abnormalities of breathing: Secondary | ICD-10-CM | POA: Diagnosis not present

## 2017-02-21 DIAGNOSIS — Z7951 Long term (current) use of inhaled steroids: Secondary | ICD-10-CM

## 2017-02-21 DIAGNOSIS — I2510 Atherosclerotic heart disease of native coronary artery without angina pectoris: Secondary | ICD-10-CM | POA: Diagnosis not present

## 2017-02-21 DIAGNOSIS — R0602 Shortness of breath: Secondary | ICD-10-CM | POA: Diagnosis not present

## 2017-02-21 DIAGNOSIS — Z8249 Family history of ischemic heart disease and other diseases of the circulatory system: Secondary | ICD-10-CM | POA: Diagnosis not present

## 2017-02-21 DIAGNOSIS — J9621 Acute and chronic respiratory failure with hypoxia: Secondary | ICD-10-CM | POA: Diagnosis present

## 2017-02-21 DIAGNOSIS — I509 Heart failure, unspecified: Secondary | ICD-10-CM | POA: Diagnosis not present

## 2017-02-21 DIAGNOSIS — Z8701 Personal history of pneumonia (recurrent): Secondary | ICD-10-CM

## 2017-02-21 DIAGNOSIS — J441 Chronic obstructive pulmonary disease with (acute) exacerbation: Secondary | ICD-10-CM

## 2017-02-21 DIAGNOSIS — I252 Old myocardial infarction: Secondary | ICD-10-CM

## 2017-02-21 DIAGNOSIS — I110 Hypertensive heart disease with heart failure: Secondary | ICD-10-CM | POA: Diagnosis not present

## 2017-02-21 LAB — CBC
HEMATOCRIT: 44.5 % (ref 39.0–52.0)
Hemoglobin: 14.9 g/dL (ref 13.0–17.0)
MCH: 29.2 pg (ref 26.0–34.0)
MCHC: 33.5 g/dL (ref 30.0–36.0)
MCV: 87.1 fL (ref 78.0–100.0)
Platelets: 174 10*3/uL (ref 150–400)
RBC: 5.11 MIL/uL (ref 4.22–5.81)
RDW: 14.5 % (ref 11.5–15.5)
WBC: 10.6 10*3/uL — ABNORMAL HIGH (ref 4.0–10.5)

## 2017-02-21 LAB — BLOOD GAS, ARTERIAL
Acid-Base Excess: 1.6 mmol/L (ref 0.0–2.0)
BICARBONATE: 26.3 mmol/L (ref 20.0–28.0)
Delivery systems: POSITIVE
Drawn by: 422461
EXPIRATORY PAP: 6
FIO2: 45
INSPIRATORY PAP: 12
LHR: 8 {breaths}/min
Mode: POSITIVE
O2 SAT: 98.7 %
PATIENT TEMPERATURE: 98.6
PCO2 ART: 43.8 mmHg (ref 32.0–48.0)
PH ART: 7.395 (ref 7.350–7.450)
pO2, Arterial: 140 mmHg — ABNORMAL HIGH (ref 83.0–108.0)

## 2017-02-21 LAB — BASIC METABOLIC PANEL
Anion gap: 11 (ref 5–15)
BUN: 29 mg/dL — AB (ref 6–20)
CO2: 27 mmol/L (ref 22–32)
CREATININE: 1.3 mg/dL — AB (ref 0.61–1.24)
Calcium: 9.1 mg/dL (ref 8.9–10.3)
Chloride: 103 mmol/L (ref 101–111)
GFR, EST AFRICAN AMERICAN: 58 mL/min — AB (ref >60)
GFR, EST NON AFRICAN AMERICAN: 50 mL/min — AB (ref >60)
GLUCOSE: 122 mg/dL — AB (ref 65–99)
POTASSIUM: 4.2 mmol/L (ref 3.5–5.1)
SODIUM: 141 mmol/L (ref 135–145)

## 2017-02-21 LAB — I-STAT TROPONIN, ED: Troponin i, poc: 0.01 ng/mL (ref 0.00–0.08)

## 2017-02-21 LAB — BRAIN NATRIURETIC PEPTIDE: B Natriuretic Peptide: 163.1 pg/mL — ABNORMAL HIGH (ref 0.0–100.0)

## 2017-02-21 LAB — PROTIME-INR
INR: 1.58
Prothrombin Time: 18.7 seconds — ABNORMAL HIGH (ref 11.4–15.2)

## 2017-02-21 MED ORDER — ONDANSETRON HCL 4 MG/2ML IJ SOLN: 4.0000 mg | Freq: Four times a day (QID) | INTRAMUSCULAR | Status: DC | PRN

## 2017-02-21 MED ORDER — ASPIRIN EC 81 MG PO TBEC
81.0000 mg | DELAYED_RELEASE_TABLET | Freq: Every day | ORAL | Status: DC
  Administered 2017-02-22 – 2017-02-25 (×4): 81 mg via ORAL
  Filled 2017-02-21 (×4): qty 1

## 2017-02-21 MED ORDER — ONDANSETRON HCL 4 MG PO TABS: 4.0000 mg | ORAL_TABLET | Freq: Four times a day (QID) | ORAL | Status: DC | PRN

## 2017-02-21 MED ORDER — ALBUTEROL SULFATE (2.5 MG/3ML) 0.083% IN NEBU
5.0000 mg | INHALATION_SOLUTION | Freq: Once | RESPIRATORY_TRACT | Status: AC
  Administered 2017-02-21: 5 mg via RESPIRATORY_TRACT
  Filled 2017-02-21: qty 6

## 2017-02-21 MED ORDER — MAGNESIUM SULFATE 2 GM/50ML IV SOLN
2.0000 g | Freq: Once | INTRAVENOUS | Status: AC
  Administered 2017-02-21: 2 g via INTRAVENOUS
  Filled 2017-02-21: qty 50

## 2017-02-21 MED ORDER — DOXYCYCLINE HYCLATE 100 MG IV SOLR
100.0000 mg | Freq: Two times a day (BID) | INTRAVENOUS | Status: DC
  Administered 2017-02-21 – 2017-02-24 (×6): 100 mg via INTRAVENOUS
  Filled 2017-02-21 (×8): qty 100

## 2017-02-21 MED ORDER — IPRATROPIUM BROMIDE 0.02 % IN SOLN
0.5000 mg | Freq: Once | RESPIRATORY_TRACT | Status: AC
  Administered 2017-02-21: 0.5 mg via RESPIRATORY_TRACT
  Filled 2017-02-21: qty 2.5

## 2017-02-21 MED ORDER — METHYLPREDNISOLONE SODIUM SUCC 125 MG IJ SOLR
125.0000 mg | Freq: Once | INTRAMUSCULAR | Status: AC
  Administered 2017-02-21: 125 mg via INTRAVENOUS
  Filled 2017-02-21: qty 2

## 2017-02-21 MED ORDER — AMLODIPINE BESYLATE 10 MG PO TABS
10.0000 mg | ORAL_TABLET | Freq: Every day | ORAL | Status: DC
  Administered 2017-02-22 – 2017-02-25 (×4): 10 mg via ORAL
  Filled 2017-02-21 (×4): qty 1

## 2017-02-21 MED ORDER — ACETAMINOPHEN 650 MG RE SUPP: 650.0000 mg | Freq: Four times a day (QID) | RECTAL | Status: DC | PRN

## 2017-02-21 MED ORDER — ACETAMINOPHEN 325 MG PO TABS
650.0000 mg | ORAL_TABLET | Freq: Four times a day (QID) | ORAL | Status: DC | PRN
Start: 2017-02-21 — End: 2017-02-25

## 2017-02-21 MED ORDER — ATORVASTATIN CALCIUM 40 MG PO TABS
40.0000 mg | ORAL_TABLET | Freq: Every day | ORAL | Status: DC
  Administered 2017-02-22 – 2017-02-24 (×3): 40 mg via ORAL
  Filled 2017-02-21 (×4): qty 1

## 2017-02-21 MED ORDER — METHYLPREDNISOLONE SODIUM SUCC 40 MG IJ SOLR
40.0000 mg | Freq: Two times a day (BID) | INTRAMUSCULAR | Status: DC
  Administered 2017-02-22 – 2017-02-25 (×7): 40 mg via INTRAVENOUS
  Filled 2017-02-21 (×7): qty 1

## 2017-02-21 MED ORDER — IPRATROPIUM-ALBUTEROL 0.5-2.5 (3) MG/3ML IN SOLN
3.0000 mL | RESPIRATORY_TRACT | Status: DC
  Administered 2017-02-21 – 2017-02-22 (×6): 3 mL via RESPIRATORY_TRACT
  Filled 2017-02-21 (×7): qty 3

## 2017-02-21 MED ORDER — BUDESONIDE 0.25 MG/2ML IN SUSP
0.2500 mg | Freq: Two times a day (BID) | RESPIRATORY_TRACT | Status: DC
  Administered 2017-02-22 – 2017-02-25 (×7): 0.25 mg via RESPIRATORY_TRACT
  Filled 2017-02-21 (×7): qty 2

## 2017-02-21 MED ORDER — FUROSEMIDE 10 MG/ML IJ SOLN
40.0000 mg | Freq: Two times a day (BID) | INTRAMUSCULAR | Status: DC
  Administered 2017-02-21 – 2017-02-25 (×8): 40 mg via INTRAVENOUS
  Filled 2017-02-21 (×8): qty 4

## 2017-02-21 MED ORDER — POTASSIUM CHLORIDE CRYS ER 20 MEQ PO TBCR
20.0000 meq | EXTENDED_RELEASE_TABLET | Freq: Every day | ORAL | Status: DC
  Administered 2017-02-22 – 2017-02-25 (×4): 20 meq via ORAL
  Filled 2017-02-21 (×4): qty 1

## 2017-02-21 MED ORDER — IPRATROPIUM-ALBUTEROL 0.5-2.5 (3) MG/3ML IN SOLN: 3.0000 mL | RESPIRATORY_TRACT | Status: DC | PRN

## 2017-02-21 NOTE — Progress Notes (Signed)
ANTICOAGULATION CONSULT NOTE - Initial Consult  Pharmacy Consult for warfarin Indication: atrial fibrillation  Allergies  Allergen Reactions  . Prednisone Other (See Comments)    Makes pt jittery and nervous         Vital Signs: Temp: 97.3 F (36.3 C) (10/02 2316) Temp Source: Axillary (10/02 2316) BP: 125/74 (10/02 2230) Pulse Rate: 84 (10/02 2230)  Labs:  Recent Labs  02/21/17 2014  HGB 14.9  HCT 44.5  PLT 174  LABPROT 18.7*  INR 1.58  CREATININE 1.30*    CrCl cannot be calculated (Unknown ideal weight.).   Medical History: Past Medical History:  Diagnosis Date  . AAA (abdominal aortic aneurysm) (HCC)    4.2 X 4.8 cm 04/2013 CT; declined re-eval 02/25/15  . Anxiety   . CAD in native artery March 2007   Cath for dyspnea on exertion: 75% distal LM, 85% RI, 95% mid-distal Cx, in multiple RCA lesions with 95% distal. --> Referred for CABG; has declined further noninvasive evaluation in the absence of worsening symptoms  . COPD (chronic obstructive pulmonary disease) with emphysema (HCC) 05/23/2007   Hosp 5/29-6/01/12- COPD exacerbation ONOX 12/08/10- desaturated to less than 88% for over an hour, qualifying for home O2 during sleep    . Diverticulosis of colon with hemorrhage 05/24/2013  . Dyslipidemia, goal LDL below 70   . H/O echocardiogram March ; January 2015   a) Normal LV size and cavity appeared normal function. Grade 1 diastolic dysfunction. Limited study.; b) normal LV size and function with EF 60-65%. Aortic sclerosis without stenosis, mildly increased PA pressures of but normal IVC and RA/RV size  . History of non-ST Elevation MI (myocardial infarction) March 2007   Admitted with COPD exacerbation, given by CHF. Had some chest pain with positive troponins. Cath showed multivessel disease, referred for CABG  . History of pneumonia   . Hypertension   . Hypoxia     chronic, on home O2  . Myocardial infarction (HCC)   . Obesity (BMI 30-39.9)    One  year ago weighed 265 pounds, (12/2012) -- now 234 pounds  . PAF (paroxysmal atrial fibrillation) (HCC)    Anticoagulated on warfarin. Stable -- post op  . Pneumonia    hx  . 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 Seasonal allergies   . Shortness of breath dyspnea     Medications:  Scheduled:  . [START ON 02/22/2017] amLODipine  10 mg Oral Daily  . [START ON 02/22/2017] aspirin EC  81 mg Oral Daily  . [START ON 02/22/2017] atorvastatin  40 mg Oral q1800  . budesonide (PULMICORT) nebulizer solution  0.25 mg Nebulization BID  . furosemide  40 mg Intravenous BID  . [START ON 02/22/2017] ipratropium-albuterol  3 mL Nebulization Q4H  . [START ON 02/22/2017] methylPREDNISolone (SOLU-MEDROL) injection  40 mg Intravenous Q12H  . [START ON 02/22/2017] potassium chloride SA  20 mEq Oral Daily   Infusions:  . doxycycline (VIBRAMYCIN) IV      Assessment: 8 yoM admitted with SOB on chronic warfarin for A-fib. HD 5 mg M/W/F and 2.5 mg T/Th/Sat/Sun.  INR=1.58 on admission. LD 10/2 0800.  Goal of Therapy:  INR 2-3    Plan:  Daily PT/NR  Susanne Greenhouse R 02/21/2017,11:30 PM

## 2017-02-21 NOTE — H&P (Signed)
History and Physical    Roberto Reilly JYN:[id number] DOB: [birth year] DOA: 02/21/2017  PCP: Pearson Grippe, MD  Patient coming from: Home.  Chief Complaint: Shortness of breath.  HPI: Roberto Reilly is a 83 y.o. male with history of COPD on home oxygen, CHF, CAD status post CABG, paroxysmal atrial fibrillation chronic kidney disease, abdominal aortic aneurysm presents to the ER with complaints of shortness of breath. Patient states he was doing fine since his last discharge last month. But last 2 weeks ago he started developing wheezing and productive cough which gradually worsened. Denies any chest pain. Patient also noticed increasing swelling in the lower extremities. Patient states he has been compliant with his Lasix and nebulizer treatment.  ED Course: In the ER patient was found to be hypoxic and had to be placed on BiPAP. On exam patient has elevated JVD lower extremity edema and bilateral wheezing. Chest x-ray shows mild pleural effusion on the right side. Patient was given nebulizer treatment and steroids and admitted for acute respiratory failure likely a combination of COPD and CHF.  Review of Systems: As per HPI, rest all negative.   Past Medical History:  Diagnosis Date  . AAA (abdominal aortic aneurysm) (HCC)    4.2 X 4.8 cm 04/2013 CT; declined re-eval 02/25/15  . Anxiety   . CAD in native artery March 2007   Cath for dyspnea on exertion: 75% distal LM, 85% RI, 95% mid-distal Cx, in multiple RCA lesions with 95% distal. --> Referred for CABG; has declined further noninvasive evaluation in the absence of worsening symptoms  . COPD (chronic obstructive pulmonary disease) with emphysema (HCC) 05/23/2007   Hosp 5/29-6/01/12- COPD exacerbation ONOX 12/08/10- desaturated to less than 88% for over an hour, qualifying for home O2 during sleep    . Diverticulosis of colon with hemorrhage 05/24/2013  . Dyslipidemia, goal LDL below 70   . H/O echocardiogram March ; January 2015   a) Normal LV  size and cavity appeared normal function. Grade 1 diastolic dysfunction. Limited study.; b) normal LV size and function with EF 60-65%. Aortic sclerosis without stenosis, mildly increased PA pressures of but normal IVC and RA/RV size  . History of non-ST Elevation MI (myocardial infarction) March 2007   Admitted with COPD exacerbation, given by CHF. Had some chest pain with positive troponins. Cath showed multivessel disease, referred for CABG  . History of pneumonia   . Hypertension   . Hypoxia     chronic, on home O2  . Myocardial infarction (HCC)   . Obesity (BMI 30-39.9)    One year ago weighed 265 pounds, (12/2012) -- now 234 pounds  . PAF (paroxysmal atrial fibrillation) (HCC)    Anticoagulated on warfarin. Stable -- post op  . Pneumonia    hx  . 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 Seasonal allergies   . Shortness of breath dyspnea     Past Surgical History:  Procedure Laterality Date  . ACHILLES TENDON LENGTHENING Right 10/08/2015   Procedure: ACHILLES TENDON LENGTHENING;  Surgeon: Toni Arthurs, MD;  Location: [facility] OR;  Service: Orthopedics;  Laterality: Right;  . AMPUTATION Right 10/08/2015   Procedure: RIGHT FOOT TRANSMET AMPUTATION;  Surgeon: Toni Arthurs, MD;  Location: [facility] OR;  Service: Orthopedics;  Laterality: Right;  . CARDIAC CATHETERIZATION  03 28 2007   NORMAL LV FUNCTION/ ABDOMINAL AORTA STENOSIS,75%-85%. RIGHT FEMORAL ARTERY :CATHETERS USED A  4-FRENCH WITH A 4-FRENCH SHEATH  . CATARACT EXTRACTION  Laser  . COLONOSCOPY N/A 05/24/2013   Procedure: COLONOSCOPY;  Surgeon: Iva Boop, MD;  Location: [facility];  Service: Endoscopy;  Laterality: N/A;  . CORONARY ARTERY BYPASS GRAFT    . RLL resection for Hamartoma     lungs  . RUL for hamartoma     lung     reports that he quit smoking about 18 years ago. His smoking use included Cigarettes. He has a 110.00 pack-year smoking history. He has never used smokeless tobacco. He  reports that he does not drink alcohol or use drugs.  Allergies  Allergen Reactions  . Prednisone Other (See Comments)    Makes pt jittery and nervous    Family History  Problem Relation Age of Onset  . Heart attack Mother   . Heart attack Father     Prior to Admission medications   Medication Sig Start Date End Date Taking? Authorizing Provider  amLODipine (NORVASC) 10 MG tablet Take 1 tablet (10 mg total) by mouth daily. 05/01/13  Yes Dorothea Ogle, MD  aspirin EC 81 MG tablet Take 81 mg by mouth every morning.    Yes [provider]  atorvastatin (LIPITOR) 40 MG tablet Take 1 tablet (40 mg total) by mouth daily. 12/01/16 03/01/17 Yes Marykay Lex, MD  budesonide-formoterol [facility]) 160-4.5 MCG/ACT inhaler Inhale 2 puffs into the lungs 2 (two) times daily. 11/30/16  Yes Young, Joni Fears D, MD  cholecalciferol (VITAMIN D) 1000 UNITS tablet Take 1,000 Units by mouth every morning.    Yes [provider]  furosemide (LASIX) 40 MG tablet Take 1 tablet (40 mg total) by mouth 2 (two) times daily. 09/27/16  Yes Randel Pigg, Dorma Russell, MD  ipratropium-albuterol (DUONEB) 0.5-2.5 (3) MG/3ML SOLN Take 3 mLs by nebulization every 6 (six) hours as needed (wheezing and shortness of breath).    Yes [provider]  OXYGEN Inhale 3 L into the lungs as needed (shortness of breath).    Yes [provider]  potassium chloride SA (K-DUR,KLOR-CON) 20 MEQ tablet TAKE 1 TABLET (20 MEQ TOTAL) BY MOUTH DAILY. 11/28/16  Yes Marykay Lex, MD  warfarin (COUMADIN) 2.5 MG tablet TAKE 1-2 TABLETS BY MOUTH DAILY AS DIRECTED BY COUMADIN CLININC Patient taking differently: TAKE 1 tablet daily except for MWF takes two tablets. 12/19/16  Yes Marykay Lex, MD    Physical Exam: Vitals:   02/21/17 2130 02/21/17 2200 02/21/17 2230 02/21/17 2316  BP: 134/85 (!) 151/77 125/74   Pulse: 92 81 84   Resp: 17 (!) 23 (!) 22   Temp:    (!) 97.3 F (36.3 C)  TempSrc:    Axillary  SpO2:  100% 100% 100%       Constitutional: Moderately built and nourished. Vitals:   02/21/17 2130 02/21/17 2200 02/21/17 2230 02/21/17 2316  BP: 134/85 (!) 151/77 125/74   Pulse: 92 81 84   Resp: 17 (!) 23 (!) 22   Temp:    (!) 97.3 F (36.3 C)  TempSrc:    Axillary  SpO2: 100% 100% 100%    Eyes: Anicteric no pallor. ENMT: No discharge from the ears eyes nose and mouth. Neck: JVP elevated no mass felt. Respiratory: Bilateral expiratory wheezes and crepitations. Cardiovascular: S1 and S2 heard no murmurs appreciated. Abdomen: Soft nontender bowel sounds present. No guarding or rigidity. Musculoskeletal: Mild edema both lower extremities. Skin: No rash. Skin appears warm. Neurologic: Alert awake oriented to time place and person. Moves all extremities. Psychiatric: Appears normal. Normal  affect.   Labs on Admission: I have personally reviewed following labs and imaging studies  CBC:  Recent Labs Lab 02/21/17 2014  WBC 10.6*  HGB 14.9  HCT 44.5  MCV 87.1  PLT 174   Basic Metabolic Panel:  Recent Labs Lab 02/21/17 2014  NA 141  K 4.2  CL 103  CO2 27  GLUCOSE 122*  BUN 29*  CREATININE 1.30*  CALCIUM 9.1   GFR: CrCl cannot be calculated (Unknown ideal weight.). Liver Function Tests: No results for input(s): AST, ALT, ALKPHOS, BILITOT, PROT, ALBUMIN in the last 168 hours. No results for input(s): LIPASE, AMYLASE in the last 168 hours. No results for input(s): AMMONIA in the last 168 hours. Coagulation Profile:  Recent Labs Lab 02/21/17 2014  INR 1.58   Cardiac Enzymes: No results for input(s): CKTOTAL, CKMB, CKMBINDEX, TROPONINI in the last 168 hours. BNP (last 3 results) No results for input(s): PROBNP in the last 8760 hours. HbA1C: No results for input(s): HGBA1C in the last 72 hours. CBG: No results for input(s): GLUCAP in the last 168 hours. Lipid Profile: No results for input(s): CHOL, HDL, LDLCALC, TRIG, CHOLHDL, LDLDIRECT in the last 72  hours. Thyroid Function Tests: No results for input(s): TSH, T4TOTAL, FREET4, T3FREE, THYROIDAB in the last 72 hours. Anemia Panel: No results for input(s): VITAMINB12, FOLATE, FERRITIN, TIBC, IRON, RETICCTPCT in the last 72 hours. Urine analysis:    Component Value Date/Time   COLORURINE YELLOW 09/26/2016 1206   APPEARANCEUR CLEAR 09/26/2016 1206   LABSPEC 1.010 09/26/2016 1206   PHURINE 5.0 09/26/2016 1206   GLUCOSEU NEGATIVE 09/26/2016 1206   HGBUR NEGATIVE 09/26/2016 1206   BILIRUBINUR NEGATIVE 09/26/2016 1206   KETONESUR NEGATIVE 09/26/2016 1206   PROTEINUR NEGATIVE 09/26/2016 1206   UROBILINOGEN 0.2 10/19/2010 1150   NITRITE NEGATIVE 09/26/2016 1206   LEUKOCYTESUR NEGATIVE 09/26/2016 1206   Sepsis Labs: (procalcitonin:4,lacticidven:4) )No results found for this or any previous visit (from the past 240 hour(s)).   Radiological Exams on Admission: Dg Chest Port 1 View  Result Date: 02/21/2017 CLINICAL DATA:  83 year old male with shortness of breath. EXAM: PORTABLE CHEST 1 VIEW COMPARISON:  Chest x-ray 01/17/2017. FINDINGS: Small right pleural effusion. Opacity at the right lung base favored to reflect subsegmental atelectasis. No definite acute consolidative airspace disease. Left costophrenic sulcus is not imaged. No evidence of pulmonary edema. Heart size is mildly enlarged. Upper mediastinal contours are distorted by patient positioning. Aortic atherosclerosis. Status post median sternotomy for CABG. IMPRESSION: 1. Small right pleural effusion with probable subsegmental atelectasis in the right lower lobe. 2. Mild cardiomegaly. 3. Aortic atherosclerosis. Electronically Signed   By: Trudie Reed M.D.   On: 02/21/2017 20:10    EKG: Independently reviewed. Atrial fibrillation RBBB.  Assessment/Plan Principal Problem:   Acute respiratory failure with hypoxia (HCC) Active Problems:   CAD in native artery - LM, RCA, RI & Cx disease --> CABG x 4 (LIMA-LAD,  SVG-OM/RI, SVG-rPDA-rPL)   S/P CABG x 4   Paroxysmal atrial fibrillation (HCC); CHA2DS2-VASc Score = 4. On Warfarin   Essential hypertension   COPD with acute exacerbation (HCC)   AAA (abdominal aortic aneurysm) without rupture seen on CT scan   Chronic renal failure syndrome, stage 3 (moderate) (HCC)   Acute on chronic congestive heart failure (HCC)   Acute respiratory failure (HCC)    1. Acute respiratory failure with hypoxia likely a combination of COPD and CHF - patient is presently on BiPAP. Patient otherwise does not want to be intubated  and is a DO NOT RESUSCITATE. I have placed patient on Solu-Medrol Pulmicort doxycycline and nebulizer treatment. Lasix 40 mg IV every 12 has been ordered. Closely follow respiratory status intake output metabolic panel and daily weights. Last EF measured was in 01/10/1859 to 65%. 2. Chronic atrial fibrillation - rate controlled at this time without any rate limiting medications. Patient is on Coumadin. Coumadin will be dosed per pharmacy. 3. CAD status post CABG - denies any chest pain. On statins and aspirin. Patient is also on Coumadin. Check troponin. 4. Hypertension on amlodipine. 5. Hyperlipidemia on statins. 6. Abdominal aortic aneurysm - denies any abdominal pain. 7. Chronic kidney disease stage III - creatinine appears to be at baseline.  I have reviewed patient's old charts and labs.   DVT prophylaxis: Lovenox. Code Status: DO NOT RESUSCITATE.  Family Communication: Discussed with patient.  Disposition Plan: Home.  Consults called: None.  Admission status: Inpatient.    Eduard Clos MD [facility] Pager [phone].  If 7PM-7AM, please contact night-coverage [email or web address] Password [email or web address]  02/21/2017, 11:23 PM

## 2017-02-21 NOTE — ED Triage Notes (Signed)
Pt BIB EMS from home with complaints of SOB. Since prior admission he has had issues. The last week he has been declining. States he has been trying home medications as prescribed but is declining instead of feeling better and would like be evaluated. No CP. Significant cardiac history, has had a lower right lobectomy.

## 2017-02-21 NOTE — ED Provider Notes (Signed)
[facility]-EMERGENCY DEPT Provider Note   CSN: [id number] Arrival date & time: 02/21/17  1904     History   Chief Complaint Chief Complaint  Patient presents with  . Shortness of Breath    COPD    HPI Roberto Reilly is a 83 y.o. male.  Patient is an 83 year old male with a history of AAA, coronary artery disease, COPD, hypoxia, MI status post CABG, paroxysmal atrial fibrillation on Coumadin, CHF presenting today with 2 days of severe worsening shortness of breath. Patient states for the last month she's had a slow decline and shortness of breath in the last 2 days it's been severe. He has had green sputum production with cough but denies any fever. He denies hemoptysis. He is unsure if he's had worsening swelling in his legs. He's been taking all of his medications. He states prior to 2 days ago using his inhaler with typically take away the shortness of breath but in the last 2 days his inhaler and oxygen is not working. Having some mild discomfort in his chest which he states is more from heavy breathing and coughing. He denies any abdominal pain, nausea or vomiting.   The history is provided by the patient.  Shortness of Breath  This is a recurrent problem. The average episode lasts 2 days. The problem occurs continuously.The current episode started 2 days ago. The problem has been gradually worsening. Associated symptoms include cough, sputum production, wheezing and chest pain. Pertinent negatives include no fever, no hemoptysis, no vomiting, no abdominal pain, no leg pain and no leg swelling. Associated symptoms comments: Just discomfort with deep breathing from all the sob and coughing.  O/w no other chest pain. It is unknown what precipitated the problem. He has tried beta-agonist inhalers (oxygen) for the symptoms. The treatment provided no relief. He has had prior hospitalizations. He has had prior ED visits. Associated medical issues include COPD, CAD and heart failure.    Past Medical  History:  Diagnosis Date  . AAA (abdominal aortic aneurysm) (HCC)    4.2 X 4.8 cm 04/2013 CT; declined re-eval 02/25/15  . Anxiety   . CAD in native artery March 2007   Cath for dyspnea on exertion: 75% distal LM, 85% RI, 95% mid-distal Cx, in multiple RCA lesions with 95% distal. --> Referred for CABG; has declined further noninvasive evaluation in the absence of worsening symptoms  . COPD (chronic obstructive pulmonary disease) with emphysema (HCC) 05/23/2007   Hosp 5/29-6/01/12- COPD exacerbation ONOX 12/08/10- desaturated to less than 88% for over an hour, qualifying for home O2 during sleep    . Diverticulosis of colon with hemorrhage 05/24/2013  . Dyslipidemia, goal LDL below 70   . H/O echocardiogram March ; January 2015   a) Normal LV size and cavity appeared normal function. Grade 1 diastolic dysfunction. Limited study.; b) normal LV size and function with EF 60-65%. Aortic sclerosis without stenosis, mildly increased PA pressures of but normal IVC and RA/RV size  . History of non-ST Elevation MI (myocardial infarction) March 2007   Admitted with COPD exacerbation, given by CHF. Had some chest pain with positive troponins. Cath showed multivessel disease, referred for CABG  . History of pneumonia   . Hypertension   . Hypoxia     chronic, on home O2  . Myocardial infarction (HCC)   . Obesity (BMI 30-39.9)    One year ago weighed 265 pounds, (12/2012) -- now 234 pounds  . PAF (paroxysmal atrial fibrillation) (HCC)  Anticoagulated on warfarin. Stable -- post op  . Pneumonia    hx  . 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 Seasonal allergies   . Shortness of breath dyspnea     Patient Active Problem List   Diagnosis Date Noted  . Acute diastolic CHF (congestive heart failure) (HCC) 01/17/2017  . Acute bronchitis due to respiratory syncytial virus (RSV) 05/20/2016  . Acute on chronic respiratory failure with hypoxia and hypercapnia (HCC) 05/19/2016    . Shortness of breath 05/18/2016  . Chronic renal failure syndrome, stage 3 (moderate) (HCC) 05/18/2016  . Hypercapnia 05/18/2016  . Hyperglycemia   . Chronic osteomyelitis of right foot (HCC) 02/05/2016  . Acute and chronic respiratory failure with hypoxia (HCC)   . Acute on chronic respiratory failure (HCC) 11/03/2015  . Abnormal TSH 11/03/2015  . Pleural effusion 11/01/2015  . Acute renal failure (ARF) (HCC) 11/01/2015  . Sepsis (HCC) 11/01/2015  . Pneumonia 11/01/2015  . COPD exacerbation (HCC) 11/01/2015  . Encounter for long-term (current) use of other medications 01/07/2014  . Nasal sinus congestion 01/07/2014  . Diverticulosis of colon with hemorrhage 05/24/2013  . Internal hemorrhoids 05/24/2013  . AAA (abdominal aortic aneurysm) without rupture seen on CT scan 05/22/2013  . Rectal bleeding 05/21/2013  . Anemia 05/20/2013  . Generalized anxiety disorder 05/09/2013  . Auditory hallucination 05/09/2013  . COPD mixed type (HCC) 05/07/2013  . COPD with acute exacerbation (HCC) 05/07/2013  . Chest discomfort 05/07/2013  . Hx of scabies 04/29/2013  . Essential hypertension 04/29/2013  . Obesity (BMI 30-39.9) 01/18/2013  . Paroxysmal atrial fibrillation (HCC); CHA2DS2-VASc Score = 4. On Warfarin   . Dyslipidemia, goal LDL below 70   . Chronic anticoagulation 08/09/2012  . Reactive depression (situational) 09/06/2011  . CAD 05/23/2007  . CAD in native artery - LM, RCA, RI & Cx disease --> CABG x 4 (LIMA-LAD, SVG-OM/RI, SVG-rPDA-rPL) 07/21/2005    Class: History of  . S/P CABG x 4 07/21/2005    Past Surgical History:  Procedure Laterality Date  . ACHILLES TENDON LENGTHENING Right 10/08/2015   Procedure: ACHILLES TENDON LENGTHENING;  Surgeon: Toni Arthurs, MD;  Location: [facility] OR;  Service: Orthopedics;  Laterality: Right;  . AMPUTATION Right 10/08/2015   Procedure: RIGHT FOOT TRANSMET AMPUTATION;  Surgeon: Toni Arthurs, MD;  Location: [facility] OR;  Service: Orthopedics;  Laterality:  Right;  . CARDIAC CATHETERIZATION  03 28 2007   NORMAL LV FUNCTION/ ABDOMINAL AORTA STENOSIS,75%-85%. RIGHT FEMORAL ARTERY :CATHETERS USED A  4-FRENCH WITH A 4-FRENCH SHEATH  . CATARACT EXTRACTION     Laser  . COLONOSCOPY N/A 05/24/2013   Procedure: COLONOSCOPY;  Surgeon: Iva Boop, MD;  Location: [facility] ENDOSCOPY;  Service: Endoscopy;  Laterality: N/A;  . CORONARY ARTERY BYPASS GRAFT    . RLL resection for Hamartoma     lungs  . RUL for hamartoma     lung       Home Medications    Prior to Admission medications   Medication Sig Start Date End Date Taking? Authorizing Provider  amLODipine (NORVASC) 10 MG tablet Take 1 tablet (10 mg total) by mouth daily. 05/01/13   Dorothea Ogle, MD  aspirin EC 81 MG tablet Take 81 mg by mouth every morning.     [provider]  atorvastatin (LIPITOR) 40 MG tablet Take 1 tablet (40 mg total) by mouth daily. 12/01/16 03/01/17  Marykay Lex, MD  budesonide-formoterol [facility]) 160-4.5 MCG/ACT inhaler Inhale  2 puffs into the lungs 2 (two) times daily. 11/30/16   Jetty Duhamel D, MD  cholecalciferol (VITAMIN D) 1000 UNITS tablet Take 1,000 Units by mouth every morning.     [provider]  furosemide (LASIX) 40 MG tablet Take 1 tablet (40 mg total) by mouth 2 (two) times daily. 09/27/16   Lenox Ponds, MD  ipratropium-albuterol (DUONEB) 0.5-2.5 (3) MG/3ML SOLN Take 3 mLs by nebulization every 6 (six) hours as needed (wheezing and shortness of breath).     [provider]  OXYGEN Inhale 3 L into the lungs as needed (shortness of breath).     [provider]  potassium chloride SA (K-DUR,KLOR-CON) 20 MEQ tablet TAKE 1 TABLET (20 MEQ TOTAL) BY MOUTH DAILY. 11/28/16   Marykay Lex, MD  warfarin (COUMADIN) 2.5 MG tablet TAKE 1-2 TABLETS BY MOUTH DAILY AS DIRECTED BY COUMADIN CLININC Patient taking differently: TAKE 1 tablet daily except for MWF takes two tablets. 12/19/16   Marykay Lex, MD    Family  History Family History  Problem Relation Age of Onset  . Heart attack Mother   . Heart attack Father     Social History Social History  Substance Use Topics  . Smoking status: Former Smoker    Packs/day: 2.00    Years: 55.00    Types: Cigarettes    Quit date: 05/23/1998  . Smokeless tobacco: Never Used  . Alcohol use No     Allergies   Prednisone   Review of Systems Review of Systems  Constitutional: Negative for fever.  Respiratory: Positive for cough, sputum production, shortness of breath and wheezing. Negative for hemoptysis.   Cardiovascular: Positive for chest pain. Negative for leg swelling.  Gastrointestinal: Negative for abdominal pain and vomiting.  All other systems reviewed and are negative.    Physical Exam Updated Vital Signs BP (!) 177/86 (BP Location: Left Arm)   Pulse 100   Resp (!) 22   SpO2 100%   Physical Exam  Constitutional: He is oriented to person, place, and time. He appears well-developed and well-nourished. He appears distressed.  HENT:  Head: Normocephalic and atraumatic.  Mouth/Throat: Oropharynx is clear and moist. Mucous membranes are dry.  Eyes: Pupils are equal, round, and reactive to light. Conjunctivae and EOM are normal.  Neck: Normal range of motion. Neck supple.  Cardiovascular: Intact distal pulses.  An irregularly irregular rhythm present. Tachycardia present.   No murmur heard. Pulmonary/Chest: Accessory muscle usage present. Tachypnea noted. He is in respiratory distress. He has decreased breath sounds. He has wheezes. He has rales.  Abdominal: Soft. He exhibits no distension. There is no tenderness. There is no rebound and no guarding.  Belly breathing  Musculoskeletal: Normal range of motion. He exhibits edema. He exhibits no tenderness.  2+ pitting edema to the mid shin bilaterally  Neurological: He is alert and oriented to person, place, and time.  Skin: Skin is warm and dry. No rash noted. No erythema.  Psychiatric:  He has a normal mood and affect. His behavior is normal.  Nursing note and vitals reviewed.    ED Treatments / Results  Labs (all labs ordered are listed, but only abnormal results are displayed) Labs Reviewed  BASIC METABOLIC PANEL - Abnormal; Notable for the following:       Result Value   Glucose, Bld 122 (*)    BUN 29 (*)    Creatinine, Ser 1.30 (*)    GFR calc non Af Amer 50 (*)  GFR calc Af Amer 58 (*)    All other components within normal limits  CBC - Abnormal; Notable for the following:    WBC 10.6 (*)    All other components within normal limits  PROTIME-INR - Abnormal; Notable for the following:    Prothrombin Time 18.7 (*)    All other components within normal limits  BRAIN NATRIURETIC PEPTIDE - Abnormal; Notable for the following:    B Natriuretic Peptide 163.1 (*)    All other components within normal limits  BLOOD GAS, ARTERIAL - Abnormal; Notable for the following:    pO2, Arterial 140 (*)    All other components within normal limits  I-STAT TROPONIN, ED    EKG  EKG Interpretation  Date/Time:  Tuesday February 21 2017 19:47:12 EDT Ventricular Rate:  99 PR Interval:    QRS Duration: 146 QT Interval:  361 QTC Calculation: 464 R Axis:   134 Text Interpretation:  Atrial fibrillation RBBB and LPFB Baseline wander in lead(s) V1 V2 No significant change since last tracing Confirmed by Gwyneth Sprout ([id number]) on 02/21/2017 7:57:10 PM       Radiology Dg Chest Port 1 View  Result Date: 02/21/2017 CLINICAL DATA:  83 year old male with shortness of breath. EXAM: PORTABLE CHEST 1 VIEW COMPARISON:  Chest x-ray 01/17/2017. FINDINGS: Small right pleural effusion. Opacity at the right lung base favored to reflect subsegmental atelectasis. No definite acute consolidative airspace disease. Left costophrenic sulcus is not imaged. No evidence of pulmonary edema. Heart size is mildly enlarged. Upper mediastinal contours are distorted by patient positioning. Aortic  atherosclerosis. Status post median sternotomy for CABG. IMPRESSION: 1. Small right pleural effusion with probable subsegmental atelectasis in the right lower lobe. 2. Mild cardiomegaly. 3. Aortic atherosclerosis. Electronically Signed   By: Trudie Reed M.D.   On: 02/21/2017 20:10    Procedures Procedures (including critical care time)  Medications Ordered in ED Medications  albuterol (PROVENTIL) (2.5 MG/3ML) 0.083% nebulizer solution 5 mg (not administered)  ipratropium (ATROVENT) nebulizer solution 0.5 mg (not administered)  methylPREDNISolone sodium succinate (SOLU-MEDROL) 125 mg/2 mL injection 125 mg (not administered)  magnesium sulfate IVPB 2 g 50 mL (not administered)  albuterol (PROVENTIL) (2.5 MG/3ML) 0.083% nebulizer solution 5 mg (5 mg Nebulization Given 02/21/17 1939)     Initial Impression / Assessment and Plan / ED Course  I have reviewed the triage vital signs and the nursing notes.  Pertinent labs & imaging results that were available during my care of the patient were reviewed by me and considered in my medical decision making (see chart for details).     Patient is an elderly gentleman with multiple medical problems presenting today in acute respiratory distress. Patient is tachypnea, belly breathing with accessory muscle use, wheezing with rales. Patient could have pneumonia versus COPD exacerbation versus CHF exacerbation. Lower suspicion for PE as patient is chronically anticoagulated and does not have localized chest pain. A she is very uncomfortable at this time and will attempt BiPAP. Patient has strongly voiced that he does not want to be intubated if he is a DO NOT RESUSCITATE. Patient was given albuterol, Atrovent, magnesium and steroids. Chest x-ray, ABG, CBC, BMP, BNP, troponin, INR pending  9:12 PM Labs are consistent with a normal troponin, unchanged EKG, CBC with mild leukocytosis of 10,000, unchanged creatinine. ABG without acute findings. Patient is  subtherapeutic on his Coumadin with an INR of 1.58. Chest x-ray shows a small right pleural effusion in the right lower lobe with mild cardiomegaly  however BNP is improved at 163. Patient's symptoms are most likely COPD exacerbation however no signs of respiratory acidosis at this time. Patient's shortness of breath significantly improved with being placed on BiPAP given Solu-Medrol, another DuoNeb and magnesium. Will admit for further care.  CRITICAL CARE Performed by: Gwyneth Sprout Total critical care time: 40 minutes Critical care time was exclusive of separately billable procedures and treating other patients. Critical care was necessary to treat or prevent imminent or life-threatening deterioration. Critical care was time spent personally by me on the following activities: development of treatment plan with patient and/or surrogate as well as nursing, discussions with consultants, evaluation of patient's response to treatment, examination of patient, obtaining history from patient or surrogate, ordering and performing treatments and interventions, ordering and review of laboratory studies, ordering and review of radiographic studies, pulse oximetry and re-evaluation of patient's condition.  Final Clinical Impressions(s) / ED Diagnoses   Final diagnoses:  COPD exacerbation (HCC)  Acute on chronic congestive heart failure, unspecified heart failure type [facility])    New Prescriptions New Prescriptions   No medications on file     Gwyneth Sprout, MD 02/21/17 2155

## 2017-02-21 NOTE — ED Notes (Signed)
Bed: WA23 Expected date:  Expected time:  Means of arrival:  Comments: EMS-SOB 

## 2017-02-22 LAB — BASIC METABOLIC PANEL
Anion gap: 14 (ref 5–15)
BUN: 30 mg/dL — ABNORMAL HIGH (ref 6–20)
CALCIUM: 9.1 mg/dL (ref 8.9–10.3)
CO2: 25 mmol/L (ref 22–32)
CREATININE: 1.36 mg/dL — AB (ref 0.61–1.24)
Chloride: 98 mmol/L — ABNORMAL LOW (ref 101–111)
GFR calc Af Amer: 55 mL/min — ABNORMAL LOW (ref >60)
GFR, EST NON AFRICAN AMERICAN: 47 mL/min — AB (ref >60)
GLUCOSE: 197 mg/dL — AB (ref 65–99)
POTASSIUM: 4.6 mmol/L (ref 3.5–5.1)
SODIUM: 137 mmol/L (ref 135–145)

## 2017-02-22 LAB — MRSA PCR SCREENING: MRSA BY PCR: NEGATIVE

## 2017-02-22 LAB — CBC
HCT: 44.2 % (ref 39.0–52.0)
HEMOGLOBIN: 15.1 g/dL (ref 13.0–17.0)
MCH: 29.1 pg (ref 26.0–34.0)
MCHC: 34.2 g/dL (ref 30.0–36.0)
MCV: 85.2 fL (ref 78.0–100.0)
PLATELETS: 150 10*3/uL (ref 150–400)
RBC: 5.19 MIL/uL (ref 4.22–5.81)
RDW: 14.4 % (ref 11.5–15.5)
WBC: 6.1 10*3/uL (ref 4.0–10.5)

## 2017-02-22 LAB — PROTIME-INR
INR: 1.65
PROTHROMBIN TIME: 19.4 s — AB (ref 11.4–15.2)

## 2017-02-22 LAB — TROPONIN I
Troponin I: <0.03 ng/mL (ref <0.03)
Troponin I: <0.03 ng/mL (ref <0.03)

## 2017-02-22 LAB — MAGNESIUM: MAGNESIUM: 2.5 mg/dL — AB (ref 1.7–2.4)

## 2017-02-22 MED ORDER — WARFARIN SODIUM 6 MG PO TABS
6.0000 mg | ORAL_TABLET | Freq: Once | ORAL | Status: AC
  Administered 2017-02-22: 6 mg via ORAL
  Filled 2017-02-22: qty 1

## 2017-02-22 MED ORDER — INFLUENZA VAC SPLIT HIGH-DOSE 0.5 ML IM SUSY
0.5000 mL | PREFILLED_SYRINGE | INTRAMUSCULAR | Status: AC
  Administered 2017-02-24: 0.5 mL via INTRAMUSCULAR
  Filled 2017-02-22 (×2): qty 0.5

## 2017-02-22 MED ORDER — WARFARIN - PHARMACIST DOSING INPATIENT: Freq: Every day | Status: DC

## 2017-02-22 NOTE — Progress Notes (Signed)
ANTICOAGULATION CONSULT NOTE - Follow Up Consult  Pharmacy Consult for Warfarin Indication: atrial fibrillation  Allergies  Allergen Reactions  . Prednisone Other (See Comments)    Makes pt jittery and nervous    Patient Measurements: Height: 6' (182.9 cm) Weight: 214 lb 1.1 oz (97.1 kg) IBW/kg (Calculated) : 77.6  Vital Signs: Temp: 97.9 F (36.6 C) (10/03 0813) Temp Source: Oral (10/03 0813) BP: 142/94 (10/03 0900) Pulse Rate: 84 (10/02 2316)  Labs:  Recent Labs  02/21/17 2014 02/21/17 2358 02/22/17 0631  HGB 14.9  --  15.1  HCT 44.5  --  44.2  PLT 174  --  150  LABPROT 18.7*  --  19.4*  INR 1.58  --  1.65  CREATININE 1.30*  --  1.36*  TROPONINI  --  <0.03 <0.03    Estimated Creatinine Clearance: 51.5 mL/min (A) (by C-G formula based on SCr of 1.36 mg/dL (H)).   Medications:  Scheduled:  . amLODipine  10 mg Oral Daily  . aspirin EC  81 mg Oral Daily  . atorvastatin  40 mg Oral q1800  . budesonide (PULMICORT) nebulizer solution  0.25 mg Nebulization BID  . furosemide  40 mg Intravenous BID  . [START ON 02/23/2017] Influenza vac split quadrivalent PF  0.5 mL Intramuscular Tomorrow-1000  . ipratropium-albuterol  3 mL Nebulization Q4H  . methylPREDNISolone (SOLU-MEDROL) injection  40 mg Intravenous Q12H  . potassium chloride SA  20 mEq Oral Daily   Infusions:  . doxycycline (VIBRAMYCIN) IV Stopped (02/22/17 0136)    Assessment: 4 yoM admitted with acute respiratory failure from CHF and AECOPD.  PMH significant for afib on chronic warfarin anticoagulation.  Pharmacy is consulted to continue warfarin dosing inpatient.  Home warfarin dose: 2.5 mg daily except 5 mg on MWF.  Last dose on 10/2 at 0800. Admission INR 1.58 is subtherapeutic Last Coumadin Clinic visit 11/22/16 shows INR 2.1 on above warfarin dose.  Today, 02/22/2017: INR 1.65, subtherapeutic CBC: Hgb and Plt WNL Diet: heart healthy Drug drug interactions: corticosteroids and doxycycline may  increase INR   Goal of Therapy:  INR 2-3 Monitor platelets by anticoagulation protocol: Yes   Plan:  Warfarin 6 mg boosted dose PO x 1, give dose early at 1200 today. Daily PT/INR. Monitor for signs and symptoms of bleeding.  Lynann Beaver PharmD, BCPS Pager [phone] 02/22/2017 10:45 AM

## 2017-02-22 NOTE — Care Management Note (Signed)
Case Management Note  Patient Details  Name: Roberto Reilly MRN: [id number] Date of Birth: 1933-09-05  Subjective/Objective:                  83 y.o. male with history of COPD on home oxygen, CHF, CAD status post CABG, paroxysmal atrial fibrillation chronic kidney disease, abdominal aortic aneurysm presents to the ER with complaints of shortness of breath. Patient states he was doing fine since his last discharge last month. But last 2 weeks ago he started developing wheezing and productive cough which gradually worsened. Denies any chest pain. Patient also noticed increasing swelling in the lower extremities. Patient states he has been compliant with his Lasix and nebulizer treatment.  ED Course: In the ER patient was found to be hypoxic and had to be placed on BiPAP. On exam patient has elevated JVD lower extremity edema and bilateral wheezing. Chest x-ray shows mild pleural effusion on the right side. Patient was given nebulizer treatment and steroids and admitted for acute respiratory failure likely a combination of COPD and CHF.  Action/Plan: Date:  February 22, 2017 Chart reviewed for concurrent status and case management needs.  Will continue to follow patient progress.  Discharge Planning: following for needs  Expected discharge date: February 25, 2017  Marcelle Smiling, BSN, [place], [place]   [phone]   Expected Discharge Date:                  Expected Discharge Plan:  Home/Self Care  In-House Referral:     Discharge planning Services  CM Consult  Post Acute Care Choice:    Choice offered to:     DME Arranged:    DME Agency:     HH Arranged:    HH Agency:     Status of Service:  In process, will continue to follow  If discussed at Long Length of Stay Meetings, dates discussed:    Additional Comments:  Golda Acre, RN 02/22/2017, 9:06 AM

## 2017-02-22 NOTE — Progress Notes (Signed)
{  Pt taken off bipap at this time and placed on [place] 3 lp,. Home usage.  Will monitor.

## 2017-02-22 NOTE — Progress Notes (Signed)
PROGRESS NOTE    Roberto Reilly  ZOX:[id number] DOB: Oct 19, 1933 DOA: 02/21/2017 PCP: Roberto Grippe, MD   Brief Narrative:   83 year old male with history of COPD on home oxygen-3 L, CHF, CAD status post CABG, paroxysmal atrial fibrillation, CK D, AAA came to the hospital with complaints of shortness of breath. Upon admission he was found to have CHF and COPD exacerbation therefore being treated with diuretics and steroids along with bronchodilators.  Assessment & Plan:   Principal Problem:   Acute respiratory failure with hypoxia (HCC) Active Problems:   CAD in native artery - LM, RCA, RI & Cx disease --> CABG x 4 (LIMA-LAD, SVG-OM/RI, SVG-rPDA-rPL)   S/P CABG x 4   Paroxysmal atrial fibrillation (HCC); CHA2DS2-VASc Score = 4. On Warfarin   Essential hypertension   COPD with acute exacerbation (HCC)   AAA (abdominal aortic aneurysm) without rupture seen on CT scan   Chronic renal failure syndrome, stage 3 (moderate) (HCC)   Acute on chronic congestive heart failure (HCC)   Acute respiratory failure (HCC)  Acute hypoxic respiratory failure, improving Acute exacerbation of diastolic CHF, ejection fraction 65%, class III Acute exacerbation of mild to moderate persistent COPD -Echocardiogram done about 2 months ago was relatively unremarkable with ejection fraction 65% -Continue Lasix 40 mg IV twice daily, strict input and output, daily weights -Supplemental oxygen as needed, BiPAP as needed as well. -Continue Solu-Medrol 40 mg IV every 12 hours, nebulizer treatments -If his breathing status is significantly improved later today he can be transferred out to telemetry -Continue doxycycline for now  History of chronic atrial fibrillation -Currently rate controlled, on Coumadin. Pharmacy to dose  History of hypertension -Continue Norvasc  CAD status post CABG -Currently stable. Continue aspirin and statin  CKG stage III -Renal function appears to be stable at this time will closely  monitor with diuretics    DVT prophylaxis: On Coumadin Code Status: DO NOT RESUSCITATE Family Communication:  None at bedside Disposition Plan: Continue inpatient stay  Consultants:   None  Procedures:   None  Antimicrobials:   Doxycycline 10/2-   Subjective: Patient states he feels well better this morning in terms of shortness of breath but still notable short of breath with movement. No acute events overnight otherwise. He reports a medication and dietary compliance. Objective: Vitals:   02/22/17 0500 02/22/17 0800 02/22/17 0813 02/22/17 0900  BP: 139/62 122/67  (!) 142/94  Pulse:      Resp: Temp:   97.9 F (36.6 C)   TempSrc:   Oral   SpO2: 95% 96%  95%  Weight:      Height:        Intake/Output Summary (Last 24 hours) at 02/22/17 0957 Last data filed at 02/22/17 0136  Gross per 24 hour  Intake              300 ml  Output              200 ml  Net              100 ml   Filed Weights   02/21/17 2316  Weight: 97.1 kg (214 lb 1.1 oz)    Examination:  General exam: Appears calm and comfortable  Respiratory system: Diffuse mild coarse breath sounds with expiratory wheezing especially at the bases. Cardiovascular system: S1 & S2 heard, RRR. No JVD, murmurs, rubs, gallops or clicks. Trace bilateral lower extremity edema. JVD-10 cm Gastrointestinal system: Abdomen is nondistended, soft  and nontender. No organomegaly or masses felt. Normal bowel sounds heard. Central nervous system: Alert and oriented. No focal neurological deficits. Extremities: Symmetric 5 x 5 power. Skin: No rashes, lesions or ulcers Psychiatry: Judgement and insight appear normal. Mood & affect appropriate.     Data Reviewed:   CBC:  Recent Labs Lab 02/21/17 2014 02/22/17 0631  WBC 10.6* 6.1  HGB 14.9 15.1  HCT 44.5 44.2  MCV 87.1 85.2  PLT 174 150   Basic Metabolic Panel:  Recent Labs Lab 02/21/17 2014 02/22/17 0631  NA 141 137  K 4.2 4.6  CL 103 98*    CO2 27 25  GLUCOSE 122* 197*  BUN 29* 30*  CREATININE 1.30* 1.36*  CALCIUM 9.1 9.1  MG  --  2.5*   GFR: Estimated Creatinine Clearance: 51.5 mL/min (A) (by C-G formula based on SCr of 1.36 mg/dL (H)). Liver Function Tests: No results for input(s): AST, ALT, ALKPHOS, BILITOT, PROT, ALBUMIN in the last 168 hours. No results for input(s): LIPASE, AMYLASE in the last 168 hours. No results for input(s): AMMONIA in the last 168 hours. Coagulation Profile:  Recent Labs Lab 02/21/17 2014 02/22/17 0631  INR 1.58 1.65   Cardiac Enzymes:  Recent Labs Lab 02/21/17 2358 02/22/17 0631  TROPONINI <0.03 <0.03   BNP (last 3 results) No results for input(s): PROBNP in the last 8760 hours. HbA1C: No results for input(s): HGBA1C in the last 72 hours. CBG: No results for input(s): GLUCAP in the last 168 hours. Lipid Profile: No results for input(s): CHOL, HDL, LDLCALC, TRIG, CHOLHDL, LDLDIRECT in the last 72 hours. Thyroid Function Tests: No results for input(s): TSH, T4TOTAL, FREET4, T3FREE, THYROIDAB in the last 72 hours. Anemia Panel: No results for input(s): VITAMINB12, FOLATE, FERRITIN, TIBC, IRON, RETICCTPCT in the last 72 hours. Sepsis Labs: No results for input(s): PROCALCITON, LATICACIDVEN in the last 168 hours.  Recent Results (from the past 240 hour(s))  MRSA PCR Screening     Status: None   Collection Time: 02/21/17 11:05 PM  Result Value Ref Range Status   MRSA by PCR NEGATIVE NEGATIVE Final    Comment:        The GeneXpert MRSA Assay (FDA approved for NASAL specimens only), is one component of a comprehensive MRSA colonization surveillance program. It is not intended to diagnose MRSA infection nor to guide or monitor treatment for MRSA infections.          Radiology Studies: Dg Chest Port 1 View  Result Date: 02/21/2017 CLINICAL DATA:  83 year old male with shortness of breath. EXAM: PORTABLE CHEST 1 VIEW COMPARISON:  Chest x-ray 01/17/2017. FINDINGS:  Small right pleural effusion. Opacity at the right lung base favored to reflect subsegmental atelectasis. No definite acute consolidative airspace disease. Left costophrenic sulcus is not imaged. No evidence of pulmonary edema. Heart size is mildly enlarged. Upper mediastinal contours are distorted by patient positioning. Aortic atherosclerosis. Status post median sternotomy for CABG. IMPRESSION: 1. Small right pleural effusion with probable subsegmental atelectasis in the right lower lobe. 2. Mild cardiomegaly. 3. Aortic atherosclerosis. Electronically Signed   By: Trudie Reed M.D.   On: 02/21/2017 20:10        Scheduled Meds: . amLODipine  10 mg Oral Daily  . aspirin EC  81 mg Oral Daily  . atorvastatin  40 mg Oral q1800  . budesonide (PULMICORT) nebulizer solution  0.25 mg Nebulization BID  . furosemide  40 mg Intravenous BID  . [START ON 02/23/2017] Influenza vac split quadrivalent  PF  0.5 mL Intramuscular Tomorrow-1000  . ipratropium-albuterol  3 mL Nebulization Q4H  . methylPREDNISolone (SOLU-MEDROL) injection  40 mg Intravenous Q12H  . potassium chloride SA  20 mEq Oral Daily   Continuous Infusions: . doxycycline (VIBRAMYCIN) IV Stopped (02/22/17 0136)     LOS: 1 day    Time spent: 25 mins    Chesney Suares Joline Maxcy, MD [facility] Pager [phone]   If 7PM-7AM, please contact night-coverage [email or web address] Password [email or web address] 02/22/2017, 9:57 AM

## 2017-02-23 DIAGNOSIS — I5033 Acute on chronic diastolic (congestive) heart failure: Secondary | ICD-10-CM

## 2017-02-23 DIAGNOSIS — Z951 Presence of aortocoronary bypass graft: Secondary | ICD-10-CM

## 2017-02-23 DIAGNOSIS — I2510 Atherosclerotic heart disease of native coronary artery without angina pectoris: Secondary | ICD-10-CM

## 2017-02-23 DIAGNOSIS — I10 Essential (primary) hypertension: Secondary | ICD-10-CM

## 2017-02-23 LAB — PROTIME-INR
INR: 2.11
Prothrombin Time: 23.5 seconds — ABNORMAL HIGH (ref 11.4–15.2)

## 2017-02-23 MED ORDER — WARFARIN SODIUM 2.5 MG PO TABS
2.5000 mg | ORAL_TABLET | Freq: Once | ORAL | Status: AC
  Administered 2017-02-23: 2.5 mg via ORAL
  Filled 2017-02-23: qty 1

## 2017-02-23 MED ORDER — IPRATROPIUM-ALBUTEROL 0.5-2.5 (3) MG/3ML IN SOLN
3.0000 mL | Freq: Three times a day (TID) | RESPIRATORY_TRACT | Status: DC
  Administered 2017-02-23 – 2017-02-25 (×8): 3 mL via RESPIRATORY_TRACT
  Filled 2017-02-23 (×8): qty 3

## 2017-02-23 NOTE — Progress Notes (Addendum)
Progress Note    Roberto Reilly  ZOX:[id number] DOB: [birth year]  DOA: 02/21/2017 PCP: Pearson Grippe, MD    Brief Narrative:   Chief complaint: Follow-up respiratory failure  Medical records reviewed and are as summarized below:  Roberto Reilly is an 83 y.o. male with history of COPD on home oxygen, CHF, CAD status post CABG, paroxysmal atrial fibrillation chronic kidney disease, abdominal aortic aneurysm Who was admitted 02/21/17 with a two-week history of progressive shortness of breath, wheezing and productive cough. He also had some associated lower extremity edema. Patient was hypoxic on admission and had to be placed on BiPAP.  Assessment/Plan:   Principal Problem:   Acute respiratory failure with hypoxia (HCC)In the setting of COPD with acute exacerbation and acute on chronic diastolic CHF Likely a combination of COPD and CHF - patient Required BiPAP on admission, currently has been weaned to nasal cannula. Continue Solu-Medrol, Pulmicort, doxycycline and nebulizer treatment. Lasix 40 mg IV every 12 has been ordered with 1.9 L of diuresis overnight. Continue to monitor I/O and daily weights. Last EF measured was in 01/10/1859 to 65%. Still tachypneic with respiratory rate is high as 26 and hypoxic with oxygen saturations of 89% despite supplemental oxygen. Chest x-ray shows a small right pleural effusion with subsegmental atelectasis. WBC WNL. Afebrile. Will need continued diuresis. Appears volume overloaded with significant lower extremity edema.  Active Problems:   CAD in native artery - LM, RCA, RI & Cx disease --> CABG x 4 (LIMA-LAD, SVG-OM/RI, SVG-rPDA-rPL)   S/P CABG x 4/aortic atherosclerosis Continue aspirin, statin and Coumadin. No reports of chest pain on admission, and none now. Troponin negative 3.    Paroxysmal atrial fibrillation (HCC); CHA2DS2-VASc Score = 4. On Warfarin Has been rate controlled without any rate limiting medications. Patient is on Coumadin. Coumadin  will be dosed per pharmacy.    Essential hypertension Controlled on amlodipine. Also being diuresed.    AAA (abdominal aortic aneurysm) without rupture seen on CT scan No evidence of rupture. Annual follow-up per PCP.    Chronic renal failure syndrome, stage 3 (moderate) (HCC) Creatinine appears to be at baseline.   Family Communication/Anticipated D/C date and plan/Code Status   DVT prophylaxis: Coumadin ordered. Code Status: DO NOT RESUSCITATE.  Family Communication: No family present at the bedside. Daughter updated by telephone. Disposition Plan: Home when volume status improved and breathing back to baseline.   Medical Consultants:    None.   Anti-Infectives:    Doxycycline 02/21/17--->  Subjective:   Patient continues to be short of breath but feels better than he did when he came in. He is sitting up in the chair and reports that these episodes have become increasingly frequent. He wants to know what he could do to avoid hospitalization and managed his symptoms better at home. He stays on oxygen chronically other than briefly taking it off to go to the bathroom. He does not weigh himself daily.  Objective:    Vitals:   02/23/17 0300 02/23/17 0400 02/23/17 0500 02/23/17 0600  BP: (!) 161/73 111/80 (!) 143/57 (!) 148/86  Pulse:      Resp: (!) 21 (!) Temp: 97.6 F (36.4 C)     TempSrc: Oral     SpO2: 91% (!) 89% 92% 95%  Weight:      Height:        Intake/Output Summary (Last 24 hours) at 02/23/17 0725 Last data filed at 02/23/17 0228  Gross per 24 hour  Intake                0 ml  Output             1975 ml  Net            -1975 ml   Filed Weights   02/21/17 2316  Weight: 97.1 kg (214 lb 1.1 oz)    Exam: General: Mildly labored respirations but otherwise no acute distress. Sitting up in chair. Cardiovascular: Heart sounds show a regular rate, and rhythm. No gallops or rubs. No murmurs. No JVD. Lungs: Clear to auscultation bilaterally  with fair air movement, diminished in the bases. No rales, rhonchi or wheezes. Abdomen: Soft, nontender, nondistended with normal active bowel sounds. No masses. No hepatosplenomegaly. Neurological: Alert and oriented 3. Moves all extremities 4 with equal strength. Cranial nerves II through XII grossly intact. Skin: Warm and dry. No rashes or lesions. Venous stasis changes to the lower extremities. Extremities: No clubbing or cyanosis. 2-3 + edema. Pedal pulses 2+. Psychiatric: Mood and affect are normal. Insight and judgment are fair.   Data Reviewed:   I have personally reviewed following labs and imaging studies:  Labs: Labs show the following:   Basic Metabolic Panel:  Recent Labs Lab 02/21/17 2014 02/22/17 0631  NA 141 137  K 4.2 4.6  CL 103 98*  CO2 27 25  GLUCOSE 122* 197*  BUN 29* 30*  CREATININE 1.30* 1.36*  CALCIUM 9.1 9.1  MG  --  2.5*   GFR Estimated Creatinine Clearance: 51.5 mL/min (A) (by C-G formula based on SCr of 1.36 mg/dL (H)). Coagulation profile  Recent Labs Lab 02/21/17 2014 02/22/17 0631 02/23/17 0318  INR 1.58 1.65 2.11    CBC:  Recent Labs Lab 02/21/17 2014 02/22/17 0631  WBC 10.6* 6.1  HGB 14.9 15.1  HCT 44.5 44.2  MCV 87.1 85.2  PLT 174 150   Cardiac Enzymes:  Recent Labs Lab 02/21/17 2358 02/22/17 0631 02/22/17 1209  TROPONINI <0.03 <0.03 <0.03    Microbiology Recent Results (from the past 240 hour(s))  MRSA PCR Screening     Status: None   Collection Time: 02/21/17 11:05 PM  Result Value Ref Range Status   MRSA by PCR NEGATIVE NEGATIVE Final    Comment:        The GeneXpert MRSA Assay (FDA approved for NASAL specimens only), is one component of a comprehensive MRSA colonization surveillance program. It is not intended to diagnose MRSA infection nor to guide or monitor treatment for MRSA infections.     Procedures and diagnostic studies:  02/21/17: Dg Chest Port 1 View: My independent review of the  image shows: Volume overloaded with a right pleural effusion and bibasilar atelectasis. Cardiomegaly.    Medications:   . amLODipine  10 mg Oral Daily  . aspirin EC  81 mg Oral Daily  . atorvastatin  40 mg Oral q1800  . budesonide (PULMICORT) nebulizer solution  0.25 mg Nebulization BID  . furosemide  40 mg Intravenous BID  . Influenza vac split quadrivalent PF  0.5 mL Intramuscular Tomorrow-1000  . ipratropium-albuterol  3 mL Nebulization TID  . methylPREDNISolone (SOLU-MEDROL) injection  40 mg Intravenous Q12H  . potassium chloride SA  20 mEq Oral Daily  . Warfarin - Pharmacist Dosing Inpatient   Does not apply q1800   Continuous Infusions: . doxycycline (VIBRAMYCIN) IV Stopped (02/23/17 0049)     LOS: 2 days   RAMA,CHRISTINA  [facility] Pager [phone]. If  unable to reach me by pager, please call my cell phone at [phone].  *Please refer to [email or web address], password [email or web address] to get updated schedule on who will round on this patient, as hospitalists switch teams weekly. If 7PM-7AM, please contact night-coverage at [email or web address], password [email or web address] for any overnight needs.  02/23/2017, 7:25 AM

## 2017-02-23 NOTE — Progress Notes (Signed)
ANTICOAGULATION CONSULT NOTE - Follow Up Consult  Pharmacy Consult for Warfarin Indication: atrial fibrillation  Allergies  Allergen Reactions  . Prednisone Other (See Comments)    Makes pt jittery and nervous    Patient Measurements: Height: 6' (182.9 cm) Weight: 214 lb 1.1 oz (97.1 kg) IBW/kg (Calculated) : 77.6  Vital Signs: Temp: 97.6 F (36.4 C) (10/04 0300) Temp Source: Oral (10/04 0300) BP: 148/86 (10/04 0600) Pulse Rate: 68 (10/04 0742)  Labs:  Recent Labs  02/21/17 2014 02/21/17 2358 02/22/17 0631 02/22/17 1209 02/23/17 0318  HGB 14.9  --  15.1  --   --   HCT 44.5  --  44.2  --   --   PLT 174  --  150  --   --   LABPROT 18.7*  --  19.4*  --  23.5*  INR 1.58  --  1.65  --  2.11  CREATININE 1.30*  --  1.36*  --   --   TROPONINI  --  <0.03 <0.03 <0.03  --     Estimated Creatinine Clearance: 51.5 mL/min (A) (by C-G formula based on SCr of 1.36 mg/dL (H)).   Medications:  Scheduled:  . amLODipine  10 mg Oral Daily  . aspirin EC  81 mg Oral Daily  . atorvastatin  40 mg Oral q1800  . budesonide (PULMICORT) nebulizer solution  0.25 mg Nebulization BID  . furosemide  40 mg Intravenous BID  . Influenza vac split quadrivalent PF  0.5 mL Intramuscular Tomorrow-1000  . ipratropium-albuterol  3 mL Nebulization TID  . methylPREDNISolone (SOLU-MEDROL) injection  40 mg Intravenous Q12H  . potassium chloride SA  20 mEq Oral Daily  . Warfarin - Pharmacist Dosing Inpatient   Does not apply q1800   Infusions:  . doxycycline (VIBRAMYCIN) IV Stopped (02/23/17 0049)    Assessment: 68 yoM admitted with acute respiratory failure from CHF and AECOPD.  PMH significant for afib on chronic warfarin anticoagulation.  Pharmacy is consulted to continue warfarin dosing inpatient.  Home warfarin dose: 2.5 mg daily except 5 mg on MWF.  Last dose on 10/2 at 0800. Admission INR 1.58 is subtherapeutic Last Coumadin Clinic visit 11/22/16 shows INR 2.1 on above warfarin  dose.  Today, 02/23/2017: INR 2.11 large increase to therapeutic after boosted dose 10/3. CBC: Hgb and Plt WNL (10/3) Diet: heart healthy Drug drug interactions: corticosteroids and doxycycline may increase INR   Goal of Therapy:  INR 2-3 Monitor platelets by anticoagulation protocol: Yes   Plan:  Warfarin 2.5 mg PO x 1 at 1800 Daily PT/INR. Monitor for signs and symptoms of bleeding.  Lynann Beaver PharmD, BCPS Pager [phone] 02/23/2017 8:08 AM

## 2017-02-24 DIAGNOSIS — J9601 Acute respiratory failure with hypoxia: Secondary | ICD-10-CM

## 2017-02-24 LAB — BASIC METABOLIC PANEL
ANION GAP: 9 (ref 5–15)
BUN: 48 mg/dL — ABNORMAL HIGH (ref 6–20)
CHLORIDE: 101 mmol/L (ref 101–111)
CO2: 28 mmol/L (ref 22–32)
Calcium: 8.9 mg/dL (ref 8.9–10.3)
Creatinine, Ser: 1.29 mg/dL — ABNORMAL HIGH (ref 0.61–1.24)
GFR, EST AFRICAN AMERICAN: 58 mL/min — AB (ref >60)
GFR, EST NON AFRICAN AMERICAN: 50 mL/min — AB (ref >60)
Glucose, Bld: 144 mg/dL — ABNORMAL HIGH (ref 65–99)
POTASSIUM: 4.9 mmol/L (ref 3.5–5.1)
SODIUM: 138 mmol/L (ref 135–145)

## 2017-02-24 LAB — PROTIME-INR
INR: 2.13
Prothrombin Time: 23.7 seconds — ABNORMAL HIGH (ref 11.4–15.2)

## 2017-02-24 MED ORDER — DOXYCYCLINE HYCLATE 100 MG PO TABS
100.0000 mg | ORAL_TABLET | Freq: Two times a day (BID) | ORAL | Status: DC
  Administered 2017-02-24 – 2017-02-25 (×2): 100 mg via ORAL
  Filled 2017-02-24 (×2): qty 1

## 2017-02-24 MED ORDER — WARFARIN SODIUM 5 MG PO TABS
5.0000 mg | ORAL_TABLET | Freq: Once | ORAL | Status: AC
  Administered 2017-02-24: 5 mg via ORAL
  Filled 2017-02-24: qty 1

## 2017-02-24 NOTE — Progress Notes (Signed)
Progress Note    Roberto Reilly  ZOX:[id number] DOB: July 07, 1933  DOA: 02/21/2017 PCP: Pearson Grippe, MD    Brief Narrative:   Chief complaint: Follow-up respiratory failure  Medical records reviewed and are as summarized below:  Montravious Weigelt is an 83 y.o. male with history of COPD on home oxygen, CHF, CAD status post CABG, paroxysmal atrial fibrillation chronic kidney disease, abdominal aortic aneurysm Who was admitted 02/21/17 with a two-week history of progressive shortness of breath, wheezing and productive cough. He also had some associated lower extremity edema. Patient was hypoxic on admission and had to be placed on BiPAP.  Assessment/Plan:   Principal Problem:   Acute respiratory failure with hypoxia (HCC)In the setting of COPD with acute exacerbation and acute on chronic diastolic CHF Likely a combination of COPD and CHF - patient Required BiPAP on admission, currently has been weaned to nasal cannula. Continue Solu-Medrol, Pulmicort, doxycycline and nebulizer treatment. Lasix 40 mg IV every 12 has been ordered with 1.9 L of diuresis overnight. Continue to monitor I/O and daily weights. Last EF measured was in 01/10/1859 to 65%. Still tachypneic with respiratory rate is high as 26 and hypoxic with oxygen saturations of 89% despite supplemental oxygen. Chest x-ray shows a small right pleural effusion with subsegmental atelectasis. WBC WNL. Afebrile.  remain volume overloaded with significant lower extremity edema. Will need continued diuresis.  Change iv abx to oral to minimize volume.  Active Problems:   CAD in native artery - LM, RCA, RI & Cx disease --> CABG x 4 (LIMA-LAD, SVG-OM/RI, SVG-rPDA-rPL)   S/P CABG x 4/aortic atherosclerosis Continue aspirin, statin and Coumadin. No reports of chest pain on admission, and none now. Troponin negative 3.    Paroxysmal atrial fibrillation (HCC); CHA2DS2-VASc Score = 4. On Warfarin Has been rate controlled without any rate limiting  medications. Patient is on Coumadin. Coumadin will be dosed per pharmacy.    Essential hypertension Controlled on amlodipine. Also being diuresed.    AAA (abdominal aortic aneurysm) without rupture seen on CT scan No evidence of rupture. Annual follow-up per PCP.    Chronic renal failure syndrome, stage 3 (moderate) (HCC) Creatinine appears to be at baseline.   Family Communication/Anticipated D/C date and plan/Code Status   DVT prophylaxis: Coumadin ordered. Code Status: DO NOT RESUSCITATE.  Family Communication: No family present at the bedside. Daughter updated by telephone. Disposition Plan: Home when volume status improved and breathing back to baseline. Hopefully on 10/6   Medical Consultants:    None.   Anti-Infectives:    Doxycycline 02/21/17--->  Subjective:   Patient is feeing better, less cough, report sputum is yellow green color,  He denies pain, no fever . He is sitting up in the chair.  Marland Kitchen He wants to know what he could do to avoid hospitalization and managed his symptoms better at home. He stays on oxygen chronically other than briefly taking it off to go to the bathroom. He does not weigh himself daily.  Objective:    Vitals:   02/24/17 0630 02/24/17 0830 02/24/17 0842 02/24/17 1528  BP: 140/85 135/65  133/72  Pulse: 76 (!) 58  68  Resp: 20   18  Temp: (!) 97.5 F (36.4 C)   97.7 F (36.5 C)  TempSrc: Oral   Oral  SpO2: 95%  95% 98%  Weight:      Height:        Intake/Output Summary (Last 24 hours) at 02/24/17 1900 Last data filed at  02/24/17 1826  Gross per 24 hour  Intake              730 ml  Output             4675 ml  Net            -3945 ml   Filed Weights   02/21/17 2316  Weight: 97.1 kg (214 lb 1.1 oz)    Exam: General: Mildly labored respirations but otherwise no acute distress. Sitting up in chair. Cardiovascular: Heart sounds show a regular rate, and rhythm. No gallops or rubs. No murmurs. No JVD. Lungs: Clear to  auscultation bilaterally with fair air movement, diminished in the bases. No rales, rhonchi or wheezes. Abdomen: Soft, nontender, nondistended with normal active bowel sounds. No masses. No hepatosplenomegaly. Neurological: Alert and oriented 3. Moves all extremities 4 with equal strength. Cranial nerves II through XII grossly intact. Skin: Warm and dry. No rashes or lesions. Venous stasis changes to the lower extremities. Extremities: No clubbing or cyanosis. 2-3 + edema. Pedal pulses 2+. Psychiatric: Mood and affect are normal. Insight and judgment are fair.   Data Reviewed:   I have personally reviewed following labs and imaging studies:  Labs: Labs show the following:   Basic Metabolic Panel:  Recent Labs Lab 02/21/17 2014 02/22/17 0631 02/24/17 0357  NA 141 137 138  K 4.2 4.6 4.9  CL 103 98* 101  CO2 GLUCOSE 122* 197* 144*  BUN 29* 30* 48*  CREATININE 1.30* 1.36* 1.29*  CALCIUM 9.1 9.1 8.9  MG  --  2.5*  --    GFR Estimated Creatinine Clearance: 54.2 mL/min (A) (by C-G formula based on SCr of 1.29 mg/dL (H)). Coagulation profile  Recent Labs Lab 02/21/17 2014 02/22/17 0631 02/23/17 0318 02/24/17 0357  INR 1.58 1.65 2.11 2.13    CBC:  Recent Labs Lab 02/21/17 2014 02/22/17 0631  WBC 10.6* 6.1  HGB 14.9 15.1  HCT 44.5 44.2  MCV 87.1 85.2  PLT 174 150   Cardiac Enzymes:  Recent Labs Lab 02/21/17 2358 02/22/17 0631 02/22/17 1209  TROPONINI <0.03 <0.03 <0.03    Microbiology Recent Results (from the past 240 hour(s))  MRSA PCR Screening     Status: None   Collection Time: 02/21/17 11:05 PM  Result Value Ref Range Status   MRSA by PCR NEGATIVE NEGATIVE Final    Comment:        The GeneXpert MRSA Assay (FDA approved for NASAL specimens only), is one component of a comprehensive MRSA colonization surveillance program. It is not intended to diagnose MRSA infection nor to guide or monitor treatment for MRSA infections.      Procedures and diagnostic studies:  02/21/17: Dg Chest Port 1 View: My independent review of the image shows: Volume overloaded with a right pleural effusion and bibasilar atelectasis. Cardiomegaly.    Medications:   . amLODipine  10 mg Oral Daily  . aspirin EC  81 mg Oral Daily  . atorvastatin  40 mg Oral q1800  . budesonide (PULMICORT) nebulizer solution  0.25 mg Nebulization BID  . furosemide  40 mg Intravenous BID  . ipratropium-albuterol  3 mL Nebulization TID  . methylPREDNISolone (SOLU-MEDROL) injection  40 mg Intravenous Q12H  . potassium chloride SA  20 mEq Oral Daily  . Warfarin - Pharmacist Dosing Inpatient   Does not apply q1800   Continuous Infusions: . doxycycline (VIBRAMYCIN) IV Stopped (02/24/17 1252)     LOS: 3 days  Dashonda Bonneau MD PhD  [facility] Pager [phone].  *Please refer to [email or web address], password [email or web address] to get updated schedule on who will round on this patient, as hospitalists switch teams weekly. If 7PM-7AM, please contact night-coverage at [email or web address], password [email or web address] for any overnight needs.  02/24/2017, 7:00 PM

## 2017-02-24 NOTE — Progress Notes (Signed)
ANTICOAGULATION CONSULT NOTE - Follow Up Consult  Pharmacy Consult for Warfarin Indication: atrial fibrillation  Allergies  Allergen Reactions  . Prednisone Other (See Comments)    Makes pt jittery and nervous    Patient Measurements: Height: 6' (182.9 cm) Weight: 214 lb 1.1 oz (97.1 kg) IBW/kg (Calculated) : 77.6  Vital Signs: Temp: 97.5 F (36.4 C) (10/05 0630) Temp Source: Oral (10/05 0630) BP: 135/65 (10/05 0830) Pulse Rate: 58 (10/05 0830)  Labs:  Recent Labs  02/21/17 2014 02/21/17 2358 02/22/17 0631 02/22/17 1209 02/23/17 0318 02/24/17 0357  HGB 14.9  --  15.1  --   --   --   HCT 44.5  --  44.2  --   --   --   PLT 174  --  150  --   --   --   LABPROT 18.7*  --  19.4*  --  23.5* 23.7*  INR 1.58  --  1.65  --  2.11 2.13  CREATININE 1.30*  --  1.36*  --   --  1.29*  TROPONINI  --  <0.03 <0.03 <0.03  --   --     Estimated Creatinine Clearance: 54.2 mL/min (A) (by C-G formula based on SCr of 1.29 mg/dL (H)).   Medications:  Scheduled:  . amLODipine  10 mg Oral Daily  . aspirin EC  81 mg Oral Daily  . atorvastatin  40 mg Oral q1800  . budesonide (PULMICORT) nebulizer solution  0.25 mg Nebulization BID  . furosemide  40 mg Intravenous BID  . Influenza vac split quadrivalent PF  0.5 mL Intramuscular Tomorrow-1000  . ipratropium-albuterol  3 mL Nebulization TID  . methylPREDNISolone (SOLU-MEDROL) injection  40 mg Intravenous Q12H  . potassium chloride SA  20 mEq Oral Daily  . Warfarin - Pharmacist Dosing Inpatient   Does not apply q1800   Infusions:  . doxycycline (VIBRAMYCIN) IV 100 mg (02/23/17 2203)    Assessment: 38 yoM admitted with acute respiratory failure from CHF and AECOPD.  PMH significant for afib on chronic warfarin anticoagulation.  Pharmacy is consulted to continue warfarin dosing inpatient.  Home warfarin dose: 2.5 mg daily except 5 mg on MWF.  Last dose on 10/2 at 0800. Admission INR 1.58 is subtherapeutic Last Coumadin Clinic visit  11/22/16 shows INR 2.1 on above warfarin dose.  Today, 02/24/2017: INR 2.13  therapeutic  CBC: Hgb and Plt WNL (10/3) Diet: heart healthy Drug drug interactions: corticosteroids and doxycycline may increase INR   Goal of Therapy:  INR 2-3 Monitor platelets by anticoagulation protocol: Yes   Plan:  Warfarin 5 mg PO x 1 at 1800 Daily PT/INR. Monitor for signs and symptoms of bleeding.  Arley Phenix RPh 02/24/2017, 9:40 AM Pager [phone]

## 2017-02-24 NOTE — Care Management Important Message (Signed)
Important Message  Patient Details  Name: Roberto Reilly MRN: [id number] Date of Birth: [birth year]   Medicare Important Message Given:  Yes    Caren Macadam 02/24/2017, 11:19 AMImportant Message  Patient Details  Name: Roberto Reilly MRN: [id number] Date of Birth: July 05, 1933   Medicare Important Message Given:  Yes    Caren Macadam 02/24/2017, 11:18 AM

## 2017-02-25 LAB — COMPREHENSIVE METABOLIC PANEL
ALK PHOS: 62 U/L (ref 38–126)
ALT: 14 U/L — ABNORMAL LOW (ref 17–63)
ANION GAP: 9 (ref 5–15)
AST: 20 U/L (ref 15–41)
Albumin: 3.7 g/dL (ref 3.5–5.0)
BUN: 49 mg/dL — ABNORMAL HIGH (ref 6–20)
CALCIUM: 8.7 mg/dL — AB (ref 8.9–10.3)
CO2: 28 mmol/L (ref 22–32)
Chloride: 99 mmol/L — ABNORMAL LOW (ref 101–111)
Creatinine, Ser: 1.34 mg/dL — ABNORMAL HIGH (ref 0.61–1.24)
GFR calc non Af Amer: 48 mL/min — ABNORMAL LOW (ref >60)
GFR, EST AFRICAN AMERICAN: 56 mL/min — AB (ref >60)
Glucose, Bld: 144 mg/dL — ABNORMAL HIGH (ref 65–99)
Potassium: 4.9 mmol/L (ref 3.5–5.1)
SODIUM: 136 mmol/L (ref 135–145)
TOTAL PROTEIN: 6.1 g/dL — AB (ref 6.5–8.1)
Total Bilirubin: 0.5 mg/dL (ref 0.3–1.2)

## 2017-02-25 LAB — PROTIME-INR
INR: 2.15
Prothrombin Time: 23.8 seconds — ABNORMAL HIGH (ref 11.4–15.2)

## 2017-02-25 MED ORDER — WARFARIN SODIUM 5 MG PO TABS: 5.0000 mg | ORAL_TABLET | ORAL | Status: DC

## 2017-02-25 MED ORDER — DOXYCYCLINE HYCLATE 100 MG PO TABS: 100.0000 mg | ORAL_TABLET | Freq: Two times a day (BID) | ORAL | 0 refills | Status: AC

## 2017-02-25 MED ORDER — FUROSEMIDE 40 MG PO TABS: 40.0000 mg | ORAL_TABLET | Freq: Two times a day (BID) | ORAL | 0 refills | Status: DC

## 2017-02-25 MED ORDER — PREDNISONE 10 MG PO TABS: ORAL_TABLET | ORAL | 0 refills | Status: DC

## 2017-02-25 MED ORDER — WARFARIN SODIUM 2.5 MG PO TABS: 2.5000 mg | ORAL_TABLET | ORAL | Status: DC

## 2017-02-25 NOTE — Care Management Note (Signed)
Case Management Note  Patient Details  Name: Roberto Reilly MRN: [id number] Date of Birth: [birth year]  Subjective/Objective:    CHF, COPD,  Afib, HTN               Action/Plan: Discharge Planning: NCM spoke to pt and offered choice for HH/list provided. Pt agreeable to [facility] for HH. Contacted [facility] with new referral. Pt states he has cane, oxygen ([employer]) and neb machine at home. Daughter will bring his portable tank.   PCP Pearson Grippe MD    Expected Discharge Date:  02/25/17               Expected Discharge Plan:  Home w Home Health Services  In-House Referral:  NA  Discharge planning Services  CM Consult  Post Acute Care Choice:  Home Health Choice offered to:  Patient  DME Arranged:  N/A DME Agency:  NA  HH Arranged:  RN, PT, Social Work [employer] Agency:  [facility]  Status of Service:  Completed, signed off  If discussed at [employer], dates discussed:    Additional Comments:  Elliot Cousin, RN 02/25/2017, 1:18 PM

## 2017-02-25 NOTE — Discharge Summary (Signed)
Discharge Summary  Roberto Reilly ZOX:[id number] DOB: 1934-04-07  PCP: Pearson Grippe, MD  Admit date: 02/21/2017 Discharge date: 02/25/2017  Time spent: >54mins  Recommendations for Outpatient Follow-up:  1. F/u with PMD within a week  for hospital discharge follow up, repeat cbc/bmp at follow up 2. F/u with cardiology for chf/afib 3. Home health RN to check INR on Monday and Wednesday , report result to coumadin clinic  Discharge Diagnoses:  Active Hospital Problems   Diagnosis Date Noted  . Acute respiratory failure with hypoxia (HCC) 02/21/2017  . Acute on chronic congestive heart failure (HCC) 02/21/2017  . Acute respiratory failure (HCC) 02/21/2017  . Chronic renal failure syndrome, stage 3 (moderate) (HCC) 05/18/2016  . AAA (abdominal aortic aneurysm) without rupture seen on CT scan 05/22/2013  . COPD with acute exacerbation (HCC) 05/07/2013  . Essential hypertension 04/29/2013  . Paroxysmal atrial fibrillation (HCC); CHA2DS2-VASc Score = 4. On Warfarin   . S/P CABG x 4 07/21/2005  . CAD in native artery - LM, RCA, RI & Cx disease --> CABG x 4 (LIMA-LAD, SVG-OM/RI, SVG-rPDA-rPL) 07/21/2005    Class: History of    Resolved Hospital Problems   Diagnosis Date Noted Date Resolved  No resolved problems to display.    Discharge Condition: stable  Diet recommendation: heart healthy, fluids restriction to less than 1500cc per day  [facility] Weights   02/21/17 2316 02/25/17 0600  Weight: 97.1 kg (214 lb 1.1 oz) 98.5 kg (217 lb 1.6 oz)    History of present illness:  PCP: Pearson Grippe, MD  Patient coming from: Home.  Chief Complaint: Shortness of breath.  HPI: Roberto Reilly is a 83 y.o. male with history of COPD on home oxygen, CHF, CAD status post CABG, paroxysmal atrial fibrillation chronic kidney disease, abdominal aortic aneurysm presents to the ER with complaints of shortness of breath. Patient states he was doing fine since his last discharge last month. But last 2 weeks ago  he started developing wheezing and productive cough which gradually worsened. Denies any chest pain. Patient also noticed increasing swelling in the lower extremities. Patient states he has been compliant with his Lasix and nebulizer treatment.  ED Course: In the ER patient was found to be hypoxic and had to be placed on BiPAP. On exam patient has elevated JVD lower extremity edema and bilateral wheezing. Chest x-ray shows mild pleural effusion on the right side. Patient was given nebulizer treatment and steroids and admitted for acute respiratory failure likely a combination of COPD and CHF.   Hospital Course:  Principal Problem:   Acute respiratory failure with hypoxia (HCC) Active Problems:   CAD in native artery - LM, RCA, RI & Cx disease --> CABG x 4 (LIMA-LAD, SVG-OM/RI, SVG-rPDA-rPL)   S/P CABG x 4   Paroxysmal atrial fibrillation (HCC); CHA2DS2-VASc Score = 4. On Warfarin   Essential hypertension   COPD with acute exacerbation (HCC)   AAA (abdominal aortic aneurysm) without rupture seen on CT scan   Chronic renal failure syndrome, stage 3 (moderate) (HCC)   Acute on chronic congestive heart failure (HCC)   Acute respiratory failure (HCC)   Principal Problem:   Acute on chronic respiratory failure with hypoxia (HCC)In the setting of COPD (on home o2 3liters) with acute exacerbation and acute on chronic diastolic CHF Likely a combination of COPD and CHF - patient Required BiPAP on admission,  He was tachypneic with respiratory rate is high as 26 and hypoxic with oxygen saturations of 89% despite supplemental oxygen. currently  has been weaned to nasal cannula.  Patient report back to baseline.  COPD exacerbation: he received Solu-Medrol, Pulmicort, doxycycline and nebulizer treatment. He is discharged on oral sterids taper ( he report he is not allergic to steroids, steroids make him anxious), doxycycline.  Diastolic chf exacerbation:   Last EF measured was in 01/10/1859 to 65%.    Chest x-ray shows a small right pleural effusion with subsegmental atelectasis.  WBC WNL. Afebrile.  He received iv lasix, with good urine output , bilateral lower extremity decreased at discharge, he is discharged on oral lasix  bid.      CAD in native artery - LM, RCA, RI & Cx disease --> CABG x 4 (LIMA-LAD, SVG-OM/RI, SVG-rPDA-rPL)   S/P CABG x 4/aortic atherosclerosis Continue aspirin, statin and Coumadin. No reports of chest pain on admission, and none now. Troponin negative 3.    Paroxysmal atrial fibrillation (HCC); CHA2DS2-VASc Score = 4. On Warfarin In afib, Has been rate controlled without any rate limiting medications. Patient is on Coumadin. Home health RN to check INR while on abx.    Essential hypertension Controlled on amlodipine. Also being diuresed.    AAA (abdominal aortic aneurysm) without rupture seen on CT scan No evidence of rupture. Annual follow-up per PCP.    Chronic renal failure syndrome, stage 3 (moderate) (HCC) Creatinine appears to be at baseline.   Family Communication/Anticipated D/C date and plan/Code Status   DVT prophylaxis: Coumadin ordered. Code Status: DO NOT RESUSCITATE.  Family Communication: No family present at the bedside. Patient declined my offer to talk to his Daughter. Disposition Plan: Home with home health on 10/6   Medical Consultants:    None.   Anti-Infectives:    Doxycycline 02/21/17--->    Discharge Exam: BP (!) 143/65   Pulse 63   Temp (!) 97.5 F (36.4 C) (Oral)   Resp 20   Ht 6' (1.829 m)   Wt 98.5 kg (217 lb 1.6 oz)   SpO2 96%   BMI 29.44 kg/m   General: NAD Cardiovascular: IRRR Respiratory: wheezing has resolved Ab: nontender, + bs Extremities: bilateral chronic venous stasis changes, bilateral pitting edema has improved, s/p transmetatarsal amputation right foot.   Discharge Instructions You were cared for by a hospitalist during your hospital stay. If you have any questions  about your discharge medications or the care you received while you were in the hospital after you are discharged, you can call the unit and asked to speak with the hospitalist on call if the hospitalist that took care of you is not available. Once you are discharged, your primary care physician will handle any further medical issues. Please note that NO REFILLS for any discharge medications will be authorized once you are discharged, as it is imperative that you return to your primary care physician (or establish a relationship with a primary care physician if you do not have one) for your aftercare needs so that they can reassess your need for medications and monitor your lab values.  Discharge Instructions    Diet - low sodium heart healthy    Complete by:  As directed    Limit amount fluids intake to less than 1500cc per day   Discharge instructions    Complete by:  As directed    Please weight yourself daily, call your doctor if weight gain > 3lbs in three days,   Face-to-face encounter (required for Medicare/Medicaid patients)    Complete by:  As directed    I Eylin Pontarelli certify  that this patient is under my care and that I, or a nurse practitioner or physician's assistant working with me, had a face-to-face encounter that meets the physician face-to-face encounter requirements with this patient on 02/25/2017. The encounter with the patient was in whole, or in part for the following medical condition(s) which is the primary reason for home health care (List medical condition): FTT RN for heart failure management ,   RN to check coumadin level on Monday and Wednesday , report result to coumadin clinic.   The encounter with the patient was in whole, or in part, for the following medical condition, which is the primary reason for home health care:  FTT   I certify that, based on my findings, the following services are medically necessary home health services:   Nursing Physical therapy     Reason  for Medically Necessary Home Health Services:  Skilled Nursing- Change/Decline in Patient Status   My clinical findings support the need for the above services:  Shortness of breath with activity   Further, I certify that my clinical findings support that this patient is homebound due to:  Shortness of Breath with activity   Home Health    Complete by:  As directed    To provide the following care/treatments:   PT RN Social work     Increase activity slowly    Complete by:  As directed      Allergies as of 02/25/2017      Reactions   Prednisone Other (See Comments)   Makes pt jittery and nervous      Medication List    TAKE these medications   amLODipine 10 MG tablet Commonly known as:  NORVASC Take 1 tablet (10 mg total) by mouth daily.   aspirin EC 81 MG tablet Take 81 mg by mouth every morning.   atorvastatin 40 MG tablet Commonly known as:  LIPITOR Take 1 tablet (40 mg total) by mouth daily.   budesonide-formoterol 160-4.5 MCG/ACT inhaler Commonly known as:  SYMBICORT Inhale 2 puffs into the lungs 2 (two) times daily.   cholecalciferol 1000 units tablet Commonly known as:  VITAMIN D Take 1,000 Units by mouth every morning.   doxycycline 100 MG tablet Commonly known as:  VIBRA-TABS Take 1 tablet (100 mg total) by mouth every 12 (twelve) hours.   DUONEB 0.5-2.5 (3) MG/3ML Soln Generic drug:  ipratropium-albuterol Take 3 mLs by nebulization every 6 (six) hours as needed (wheezing and shortness of breath).   furosemide 40 MG tablet Commonly known as:  LASIX Take 1 tablet (40 mg total) by mouth 2 (two) times daily.   OXYGEN Inhale 3 L into the lungs as needed (shortness of breath).   potassium chloride SA 20 MEQ tablet Commonly known as:  K-DUR,KLOR-CON TAKE 1 TABLET (20 MEQ TOTAL) BY MOUTH DAILY.   predniSONE 10 MG tablet Commonly known as:  DELTASONE Label  & dispense according to the schedule below. 4 Pills PO on day one then, 3 Pills PO on day two, 2  Pills PO on day three, 1Pill PO on day four,  then STOP.  Total of 10 tabs   warfarin 2.5 MG tablet Commonly known as:  COUMADIN TAKE 1-2 TABLETS BY MOUTH DAILY AS DIRECTED BY COUMADIN CLININC What changed:  See the new instructions.      Allergies  Allergen Reactions  . Prednisone Other (See Comments)    Makes pt jittery and nervous   Follow-up Information    Pearson Grippe, MD  Follow up on 02/28/2017.   Specialty:  Internal Medicine Why:  hospital discharge follow up, repeat cbc/bmp at follow up. Contact information: [address] [place] [address] [place] [address] [phone]        Marykay Lex, MD Follow up in 2 week(s).   Specialty:  Cardiology Why:  for heart failure management Contact information: [address] [place] [address] [phone]            The results of significant diagnostics from this hospitalization (including imaging, microbiology, ancillary and laboratory) are listed below for reference.    Significant Diagnostic Studies: Dg Chest Port 1 View  Result Date: 02/21/2017 CLINICAL DATA:  83 year old male with shortness of breath. EXAM: PORTABLE CHEST 1 VIEW COMPARISON:  Chest x-ray 01/17/2017. FINDINGS: Small right pleural effusion. Opacity at the right lung base favored to reflect subsegmental atelectasis. No definite acute consolidative airspace disease. Left costophrenic sulcus is not imaged. No evidence of pulmonary edema. Heart size is mildly enlarged. Upper mediastinal contours are distorted by patient positioning. Aortic atherosclerosis. Status post median sternotomy for CABG. IMPRESSION: 1. Small right pleural effusion with probable subsegmental atelectasis in the right lower lobe. 2. Mild cardiomegaly. 3. Aortic atherosclerosis. Electronically Signed   By: Trudie Reed M.D.   On: 02/21/2017 20:10    Microbiology: Recent Results (from the past 240 hour(s))  MRSA PCR Screening     Status: None   Collection Time:  02/21/17 11:05 PM  Result Value Ref Range Status   MRSA by PCR NEGATIVE NEGATIVE Final    Comment:        The GeneXpert MRSA Assay (FDA approved for NASAL specimens only), is one component of a comprehensive MRSA colonization surveillance program. It is not intended to diagnose MRSA infection nor to guide or monitor treatment for MRSA infections.      Labs: Basic Metabolic Panel:  Recent Labs Lab 02/21/17 2014 02/22/17 0631 02/24/17 0357 02/25/17 0351  NA 141 137 138 136  K 4.2 4.6 4.9 4.9  CL 103 98* 101 99*  CO2 GLUCOSE 122* 197* 144* 144*  BUN 29* 30* 48* 49*  CREATININE 1.30* 1.36* 1.29* 1.34*  CALCIUM 9.1 9.1 8.9 8.7*  MG  --  2.5*  --   --    Liver Function Tests:  Recent Labs Lab 02/25/17 0351  AST 20  ALT 14*  ALKPHOS 62  BILITOT 0.5  PROT 6.1*  ALBUMIN 3.7   No results for input(s): LIPASE, AMYLASE in the last 168 hours. No results for input(s): AMMONIA in the last 168 hours. CBC:  Recent Labs Lab 02/21/17 2014 02/22/17 0631  WBC 10.6* 6.1  HGB 14.9 15.1  HCT 44.5 44.2  MCV 87.1 85.2  PLT 174 150   Cardiac Enzymes:  Recent Labs Lab 02/21/17 2358 02/22/17 0631 02/22/17 1209  TROPONINI <0.03 <0.03 <0.03   BNP: BNP (last 3 results)  Recent Labs  09/26/16 1141 01/17/17 1543 02/21/17 2014  BNP 440.5* 194.9* 163.1*    ProBNP (last 3 results) No results for input(s): PROBNP in the last 8760 hours.  CBG: No results for input(s): GLUCAP in the last 168 hours.     SignedAlbertine Grates MD, PhD  [facility] 02/25/2017, 12:45 PM

## 2017-02-25 NOTE — Progress Notes (Signed)
ANTICOAGULATION CONSULT NOTE - Follow Up Consult  Pharmacy Consult for Warfarin Indication: atrial fibrillation  Allergies  Allergen Reactions  . Prednisone Other (See Comments)    Makes pt jittery and nervous    Patient Measurements: Height: 6' (182.9 cm) Weight: 217 lb 1.6 oz (98.5 kg) IBW/kg (Calculated) : 77.6  Vital Signs: Temp: 97.5 F (36.4 C) (10/06 0600) Temp Source: Oral (10/06 0600) BP: 143/65 (10/06 0833) Pulse Rate: 63 (10/06 0833)  Labs:  Recent Labs  02/22/17 1209 02/23/17 0318 02/24/17 0357 02/25/17 0351  LABPROT  --  23.5* 23.7* 23.8*  INR  --  2.11 2.13 2.15  CREATININE  --   --  1.29* 1.34*  TROPONINI <0.03  --   --   --     Estimated Creatinine Clearance: 52.6 mL/min (A) (by C-G formula based on SCr of 1.34 mg/dL (H)).  Assessment: 13 yoM admitted with acute respiratory failure from CHF and AECOPD.  PMH significant for afib on chronic warfarin anticoagulation.  Pharmacy is consulted to continue warfarin dosing inpatient.  Home warfarin dose: 2.5 mg daily except 5 mg on MWF.  Last dose on 10/2 at 0800. Admission INR 1.58 is subtherapeutic Last Coumadin Clinic visit 11/22/16 shows INR 2.1 on above warfarin dose.  Today, 02/25/2017: INR 2.15  therapeutic  CBC: Hgb and Plt WNL (10/3) Diet: heart healthy Drug drug interactions: corticosteroids and doxycycline may increase INR   Goal of Therapy:  INR 2-3 Monitor platelets by anticoagulation protocol: Yes   Plan:  Resume home dose of 5 mg MWF and 2.5 TTSS Change daily PT/INR to MWF PT/INR Monitor for signs and symptoms of bleeding.  Herby Abraham, Pharm.D. [phone] 02/25/2017 9:22 AM

## 2017-02-25 NOTE — Evaluation (Signed)
Physical Therapy Evaluation Patient Details Name: Roberto Reilly MRN: [id number] DOB: August 06, 1933 Today's Date: 02/25/2017   History of Present Illness  83 yo male admitted with acute respiratory failure with hypoxia. Hx of CHF, A fib, CABG, CAD, COPD, ETOH abuse, R foot transmet amputation  Clinical Impression  On eval, pt was Min guard assist for mobility. He walked ~155 feet in the hallway. O2 sat 96% 3L [place] O2 at rest, 85% 3L [place] O2 during ambulation. Pt participated well. Do not anticipate any f/u PT needs at discharge-pt agrees. Will follow during hospital stay.     Follow Up Recommendations No PT follow up;Supervision - Intermittent    Equipment Recommendations  None recommended by PT    Recommendations for Other Services       Precautions / Restrictions Precautions Precautions: Fall Precaution Comments: O2 dep      Mobility  Bed Mobility               General bed mobility comments: oob in recliner  Transfers Overall transfer level: Modified independent   Transfers: Sit to/from Stand              Ambulation/Gait Ambulation/Gait assistance: Min guard Ambulation Distance (Feet): 155 Feet Assistive device: None Gait Pattern/deviations: Step-through pattern;Decreased stride length;Staggering left;Staggering right;Drifts right/left     General Gait Details: unsteady at times. Pt uses cane at baseline. O2 sat dropped to 85% on 3L [place] O2 during ambulation.  Stairs            Wheelchair Mobility    Modified Rankin (Stroke Patients Only)       Balance Overall balance assessment: Needs assistance           Standing balance-Leahy Scale: Fair                               Pertinent Vitals/Pain Pain Assessment: No/denies pain    Home Living Family/patient expects to be discharged to:: Private residence Living Arrangements: Alone Available Help at Discharge: Family;Available PRN/intermittently Type of Home: Apartment Home Access:  Level entry     Home Layout: One level Home Equipment: [place] Mor - single point Additional Comments: pt's daughter comes on Saturdays, cleans and gets his pills organized.  He had DME from wife, but gave it away because he didn't need it    Prior Function Level of Independence: Independent with assistive device(s)         Comments: uses cane at baseline when outside of home.      Hand Dominance        Extremity/Trunk Assessment   Upper Extremity Assessment Upper Extremity Assessment: Overall WFL for tasks assessed    Lower Extremity Assessment Lower Extremity Assessment: Generalized weakness    Cervical / Trunk Assessment Cervical / Trunk Assessment: Normal  Communication   Communication: HOH  Cognition Arousal/Alertness: Awake/alert Behavior During Therapy: WFL for tasks assessed/performed Overall Cognitive Status: Within Functional Limits for tasks assessed                                        General Comments      Exercises     Assessment/Plan    PT Assessment Patient needs continued PT services  PT Problem List Decreased mobility;Decreased activity tolerance;Decreased balance       PT Treatment Interventions Gait training;DME instruction;Therapeutic activities;Therapeutic exercise;Patient/family education;Balance training;Functional  mobility training    PT Goals (Current goals can be found in the Care Plan section)  Acute Rehab PT Goals Patient Stated Goal: home PT Goal Formulation: With patient Time For Goal Achievement: 03/11/17 Potential to Achieve Goals: Good    Frequency Min 3X/week   Barriers to discharge        Co-evaluation               AM-PAC PT "6 Clicks" Daily Activity  Outcome Measure Difficulty turning over in bed (including adjusting bedclothes, sheets and blankets)?: None Difficulty moving from lying on back to sitting on the side of the bed? : None Difficulty sitting down on and standing up from a chair  with arms (e.g., wheelchair, bedside commode, etc,.)?: None Help needed moving to and from a bed to chair (including a wheelchair)?: None Help needed walking in hospital room?: A Little Help needed climbing 3-5 steps with a railing? : A Little 6 Click Score: 22    End of Session Equipment Utilized During Treatment: Oxygen Activity Tolerance: Patient tolerated treatment well Patient left: in chair;with call bell/phone within reach   PT Visit Diagnosis: Difficulty in walking, not elsewhere classified (R26.2)    Time: 1030-1040 PT Time Calculation (min) (ACUTE ONLY): 10 min   Charges:   PT Evaluation $PT Eval Low Complexity: 1 Low     PT G Codes:         Rebeca Alert, MPT Pager: [phone]

## 2017-02-25 NOTE — Progress Notes (Signed)
Discharge instructions reviewed with patient. Patient verbalized understanding. Patient to be discharged via private vehicle with daughter.  

## 2017-02-28 DIAGNOSIS — I509 Heart failure, unspecified: Secondary | ICD-10-CM | POA: Diagnosis not present

## 2017-02-28 DIAGNOSIS — J441 Chronic obstructive pulmonary disease with (acute) exacerbation: Secondary | ICD-10-CM | POA: Diagnosis not present

## 2017-03-01 ENCOUNTER — Ambulatory Visit (INDEPENDENT_AMBULATORY_CARE_PROVIDER_SITE_OTHER): Payer: Medicare Other | Admitting: Pharmacist

## 2017-03-01 DIAGNOSIS — Z951 Presence of aortocoronary bypass graft: Secondary | ICD-10-CM | POA: Diagnosis not present

## 2017-03-01 DIAGNOSIS — Z87891 Personal history of nicotine dependence: Secondary | ICD-10-CM | POA: Diagnosis not present

## 2017-03-01 DIAGNOSIS — I482 Chronic atrial fibrillation: Secondary | ICD-10-CM | POA: Diagnosis not present

## 2017-03-01 DIAGNOSIS — N183 Chronic kidney disease, stage 3 (moderate): Secondary | ICD-10-CM | POA: Diagnosis not present

## 2017-03-01 DIAGNOSIS — Z7982 Long term (current) use of aspirin: Secondary | ICD-10-CM | POA: Diagnosis not present

## 2017-03-01 DIAGNOSIS — E669 Obesity, unspecified: Secondary | ICD-10-CM | POA: Diagnosis not present

## 2017-03-01 DIAGNOSIS — Z5181 Encounter for therapeutic drug level monitoring: Secondary | ICD-10-CM | POA: Diagnosis not present

## 2017-03-01 DIAGNOSIS — J449 Chronic obstructive pulmonary disease, unspecified: Secondary | ICD-10-CM | POA: Diagnosis not present

## 2017-03-01 DIAGNOSIS — Z7901 Long term (current) use of anticoagulants: Secondary | ICD-10-CM

## 2017-03-01 DIAGNOSIS — I509 Heart failure, unspecified: Secondary | ICD-10-CM | POA: Diagnosis not present

## 2017-03-01 DIAGNOSIS — I2510 Atherosclerotic heart disease of native coronary artery without angina pectoris: Secondary | ICD-10-CM | POA: Diagnosis not present

## 2017-03-01 DIAGNOSIS — J9601 Acute respiratory failure with hypoxia: Secondary | ICD-10-CM | POA: Diagnosis not present

## 2017-03-01 DIAGNOSIS — I130 Hypertensive heart and chronic kidney disease with heart failure and stage 1 through stage 4 chronic kidney disease, or unspecified chronic kidney disease: Secondary | ICD-10-CM | POA: Diagnosis not present

## 2017-03-01 DIAGNOSIS — Z9981 Dependence on supplemental oxygen: Secondary | ICD-10-CM | POA: Diagnosis not present

## 2017-03-01 DIAGNOSIS — I714 Abdominal aortic aneurysm, without rupture: Secondary | ICD-10-CM | POA: Diagnosis not present

## 2017-03-01 LAB — POCT INR: INR: 2.1

## 2017-03-09 ENCOUNTER — Ambulatory Visit (INDEPENDENT_AMBULATORY_CARE_PROVIDER_SITE_OTHER): Payer: Medicare Other | Admitting: Pharmacist

## 2017-03-09 ENCOUNTER — Telehealth: Payer: Self-pay | Admitting: Cardiology

## 2017-03-09 DIAGNOSIS — Z7901 Long term (current) use of anticoagulants: Secondary | ICD-10-CM

## 2017-03-09 DIAGNOSIS — I509 Heart failure, unspecified: Secondary | ICD-10-CM | POA: Diagnosis not present

## 2017-03-09 DIAGNOSIS — J449 Chronic obstructive pulmonary disease, unspecified: Secondary | ICD-10-CM | POA: Diagnosis not present

## 2017-03-09 DIAGNOSIS — J9601 Acute respiratory failure with hypoxia: Secondary | ICD-10-CM | POA: Diagnosis not present

## 2017-03-09 DIAGNOSIS — I130 Hypertensive heart and chronic kidney disease with heart failure and stage 1 through stage 4 chronic kidney disease, or unspecified chronic kidney disease: Secondary | ICD-10-CM | POA: Diagnosis not present

## 2017-03-09 DIAGNOSIS — I2510 Atherosclerotic heart disease of native coronary artery without angina pectoris: Secondary | ICD-10-CM | POA: Diagnosis not present

## 2017-03-09 DIAGNOSIS — N183 Chronic kidney disease, stage 3 (moderate): Secondary | ICD-10-CM | POA: Diagnosis not present

## 2017-03-09 LAB — POCT INR: INR: 2.4

## 2017-03-10 ENCOUNTER — Telehealth: Payer: Self-pay | Admitting: Cardiology

## 2017-03-10 DIAGNOSIS — I509 Heart failure, unspecified: Secondary | ICD-10-CM | POA: Diagnosis not present

## 2017-03-10 DIAGNOSIS — I2510 Atherosclerotic heart disease of native coronary artery without angina pectoris: Secondary | ICD-10-CM | POA: Diagnosis not present

## 2017-03-10 DIAGNOSIS — J9601 Acute respiratory failure with hypoxia: Secondary | ICD-10-CM | POA: Diagnosis not present

## 2017-03-10 DIAGNOSIS — N183 Chronic kidney disease, stage 3 (moderate): Secondary | ICD-10-CM | POA: Diagnosis not present

## 2017-03-10 DIAGNOSIS — J449 Chronic obstructive pulmonary disease, unspecified: Secondary | ICD-10-CM | POA: Diagnosis not present

## 2017-03-10 DIAGNOSIS — I130 Hypertensive heart and chronic kidney disease with heart failure and stage 1 through stage 4 chronic kidney disease, or unspecified chronic kidney disease: Secondary | ICD-10-CM | POA: Diagnosis not present

## 2017-03-10 NOTE — Telephone Encounter (Signed)
Faxed HH orders r/t PT/INR and war[facility] dosing [facility]

## 2017-03-13 DIAGNOSIS — I509 Heart failure, unspecified: Secondary | ICD-10-CM | POA: Diagnosis not present

## 2017-03-13 DIAGNOSIS — J449 Chronic obstructive pulmonary disease, unspecified: Secondary | ICD-10-CM | POA: Diagnosis not present

## 2017-03-13 DIAGNOSIS — R0609 Other forms of dyspnea: Secondary | ICD-10-CM | POA: Diagnosis not present

## 2017-03-15 DIAGNOSIS — J449 Chronic obstructive pulmonary disease, unspecified: Secondary | ICD-10-CM | POA: Diagnosis not present

## 2017-03-15 DIAGNOSIS — N183 Chronic kidney disease, stage 3 (moderate): Secondary | ICD-10-CM | POA: Diagnosis not present

## 2017-03-15 DIAGNOSIS — I130 Hypertensive heart and chronic kidney disease with heart failure and stage 1 through stage 4 chronic kidney disease, or unspecified chronic kidney disease: Secondary | ICD-10-CM | POA: Diagnosis not present

## 2017-03-15 DIAGNOSIS — J9601 Acute respiratory failure with hypoxia: Secondary | ICD-10-CM | POA: Diagnosis not present

## 2017-03-15 DIAGNOSIS — I2510 Atherosclerotic heart disease of native coronary artery without angina pectoris: Secondary | ICD-10-CM | POA: Diagnosis not present

## 2017-03-15 DIAGNOSIS — I509 Heart failure, unspecified: Secondary | ICD-10-CM | POA: Diagnosis not present

## 2017-03-16 ENCOUNTER — Ambulatory Visit (INDEPENDENT_AMBULATORY_CARE_PROVIDER_SITE_OTHER): Payer: Medicare Other | Admitting: Pharmacist Clinician (PhC)/ Clinical Pharmacy Specialist

## 2017-03-16 DIAGNOSIS — Z7901 Long term (current) use of anticoagulants: Secondary | ICD-10-CM

## 2017-03-16 DIAGNOSIS — I480 Paroxysmal atrial fibrillation: Secondary | ICD-10-CM

## 2017-03-16 LAB — POCT INR: INR: 2.1

## 2017-03-20 DIAGNOSIS — I2510 Atherosclerotic heart disease of native coronary artery without angina pectoris: Secondary | ICD-10-CM | POA: Diagnosis not present

## 2017-03-20 DIAGNOSIS — I130 Hypertensive heart and chronic kidney disease with heart failure and stage 1 through stage 4 chronic kidney disease, or unspecified chronic kidney disease: Secondary | ICD-10-CM | POA: Diagnosis not present

## 2017-03-20 DIAGNOSIS — J9601 Acute respiratory failure with hypoxia: Secondary | ICD-10-CM | POA: Diagnosis not present

## 2017-03-20 DIAGNOSIS — N183 Chronic kidney disease, stage 3 (moderate): Secondary | ICD-10-CM | POA: Diagnosis not present

## 2017-03-20 DIAGNOSIS — I509 Heart failure, unspecified: Secondary | ICD-10-CM | POA: Diagnosis not present

## 2017-03-20 DIAGNOSIS — J449 Chronic obstructive pulmonary disease, unspecified: Secondary | ICD-10-CM | POA: Diagnosis not present

## 2017-03-21 ENCOUNTER — Other Ambulatory Visit: Payer: Self-pay | Admitting: Cardiology

## 2017-03-29 ENCOUNTER — Ambulatory Visit (INDEPENDENT_AMBULATORY_CARE_PROVIDER_SITE_OTHER): Payer: Medicare Other | Admitting: Pharmacist

## 2017-03-29 DIAGNOSIS — I509 Heart failure, unspecified: Secondary | ICD-10-CM | POA: Diagnosis not present

## 2017-03-29 DIAGNOSIS — J449 Chronic obstructive pulmonary disease, unspecified: Secondary | ICD-10-CM | POA: Diagnosis not present

## 2017-03-29 DIAGNOSIS — N183 Chronic kidney disease, stage 3 (moderate): Secondary | ICD-10-CM | POA: Diagnosis not present

## 2017-03-29 DIAGNOSIS — I130 Hypertensive heart and chronic kidney disease with heart failure and stage 1 through stage 4 chronic kidney disease, or unspecified chronic kidney disease: Secondary | ICD-10-CM | POA: Diagnosis not present

## 2017-03-29 DIAGNOSIS — I2510 Atherosclerotic heart disease of native coronary artery without angina pectoris: Secondary | ICD-10-CM | POA: Diagnosis not present

## 2017-03-29 DIAGNOSIS — J9601 Acute respiratory failure with hypoxia: Secondary | ICD-10-CM | POA: Diagnosis not present

## 2017-03-29 DIAGNOSIS — Z7901 Long term (current) use of anticoagulants: Secondary | ICD-10-CM

## 2017-04-03 ENCOUNTER — Encounter: Payer: Self-pay | Admitting: Cardiology

## 2017-04-03 ENCOUNTER — Ambulatory Visit (INDEPENDENT_AMBULATORY_CARE_PROVIDER_SITE_OTHER): Payer: Medicare Other | Admitting: Cardiology

## 2017-04-03 ENCOUNTER — Ambulatory Visit (INDEPENDENT_AMBULATORY_CARE_PROVIDER_SITE_OTHER): Payer: Medicare Other | Admitting: Pharmacist Clinician (PhC)/ Clinical Pharmacy Specialist

## 2017-04-03 VITALS — BP 163/81 | HR 100 | Ht 72.0 in | Wt 218.8 lb

## 2017-04-03 DIAGNOSIS — E785 Hyperlipidemia, unspecified: Secondary | ICD-10-CM | POA: Diagnosis not present

## 2017-04-03 DIAGNOSIS — I5032 Chronic diastolic (congestive) heart failure: Secondary | ICD-10-CM

## 2017-04-03 DIAGNOSIS — I480 Paroxysmal atrial fibrillation: Secondary | ICD-10-CM

## 2017-04-03 DIAGNOSIS — Z951 Presence of aortocoronary bypass graft: Secondary | ICD-10-CM | POA: Diagnosis not present

## 2017-04-03 DIAGNOSIS — I2510 Atherosclerotic heart disease of native coronary artery without angina pectoris: Secondary | ICD-10-CM | POA: Diagnosis not present

## 2017-04-03 DIAGNOSIS — I4891 Unspecified atrial fibrillation: Secondary | ICD-10-CM | POA: Diagnosis not present

## 2017-04-03 DIAGNOSIS — I714 Abdominal aortic aneurysm, without rupture, unspecified: Secondary | ICD-10-CM

## 2017-04-03 DIAGNOSIS — I10 Essential (primary) hypertension: Secondary | ICD-10-CM | POA: Diagnosis not present

## 2017-04-03 DIAGNOSIS — Z7901 Long term (current) use of anticoagulants: Secondary | ICD-10-CM | POA: Diagnosis not present

## 2017-04-03 LAB — POCT INR: INR: 2

## 2017-04-03 MED ORDER — FUROSEMIDE 40 MG PO TABS
40.0000 mg | ORAL_TABLET | Freq: Every day | ORAL | 8 refills | Status: DC
Start: 2017-04-03 — End: 2018-06-21

## 2017-04-03 NOTE — Progress Notes (Signed)
PCP: Pearson GrippeKim, Chayden, MD  Clinic Note: Chief Complaint  Patient presents with  . Follow-up    And hospital follow-up  . Coronary Artery Disease  . COPD  . Congestive Heart Failure    Diastolic    HPI: Roberto Reilly is a 83 y.o. male with a PMH below who presents today for delayed six-month follow-up for CAD-CABG with red relatively recently diagnosed PAD and PAF.Marland Kitchen. He is a former patient of Dr. Caprice KluverAl Little. He has a history of CABG in 2007 following catheterization catheterization for dyspnea on exertion. He also has long-standing intermittently oxygen requiring COPD.  He has been reluctant to undergo non-invasive ischemic evaluations in the past unless symptoms warrant  .Roberto Reilly was last seen on July 26, 2016 he denied any active cardiac symptoms..  Nothing beyond his baseline exertional/resting dyspnea.  He wears continuous oxygen.  He indicated that his dyspnea was no worse or better than usual.  Dyspnea is made worse with exertion, but he has not noted any change. Stable varicose veins  Recent Hospitalizations:   August 28-September 1: Principal problem was listed as acute diastolic heart failure but likely combination of his baseline oxygen -dependent COPD.  Was treated with IV diuresis  October 2-6: -Acute respiratory failure with hypoxia secondary to COPD again.  Noted wheezing and productive cough as well as swelling.  Was hypoxic, placed on BiPAP.  Treated with nebulizers and steroids--> along with doxycycline.  Discharged on prednisone taper.  IV diuresis discharged on oral Lasix 40 mg twice daily.  Was noted to be in rate controlled A. fib.  Studies Personally Reviewed - (if available, images/films reviewed: From Epic Chart or Care Everywhere)  2D Echocardiogram January 18, 2017: Normal LV size and function.  EF 60-65% - no RWMA.  Mild RA dilation  Interval History: Roberto Reilly returns today for really follow-up visit for hospitalization and baseline follow-up.  He seems to be  doing relatively well.  He is doing really good job monitoring his weight pay attention to his dry weight he has a weight record showing his home scale weights ranging from 210-215 pounds mostly in the 211-212 pound range.  He has set 210 pounds as his home dry weight, but did not fully understand the concept of sliding scale on discharge.  He just had recorded taking 1 dose of additional Lasix on the days of weight going up as opposed to continue to take it until he gets back down to baseline.  He therefore has had a hard time keeping his weight down to 210 pounds, but he has noted this is that this has made a significant impact on his breathing.  He actually is here today without his home oxygen and he seems to be breathing okay but at the end of the conversation seems to be quite winded.  He states that he feels fluttering in his chest but does not really notice any rapid irregular rhythm.  He does not have significant edema or PND but does have some orthopnea.  He has not had any chest tightness or pressure with rest or exertion. No bleeding complications on warfarin. No lightheadedness, dizziness , wooziness syncope/near syncope.,   No TIA/amaurosis fugax symptoms. No melena, hematochezia, hematuria, or epstaxis. No claudication.  ROS: A comprehensive was performed. Review of Systems  Constitutional: Positive for malaise/fatigue (Still tires out quickly.).  HENT: Negative for congestion and nosebleeds.   Respiratory: Positive for cough, shortness of breath and wheezing.  Chronic COPD  Cardiovascular: Positive for leg swelling (Stable edema.  Now wearing support stockings intermittently.). Negative for palpitations.  Musculoskeletal: Positive for joint pain (Hips and knees).  Neurological: Positive for dizziness (Occasionally if he moves quickly.).  Psychiatric/Behavioral: The patient has insomnia (Frequent urination).     I have reviewed and (if needed) personally updated the  patient's problem list, medications, allergies, past medical and surgical history, social and family history.   Past Medical History:  Diagnosis Date  . AAA (abdominal aortic aneurysm) (HCC)    4.2 X 4.8 cm 04/2013 CT; declined re-eval 02/25/15  . Anxiety   . CAD in native artery March 2007   Cath for dyspnea on exertion: 75% distal LM, 85% RI, 95% mid-distal Cx, in multiple RCA lesions with 95% distal. --> Referred for CABG; has declined further noninvasive evaluation in the absence of worsening symptoms  . COPD (chronic obstructive pulmonary disease) with emphysema (HCC) 05/23/2007   Hosp 5/29-6/01/12- COPD exacerbation ONOX 12/08/10- desaturated to less than 88% for over an hour, qualifying for home O2 during sleep    . Diverticulosis of colon with hemorrhage 05/24/2013  . Dyslipidemia, goal LDL below 70   . History of non-ST Elevation MI (myocardial infarction) March 2007   Admitted with COPD exacerbation, given by CHF. Had some chest pain with positive troponins. Cath showed multivessel disease, referred for CABG  . History of pneumonia   . Hypertension   . Hypoxia     chronic, on home O2  . Obesity (BMI 30-39.9)    One year ago weighed 265 pounds, (12/2012) -- now 234 pounds  . PAF (paroxysmal atrial fibrillation) (HCC)    Anticoagulated on warfarin. Stable -- post op  . Pneumonia    hx  . 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 Seasonal allergies   . Shortness of breath dyspnea     Past Surgical History:  Procedure Laterality Date  . CARDIAC CATHETERIZATION  03 28 2007   NORMAL LV FUNCTION/ ABDOMINAL AORTA STENOSIS,75%-85%. RIGHT FEMORAL ARTERY :CATHETERS USED A  4-FRENCH WITH A 4-FRENCH SHEATH  . CATARACT EXTRACTION     Laser  . CORONARY ARTERY BYPASS GRAFT    . RLL resection for Hamartoma     lungs  . RUL for hamartoma     lung  . TRANSTHORACIC ECHOCARDIOGRAM  12/2016   Normal LV size and function.  EF 60-65% - no RWMA.  Mild RA dilation     Current Meds  Medication Sig  . amLODipine (NORVASC) 10 MG tablet Take 1 tablet (10 mg total) by mouth daily.  Marland Kitchen. aspirin EC 81 MG tablet Take 81 mg by mouth every morning.   Marland Kitchen. atorvastatin (LIPITOR) 40 MG tablet Take 1 tablet (40 mg total) by mouth daily.  . budesonide-formoterol (SYMBICORT) 160-4.5 MCG/ACT inhaler Inhale 2 puffs into the lungs 2 (two) times daily.  . cholecalciferol (VITAMIN D) 1000 UNITS tablet Take 1,000 Units by mouth every morning.   Marland Kitchen. ipratropium-albuterol (DUONEB) 0.5-2.5 (3) MG/3ML SOLN Take 3 mLs by nebulization every 6 (six) hours as needed (wheezing and shortness of breath).   . OXYGEN Inhale 3 L into the lungs as needed (shortness of breath).   . potassium chloride SA (K-DUR,KLOR-CON) 20 MEQ tablet TAKE 1 TABLET (20 MEQ TOTAL) BY MOUTH DAILY.  Marland Kitchen. warfarin (COUMADIN) 2.5 MG tablet TAKE 1-2 TABLETS BY MOUTH DAILY AS DIRECTED BY COUMADIN CL[facility]  . [DISCONTINUED] furosemide (LASIX) 40 MG tablet Take  1 tablet (40 mg total) by mouth 2 (two) times daily.  . [DISCONTINUED] predniSONE (DELTASONE) 10 MG tablet Label  & dispense according to the schedule below. 4 Pills PO on day one then, 3 Pills PO on day two, 2 Pills PO on day three, 1Pill PO on day four,  then STOP.  Total of 10 tabs    Allergies  Allergen Reactions  . Prednisone Other (See Comments)    Makes pt jittery and nervous    Social History   Socioeconomic History  . Marital status: Married    Spouse name: None  . Number of children: 2  . Years of education: None  . Highest education level: None  Social Needs  . Financial resource strain: None  . Food insecurity - worry: None  . Food insecurity - inability: None  . Transportation needs - medical: None  . Transportation needs - non-medical: None  Occupational History  . Occupation: Physicist, medical  Tobacco Use  . Smoking status: Former Smoker    Packs/day: 2.00    Years: 55.00    Pack years: 110.00    Types: Cigarettes    Last attempt to  quit: 05/23/1998    Years since quitting: 18.8  . Smokeless tobacco: Never Used  Substance and Sexual Activity  . Alcohol use: No  . Drug use: No  . Sexual activity: None  Other Topics Concern  . None  Social History Narrative   He is a married father of 2, grandfather of 55, does not get routine exercise. He is a former smoker, but does not currently or not drink alcohol.    He is modifying his diet and trying to get activity but is doing better with the diet than the activity.    He is under a lot of stress.    He was the caregiver for his wife, who was in poor health. She died 10/26/2015.    family history includes Heart attack in his father and mother.  Wt Readings from Last 3 Encounters:  04/03/17 218 lb 12.8 oz (99.2 kg)  02/25/17 217 lb 1.6 oz (98.5 kg)  01/21/17 213 lb 3.2 oz (96.7 kg)    PHYSICAL EXAM BP (!) 163/81   Pulse 100   Ht 6' (1.829 m)   Wt 218 lb 12.8 oz (99.2 kg)   BMI 29.67 kg/m  Physical Exam  Constitutional: He is oriented to person, place, and time. He appears well-developed and well-nourished. No distress (No immediate distress, just baseline chronically ill-appearing.  He sits in a tripod position with increased work of breathing and mild expiratory wheezes.  Relatively stable.).  HENT:  Head: Normocephalic and atraumatic.  Neck: Normal range of motion. Neck supple. No hepatojugular reflux and no JVD present. Carotid bruit is not present.  Cardiovascular: Normal rate, regular rhythm and normal pulses.  No extrasystoles are present. Exam reveals gallop, S4 and distant heart sounds.  Pulmonary/Chest:  Barrel chested with increased AP diameter.  Prolonged extremity phase.  Diffuse upper airway wheezing and rhonchi, no rales.  Abdominal: Soft. Bowel sounds are normal. He exhibits no distension. There is no tenderness. There is no rebound.  Musculoskeletal: Normal range of motion. He exhibits no edema.  Neurological: He is alert and oriented to person,  place, and time.  Skin: Skin is warm and dry.  Psychiatric: He has a normal mood and affect. His behavior is normal. Judgment and thought content normal.    Adult ECG Report Not checked  Other studies Reviewed:  Additional studies/ records that were reviewed today include:  Recent Labs: Labs are followed by PCP. Lab Results  Component Value Date   CREATININE 1.34 (H) 02/25/2017   BUN 49 (H) 02/25/2017   NA 136 02/25/2017   K 4.9 02/25/2017   CL 99 (L) 02/25/2017   CO2 28 02/25/2017    ASSESSMENT / PLAN: Problem List Items Addressed This Visit    AAA (abdominal aortic aneurysm) without rupture seen on CT scan (Chronic)    Unfortunately, he declined further evaluation.  Still not interested in abdominal aortic evaluation or further ower extremity arterial evaluation.      Relevant Medications   furosemide (LASIX) 40 MG tablet   CAD in native artery - LM, RCA, RI & Cx disease --> CABG x 4 (LIMA-LAD, SVG-OM/RI, SVG-rPDA-rPL) (Chronic)    He has known multivessel disease and was referred for CABG.  No active angina symptoms. He has declined any ischemic evaluation in the absence of significant change in symptoms.  His echocardiogram is remain the same with no regional wall motion suggest stable coronary disease.  Not on beta-blocker due to COPD.  He is on aspirin statin and amlodipine.      Relevant Medications   furosemide (LASIX) 40 MG tablet   Chronic diastolic CHF (congestive heart failure), NYHA class 2 (HCC) - Primary (Chronic)    Clearly he does not have systolic heart failure, and probably does have some component of diastolic heart failure in the setting of atrial fibrillation and COPD.  We need to keep his A. fib is rate controlled as possible and therefore we will continue to monitor for now but low threshold for converting to oral diltiazem. He is on a standing dose of Lasix and we discussed sliding scale Lasix recommendations per instructions below: Sliding scale Lasix:  Weigh yourself when you get home, then Daily in the Morning. Your dry weight will be what your scale says on the day you return home.(here is 210 lbs.).   If you gain more than 3 pounds( above or equal to 213 lbs) from dry weight: Increase the Lasix dosing to 80 mg in the morning  until weight returns to baseline dry weight.   If the weight goes down more than 3 pounds from dry weight: Hold Lasix until it returns to baseline dry weight      Relevant Medications   furosemide (LASIX) 40 MG tablet   Dyslipidemia, goal LDL below 70 (Chronic)    On statin.  Converted to atorvastatin.  Labs checked by PCP, not readily available.      Relevant Medications   furosemide (LASIX) 40 MG tablet   Essential hypertension (Chronic)    Blood pressure seems a bit high today on amlodipine alone.  He is also standard of Lasix.  I am again reluctant to push too hard on blood pressure control, we need to monitor this and potentially consider increasing afterload reduction.      Relevant Medications   furosemide (LASIX) 40 MG tablet   Paroxysmal atrial fibrillation (HCC); CHA2DS2-VASc Score = 4. On Warfarin (Chronic)    On my exam he seems to be in sinus rhythm versus very slow A. fib.  Has not had rapid A. fib episodes.  He remains on warfarin for anticoagulation. As previously noted, if he does need rate control, we could switch from amlodipine to diltiazem.      Relevant Medications   furosemide (LASIX) 40 MG tablet   S/P CABG x 4 (Chronic)  Per his request, not pursuing screening stress test in the absence of significant symptoms         Current medicines are reviewed at length with the patient today. (+/- concerns) not sure how to do sliding scale Lasix The following changes have been made:See below  Patient Instructions  Medication instruction Sliding scale Lasix: Weigh yourself when you get home, then Daily in the Morning. Your dry weight will be what your scale says on the day you  return home.(here is 210 lbs.).   If you gain more than 3 pounds( above or equal to 213 lbs) from dry weight: Increase the Lasix dosing to 80 mg in the morning  until weight returns to baseline dry weight.   If the weight goes down more than 3 pounds from dry weight: Hold Lasix until it returns to baseline dry weight     No other changes       Your physician wants you to follow-up in 6 months with DR Salman Wellen. You will receive a reminder letter in the mail two months in advance. If you don't receive a letter, please call our office to schedule the follow-up appointment.   If you need a refill on your cardiac medications before your next appointment, please call your pharmacy.    Studies Ordered:   No orders of the defined types were placed in this encounter.     Bryan Lemma, M.D., M.S. Interventional Cardiologist   Pager # [phone] Phone # [phone] [address] [place], [place] [address]

## 2017-04-03 NOTE — Patient Instructions (Addendum)
Medication instruction Sliding scale Lasix: Weigh yourself when you get home, then Daily in the Morning. Your dry weight will be what your scale says on the day you return home.(here is 210 lbs.).   If you gain more than 3 pounds( above or equal to 213 lbs) from dry weight: Increase the Lasix dosing to 80 mg in the morning  until weight returns to baseline dry weight.   If the weight goes down more than 3 pounds from dry weight: Hold Lasix until it returns to baseline dry weight     No other changes       Your physician wants you to follow-up in 6 months with DR HARDING. You will receive a reminder letter in the mail two months in advance. If you don't receive a letter, please call our office to schedule the follow-up appointment.   If you need a refill on your cardiac medications before your next appointment, please call your pharmacy.

## 2017-04-05 ENCOUNTER — Encounter: Payer: Self-pay | Admitting: Cardiology

## 2017-04-05 NOTE — Assessment & Plan Note (Signed)
Blood pressure seems a bit high today on amlodipine alone.  He is also standard of Lasix.  I am again reluctant to push too hard on blood pressure control, we need to monitor this and potentially consider increasing afterload reduction.

## 2017-04-05 NOTE — Assessment & Plan Note (Signed)
He has known multivessel disease and was referred for CABG.  No active angina symptoms. He has declined any ischemic evaluation in the absence of significant change in symptoms.  His echocardiogram is remain the same with no regional wall motion suggest stable coronary disease.  Not on beta-blocker due to COPD.  He is on aspirin statin and amlodipine.

## 2017-04-05 NOTE — Assessment & Plan Note (Addendum)
On statin.  Converted to atorvastatin.  Labs checked by PCP, not readily available.

## 2017-04-05 NOTE — Assessment & Plan Note (Signed)
Per his request, not pursuing screening stress test in the absence of significant symptoms

## 2017-04-05 NOTE — Assessment & Plan Note (Signed)
On my exam he seems to be in sinus rhythm versus very slow A. fib.  Has not had rapid A. fib episodes.  He remains on warfarin for anticoagulation. As previously noted, if he does need rate control, we could switch from amlodipine to diltiazem.

## 2017-04-05 NOTE — Assessment & Plan Note (Signed)
Clearly he does not have systolic heart failure, and probably does have some component of diastolic heart failure in the setting of atrial fibrillation and COPD.  We need to keep his A. fib is rate controlled as possible and therefore we will continue to monitor for now but low threshold for converting to oral diltiazem. He is on a standing dose of Lasix and we discussed sliding scale Lasix recommendations per instructions below: Sliding scale Lasix: Weigh yourself when you get home, then Daily in the Morning. Your dry weight will be what your scale says on the day you return home.(here is 210 lbs.).   If you gain more than 3 pounds( above or equal to 213 lbs) from dry weight: Increase the Lasix dosing to 80 mg in the morning  until weight returns to baseline dry weight.   If the weight goes down more than 3 pounds from dry weight: Hold Lasix until it returns to baseline dry weight

## 2017-04-05 NOTE — Assessment & Plan Note (Signed)
Unfortunately, he declined further evaluation.  Still not interested in abdominal aortic evaluation or further ower extremity arterial evaluation.

## 2017-04-06 DIAGNOSIS — J449 Chronic obstructive pulmonary disease, unspecified: Secondary | ICD-10-CM | POA: Diagnosis not present

## 2017-04-06 DIAGNOSIS — I509 Heart failure, unspecified: Secondary | ICD-10-CM | POA: Diagnosis not present

## 2017-04-06 DIAGNOSIS — I130 Hypertensive heart and chronic kidney disease with heart failure and stage 1 through stage 4 chronic kidney disease, or unspecified chronic kidney disease: Secondary | ICD-10-CM | POA: Diagnosis not present

## 2017-04-06 DIAGNOSIS — N183 Chronic kidney disease, stage 3 (moderate): Secondary | ICD-10-CM | POA: Diagnosis not present

## 2017-04-06 DIAGNOSIS — J9601 Acute respiratory failure with hypoxia: Secondary | ICD-10-CM | POA: Diagnosis not present

## 2017-04-06 DIAGNOSIS — I2510 Atherosclerotic heart disease of native coronary artery without angina pectoris: Secondary | ICD-10-CM | POA: Diagnosis not present

## 2017-04-14 DIAGNOSIS — I2510 Atherosclerotic heart disease of native coronary artery without angina pectoris: Secondary | ICD-10-CM | POA: Diagnosis not present

## 2017-04-14 DIAGNOSIS — J449 Chronic obstructive pulmonary disease, unspecified: Secondary | ICD-10-CM | POA: Diagnosis not present

## 2017-04-14 DIAGNOSIS — I130 Hypertensive heart and chronic kidney disease with heart failure and stage 1 through stage 4 chronic kidney disease, or unspecified chronic kidney disease: Secondary | ICD-10-CM | POA: Diagnosis not present

## 2017-04-14 DIAGNOSIS — N183 Chronic kidney disease, stage 3 (moderate): Secondary | ICD-10-CM | POA: Diagnosis not present

## 2017-04-14 DIAGNOSIS — I509 Heart failure, unspecified: Secondary | ICD-10-CM | POA: Diagnosis not present

## 2017-04-14 DIAGNOSIS — J9601 Acute respiratory failure with hypoxia: Secondary | ICD-10-CM | POA: Diagnosis not present

## 2017-04-19 ENCOUNTER — Telehealth: Payer: Self-pay | Admitting: Cardiology

## 2017-04-19 DIAGNOSIS — J449 Chronic obstructive pulmonary disease, unspecified: Secondary | ICD-10-CM | POA: Diagnosis not present

## 2017-04-19 DIAGNOSIS — I509 Heart failure, unspecified: Secondary | ICD-10-CM | POA: Diagnosis not present

## 2017-04-19 DIAGNOSIS — I2510 Atherosclerotic heart disease of native coronary artery without angina pectoris: Secondary | ICD-10-CM | POA: Diagnosis not present

## 2017-04-19 DIAGNOSIS — I130 Hypertensive heart and chronic kidney disease with heart failure and stage 1 through stage 4 chronic kidney disease, or unspecified chronic kidney disease: Secondary | ICD-10-CM | POA: Diagnosis not present

## 2017-04-19 DIAGNOSIS — N183 Chronic kidney disease, stage 3 (moderate): Secondary | ICD-10-CM | POA: Diagnosis not present

## 2017-04-19 DIAGNOSIS — J9601 Acute respiratory failure with hypoxia: Secondary | ICD-10-CM | POA: Diagnosis not present

## 2017-04-19 NOTE — Telephone Encounter (Signed)
Called Patti back. No answer and unable to leave message (mailbox full).  Next INR check due 05/11/2017 but patient has appt to come to clinic.   Will try again tomorrow morning.

## 2017-04-19 NOTE — Telephone Encounter (Signed)
Routed to pharmD for review. 

## 2017-04-19 NOTE — Telephone Encounter (Signed)
She needs an order for a PT,INR at home next week.

## 2017-04-20 NOTE — Telephone Encounter (Signed)
[facility] if full and unable to leave message

## 2017-04-25 ENCOUNTER — Telehealth: Payer: Self-pay | Admitting: Cardiology

## 2017-04-25 DIAGNOSIS — I509 Heart failure, unspecified: Secondary | ICD-10-CM | POA: Diagnosis not present

## 2017-04-25 NOTE — Telephone Encounter (Signed)
[facility] calling to get oder fot PT INR for pt-pls call [phone]

## 2017-04-25 NOTE — Telephone Encounter (Signed)
[place]ed call to [place] with [facility].Stated she will be going out to patient's home 04/28/17.Stated she will do a INR and fax results to us.Stated 12/7 will be her last day going out to patient's home.Advised I will let our pharmacist know you will fax INR results to us.

## 2017-04-28 ENCOUNTER — Ambulatory Visit (INDEPENDENT_AMBULATORY_CARE_PROVIDER_SITE_OTHER): Payer: Medicare Other | Admitting: Pharmacist

## 2017-04-28 DIAGNOSIS — N183 Chronic kidney disease, stage 3 (moderate): Secondary | ICD-10-CM | POA: Diagnosis not present

## 2017-04-28 DIAGNOSIS — J449 Chronic obstructive pulmonary disease, unspecified: Secondary | ICD-10-CM | POA: Diagnosis not present

## 2017-04-28 DIAGNOSIS — Z7901 Long term (current) use of anticoagulants: Secondary | ICD-10-CM | POA: Diagnosis not present

## 2017-04-28 DIAGNOSIS — I130 Hypertensive heart and chronic kidney disease with heart failure and stage 1 through stage 4 chronic kidney disease, or unspecified chronic kidney disease: Secondary | ICD-10-CM | POA: Diagnosis not present

## 2017-04-28 DIAGNOSIS — I2510 Atherosclerotic heart disease of native coronary artery without angina pectoris: Secondary | ICD-10-CM | POA: Diagnosis not present

## 2017-04-28 DIAGNOSIS — J9601 Acute respiratory failure with hypoxia: Secondary | ICD-10-CM | POA: Diagnosis not present

## 2017-04-28 DIAGNOSIS — I509 Heart failure, unspecified: Secondary | ICD-10-CM | POA: Diagnosis not present

## 2017-04-28 LAB — POCT INR: INR: 2.1

## 2017-05-29 ENCOUNTER — Other Ambulatory Visit: Payer: Self-pay | Admitting: Cardiology

## 2017-05-31 NOTE — Telephone Encounter (Signed)
Overdue for INR check. Waiting for appt on 06/01/17

## 2017-06-01 ENCOUNTER — Ambulatory Visit (INDEPENDENT_AMBULATORY_CARE_PROVIDER_SITE_OTHER): Payer: Medicare Other | Admitting: Pharmacist Clinician (PhC)/ Clinical Pharmacy Specialist

## 2017-06-01 DIAGNOSIS — I4891 Unspecified atrial fibrillation: Secondary | ICD-10-CM

## 2017-06-01 DIAGNOSIS — Z7901 Long term (current) use of anticoagulants: Secondary | ICD-10-CM | POA: Diagnosis not present

## 2017-06-01 DIAGNOSIS — I480 Paroxysmal atrial fibrillation: Secondary | ICD-10-CM

## 2017-06-01 LAB — POCT INR: INR: 1.7

## 2017-06-01 NOTE — Patient Instructions (Signed)
Description   Take 2 tablets today Thursday Jan 10, then continue with 1 tablet daily except 2 tablets each Monday, Wednesday and Friday.  Repeat INR in 4 weeks

## 2017-06-07 ENCOUNTER — Ambulatory Visit (INDEPENDENT_AMBULATORY_CARE_PROVIDER_SITE_OTHER): Payer: Medicare Other | Admitting: Internal Medicine

## 2017-06-07 ENCOUNTER — Encounter: Payer: Self-pay | Admitting: Internal Medicine

## 2017-06-07 DIAGNOSIS — J9621 Acute and chronic respiratory failure with hypoxia: Secondary | ICD-10-CM

## 2017-06-07 DIAGNOSIS — J449 Chronic obstructive pulmonary disease, unspecified: Secondary | ICD-10-CM | POA: Diagnosis not present

## 2017-06-07 NOTE — Patient Instructions (Signed)
We continue O2 3L/ minute continuously  Ok to continue neb solution ipratropium-albuterol  Sample x 3 Symbicort 160  Please call if we can help

## 2017-06-07 NOTE — Progress Notes (Signed)
Patient ID: Roberto Reilly, male    DOB: 13-Jan-1934, 84 y.o.   MRN: [id number]  HPI male former smoker followed for COPD, complicated by CAD, diastolic heart failure, PAF/chronic anticoagulation, HBP, obesity, old right thoracotomy Office Spirometry 11/30/16-very severe obstruction-FVC 1.61/37%, FEV1 0.79/26%, ratio 0.49  --------------------------------------------------  11/30/16- 84 year old male former smoker followed for COPD, complicated by CAD/CABG, diastolic heart failure, PAF/chronic anticoagulation/ Warfarin, HBP, obesity, old right thoracotomy, CKD III O2 2 L/[facility]     --- requalified today FOLLOWS FOR: WUJ:[id number] Pt continues to wear O2 with exertion. Continues to use nebulizer BID at least to help with SOB and wheezing.  Cough is productive 2 or 3 times a day, right after he uses his nebulizer machine. Pattern is unchanged. Sputum is white. Denies chest pain or palpitation. No acute events. Always short on funding for meds. Office Spirometry 11/30/16-very severe obstruction-FVC 1.61/37%, FEV1 0.79/26%, ratio 0.49  06/07/17- 84 year old male former smoker followed for COPD, complicated by CAD/CABG, diastolic heart failure, PAF/chronic anticoagulation/ Warfarin, HBP, obesity, old right thoracotomy, CKD III O2 2 L/[facility]  Nebulizer machine [employer]/[facility] ----FOLLOWS FOR: COPD >> Breathing is worse since last OV. Reports SOB. Denies chest tightness, wheezing or coughing. Widowed now.  On Saturdays.  Admits he is depressed and feels he is slowly running down.  Did have flu shot. Hard for him to walk to mailbox.  He depends on [facility] for transport. CXR 02/21/17- IMPRESSION: 1. Small right pleural effusion with probable subsegmental atelectasis in the right lower lobe. 2. Mild cardiomegaly. 3. Aortic atherosclerosis.  Review of Systems- see HPI   + = pos Constitutional:   No-   weight loss, night sweats, fevers, chills, fatigue, lassitude. HEENT:   No-   headaches, difficulty  swallowing, tooth/dental problems, sore throat,                  No-   sneezing, itching, ear ache,+ nasal congestion, post nasal drip,  CV:  No-   chest pain, orthopnea, PND, swelling in lower extremities, anasarca, dizziness, palpitations GI:  No-   heartburn, indigestion, abdominal pain, nausea, vomiting,  Resp: , per HPI, +Chronic cough              No-  coughing up of blood.               change in color of mucus.  +occasional wheezing.   Skin: No-   rash or lesions. GU: . MS:  No-   joint pain or swelling.   Psych:  + change in mood or affect. + depression or anxiety.  No memory loss.   Objective:   Physical Exam    O2 3L General- Alert, Oriented, Affect-appropriate, Distress- none acute   + Overweight., + Tripod posture, +pursed lips  Skin- . + coumadin bruising Lymphadenopathy- none Head- atraumatic            Eyes- Gross vision intact, PERRLA, conjunctivae clear secretions            Ears- + Hard of hearing            Nose- Clear, No-Septal dev, mucus, polyps, erosion, perforation             Throat- Mallampati II , mucosa clear , drainage- none, tonsils- atrophic, +dentures, +hoarse Neck- flexible , trachea midline, no stridor , thyroid nl, carotid no bruit Chest - symmetrical excursion , unlabored           Heart/CV-+ IRR( no pacer) , no murmur ,  no gallop  , no rub, nl s1 s2                           - JVD- none , edema- none, stasis changes- none, varices- none           Lung- + diminished breath sounds,  wheeze + trace at apex , no- cough, dullness-none, rub- none   unlabored,            Chest wall- sternal scar Abd-  Br/ Gen/ Rectal- Not done, not indicated Extrem- + external varices on legs,  + stasis changes Neuro- grossly intact to observation

## 2017-06-28 ENCOUNTER — Ambulatory Visit (INDEPENDENT_AMBULATORY_CARE_PROVIDER_SITE_OTHER): Payer: Medicare Other | Admitting: Pharmacist

## 2017-06-28 DIAGNOSIS — I4891 Unspecified atrial fibrillation: Secondary | ICD-10-CM

## 2017-06-28 DIAGNOSIS — Z7901 Long term (current) use of anticoagulants: Secondary | ICD-10-CM

## 2017-06-28 LAB — POCT INR: INR: 2

## 2017-06-28 NOTE — Patient Instructions (Signed)
Blood pressure today 132/78

## 2017-07-05 ENCOUNTER — Other Ambulatory Visit: Payer: Self-pay

## 2017-07-05 ENCOUNTER — Emergency Department (HOSPITAL_COMMUNITY): Payer: Medicare Other

## 2017-07-05 ENCOUNTER — Encounter (HOSPITAL_COMMUNITY): Payer: Self-pay

## 2017-07-05 ENCOUNTER — Observation Stay (HOSPITAL_COMMUNITY)
Admission: EM | Admit: 2017-07-05 | Discharge: 2017-07-06 | Disposition: A | Discharge status: Home / Self Care | Payer: Medicare Other | Attending: Internal Medicine | Admitting: Internal Medicine

## 2017-07-05 DIAGNOSIS — Z7901 Long term (current) use of anticoagulants: Secondary | ICD-10-CM

## 2017-07-05 DIAGNOSIS — J9620 Acute and chronic respiratory failure, unspecified whether with hypoxia or hypercapnia: Secondary | ICD-10-CM | POA: Diagnosis present

## 2017-07-05 DIAGNOSIS — I10 Essential (primary) hypertension: Secondary | ICD-10-CM | POA: Diagnosis present

## 2017-07-05 DIAGNOSIS — N183 Chronic kidney disease, stage 3 (moderate): Secondary | ICD-10-CM | POA: Insufficient documentation

## 2017-07-05 DIAGNOSIS — Z87891 Personal history of nicotine dependence: Secondary | ICD-10-CM | POA: Diagnosis not present

## 2017-07-05 DIAGNOSIS — R069 Unspecified abnormalities of breathing: Secondary | ICD-10-CM | POA: Diagnosis not present

## 2017-07-05 DIAGNOSIS — F411 Generalized anxiety disorder: Secondary | ICD-10-CM | POA: Diagnosis present

## 2017-07-05 DIAGNOSIS — I2510 Atherosclerotic heart disease of native coronary artery without angina pectoris: Secondary | ICD-10-CM | POA: Diagnosis present

## 2017-07-05 DIAGNOSIS — I482 Chronic atrial fibrillation, unspecified: Secondary | ICD-10-CM

## 2017-07-05 DIAGNOSIS — Z951 Presence of aortocoronary bypass graft: Secondary | ICD-10-CM | POA: Diagnosis not present

## 2017-07-05 DIAGNOSIS — I714 Abdominal aortic aneurysm, without rupture, unspecified: Secondary | ICD-10-CM | POA: Diagnosis present

## 2017-07-05 DIAGNOSIS — Z79899 Other long term (current) drug therapy: Secondary | ICD-10-CM | POA: Insufficient documentation

## 2017-07-05 DIAGNOSIS — I480 Paroxysmal atrial fibrillation: Secondary | ICD-10-CM | POA: Diagnosis present

## 2017-07-05 DIAGNOSIS — I130 Hypertensive heart and chronic kidney disease with heart failure and stage 1 through stage 4 chronic kidney disease, or unspecified chronic kidney disease: Secondary | ICD-10-CM | POA: Insufficient documentation

## 2017-07-05 DIAGNOSIS — Z7982 Long term (current) use of aspirin: Secondary | ICD-10-CM | POA: Insufficient documentation

## 2017-07-05 DIAGNOSIS — I5032 Chronic diastolic (congestive) heart failure: Secondary | ICD-10-CM | POA: Diagnosis not present

## 2017-07-05 DIAGNOSIS — J441 Chronic obstructive pulmonary disease with (acute) exacerbation: Principal | ICD-10-CM | POA: Diagnosis present

## 2017-07-05 DIAGNOSIS — R05 Cough: Secondary | ICD-10-CM | POA: Diagnosis not present

## 2017-07-05 DIAGNOSIS — R0602 Shortness of breath: Secondary | ICD-10-CM | POA: Diagnosis not present

## 2017-07-05 DIAGNOSIS — E785 Hyperlipidemia, unspecified: Secondary | ICD-10-CM | POA: Diagnosis present

## 2017-07-05 LAB — CBC WITH DIFFERENTIAL/PLATELET
Basophils Absolute: 0 10*3/uL (ref 0.0–0.1)
Basophils Relative: 0 %
Eosinophils Absolute: 0.1 10*3/uL (ref 0.0–0.7)
Eosinophils Relative: 2 %
HCT: 45.4 % (ref 39.0–52.0)
Hemoglobin: 14.9 g/dL (ref 13.0–17.0)
LYMPHS ABS: 1.8 10*3/uL (ref 0.7–4.0)
LYMPHS PCT: 22 %
MCH: 29.7 pg (ref 26.0–34.0)
MCHC: 32.8 g/dL (ref 30.0–36.0)
MCV: 90.6 fL (ref 78.0–100.0)
MONO ABS: 0.7 10*3/uL (ref 0.1–1.0)
MONOS PCT: 9 %
Neutro Abs: 5.4 10*3/uL (ref 1.7–7.7)
Neutrophils Relative %: 67 %
Platelets: 178 10*3/uL (ref 150–400)
RBC: 5.01 MIL/uL (ref 4.22–5.81)
RDW: 13.9 % (ref 11.5–15.5)
WBC: 8.1 10*3/uL (ref 4.0–10.5)

## 2017-07-05 LAB — COMPREHENSIVE METABOLIC PANEL
ALBUMIN: 4.4 g/dL (ref 3.5–5.0)
ALT: 15 U/L — ABNORMAL LOW (ref 17–63)
ANION GAP: 13 (ref 5–15)
AST: 23 U/L (ref 15–41)
Alkaline Phosphatase: 79 U/L (ref 38–126)
BILIRUBIN TOTAL: 0.8 mg/dL (ref 0.3–1.2)
BUN: 21 mg/dL — AB (ref 6–20)
CO2: 27 mmol/L (ref 22–32)
Calcium: 9.1 mg/dL (ref 8.9–10.3)
Chloride: 104 mmol/L (ref 101–111)
Creatinine, Ser: 1.23 mg/dL (ref 0.61–1.24)
GFR calc Af Amer: >60 mL/min (ref >60)
GFR calc non Af Amer: 53 mL/min — ABNORMAL LOW (ref >60)
GLUCOSE: 105 mg/dL — AB (ref 65–99)
POTASSIUM: 4.5 mmol/L (ref 3.5–5.1)
SODIUM: 144 mmol/L (ref 135–145)
TOTAL PROTEIN: 7.3 g/dL (ref 6.5–8.1)

## 2017-07-05 LAB — PROTIME-INR
INR: 2.21
Prothrombin Time: 24.3 seconds — ABNORMAL HIGH (ref 11.4–15.2)

## 2017-07-05 LAB — URINALYSIS, ROUTINE W REFLEX MICROSCOPIC
BILIRUBIN URINE: NEGATIVE
Glucose, UA: NEGATIVE mg/dL
HGB URINE DIPSTICK: NEGATIVE
Ketones, ur: NEGATIVE mg/dL
Leukocytes, UA: NEGATIVE
Nitrite: NEGATIVE
PH: 6 (ref 5.0–8.0)
Protein, ur: NEGATIVE mg/dL
SPECIFIC GRAVITY, URINE: 1.01 (ref 1.005–1.030)

## 2017-07-05 LAB — INFLUENZA PANEL BY PCR (TYPE A & B)
INFLAPCR: NEGATIVE
Influenza B By PCR: NEGATIVE

## 2017-07-05 LAB — TROPONIN I: Troponin I: <0.03 ng/mL (ref <0.03)

## 2017-07-05 LAB — BRAIN NATRIURETIC PEPTIDE: B Natriuretic Peptide: 156.2 pg/mL — ABNORMAL HIGH (ref 0.0–100.0)

## 2017-07-05 MED ORDER — ACETAMINOPHEN 325 MG PO TABS
650.0000 mg | ORAL_TABLET | Freq: Four times a day (QID) | ORAL | Status: DC | PRN
Start: 2017-07-05 — End: 2017-07-06

## 2017-07-05 MED ORDER — WARFARIN SODIUM 2.5 MG PO TABS
2.5000 mg | ORAL_TABLET | ORAL | Status: DC
  Administered 2017-07-06: 2.5 mg via ORAL
  Filled 2017-07-05: qty 1

## 2017-07-05 MED ORDER — ATORVASTATIN CALCIUM 40 MG PO TABS
40.0000 mg | ORAL_TABLET | Freq: Every day | ORAL | Status: DC
  Administered 2017-07-06: 40 mg via ORAL
  Filled 2017-07-05: qty 1

## 2017-07-05 MED ORDER — POLYETHYLENE GLYCOL 3350 17 G PO PACK
17.0000 g | PACK | Freq: Every day | ORAL | Status: DC | PRN
Start: 2017-07-05 — End: 2017-07-06

## 2017-07-05 MED ORDER — METHYLPREDNISOLONE SODIUM SUCC 40 MG IJ SOLR
40.0000 mg | Freq: Two times a day (BID) | INTRAMUSCULAR | Status: AC
  Administered 2017-07-05 – 2017-07-06 (×2): 40 mg via INTRAVENOUS
  Filled 2017-07-05 (×2): qty 1

## 2017-07-05 MED ORDER — SODIUM CHLORIDE 0.9 % IV SOLN
1.0000 g | INTRAVENOUS | Status: DC
  Administered 2017-07-05 – 2017-07-06 (×2): 1 g via INTRAVENOUS
  Filled 2017-07-05 (×2): qty 1

## 2017-07-05 MED ORDER — WARFARIN SODIUM 2.5 MG PO TABS: 2.5000 mg | ORAL_TABLET | ORAL | Status: DC

## 2017-07-05 MED ORDER — WARFARIN SODIUM 5 MG PO TABS: 5.0000 mg | ORAL_TABLET | ORAL | Status: DC

## 2017-07-05 MED ORDER — WARFARIN - PHYSICIAN DOSING INPATIENT: Freq: Every day | Status: DC

## 2017-07-05 MED ORDER — AZITHROMYCIN 250 MG PO TABS
250.0000 mg | ORAL_TABLET | Freq: Every day | ORAL | Status: DC
  Administered 2017-07-06: 250 mg via ORAL
  Filled 2017-07-05: qty 1

## 2017-07-05 MED ORDER — AMLODIPINE BESYLATE 10 MG PO TABS
10.0000 mg | ORAL_TABLET | Freq: Every day | ORAL | Status: DC
  Administered 2017-07-06: 10 mg via ORAL
  Filled 2017-07-05: qty 1

## 2017-07-05 MED ORDER — MOMETASONE FURO-FORMOTEROL FUM 200-5 MCG/ACT IN AERO
2.0000 | INHALATION_SPRAY | Freq: Two times a day (BID) | RESPIRATORY_TRACT | Status: DC
  Administered 2017-07-05: 2 via RESPIRATORY_TRACT
  Filled 2017-07-05: qty 8.8

## 2017-07-05 MED ORDER — SODIUM CHLORIDE 0.9% FLUSH: 3.0000 mL | INTRAVENOUS | Status: DC | PRN

## 2017-07-05 MED ORDER — VITAMIN D3 25 MCG (1000 UNIT) PO TABS
1000.0000 [IU] | ORAL_TABLET | Freq: Every day | ORAL | Status: DC
  Administered 2017-07-06: 1000 [IU] via ORAL
  Filled 2017-07-05: qty 1

## 2017-07-05 MED ORDER — SODIUM CHLORIDE 0.9% FLUSH
3.0000 mL | Freq: Two times a day (BID) | INTRAVENOUS | Status: DC
  Administered 2017-07-05: 3 mL via INTRAVENOUS

## 2017-07-05 MED ORDER — SODIUM CHLORIDE 0.9 % IV SOLN: 250.0000 mL | INTRAVENOUS | Status: DC | PRN

## 2017-07-05 MED ORDER — AZITHROMYCIN 250 MG PO TABS
500.0000 mg | ORAL_TABLET | Freq: Once | ORAL | Status: AC
  Administered 2017-07-05: 500 mg via ORAL
  Filled 2017-07-05: qty 2

## 2017-07-05 MED ORDER — PREDNISONE 20 MG PO TABS
40.0000 mg | ORAL_TABLET | Freq: Every day | ORAL | Status: DC
  Administered 2017-07-06: 40 mg via ORAL
  Filled 2017-07-05 (×2): qty 2

## 2017-07-05 MED ORDER — ONDANSETRON HCL 4 MG PO TABS: 4.0000 mg | ORAL_TABLET | Freq: Four times a day (QID) | ORAL | Status: DC | PRN

## 2017-07-05 MED ORDER — FUROSEMIDE 40 MG PO TABS: 40.0000 mg | ORAL_TABLET | Freq: Every day | ORAL | Status: DC

## 2017-07-05 MED ORDER — ONDANSETRON HCL 4 MG/2ML IJ SOLN: 4.0000 mg | Freq: Four times a day (QID) | INTRAMUSCULAR | Status: DC | PRN

## 2017-07-05 MED ORDER — ACETAMINOPHEN 650 MG RE SUPP: 650.0000 mg | Freq: Four times a day (QID) | RECTAL | Status: DC | PRN

## 2017-07-05 MED ORDER — ALBUTEROL (5 MG/ML) CONTINUOUS INHALATION SOLN
10.0000 mg/h | INHALATION_SOLUTION | Freq: Once | RESPIRATORY_TRACT | Status: AC
  Administered 2017-07-05: 10 mg/h via RESPIRATORY_TRACT
  Filled 2017-07-05: qty 20

## 2017-07-05 MED ORDER — IPRATROPIUM-ALBUTEROL 0.5-2.5 (3) MG/3ML IN SOLN
3.0000 mL | Freq: Four times a day (QID) | RESPIRATORY_TRACT | Status: DC
  Administered 2017-07-05 – 2017-07-06 (×2): 3 mL via RESPIRATORY_TRACT
  Filled 2017-07-05 (×2): qty 3

## 2017-07-05 MED ORDER — ASPIRIN EC 81 MG PO TBEC
81.0000 mg | DELAYED_RELEASE_TABLET | ORAL | Status: DC
  Administered 2017-07-06: 81 mg via ORAL
  Filled 2017-07-05: qty 1

## 2017-07-05 MED ORDER — METHYLPREDNISOLONE SODIUM SUCC 125 MG IJ SOLR
125.0000 mg | Freq: Once | INTRAMUSCULAR | Status: AC
  Administered 2017-07-05: 125 mg via INTRAVENOUS
  Filled 2017-07-05: qty 2

## 2017-07-05 MED ORDER — HYDROCODONE-ACETAMINOPHEN 5-325 MG PO TABS: 1.0000 | ORAL_TABLET | ORAL | Status: DC | PRN

## 2017-07-05 NOTE — ED Notes (Signed)
Bed: RESA Expected date:  Expected time:  Means of arrival:  Comments: EMS/CHF with wt. gain

## 2017-07-05 NOTE — H&P (Signed)
History and Physical    Roberto Reilly:[id number] DOB: 1934/03/17 DOA: 07/05/2017  PCP: Pearson Grippe, MD   Patient coming from: Home    Chief Complaint: SOB  HPI: Roberto Reilly is a 84 y.o. male with medical history significant of CAD s/p 4v CABG complicated by recurrent coronary artery disease with refusal to treat, AAA, COPD chronically on 3 L, hyperlipidemia, paroxysmal atrial fibrillation on warfarin, diastolic heart failure by last echo in August 2018 who comes in with cough and dyspnea for 2 weeks.  Patient reports approximately 2 weeks ago he began to have more persistent cough.  Patient also noticed that his sputum had changed in color and consistency as well.  During this time he began to use his albuterol nebulizer more frequently.  Initially this helped somewhat with his dyspnea on exertion and shortness of breath however approximately 2 days ago he acutely started worsening again.  He continues to have persistent dyspnea on exertion and severe shortness of breath that was not responsive to his albuterol inhaler.  He denies any chest pain, paroxysmal nocturnal dyspnea, orthopnea, lower extremity edema, nausea vomiting, diarrhea, fevers, rhinorrhea, coryza, rash, abdominal pain, chest pain.   ED Course: In the ED on transport patient was noted to be in significant respiratory distress and CPAP was attempted however patient did not tolerate due to claustrophobic feeling from the mask.  Patient was given IV steroids, hour-long bronchodilator treatment with significant improvement in symptoms.  Review of Systems: As per HPI otherwise 10 point review of systems negative.  Vitals were otherwise unremarkable with patient saturating 97% on his chronic home 3 L.  Labs were completely unremarkable with the exception of a BNP which was 156.2 which is around his baseline.  A flu swab was negative.  Chest x-ray showed no pneumonia.  Troponin was normal.  Blood cultures were obtained.    Past  Medical History:  Diagnosis Date  . AAA (abdominal aortic aneurysm) (HCC)    4.2 X 4.8 cm 04/2013 CT; declined re-eval 02/25/15  . Anxiety   . CAD in native artery March 2007   Cath for dyspnea on exertion: 75% distal LM, 85% RI, 95% mid-distal Cx, in multiple RCA lesions with 95% distal. --> Referred for CABG; has declined further noninvasive evaluation in the absence of worsening symptoms  . COPD (chronic obstructive pulmonary disease) with emphysema (HCC) 05/23/2007   Hosp 5/29-6/01/12- COPD exacerbation ONOX 12/08/10- desaturated to less than 88% for over an hour, qualifying for home O2 during sleep    . Diverticulosis of colon with hemorrhage 05/24/2013  . Dyslipidemia, goal LDL below 70   . History of non-ST Elevation MI (myocardial infarction) March 2007   Admitted with COPD exacerbation, given by CHF. Had some chest pain with positive troponins. Cath showed multivessel disease, referred for CABG  . History of pneumonia   . Hypertension   . Hypoxia     chronic, on home O2  . Obesity (BMI 30-39.9)    One year ago weighed 265 pounds, (12/2012) -- now 234 pounds  . PAF (paroxysmal atrial fibrillation) (HCC)    Anticoagulated on warfarin. Stable -- post op  . Pneumonia    hx  . 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 Seasonal allergies   . Shortness of breath dyspnea     Past Surgical History:  Procedure Laterality Date  . ACHILLES TENDON LENGTHENING Right 10/08/2015   Procedure: ACHILLES TENDON LENGTHENING;  Surgeon: Toni Arthurs,  MD;  Location: [facility] OR;  Service: Orthopedics;  Laterality: Right;  . AMPUTATION Right 10/08/2015   Procedure: RIGHT FOOT TRANSMET AMPUTATION;  Surgeon: Toni ArthursJohn Hewitt, MD;  Location: [facility] OR;  Service: Orthopedics;  Laterality: Right;  . CARDIAC CATHETERIZATION  03 28 2007   NORMAL LV FUNCTION/ ABDOMINAL AORTA STENOSIS,75%-85%. RIGHT FEMORAL ARTERY :CATHETERS USED A  4-FRENCH WITH A 4-FRENCH SHEATH  . CATARACT EXTRACTION     Laser  .  COLONOSCOPY N/A 05/24/2013   Procedure: COLONOSCOPY;  Surgeon: Iva Booparl E Gessner, MD;  Location: [facility] END[facility];  Service: Endoscopy;  Laterality: N/A;  . CORONARY ARTERY BYPASS GRAFT    . RLL resection for Hamartoma     lungs  . RUL for hamartoma     lung  . TRANSTHORACIC ECHOCARDIOGRAM  12/2016   Normal LV size and function.  EF 60-65% - no RWMA.  Mild RA dilation     reports that he quit smoking about 19 years ago. His smoking use included cigarettes. He has a 110.00 pack-year smoking history. he has never used smokeless tobacco. He reports that he does not drink alcohol or use drugs.  Allergies  Allergen Reactions  . Prednisone Other (See Comments)    Makes pt jittery and nervous    Family History  Problem Relation Age of Onset  . Heart attack Mother   . Heart attack Father    Unacceptable: Noncontributory, unremarkable, or negative. Acceptable: Family history reviewed and not pertinent (If you reviewed it)  Prior to Admission medications   Medication Sig Start Date End Date Taking? Authorizing Provider  amLODipine (NORVASC) 10 MG tablet Take 1 tablet (10 mg total) by mouth daily. 05/01/13  Yes [name], Iskra M, MD  aspirin EC 81 MG tablet Take 81 mg by mouth every morning.    Yes [provider]  atorvastatin (LIPITOR) 40 MG tablet Take 1 tablet (40 mg total) by mouth daily. 12/01/16 06/07/18 Yes Marykay LexHarding, David W, MD  budesonide-formoterol [facility](SYMBICORT) 160-4.5 MCG/ACT inhaler Inhale 2 puffs into the lungs 2 (two) times daily. 11/30/16  Yes Young, Joni Fearslinton D, MD  cholecalciferol (VITAMIN D) 1000 UNITS tablet Take 1,000 Units by mouth every morning.    Yes [provider]  furosemide (LASIX) 40 MG tablet Take 1 tablet (40 mg total) daily by mouth. May take an extra 40 mg tablet daily if weight is above 213 lbs ,if needed 04/03/17  Yes Marykay LexHarding, David W, MD  ipratropium-albuterol (DUONEB) 0.5-2.5 (3) MG/3ML SOLN Take 3 mLs by nebulization every 6 (six) hours as needed  (wheezing and shortness of breath).    Yes [provider]  OXYGEN Inhale 3 L into the lungs as needed (shortness of breath).    Yes [provider]  potassium chloride SA (K-DUR,KLOR-CON) 20 MEQ tablet TAKE 1 TABLET (20 MEQ TOTAL) BY MOUTH DAILY. 11/28/16  Yes Marykay LexHarding, David W, MD  warfarin (COUMADIN) 2.5 MG tablet Take 1 to 2 tablets by mouth daily as directed by coumadin clinic Patient taking differently: Take 2.5-5 mg by mouth as directed. Take 2.5mg  by mouth on Tuesday, Thursday, Saturday, and Sunday. Take 5mg  by mouth on Monday, Wednesday, and Friday 06/01/17  Yes Marykay LexHarding, David W, MD    Physical Exam: Vitals:   07/05/17 0930 07/05/17 1000 07/05/17 1030 07/05/17 1130  BP: 127/68 (!) 145/74 (!) 150/73 139/69  Pulse: 68 76 85 81  Resp: 19 17 (!) 22 19  SpO2: 100% 100% 100% 97%  Weight:      Height:  Constitutional: NAD, calm, comfortable Vitals:   07/05/17 0930 07/05/17 1000 07/05/17 1030 07/05/17 1130  BP: 127/68 (!) 145/74 (!) 150/73 139/69  Pulse: 68 76 85 81  Resp: 19 17 (!) 22 19  SpO2: 100% 100% 100% 97%  Weight:      Height:       Eyes: Anicteric sclera ENMT: Moist mucous membranes, nasal cannula in place.  Neck: No JVD, pickwickian neck Respiratory: Oddly increased work of breathing, scattered rhonchi and wheezes throughout, prolonged expiratory phase, increased AP diameter, no accessory muscle use Cardiovascular: Irregularly irregular, distant heart sounds, not tachycardic Abdomen: no tenderness, no masses palpated. No hepatosplenomegaly. Bowel sounds positive.  Musculoskeletal: Trace lower extremity edema chronic venous stasis changes, prominent tortuous veins noted on right leg Skin: Chronic venous stasis changes, tortuous veins noted on right leg Neurologic: Grossly intact, moving all extremities Psychiatric: Normal judgment and insight. Alert and oriented x 3. Normal mood.   (Anything < 9 systems with 2 bullets each down codes to level  1) (If patient refuses exam can't bill higher level) (Make sure to document decubitus ulcers present on admission -- if possible -- and whether patient has chronic indwelling catheter at time of admission)  Labs on Admission: I have personally reviewed following labs and imaging studies  CBC: Recent Labs  Lab 07/05/17 0847  WBC 8.1  NEUTROABS 5.4  HGB 14.9  HCT 45.4  MCV 90.6  PLT 178   Basic Metabolic Panel: Recent Labs  Lab 07/05/17 0847  NA 144  K 4.5  CL 104  CO2 27  GLUCOSE 105*  BUN 21*  CREATININE 1.23  CALCIUM 9.1   GFR: Estimated Creatinine Clearance: 56.6 mL/min (by C-G formula based on SCr of 1.23 mg/dL). Liver Function Tests: Recent Labs  Lab 07/05/17 0847  AST 23  ALT 15*  ALKPHOS 79  BILITOT 0.8  PROT 7.3  ALBUMIN 4.4   No results for input(s): LIPASE, AMYLASE in the last 168 hours. No results for input(s): AMMONIA in the last 168 hours. Coagulation Profile: Recent Labs  Lab 07/05/17 0855  INR 2.21   Cardiac Enzymes: Recent Labs  Lab 07/05/17 0847  TROPONINI <0.03   BNP (last 3 results) No results for input(s): PROBNP in the last 8760 hours. HbA1C: No results for input(s): HGBA1C in the last 72 hours. CBG: No results for input(s): GLUCAP in the last 168 hours. Lipid Profile: No results for input(s): CHOL, HDL, LDLCALC, TRIG, CHOLHDL, LDLDIRECT in the last 72 hours. Thyroid Function Tests: No results for input(s): TSH, T4TOTAL, FREET4, T3FREE, THYROIDAB in the last 72 hours. Anemia Panel: No results for input(s): VITAMINB12, FOLATE, FERRITIN, TIBC, IRON, RETICCTPCT in the last 72 hours. Urine analysis:    Component Value Date/Time   COLORURINE YELLOW 07/05/2017 1040   APPEARANCEUR CLEAR 07/05/2017 1040   LABSPEC 1.010 07/05/2017 1040   PHURINE 6.0 07/05/2017 1040   GLUCOSEU NEGATIVE 07/05/2017 1040   HGBUR NEGATIVE 07/05/2017 1040   BILIRUBINUR NEGATIVE 07/05/2017 1040   KETONESUR NEGATIVE 07/05/2017 1040   PROTEINUR  NEGATIVE 07/05/2017 1040   UROBILINOGEN 0.2 10/19/2010 1150   NITRITE NEGATIVE 07/05/2017 1040   LEUKOCYTESUR NEGATIVE 07/05/2017 1040    Radiological Exams on Admission: Dg Chest Port 1 View  Result Date: 07/05/2017 CLINICAL DATA:  Shortness of breath, cough, and weakness. EXAM: PORTABLE CHEST 1 VIEW COMPARISON:  02/21/2017 FINDINGS: Sequelae of prior CABG are again identified. The cardiac silhouette remains mildly enlarged. There is chronic volume loss in the right hemithorax in this  patient with a history of lung surgery. Chronic blunting of the right costophrenic angle is unchanged. No large pleural effusion, lobar consolidation, edema, or pneumothorax is identified. Old bilateral rib fractures are noted. IMPRESSION: Chronic lung changes without evidence of acute abnormality. Electronically Signed   By: Sebastian Ache M.D.   On: 07/05/2017 09:26    EKG: Independently reviewed.  Atrial fibrillation with no acute ST segment changes  Assessment/Plan Active Problems:   Chronic anticoagulation   CAD in native artery - LM, RCA, RI & Cx disease --> CABG x 4 (LIMA-LAD, SVG-OM/RI, SVG-rPDA-rPL)   S/P CABG x 4   Paroxysmal atrial fibrillation (HCC); CHA2DS2-VASc Score = 4. On Warfarin   Dyslipidemia, goal LDL below 70   Essential hypertension   Generalized anxiety disorder   AAA (abdominal aortic aneurysm) without rupture seen on CT scan   COPD exacerbation (HCC)   Acute on chronic respiratory failure (HCC)   Chronic diastolic CHF (congestive heart failure), NYHA class 2 (HCC)    ##) COPD with acute exacerbation: No PFTs in system to stage COPD however suspect patient has likely Gold stage III or IV. -Continue long-acting bronchodilator and inhaled corticosteroid -4 times daily and as needed DuoNeb's -IV steroids, transition to oral aggressively excellent - technically meets criteria for antibiotics, will treat with IV ceftriaxone and azithromycin with anticipation of discharge on orals  tomorrow -Anticipate discharge tomorrow as patient is improved dramatically  ##) Coronary disease status post CABG: Patient has residual coronary disease however is not interested in any further surgical treatment at this time.   -continue aspirin 81 mg -Not on beta-blocker due to severe COPD per cardiologist -Continue atorvastatin 40 mg Exline-continue aspirin 81 mg  ##) Paroxysmal atrial fibrillation: - Continue warfarin, currently therapeutic on INR  ##) Diastolic heart failure: -Continue furosemide 40 mg daily  ##) Hypertension: -Continue amlodipine 10 mg  Fluids: Tolerating p.o. Elect lites: Monitor and supplement Nutrition: Heart healthy 2 g sodium diet  Prophylaxis: On warfarin  Disposition: Pending stabilization of respiratory status  DO NOT RESUSCITATE    Delaine Lame MD [facility]   If 7PM-7AM, please contact night-coverage [email or web address] Password [facility]  07/05/2017, 12:17 PM

## 2017-07-05 NOTE — ED Provider Notes (Signed)
[facility]-EMERGENCY DEPT Provider Note   CSN: [id number] Arrival date & time: 07/05/17  3664     History   Chief Complaint No chief complaint on file.   HPI Roberto Reilly is a 84 y.o. male.  Pt presents to the ED today with sob.  Pt said sx have been ongoing for about 2 weeks, but worse in the last 2 days.  Pt said he was unable to sleep last night due to sob.  The pt said he's had a cough with yellow sputum.  Pt denies fever.  He is on home O2 (3L [place]) at all times.  EMS tried to put pt on cpap, but he can't tolerate things over his face, so he declined.  Pt does have a hx of a.fib and is on coumadin.  CHA2DS2/VAS Stroke Risk Points      5 >= 2 Points: High Risk  1 - 1.99 Points: Medium Risk  0 Points: Low Risk    The patient's score has not changed in the past year.:  No Change     Details    This score determines the patient's risk of having a stroke if the  patient has atrial fibrillation.       Points Metrics  1 Has Congestive Heart Failure:  Yes   1 Has Vascular Disease:  Yes   1 Has Hypertension:  Yes   2 Age:  35   0 Has Diabetes:  No   0 Had Stroke:  No  Had TIA:  No  Had thromboembolism:  No   0 Male:  No                Past Medical History:  Diagnosis Date  . AAA (abdominal aortic aneurysm) (HCC)    4.2 X 4.8 cm 04/2013 CT; declined re-eval 02/25/15  . Anxiety   . CAD in native artery March 2007   Cath for dyspnea on exertion: 75% distal LM, 85% RI, 95% mid-distal Cx, in multiple RCA lesions with 95% distal. --> Referred for CABG; has declined further noninvasive evaluation in the absence of worsening symptoms  . COPD (chronic obstructive pulmonary disease) with emphysema (HCC) 05/23/2007   Hosp 5/29-6/01/12- COPD exacerbation ONOX 12/08/10- desaturated to less than 88% for over an hour, qualifying for home O2 during sleep    . Diverticulosis of colon with hemorrhage 05/24/2013  . Dyslipidemia, goal LDL below 70   . History of  non-ST Elevation MI (myocardial infarction) March 2007   Admitted with COPD exacerbation, given by CHF. Had some chest pain with positive troponins. Cath showed multivessel disease, referred for CABG  . History of pneumonia   . Hypertension   . Hypoxia     chronic, on home O2  . Obesity (BMI 30-39.9)    One year ago weighed 265 pounds, (12/2012) -- now 234 pounds  . PAF (paroxysmal atrial fibrillation) (HCC)    Anticoagulated on warfarin. Stable -- post op  . Pneumonia    hx  . 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 Seasonal allergies   . Shortness of breath dyspnea     Patient Active Problem List   Diagnosis Date Noted  . Acute respiratory failure with hypoxia (HCC) 02/21/2017  . Acute on chronic congestive heart failure (HCC) 02/21/2017  . Acute respiratory failure (HCC) 02/21/2017  . Chronic diastolic CHF (congestive heart failure), NYHA class 2 (HCC) 01/17/2017  . Acute bronchitis due to respiratory  syncytial virus (RSV) 05/20/2016  . Acute on chronic respiratory failure with hypoxia and hypercapnia (HCC) 05/19/2016  . Shortness of breath 05/18/2016  . Chronic renal failure syndrome, stage 3 (moderate) (HCC) 05/18/2016  . Hypercapnia 05/18/2016  . Hyperglycemia   . Chronic osteomyelitis of right foot (HCC) 02/05/2016  . Acute and chronic respiratory failure with hypoxia (HCC)   . Acute on chronic respiratory failure (HCC) 11/03/2015  . Abnormal TSH 11/03/2015  . Pleural effusion 11/01/2015  . Acute renal failure (ARF) (HCC) 11/01/2015  . Sepsis (HCC) 11/01/2015  . Pneumonia 11/01/2015  . COPD exacerbation (HCC) 11/01/2015  . Encounter for long-term (current) use of other medications 01/07/2014  . Nasal sinus congestion 01/07/2014  . Diverticulosis of colon with hemorrhage 05/24/2013  . Internal hemorrhoids 05/24/2013  . AAA (abdominal aortic aneurysm) without rupture seen on CT scan 05/22/2013  . Rectal bleeding 05/21/2013  . Anemia 05/20/2013    . Generalized anxiety disorder 05/09/2013  . Auditory hallucination 05/09/2013  . COPD mixed type (HCC) 05/07/2013  . COPD with acute exacerbation (HCC) 05/07/2013  . Chest discomfort 05/07/2013  . Hx of scabies 04/29/2013  . Essential hypertension 04/29/2013  . Obesity (BMI 30-39.9) 01/18/2013  . Paroxysmal atrial fibrillation (HCC); CHA2DS2-VASc Score = 4. On Warfarin   . Dyslipidemia, goal LDL below 70   . Chronic anticoagulation 08/09/2012  . Reactive depression (situational) 09/06/2011  . CAD 05/23/2007  . CAD in native artery - LM, RCA, RI & Cx disease --> CABG x 4 (LIMA-LAD, SVG-OM/RI, SVG-rPDA-rPL) 07/21/2005    Class: History of  . S/P CABG x 4 07/21/2005    Past Surgical History:  Procedure Laterality Date  . ACHILLES TENDON LENGTHENING Right 10/08/2015   Procedure: ACHILLES TENDON LENGTHENING;  Surgeon: Toni Arthurs, MD;  Location: [facility] OR;  Service: Orthopedics;  Laterality: Right;  . AMPUTATION Right 10/08/2015   Procedure: RIGHT FOOT TRANSMET AMPUTATION;  Surgeon: Toni Arthurs, MD;  Location: [facility] OR;  Service: Orthopedics;  Laterality: Right;  . CARDIAC CATHETERIZATION  03 28 2007   NORMAL LV FUNCTION/ ABDOMINAL AORTA STENOSIS,75%-85%. RIGHT FEMORAL ARTERY :CATHETERS USED A  4-FRENCH WITH A 4-FRENCH SHEATH  . CATARACT EXTRACTION     Laser  . COLONOSCOPY N/A 05/24/2013   Procedure: COLONOSCOPY;  Surgeon: Iva Boop, MD;  Location: [facility] ENDOSCOPY;  Service: Endoscopy;  Laterality: N/A;  . CORONARY ARTERY BYPASS GRAFT    . RLL resection for Hamartoma     lungs  . RUL for hamartoma     lung  . TRANSTHORACIC ECHOCARDIOGRAM  12/2016   Normal LV size and function.  EF 60-65% - no RWMA.  Mild RA dilation       Home Medications    Prior to Admission medications   Medication Sig Start Date End Date Taking? Authorizing Provider  amLODipine (NORVASC) 10 MG tablet Take 1 tablet (10 mg total) by mouth daily. 05/01/13  Yes Dorothea Ogle, MD  aspirin EC 81 MG tablet Take 81  mg by mouth every morning.    Yes [provider]  atorvastatin (LIPITOR) 40 MG tablet Take 1 tablet (40 mg total) by mouth daily. 12/01/16 06/07/18 Yes Marykay Lex, MD  budesonide-formoterol [facility]) 160-4.5 MCG/ACT inhaler Inhale 2 puffs into the lungs 2 (two) times daily. 11/30/16  Yes Young, Joni Fears D, MD  cholecalciferol (VITAMIN D) 1000 UNITS tablet Take 1,000 Units by mouth every morning.    Yes [provider]  furosemide (LASIX) 40 MG tablet Take 1 tablet (40  mg total) daily by mouth. May take an extra 40 mg tablet daily if weight is above 213 lbs ,if needed 04/03/17  Yes Marykay LexHarding, David W, MD  ipratropium-albuterol (DUONEB) 0.5-2.5 (3) MG/3ML SOLN Take 3 mLs by nebulization every 6 (six) hours as needed (wheezing and shortness of breath).    Yes [provider]  OXYGEN Inhale 3 L into the lungs as needed (shortness of breath).    Yes [provider]  potassium chloride SA (K-DUR,KLOR-CON) 20 MEQ tablet TAKE 1 TABLET (20 MEQ TOTAL) BY MOUTH DAILY. 11/28/16  Yes Marykay LexHarding, David W, MD  warfarin (COUMADIN) 2.5 MG tablet Take 1 to 2 tablets by mouth daily as directed by coumadin clinic Patient taking differently: Take 2.5-5 mg by mouth as directed. Take 2.5mg  by mouth on Tuesday, Thursday, Saturday, and Sunday. Take 5mg  by mouth on Monday, Wednesday, and Friday 06/01/17  Yes Marykay LexHarding, David W, MD    Family History Family History  Problem Relation Age of Onset  . Heart attack Mother   . Heart attack Father     Social History Social History   Tobacco Use  . Smoking status: Former Smoker    Packs/day: 2.00    Years: 55.00    Pack years: 110.00    Types: Cigarettes    Last attempt to quit: 05/23/1998    Years since quitting: 19.1  . Smokeless tobacco: Never Used  Substance Use Topics  . Alcohol use: No  . Drug use: No     Allergies   Prednisone   Review of Systems Review of Systems  Respiratory: Positive for cough, shortness of breath and  wheezing.   All other systems reviewed and are negative.    Physical Exam Updated Vital Signs BP 139/69   Pulse 81   Resp 19   Ht 6' (1.829 m)   Wt 96.2 kg (212 lb)   SpO2 97%   BMI 28.75 kg/m   Physical Exam  Constitutional: He is oriented to person, place, and time. He appears well-developed. He appears distressed.  HENT:  Head: Normocephalic and atraumatic.  Right Ear: External ear normal.  Left Ear: External ear normal.  Nose: Nose normal.  Mouth/Throat: Oropharynx is clear and moist.  Eyes: Conjunctivae and EOM are normal. Pupils are equal, round, and reactive to light.  Neck: Normal range of motion. Neck supple.  Cardiovascular: Normal rate, normal heart sounds and intact distal pulses. An irregularly irregular rhythm present.  Pulmonary/Chest: Accessory muscle usage present. Tachypnea noted. He is in respiratory distress.  Abdominal: Soft. Bowel sounds are normal.  Musculoskeletal: Normal range of motion.  Neurological: He is alert and oriented to person, place, and time.  Skin: Skin is warm. Capillary refill takes less than 2 seconds.  Psychiatric: He has a normal mood and affect. His behavior is normal. Judgment and thought content normal.  Nursing note and vitals reviewed.    ED Treatments / Results  Labs (all labs ordered are listed, but only abnormal results are displayed) Labs Reviewed  COMPREHENSIVE METABOLIC PANEL - Abnormal; Notable for the following components:      Result Value   Glucose, Bld 105 (*)    BUN 21 (*)    ALT 15 (*)    GFR calc non Af Amer 53 (*)    All other components within normal limits  BRAIN NATRIURETIC PEPTIDE - Abnormal; Notable for the following components:   B Natriuretic Peptide 156.2 (*)    All other components within normal limits  PROTIME-INR -  Abnormal; Notable for the following components:   Prothrombin Time 24.3 (*)    All other components within normal limits  CULTURE, BLOOD (ROUTINE X 2)  CULTURE, BLOOD (ROUTINE X  2)  CBC WITH DIFFERENTIAL/PLATELET  TROPONIN I  URINALYSIS, ROUTINE W REFLEX MICROSCOPIC  INFLUENZA PANEL BY PCR (TYPE A & B)    EKG  EKG Interpretation  Date/Time:  Wednesday July 05 2017 08:56:10 EST Ventricular Rate:  78 PR Interval:    QRS Duration: 141 QT Interval:  408 QTC Calculation: 465 R Axis:   84 Text Interpretation:  Atrial fibrillation Right bundle branch block No significant change since last tracing Confirmed by Jacalyn Lefevre [phone]) on 07/05/2017 9:07:21 AM Also confirmed by Jacalyn Lefevre [phone]), editor Barbette Hair [phone])  on 07/05/2017 9:31:12 AM       Radiology Dg Chest Port 1 View  Result Date: 07/05/2017 CLINICAL DATA:  Shortness of breath, cough, and weakness. EXAM: PORTABLE CHEST 1 VIEW COMPARISON:  02/21/2017 FINDINGS: Sequelae of prior CABG are again identified. The cardiac silhouette remains mildly enlarged. There is chronic volume loss in the right hemithorax in this patient with a history of lung surgery. Chronic blunting of the right costophrenic angle is unchanged. No large pleural effusion, lobar consolidation, edema, or pneumothorax is identified. Old bilateral rib fractures are noted. IMPRESSION: Chronic lung changes without evidence of acute abnormality. Electronically Signed   By: Sebastian Ache M.D.   On: 07/05/2017 09:26    Procedures Procedures (including critical care time)  Medications Ordered in ED Medications  albuterol (PROVENTIL,VENTOLIN) solution continuous neb (10 mg/hr Nebulization Given 07/05/17 0918)  methylPREDNISolone sodium succinate (SOLU-MEDROL) 125 mg/2 mL injection 125 mg (125 mg Intravenous Given 07/05/17 1012)     Initial Impression / Assessment and Plan / ED Course  I have reviewed the triage vital signs and the nursing notes.  Pertinent labs & imaging results that were available during my care of the patient were reviewed by me and considered in my medical decision making (see chart for details).    Pt has  improved with nebs and steroids, but he is still very wheezy and gets tachypneic with movement.  Pt d/w Dr. Clearnce Sorrel ([facility]) for admission.  Final Clinical Impressions(s) / ED Diagnoses   Final diagnoses:  COPD exacerbation (HCC)  Chronic atrial fibrillation [facility])    ED Discharge Orders    None       Jacalyn Lefevre, MD 07/05/17 1212

## 2017-07-05 NOTE — ED Triage Notes (Signed)
Per EMS - SOB X 2 wks, RLL removal, CHF gained 5lbs in 2 wks, labored breathing, audible rales. Pt refused IV and CPAP.

## 2017-07-06 DIAGNOSIS — I5032 Chronic diastolic (congestive) heart failure: Secondary | ICD-10-CM | POA: Diagnosis not present

## 2017-07-06 DIAGNOSIS — J441 Chronic obstructive pulmonary disease with (acute) exacerbation: Secondary | ICD-10-CM | POA: Diagnosis not present

## 2017-07-06 DIAGNOSIS — J9621 Acute and chronic respiratory failure with hypoxia: Secondary | ICD-10-CM | POA: Diagnosis not present

## 2017-07-06 DIAGNOSIS — I482 Chronic atrial fibrillation: Secondary | ICD-10-CM | POA: Diagnosis not present

## 2017-07-06 LAB — CBC
HCT: 44.2 % (ref 39.0–52.0)
Hemoglobin: 14.2 g/dL (ref 13.0–17.0)
MCH: 28.9 pg (ref 26.0–34.0)
MCHC: 32.1 g/dL (ref 30.0–36.0)
MCV: 89.8 fL (ref 78.0–100.0)
Platelets: 164 K/uL (ref 150–400)
RBC: 4.92 MIL/uL (ref 4.22–5.81)
RDW: 13.7 % (ref 11.5–15.5)
WBC: 8.5 10*3/uL (ref 4.0–10.5)

## 2017-07-06 LAB — BASIC METABOLIC PANEL
BUN: 34 mg/dL — ABNORMAL HIGH (ref 6–20)
CO2: 22 mmol/L (ref 22–32)
Chloride: 102 mmol/L (ref 101–111)
Glucose, Bld: 146 mg/dL — ABNORMAL HIGH (ref 65–99)
Potassium: 4.5 mmol/L (ref 3.5–5.1)
Sodium: 137 mmol/L (ref 135–145)

## 2017-07-06 LAB — BASIC METABOLIC PANEL WITH GFR
Anion gap: 13 (ref 5–15)
Calcium: 8.8 mg/dL — ABNORMAL LOW (ref 8.9–10.3)
Creatinine, Ser: 1.34 mg/dL — ABNORMAL HIGH (ref 0.61–1.24)
GFR calc Af Amer: 56 mL/min — ABNORMAL LOW (ref >60)
GFR calc non Af Amer: 48 mL/min — ABNORMAL LOW (ref >60)

## 2017-07-06 LAB — PROTIME-INR
INR: 2.18
Prothrombin Time: 24 s — ABNORMAL HIGH (ref 11.4–15.2)

## 2017-07-06 MED ORDER — IPRATROPIUM-ALBUTEROL 0.5-2.5 (3) MG/3ML IN SOLN
3.0000 mL | Freq: Three times a day (TID) | RESPIRATORY_TRACT | Status: DC
  Administered 2017-07-06: 3 mL via RESPIRATORY_TRACT
  Filled 2017-07-06: qty 3

## 2017-07-06 MED ORDER — GUAIFENESIN ER 600 MG PO TB12
600.0000 mg | ORAL_TABLET | Freq: Two times a day (BID) | ORAL | Status: DC
  Administered 2017-07-06: 600 mg via ORAL
  Filled 2017-07-06: qty 1

## 2017-07-06 MED ORDER — PREDNISONE 20 MG PO TABS: 40.0000 mg | ORAL_TABLET | Freq: Every day | ORAL | 0 refills | Status: DC

## 2017-07-06 MED ORDER — LEVOFLOXACIN 500 MG PO TABS: 500.0000 mg | ORAL_TABLET | Freq: Every day | ORAL | 0 refills | Status: AC

## 2017-07-06 NOTE — Progress Notes (Signed)
PHARMACIST - PHYSICIAN COMMUNICATION CONCERNING: Pharmacy Care Issues Regarding Warfarin Labs  RECOMMENDATION (Action Taken): A baseline and daily protime for three days has been ordered to meet the [facility] National Patient safety goal and comply with the current [facility] policy.   The Pharmacy will defer all warfarin dose order changes and follow up of lab results to the prescriber unless an additional order to initiate a "pharmacy Coumadin consult" is placed.  DESCRIPTION:  While hospitalized, to be in compliance with [employer] National Patient Safety Goals, all patients on warfarin must have a baseline and/or current protime prior to the administration of warfarin. Pharmacy has received your order for warfarin without these required laboratory assessments.  Clance BollAmanda Amir Fick, PharmD, BCPS Pager: [phone] 07/06/2017 8:42 AM

## 2017-07-06 NOTE — Discharge Summary (Signed)
Physician Discharge Summary  Roberto Reilly ZOX:[id number] DOB: Sep 25, 1933 DOA: 07/05/2017  PCP: Roberto Grippe, MD  Admit date: 07/05/2017 Discharge date: 07/06/2017  Admitted From: Home Disposition:  Home   Discharge Condition:Stable CODE STATUS:DNR Diet recommendation: Heart Healthy   Brief/Interim Summary: Admission H and P: Roberto Reilly is a 84 y.o. male with medical history significant of CAD s/p 4v CABG complicated by recurrent coronary artery disease with refusal to treat, AAA, COPD chronically on 3 L, hyperlipidemia, paroxysmal atrial fibrillation on warfarin, diastolic heart failure by last echo in August 2018 who comes in with cough and dyspnea for 2 weeks.  Patient reports approximately 2 weeks ago he began to have more persistent cough.  Patient also noticed that his sputum had changed in color and consistency as well.  During this time he began to use his albuterol nebulizer more frequently.  Initially this helped somewhat with his dyspnea on exertion and shortness of breath however approximately 2 days ago he acutely started worsening again.  He continues to have persistent dyspnea on exertion and severe shortness of breath that was not responsive to his albuterol inhaler.  He denies any chest pain, paroxysmal nocturnal dyspnea, orthopnea, lower extremity edema, nausea vomiting, diarrhea, fevers, rhinorrhea, coryza, rash, abdominal pain, chest pain.  Hospital Course: Patient was found to be in COPD exacerbation and was admitted for further management.  He was started on IV steroids and antibiotics. His respiratory status significantly improved  this morning. Patient does not have any significant wheezes this morning.  He is not short of breath.  He is on oxygen at home.  He is stable to be discharged home today. He will be discharged on oral prednisone and antibiotic.   Following problems were addressed during his hospitalization:   COPD with acute exacerbation:  -Continue his home  inhalers. -Discharged with a short course of prednisone .  He is on oxygen at home. We recommend to follow-up with pulmonology as an outpatient.  Coronary disease status post CABG: Patient has residual coronary disease however is not interested in any further surgical treatment at this time.   -continue aspirin 81 mg -Not on beta-blocker due to severe COPD per cardiologist -Continue atorvastatin 40 mg Exline-continue aspirin 81 mg  Paroxysmal atrial fibrillation: - Continue warfarin, currently therapeutic on INR  Diastolic heart failure: -Continue furosemide 40 mg daily  Hypertension: -Continue amlodipine 10 mg  AKI vs CKD:  -Mild.  Will recommend to check kidney function in a week by doing a BMP test during his visit to his PCP.      Discharge Diagnoses:  Active Problems:   Chronic anticoagulation   CAD in native artery - LM, RCA, RI & Cx disease --> CABG x 4 (LIMA-LAD, SVG-OM/RI, SVG-rPDA-rPL)   S/P CABG x 4   Paroxysmal atrial fibrillation (HCC); CHA2DS2-VASc Score = 4. On Warfarin   Dyslipidemia, goal LDL below 70   Essential hypertension   COPD with acute exacerbation (HCC)   Generalized anxiety disorder   AAA (abdominal aortic aneurysm) without rupture seen on CT scan   COPD exacerbation (HCC)   Acute on chronic respiratory failure (HCC)   Chronic diastolic CHF (congestive heart failure), NYHA class 2 (HCC)    Discharge Instructions  Discharge Instructions    Diet - low sodium heart healthy   Complete by:  As directed    Discharge instructions   Complete by:  As directed    1) Please follow up with your PCP in a week. 2) Take prescribed  medications as instructed. 3) Do a CBC and BMP test in a week when you follow up with your PCP.   Increase activity slowly   Complete by:  As directed      Allergies as of 07/06/2017   No Active Allergies     Medication List    TAKE these medications   amLODipine 10 MG tablet Commonly known as:  NORVASC Take 1  tablet (10 mg total) by mouth daily.   aspirin EC 81 MG tablet Take 81 mg by mouth every morning.   atorvastatin 40 MG tablet Commonly known as:  LIPITOR Take 1 tablet (40 mg total) by mouth daily.   budesonide-formoterol 160-4.5 MCG/ACT inhaler Commonly known as:  SYMBICORT Inhale 2 puffs into the lungs 2 (two) times daily.   cholecalciferol 1000 units tablet Commonly known as:  VITAMIN D Take 1,000 Units by mouth every morning.   DUONEB 0.5-2.5 (3) MG/3ML Soln Generic drug:  ipratropium-albuterol Take 3 mLs by nebulization every 6 (six) hours as needed (wheezing and shortness of breath).   furosemide 40 MG tablet Commonly known as:  LASIX Take 1 tablet (40 mg total) daily by mouth. May take an extra 40 mg tablet daily if weight is above 213 lbs ,if needed   levofloxacin 500 MG tablet Commonly known as:  LEVAQUIN Take 1 tablet (500 mg total) by mouth daily for 5 days. Start taking on:  07/07/2017   OXYGEN Inhale 3 L into the lungs as needed (shortness of breath).   potassium chloride SA 20 MEQ tablet Commonly known as:  K-DUR,KLOR-CON TAKE 1 TABLET (20 MEQ TOTAL) BY MOUTH DAILY.   predniSONE 20 MG tablet Commonly known as:  DELTASONE Take 2 tablets (40 mg total) by mouth daily with supper.   warfarin 2.5 MG tablet Commonly known as:  COUMADIN Take as directed. If you are unsure how to take this medication, talk to your nurse or doctor. Original instructions:  Take 1 to 2 tablets by mouth daily as directed by coumadin clinic What changed:    how much to take  how to take this  when to take this  additional instructions      Follow-up Information    Roberto GrippeKim, Tyller, MD. Schedule an appointment as soon as possible for a visit in 1 week(s).   Specialty:  Internal Medicine Contact information: [address] [place] [address] [phone]          No Active Allergies  Consultations: None  Procedures/Studies: Dg Chest Port 1  View  Result Date: 07/05/2017 CLINICAL DATA:  Shortness of breath, cough, and weakness. EXAM: PORTABLE CHEST 1 VIEW COMPARISON:  02/21/2017 FINDINGS: Sequelae of prior CABG are again identified. The cardiac silhouette remains mildly enlarged. There is chronic volume loss in the right hemithorax in this patient with a history of lung surgery. Chronic blunting of the right costophrenic angle is unchanged. No large pleural effusion, lobar consolidation, edema, or pneumothorax is identified. Old bilateral rib fractures are noted. IMPRESSION: Chronic lung changes without evidence of acute abnormality. Electronically Signed   By: Sebastian AcheAllen  Grady M.D.   On: 07/05/2017 09:26    None   Subjective: Patient seen and examined the bedside this morning .Remains comfortable.  Not short of breath.  Discussed about the discharge plan. Gave a call to his daughter but she did not receive the phone.  Discharge Exam: Vitals:   07/06/17 0438 07/06/17 0803  BP: 130/79   Pulse: 77   Resp: 14  Temp: (!) 97.5 F (36.4 C)   SpO2: 97% 97%   Vitals:   07/05/17 2035 07/05/17 2122 07/06/17 0438 07/06/17 0803  BP: (!) 146/98  130/79   Pulse: 96  77   Resp: 16  14   Temp: 98 F (36.7 C)  (!) 97.5 F (36.4 C)   TempSrc: Oral  Oral   SpO2: 98% 97% 97% 97%  Weight:      Height:        General: Pt is alert, awake, not in acute distress Cardiovascular: RRR, S1/S2 +, no rubs, no gallops Respiratory: Bilateral decreased air entry Abdominal: Soft, NT, ND, bowel sounds + Extremities: no edema, no cyanosis    The results of significant diagnostics from this hospitalization (including imaging, microbiology, ancillary and laboratory) are listed below for reference.     Microbiology: No results found for this or any previous visit (from the past 240 hour(s)).   Labs: BNP (last 3 results) Recent Labs    01/17/17 1543 02/21/17 2014 07/05/17 0847  BNP 194.9* 163.1* 156.2*   Basic Metabolic Panel: Recent  Labs  Lab 07/05/17 0847 07/06/17 0402  NA 144 137  K 4.5 4.5  CL 104 102  CO2 27 22  GLUCOSE 105* 146*  BUN 21* 34*  CREATININE 1.23 1.34*  CALCIUM 9.1 8.8*   Liver Function Tests: Recent Labs  Lab 07/05/17 0847  AST 23  ALT 15*  ALKPHOS 79  BILITOT 0.8  PROT 7.3  ALBUMIN 4.4   No results for input(s): LIPASE, AMYLASE in the last 168 hours. No results for input(s): AMMONIA in the last 168 hours. CBC: Recent Labs  Lab 07/05/17 0847 07/06/17 0402  WBC 8.1 8.5  NEUTROABS 5.4  --   HGB 14.9 14.2  HCT 45.4 44.2  MCV 90.6 89.8  PLT 178 164   Cardiac Enzymes: Recent Labs  Lab 07/05/17 0847  TROPONINI <0.03   BNP: Invalid input(s): POCBNP CBG: No results for input(s): GLUCAP in the last 168 hours. D-Dimer No results for input(s): DDIMER in the last 72 hours. Hgb A1c No results for input(s): HGBA1C in the last 72 hours. Lipid Profile No results for input(s): CHOL, HDL, LDLCALC, TRIG, CHOLHDL, LDLDIRECT in the last 72 hours. Thyroid function studies No results for input(s): TSH, T4TOTAL, T3FREE, THYROIDAB in the last 72 hours.  Invalid input(s): FREET3 Anemia work up No results for input(s): VITAMINB12, FOLATE, FERRITIN, TIBC, IRON, RETICCTPCT in the last 72 hours. Urinalysis    Component Value Date/Time   COLORURINE YELLOW 07/05/2017 1040   APPEARANCEUR CLEAR 07/05/2017 1040   LABSPEC 1.010 07/05/2017 1040   PHURINE 6.0 07/05/2017 1040   GLUCOSEU NEGATIVE 07/05/2017 1040   HGBUR NEGATIVE 07/05/2017 1040   BILIRUBINUR NEGATIVE 07/05/2017 1040   KETONESUR NEGATIVE 07/05/2017 1040   PROTEINUR NEGATIVE 07/05/2017 1040   UROBILINOGEN 0.2 10/19/2010 1150   NITRITE NEGATIVE 07/05/2017 1040   LEUKOCYTESUR NEGATIVE 07/05/2017 1040   Sepsis Labs Invalid input(s): PROCALCITONIN,  WBC,  LACTICIDVEN Microbiology No results found for this or any previous visit (from the past 240 hour(s)).   Time coordinating discharge: Over 30 minutes  SIGNED:   Meredith Leeds, MD  [facility] 07/06/2017, 10:12 AM Pager [phone]  If 7PM-7AM, please contact night-coverage [email or web address] Password [email or web address]

## 2017-07-10 LAB — CULTURE, BLOOD (ROUTINE X 2)
Culture: NO GROWTH
Culture: NO GROWTH
SPECIAL REQUESTS: ADEQUATE
SPECIAL REQUESTS: ADEQUATE

## 2017-08-09 NOTE — Assessment & Plan Note (Signed)
Frustrating difficulty getting financial support for his medications.  We discussed patient assistance and product availability. Plan-sample x3 Symbicort

## 2017-08-10 ENCOUNTER — Ambulatory Visit (INDEPENDENT_AMBULATORY_CARE_PROVIDER_SITE_OTHER): Payer: Medicare Other | Admitting: Pharmacist

## 2017-08-10 DIAGNOSIS — I4891 Unspecified atrial fibrillation: Secondary | ICD-10-CM

## 2017-08-10 DIAGNOSIS — Z7901 Long term (current) use of anticoagulants: Secondary | ICD-10-CM

## 2017-08-10 LAB — POCT INR: INR: 1.9

## 2017-08-24 ENCOUNTER — Encounter (INDEPENDENT_AMBULATORY_CARE_PROVIDER_SITE_OTHER): Payer: Medicare Other | Admitting: Ophthalmology

## 2017-09-04 ENCOUNTER — Other Ambulatory Visit: Payer: Self-pay | Admitting: Cardiology

## 2017-09-04 NOTE — Telephone Encounter (Signed)
Rx has been sent to the pharmacy electronically. ° °

## 2017-09-05 ENCOUNTER — Telehealth: Payer: Self-pay | Admitting: Internal Medicine

## 2017-09-05 NOTE — Telephone Encounter (Signed)
Called and spoke with patient, advised patient due to running low on samples I could give him one sample of symbicort. Patient will pick this up on Thursday. Nothing further needed.

## 2017-09-20 ENCOUNTER — Ambulatory Visit (INDEPENDENT_AMBULATORY_CARE_PROVIDER_SITE_OTHER): Payer: Medicare Other | Admitting: Pharmacist

## 2017-09-20 ENCOUNTER — Encounter: Payer: Self-pay | Admitting: Cardiology

## 2017-09-20 ENCOUNTER — Ambulatory Visit (INDEPENDENT_AMBULATORY_CARE_PROVIDER_SITE_OTHER): Payer: Medicare Other | Admitting: Cardiology

## 2017-09-20 VITALS — BP 140/72 | HR 90 | Ht 72.0 in | Wt 213.6 lb

## 2017-09-20 DIAGNOSIS — Z7189 Other specified counseling: Secondary | ICD-10-CM

## 2017-09-20 DIAGNOSIS — I480 Paroxysmal atrial fibrillation: Secondary | ICD-10-CM

## 2017-09-20 DIAGNOSIS — Z7901 Long term (current) use of anticoagulants: Secondary | ICD-10-CM

## 2017-09-20 DIAGNOSIS — I4891 Unspecified atrial fibrillation: Secondary | ICD-10-CM

## 2017-09-20 DIAGNOSIS — I5032 Chronic diastolic (congestive) heart failure: Secondary | ICD-10-CM | POA: Diagnosis not present

## 2017-09-20 DIAGNOSIS — I2510 Atherosclerotic heart disease of native coronary artery without angina pectoris: Secondary | ICD-10-CM | POA: Diagnosis not present

## 2017-09-20 DIAGNOSIS — E785 Hyperlipidemia, unspecified: Secondary | ICD-10-CM | POA: Diagnosis not present

## 2017-09-20 LAB — POCT INR: INR: 1.9

## 2017-09-20 NOTE — Progress Notes (Signed)
PCP: Pearson Grippe, MD  Clinic Note: Chief Complaint  Patient presents with  . Follow-up    pt denies chest pains, swelling in hands/feet, does have o2 for SOB in room  . Congestive Heart Failure    Diastolic    HPI: Roberto Reilly is a 84 y.o. male with a PMH below who presents today for six-month follow-up for CAD-CABG with PAD and PAF.Marland Kitchen He is a former patient of Dr. Caprice Kluver. He has a history of CABG in 2007 following catheterization catheterization for dyspnea on exertion. He also has long-standing intermittently oxygen requiring COPD.  He has been reluctant to undergo non-invasive ischemic evaluations in the past unless symptoms warrant  .Roberto Reilly was last seen on April 19, 2017 For hospital follow-up doing relatively well.  He was trying to get his dry weight down to about 210 pounds.  Recent Hospitalizations:   July 05, 2017 admitted for COPD exacerbation  Studies Personally Reviewed - (if available, images/films reviewed: From Epic Chart or Care Everywhere)  No new studies  Interval History: Roberto Reilly returns today pretty much @ baseline -- on 24 hr Home O2 with concentrator.  He is always short of breath, worse with exertion.  Pretty much limited mobility No orthopnea - but does wake up every 2-4 hr having to sit up & cough to clear his chest.  Minimal edema.  He has been maintaining dry weight at home anywhere from 207- 209 pounds.  He takes an additional Lasix when he gets over 210 pounds but has done at 5 times in the month of April.  He has some orthopnea more because of his baseline dyspnea but no real PND.  More waking up with coughing.  No CP with rest or exertion. No feeling whatsoever of abnormal HR.   No syncope/near syncope or TIA/amaurosis fugax. Denies any heartbeats or palpitations.  No claudication  ROS: A comprehensive was performed. Review of Systems  Constitutional: Positive for malaise/fatigue (Still tires out quickly.). Negative for weight  loss (Maintaining wgt 207-211 Lb).  HENT: Negative for congestion and nosebleeds.   Respiratory: Positive for cough, shortness of breath and wheezing.        Chronic COPD -on O2  Cardiovascular: Positive for leg swelling (Stable edema.  Now wearing support stockings intermittently.). Negative for palpitations.  Gastrointestinal: Negative for blood in stool and melena.  Genitourinary: Negative for hematuria.  Musculoskeletal: Positive for joint pain (Hips and knees).  Neurological: Positive for dizziness (Occasionally if he moves quickly.). Negative for headaches.  Endo/Heme/Allergies: Does not bruise/bleed easily.  Psychiatric/Behavioral: The patient has insomnia (Frequent urination).   All other systems reviewed and are negative.   I have reviewed and (if needed) personally updated the patient's problem list, medications, allergies, past medical and surgical history, social and family history.   Past Medical History:  Diagnosis Date  . AAA (abdominal aortic aneurysm) (HCC)    4.2 X 4.8 cm 04/2013 CT; declined re-eval 02/25/15  . Anxiety   . CAD in native artery March 2007   Cath for dyspnea on exertion: 75% distal LM, 85% RI, 95% mid-distal Cx, in multiple RCA lesions with 95% distal. --> Referred for CABG; has declined further noninvasive evaluation in the absence of worsening symptoms  . COPD (chronic obstructive pulmonary disease) with emphysema (HCC) 05/23/2007   Hosp 5/29-6/01/12- COPD exacerbation ONOX 12/08/10- desaturated to less than 88% for over an hour, qualifying for home O2 during sleep    . Diverticulosis of colon with hemorrhage 05/24/2013  .  Dyslipidemia, goal LDL below 70   . History of non-ST Elevation MI (myocardial infarction) March 2007   Admitted with COPD exacerbation, given by CHF. Had some chest pain with positive troponins. Cath showed multivessel disease, referred for CABG  . History of pneumonia   . Hypertension   . Hypoxia     chronic, on home O2  . Obesity  (BMI 30-39.9)    One year ago weighed 265 pounds, (12/2012) -- now 234 pounds  . PAF (paroxysmal atrial fibrillation) (HCC)    Anticoagulated on warfarin. Stable -- post op  . Pneumonia    hx  . 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 Seasonal allergies   . Shortness of breath dyspnea     Past Surgical History:  Procedure Laterality Date  . ACHILLES TENDON LENGTHENING Right 10/08/2015   Procedure: ACHILLES TENDON LENGTHENING;  Surgeon: Toni Arthurs, MD;  Location: [facility] OR;  Service: Orthopedics;  Laterality: Right;  . AMPUTATION Right 10/08/2015   Procedure: RIGHT FOOT TRANSMET AMPUTATION;  Surgeon: Toni Arthurs, MD;  Location: [facility] OR;  Service: Orthopedics;  Laterality: Right;  . CARDIAC CATHETERIZATION  03 28 2007   NORMAL LV FUNCTION/ ABDOMINAL AORTA STENOSIS,75%-85%. RIGHT FEMORAL ARTERY :CATHETERS USED A  4-FRENCH WITH A 4-FRENCH SHEATH  . CATARACT EXTRACTION     Laser  . COLONOSCOPY N/A 05/24/2013   Procedure: COLONOSCOPY;  Surgeon: Iva Boop, MD;  Location: [facility];  Service: Endoscopy;  Laterality: N/A;  . CORONARY ARTERY BYPASS GRAFT    . RLL resection for Hamartoma     lungs  . RUL for hamartoma     lung  . TRANSTHORACIC ECHOCARDIOGRAM  12/2016   Normal LV size and function.  EF 60-65% - no RWMA.  Mild RA dilation    Current Meds  Medication Sig  . amLODipine (NORVASC) 10 MG tablet Take 1 tablet (10 mg total) by mouth daily.  Marland Kitchen aspirin EC 81 MG tablet Take 81 mg by mouth every morning.   Marland Kitchen atorvastatin (LIPITOR) 40 MG tablet Take 1 tablet (40 mg total) by mouth daily.  . budesonide-formoterol (SYMBICORT) 160-4.5 MCG/ACT inhaler Inhale 2 puffs into the lungs 2 (two) times daily.  . cholecalciferol (VITAMIN D) 1000 UNITS tablet Take 1,000 Units by mouth every morning.   . furosemide (LASIX) 40 MG tablet Take 1 tablet (40 mg total) daily by mouth. May take an extra 40 mg tablet daily if weight is above 213 lbs ,if needed  . ipratropium-albuterol  (DUONEB) 0.5-2.5 (3) MG/3ML SOLN Take 3 mLs by nebulization every 6 (six) hours as needed (wheezing and shortness of breath).   . OXYGEN Inhale 3 L into the lungs as needed (shortness of breath).   . potassium chloride SA (K-DUR,KLOR-CON) 20 MEQ tablet TAKE 1 TABLET (20 MEQ TOTAL) BY MOUTH DAILY.  Marland Kitchen warfarin (COUMADIN) 2.5 MG tablet Take 1 to 2 tablets by mouth daily as directed by coumadin clinic (Patient taking differently: Take 2.5-5 mg by mouth as directed. Take 2.5mg  by mouth on Tuesday, Thursday, Saturday, and Sunday. Take  by mouth on Monday, Wednesday, and Friday)    No Known Allergies  Social History   Tobacco Use  . Smoking status: Former Smoker    Packs/day: 2.00    Years: 55.00    Pack years: 110.00    Types: Cigarettes    Last attempt to quit: 05/23/1998    Years since quitting: 19.3  . Smokeless tobacco: Never Used  Substance Use Topics  . Alcohol use: No  . Drug use: No   Social History   Social History Narrative   He is a married father of 2, grandfather of 38, does not get routine exercise. He is a former smoker, but does not currently or not drink alcohol.    He is modifying his diet and trying to get activity but is doing better with the diet than the activity.    He is under a lot of stress.    He was the caregiver for his wife, who was in poor health. She died 11/03/15.   Wears DNR bracelet.   -=-DNR  family history includes Heart attack in his father and mother.  Wt Readings from Last 3 Encounters:  09/20/17 213 lb 9.6 oz (96.9 kg)  07/05/17 212 lb (96.2 kg)  06/07/17 217 lb (98.4 kg)    PHYSICAL EXAM BP 140/72 (BP Location: Left Arm)   Pulse 90   Ht 6' (1.829 m)   Wt 213 lb 9.6 oz (96.9 kg)   BMI 28.97 kg/m  Physical Exam  Constitutional: He is oriented to person, place, and time. He appears well-developed and well-nourished. No distress (No immediate distress, just baseline chronically ill-appearing.  He sits in a tripod position with  increased work of breathing and mild expiratory wheezes.  Relatively stable.).  HENT:  Head: Normocephalic and atraumatic.  Neck: Normal range of motion. Neck supple. No hepatojugular reflux and no JVD present. Carotid bruit is not present.  Cardiovascular: Normal rate, regular rhythm and normal pulses.  No extrasystoles are present. Exam reveals gallop, S4 and distant heart sounds.  Pulmonary/Chest: No respiratory distress (No real distress, just baseline accessory muscle use.  Bucket-handle breathing.). He has wheezes (Diffuse with rhonchi). He has no rales.  Barrel chested with increased AP diameter.  Prolonged extremity phase.  Baseline nasal cannula oxygen with concentrator.  Abdominal: Soft. Bowel sounds are normal. He exhibits no distension. There is no tenderness. There is no rebound.  Musculoskeletal: Normal range of motion. He exhibits no edema.  Neurological: He is alert and oriented to person, place, and time.  Skin: Skin is warm and dry.  Psychiatric: He has a normal mood and affect. His behavior is normal. Judgment and thought content normal.    Adult ECG Report Not checked  Other studies Reviewed: Additional studies/ records that were reviewed today include:  Recent Labs: Labs are followed by PCP. Lab Results  Component Value Date   CREATININE 1.34 (H) 07/06/2017   BUN 34 (H) 07/06/2017   NA 137 07/06/2017   K 4.5 07/06/2017   CL 102 07/06/2017   CO2 22 07/06/2017    ASSESSMENT / PLAN: Problem List Items Addressed This Visit    Paroxysmal atrial fibrillation (HCC); CHA2DS2-VASc Score = 4. On Warfarin (Chronic)    A little bit irregular today, but I think he probably is in sinus rhythm.  He has not really noted any rapid beats, and that he does not really know when he is or is not in A. fib. Rate today is 90 bpm.  We talked about possibly converting from Norvasc to diltiazem if rate becomes an issue, however for now I think we will leave it alone with no real  control. Continue warfarin for anticoagulation.      Dyslipidemia, goal LDL below 70 (Chronic)    On atorvastatin.  Labs followed by PCP.      Counseling regarding end of life decision making    Roberto Reilly  wears a bracelet indicating DNR.  He is not sure if he actually has the paperwork signed by his PCP at home.  We talked for a few minutes about his goals of care and he clearly would not be interested in aggressive heroic measures.  I am not sure if he is quite at the state yet, but may be close to considering filling out a MOST form. I have asked that he discuss this with his PCP to ensure that he has appropriate documentation at home, And that his family is aware of his decisions.      Chronic diastolic CHF (congestive heart failure), NYHA class 2 (HCC) - Primary (Chronic)    Diastolic heart failure made worse by A. fib that exacerbates his COPD. Maintain stable dry weight space on his most recent log.  Using standing dose and sliding scale Lasix.  Based on his log, he seems understand quite well.      CAD in native artery - LM, RCA, RI & Cx disease --> CABG x 4 (LIMA-LAD, SVG-OM/RI, SVG-rPDA-rPL) (Chronic)    Multivessel disease status post CABG has not really had any active angina since then.  He has declined any ischemic evaluation in the absence of any notable change.  In fact he probably would not want to have anything done regardless. Not on beta-blocker for COPD reasons.  He is on amlodipine for antianginal effect along with aspirin and statin.         Current medicines are reviewed at length with the patient today. (+/- concerns) not sure how to do sliding scale Lasix The following changes have been made:See below  Patient Instructions  MEDICATION INSTRUCTIONS  NO CHANGES      Your physician wants you to follow-up in 12 MONTH WITH DR HARDING.You will receive a reminder letter in the mail two months in advance. If you don't receive a letter, please call our office to  schedule the follow-up appointment.    If you need a refill on your cardiac medications before your next appointment, please call your pharmacy.    Studies Ordered:   No orders of the defined types were placed in this encounter.     Bryan Lemma, M.D., M.S. Interventional Cardiologist   Pager # [phone] Phone # [phone] [address] [place], [place] [address]

## 2017-09-20 NOTE — Patient Instructions (Addendum)
MEDICATION INSTRUCTIONS  NO CHANGES       Your physician wants you to follow-up in 12 MONTH WITH DR HARDING.You will receive a reminder letter in the mail two months in advance. If you don't receive a letter, please call our office to schedule the follow-up appointment.   If you need a refill on your cardiac medications before your next appointment, please call your pharmacy.  

## 2017-09-22 ENCOUNTER — Encounter: Payer: Self-pay | Admitting: Cardiology

## 2017-09-22 DIAGNOSIS — Z7189 Other specified counseling: Secondary | ICD-10-CM | POA: Insufficient documentation

## 2017-09-22 NOTE — Assessment & Plan Note (Signed)
Multivessel disease status post CABG has not really had any active angina since then.  He has declined any ischemic evaluation in the absence of any notable change.  In fact he probably would not want to have anything done regardless. Not on beta-blocker for COPD reasons.  He is on amlodipine for antianginal effect along with aspirin and statin.

## 2017-09-22 NOTE — Assessment & Plan Note (Signed)
Diastolic heart failure made worse by A. fib that exacerbates his COPD. Maintain stable dry weight space on his most recent log.  Using standing dose and sliding scale Lasix.  Based on his log, he seems understand quite well.

## 2017-09-22 NOTE — Assessment & Plan Note (Addendum)
On atorvastatin.  Labs followed by PCP.

## 2017-09-22 NOTE — Assessment & Plan Note (Signed)
Roberto Reilly wears a bracelet indicating DNR.  He is not sure if he actually has the paperwork signed by his PCP at home.  We talked for a few minutes about his goals of care and he clearly would not be interested in aggressive heroic measures.  I am not sure if he is quite at the state yet, but may be close to considering filling out a MOST form. I have asked that he discuss this with his PCP to ensure that he has appropriate documentation at home, And that his family is aware of his decisions.

## 2017-09-22 NOTE — Assessment & Plan Note (Signed)
A little bit irregular today, but I think he probably is in sinus rhythm.  He has not really noted any rapid beats, and that he does not really know when he is or is not in A. fib. Rate today is 90 bpm.  We talked about possibly converting from Norvasc to diltiazem if rate becomes an issue, however for now I think we will leave it alone with no real control. Continue warfarin for anticoagulation.

## 2017-10-01 ENCOUNTER — Other Ambulatory Visit: Payer: Self-pay | Admitting: Cardiology

## 2017-10-23 DIAGNOSIS — Z125 Encounter for screening for malignant neoplasm of prostate: Secondary | ICD-10-CM | POA: Diagnosis not present

## 2017-10-23 DIAGNOSIS — E785 Hyperlipidemia, unspecified: Secondary | ICD-10-CM | POA: Diagnosis not present

## 2017-10-23 DIAGNOSIS — I10 Essential (primary) hypertension: Secondary | ICD-10-CM | POA: Diagnosis not present

## 2017-10-26 DIAGNOSIS — J449 Chronic obstructive pulmonary disease, unspecified: Secondary | ICD-10-CM | POA: Diagnosis not present

## 2017-10-26 DIAGNOSIS — I10 Essential (primary) hypertension: Secondary | ICD-10-CM | POA: Diagnosis not present

## 2017-10-26 DIAGNOSIS — E7439 Other disorders of intestinal carbohydrate absorption: Secondary | ICD-10-CM | POA: Diagnosis not present

## 2017-10-26 DIAGNOSIS — R972 Elevated prostate specific antigen [PSA]: Secondary | ICD-10-CM | POA: Diagnosis not present

## 2017-10-26 DIAGNOSIS — Z79899 Other long term (current) drug therapy: Secondary | ICD-10-CM | POA: Diagnosis not present

## 2017-10-26 DIAGNOSIS — E785 Hyperlipidemia, unspecified: Secondary | ICD-10-CM | POA: Diagnosis not present

## 2017-11-06 ENCOUNTER — Telehealth: Payer: Self-pay | Admitting: Internal Medicine

## 2017-11-06 MED ORDER — BUDESONIDE-FORMOTEROL FUMARATE 160-4.5 MCG/ACT IN AERO: 2.0000 | INHALATION_SPRAY | Freq: Two times a day (BID) | RESPIRATORY_TRACT | 0 refills | Status: DC

## 2017-11-06 NOTE — Telephone Encounter (Signed)
Samples left up front for pt.  Pt aware.  Nothing further needed.  

## 2017-11-08 NOTE — Telephone Encounter (Signed)
No note needed/ error 

## 2017-11-09 ENCOUNTER — Ambulatory Visit (INDEPENDENT_AMBULATORY_CARE_PROVIDER_SITE_OTHER): Payer: Medicare Other | Admitting: Pharmacist

## 2017-11-09 DIAGNOSIS — I4891 Unspecified atrial fibrillation: Secondary | ICD-10-CM

## 2017-11-09 DIAGNOSIS — Z7901 Long term (current) use of anticoagulants: Secondary | ICD-10-CM | POA: Diagnosis not present

## 2017-11-09 LAB — POCT INR: INR: 2.2 (ref 2.0–3.0)

## 2017-11-20 ENCOUNTER — Other Ambulatory Visit: Payer: Self-pay | Admitting: Cardiology

## 2017-11-20 NOTE — Telephone Encounter (Signed)
Rx(s) sent to pharmacy electronically.  

## 2017-11-28 ENCOUNTER — Other Ambulatory Visit: Payer: Self-pay | Admitting: Cardiology

## 2017-12-07 ENCOUNTER — Encounter: Payer: Self-pay | Admitting: Internal Medicine

## 2017-12-07 ENCOUNTER — Ambulatory Visit (INDEPENDENT_AMBULATORY_CARE_PROVIDER_SITE_OTHER): Payer: Medicare Other | Admitting: Internal Medicine

## 2017-12-07 VITALS — BP 118/78 | HR 67 | Ht 72.0 in | Wt 209.0 lb

## 2017-12-07 DIAGNOSIS — J9621 Acute and chronic respiratory failure with hypoxia: Secondary | ICD-10-CM | POA: Diagnosis not present

## 2017-12-07 DIAGNOSIS — J449 Chronic obstructive pulmonary disease, unspecified: Secondary | ICD-10-CM

## 2017-12-07 DIAGNOSIS — J9622 Acute and chronic respiratory failure with hypercapnia: Secondary | ICD-10-CM | POA: Diagnosis not present

## 2017-12-07 DIAGNOSIS — I2510 Atherosclerotic heart disease of native coronary artery without angina pectoris: Secondary | ICD-10-CM | POA: Diagnosis not present

## 2017-12-07 MED ORDER — BUDESONIDE-FORMOTEROL FUMARATE 160-4.5 MCG/ACT IN AERO: 2.0000 | INHALATION_SPRAY | Freq: Two times a day (BID) | RESPIRATORY_TRACT | 0 refills | Status: DC

## 2017-12-07 MED ORDER — AMOXICILLIN 500 MG PO TABS: ORAL_TABLET | ORAL | 1 refills | Status: DC

## 2017-12-07 NOTE — Patient Instructions (Addendum)
Script printed for amoxacillin to hold in case of infection  Ok to continue Oxygen 2L  Ok to continue current meds  Please call if we can help

## 2017-12-07 NOTE — Progress Notes (Signed)
Patient ID: Roberto Reilly, male    DOB: 03-22-1934, 84 y.o.   MRN: [id number]  HPI male former smoker followed for COPD, complicated by CAD, diastolic heart failure, PAF/chronic anticoagulation, HBP, obesity, old right thoracotomy Office Spirometry 11/30/16-very severe obstruction-FVC 1.61/37%, FEV1 0.79/26%, ratio 0.49  ------------------------------------------------- 06/07/17- 84 year old male former smoker followed for COPD, complicated by CAD/CABG, diastolic heart failure, PAF/chronic anticoagulation/ Warfarin, HBP, obesity, old right thoracotomy, CKD III O2 2 L/Advanced  Nebulizer machine [facility]o[employer]eb/[facility] ----FOLLOWS FOR: COPD >> Breathing is worse since last OV. Reports SOB. Denies chest tightness, wheezing or coughing. Widowed now.  On Saturdays.  Admits he is depressed and feels he is slowly running down.  Did have flu shot. Hard for him to walk to mailbox.  He depends on [facility] for transport. CXR 02/21/17- IMPRESSION: 1. Small right pleural effusion with probable subsegmental atelectasis in the right lower lobe. 2. Mild cardiomegaly. 3. Aortic atherosclerosis.  12/07/2017- 84 year old male former smoker followed for COPD, complicated by CAD/MI/CABG, diastolic heart failure, PAF/chronic anticoagulation/ Warfarin, HBP, obesity, old right thoracotomy, CKD III O2 2 L/Advanced  Nebulizer machine [employer]eb/[facility],"  -----Breathing is becoming hard he is coughing up green mucus. CXR 1v  07/05/17- IMPRESSION: Chronic lung changes without evidence of acute abnormality.  Review of Systems- see HPI   + = positive Constitutional:   No-   weight loss, night sweats, fevers, chills, fatigue, lassitude. HEENT:   No-   headaches, difficulty swallowing, tooth/dental problems, sore throat,                  No-   sneezing, itching, ear ache,+ nasal congestion, post nasal drip,  CV:  No-   chest pain, orthopnea, PND, swelling in lower extremities, anasarca, dizziness, palpitations GI:  No-   heartburn,  indigestion, abdominal pain, nausea, vomiting,  Resp: , per HPI, +Chronic cough              No-  coughing up of blood.               change in color of mucus.  +occasional wheezing.   Skin: No-   rash or lesions. GU: . MS:  No-   joint pain or swelling.   Psych:  + change in mood or affect. + depression or anxiety.  No memory loss.   Objective:   Physical Exam    O2 3L General- Alert, Oriented, Affect-appropriate, Distress- none acute   + Overweight., + Tripod posture, +pursed lips  Skin- . + coumadin bruising Lymphadenopathy- none Head- atraumatic            Eyes- Gross vision intact, PERRLA, conjunctivae clear secretions            Ears- + Hard of hearing            Nose- Clear, No-Septal dev, mucus, polyps, erosion, perforation             Throat- Mallampati II , mucosa clear , drainage- none, tonsils- atrophic, +dentures, +hoarse Neck- flexible , trachea midline, no stridor , thyroid nl, carotid no bruit Chest - symmetrical excursion , unlabored           Heart/CV-+ IRR( no pacer) , no murmur , no gallop  , no rub, nl s1 s2                           - JVD- none , edema- none, stasis changes- none, varices- none  Lung- + diminished breath sounds,  wheeze + trace at apex , no- cough, dullness-none, rub- none   unlabored,            Chest wall- sternal scar Abd-  Br/ Gen/ Rectal- Not done, not indicated Extrem- + external varices on legs,  + stasis changes Neuro- grossly intact to observation

## 2017-12-29 NOTE — Assessment & Plan Note (Signed)
He remains oxygen dependent and is using it appropriately.  We watch with concerned that any possible exacerbation.

## 2017-12-29 NOTE — Assessment & Plan Note (Signed)
Breathing comfort varies some with weather and he does not like the current very hot summer.  We are going to give him an antibiotic to hold while he waits to see how current status develops.  He understands maintaining hydration and continuing his nebulizer treatments.

## 2018-01-02 ENCOUNTER — Ambulatory Visit (INDEPENDENT_AMBULATORY_CARE_PROVIDER_SITE_OTHER): Payer: Medicare Other | Admitting: Pharmacist

## 2018-01-02 DIAGNOSIS — Z7901 Long term (current) use of anticoagulants: Secondary | ICD-10-CM | POA: Diagnosis not present

## 2018-01-02 DIAGNOSIS — I4891 Unspecified atrial fibrillation: Secondary | ICD-10-CM

## 2018-01-02 LAB — POCT INR: INR: 2.7 (ref 2.0–3.0)

## 2018-01-30 ENCOUNTER — Other Ambulatory Visit: Payer: Self-pay | Admitting: Cardiology

## 2018-02-15 ENCOUNTER — Ambulatory Visit (INDEPENDENT_AMBULATORY_CARE_PROVIDER_SITE_OTHER): Payer: Medicare Other | Admitting: Pharmacist

## 2018-02-15 DIAGNOSIS — Z7901 Long term (current) use of anticoagulants: Secondary | ICD-10-CM | POA: Diagnosis not present

## 2018-02-15 DIAGNOSIS — I4891 Unspecified atrial fibrillation: Secondary | ICD-10-CM

## 2018-02-15 LAB — POCT INR: INR: 1.9 — AB (ref 2.0–3.0)

## 2018-03-21 DIAGNOSIS — I10 Essential (primary) hypertension: Secondary | ICD-10-CM | POA: Diagnosis not present

## 2018-03-21 DIAGNOSIS — J449 Chronic obstructive pulmonary disease, unspecified: Secondary | ICD-10-CM | POA: Diagnosis not present

## 2018-03-21 DIAGNOSIS — Z23 Encounter for immunization: Secondary | ICD-10-CM | POA: Diagnosis not present

## 2018-03-21 DIAGNOSIS — R972 Elevated prostate specific antigen [PSA]: Secondary | ICD-10-CM | POA: Diagnosis not present

## 2018-03-21 DIAGNOSIS — E785 Hyperlipidemia, unspecified: Secondary | ICD-10-CM | POA: Diagnosis not present

## 2018-03-21 DIAGNOSIS — E7439 Other disorders of intestinal carbohydrate absorption: Secondary | ICD-10-CM | POA: Diagnosis not present

## 2018-04-13 ENCOUNTER — Emergency Department (HOSPITAL_COMMUNITY): Payer: Medicare Other

## 2018-04-13 ENCOUNTER — Emergency Department (HOSPITAL_COMMUNITY)
Admission: EM | Admit: 2018-04-13 | Discharge: 2018-04-13 | Disposition: A | Discharge status: Home / Self Care | Payer: Medicare Other | Attending: Emergency Medicine | Admitting: Emergency Medicine

## 2018-04-13 ENCOUNTER — Other Ambulatory Visit: Payer: Self-pay

## 2018-04-13 ENCOUNTER — Encounter (HOSPITAL_COMMUNITY): Payer: Self-pay

## 2018-04-13 DIAGNOSIS — R0902 Hypoxemia: Secondary | ICD-10-CM | POA: Diagnosis not present

## 2018-04-13 DIAGNOSIS — I2510 Atherosclerotic heart disease of native coronary artery without angina pectoris: Secondary | ICD-10-CM | POA: Diagnosis not present

## 2018-04-13 DIAGNOSIS — R05 Cough: Secondary | ICD-10-CM | POA: Diagnosis not present

## 2018-04-13 DIAGNOSIS — Z79899 Other long term (current) drug therapy: Secondary | ICD-10-CM | POA: Diagnosis not present

## 2018-04-13 DIAGNOSIS — Z951 Presence of aortocoronary bypass graft: Secondary | ICD-10-CM | POA: Diagnosis not present

## 2018-04-13 DIAGNOSIS — R062 Wheezing: Secondary | ICD-10-CM | POA: Diagnosis not present

## 2018-04-13 DIAGNOSIS — I10 Essential (primary) hypertension: Secondary | ICD-10-CM | POA: Diagnosis not present

## 2018-04-13 DIAGNOSIS — Z87891 Personal history of nicotine dependence: Secondary | ICD-10-CM | POA: Insufficient documentation

## 2018-04-13 DIAGNOSIS — J441 Chronic obstructive pulmonary disease with (acute) exacerbation: Secondary | ICD-10-CM | POA: Insufficient documentation

## 2018-04-13 DIAGNOSIS — R0602 Shortness of breath: Secondary | ICD-10-CM | POA: Diagnosis not present

## 2018-04-13 DIAGNOSIS — I5032 Chronic diastolic (congestive) heart failure: Secondary | ICD-10-CM | POA: Diagnosis not present

## 2018-04-13 LAB — BASIC METABOLIC PANEL
Anion gap: 7 (ref 5–15)
BUN: 23 mg/dL (ref 8–23)
CO2: 35 mmol/L — ABNORMAL HIGH (ref 22–32)
Calcium: 9 mg/dL (ref 8.9–10.3)
Chloride: 99 mmol/L (ref 98–111)
Creatinine, Ser: 1.22 mg/dL (ref 0.61–1.24)
GFR calc Af Amer: >60 mL/min (ref >60)
GFR calc non Af Amer: 53 mL/min — ABNORMAL LOW (ref >60)
Glucose, Bld: 99 mg/dL (ref 70–99)
Potassium: 4.6 mmol/L (ref 3.5–5.1)
Sodium: 141 mmol/L (ref 135–145)

## 2018-04-13 LAB — BRAIN NATRIURETIC PEPTIDE: B Natriuretic Peptide: 141.8 pg/mL — ABNORMAL HIGH (ref 0.0–100.0)

## 2018-04-13 LAB — CBC WITH DIFFERENTIAL/PLATELET
Abs Immature Granulocytes: 0.03 10*3/uL (ref 0.00–0.07)
Basophils Absolute: 0 10*3/uL (ref 0.0–0.1)
Basophils Relative: 0 %
Eosinophils Absolute: 0.3 10*3/uL (ref 0.0–0.5)
Eosinophils Relative: 3 %
HCT: 43.6 % (ref 39.0–52.0)
Hemoglobin: 13.2 g/dL (ref 13.0–17.0)
Immature Granulocytes: 0 %
Lymphocytes Relative: 20 %
Lymphs Abs: 1.6 10*3/uL (ref 0.7–4.0)
MCH: 28.2 pg (ref 26.0–34.0)
MCHC: 30.3 g/dL (ref 30.0–36.0)
MCV: 93.2 fL (ref 80.0–100.0)
Monocytes Absolute: 0.8 10*3/uL (ref 0.1–1.0)
Monocytes Relative: 10 %
Neutro Abs: 5.2 10*3/uL (ref 1.7–7.7)
Neutrophils Relative %: 67 %
Platelets: 161 10*3/uL (ref 150–400)
RBC: 4.68 MIL/uL (ref 4.22–5.81)
RDW: 13.8 % (ref 11.5–15.5)
WBC: 7.8 10*3/uL (ref 4.0–10.5)
nRBC: 0 % (ref 0.0–0.2)

## 2018-04-13 LAB — PROTIME-INR
INR: 1.66
Prothrombin Time: 19.4 seconds — ABNORMAL HIGH (ref 11.4–15.2)

## 2018-04-13 MED ORDER — PREDNISONE 20 MG PO TABS: 40.0000 mg | ORAL_TABLET | Freq: Every day | ORAL | 0 refills | Status: DC

## 2018-04-13 MED ORDER — IPRATROPIUM-ALBUTEROL 0.5-2.5 (3) MG/3ML IN SOLN
3.0000 mL | Freq: Once | RESPIRATORY_TRACT | Status: AC
  Administered 2018-04-13: 3 mL via RESPIRATORY_TRACT
  Filled 2018-04-13: qty 3

## 2018-04-13 MED ORDER — METHYLPREDNISOLONE SODIUM SUCC 125 MG IJ SOLR
80.0000 mg | Freq: Once | INTRAMUSCULAR | Status: AC
Start: 2018-04-13 — End: 2018-04-13
  Administered 2018-04-13: 80 mg via INTRAVENOUS
  Filled 2018-04-13: qty 2

## 2018-04-13 MED ORDER — ALBUTEROL SULFATE (2.5 MG/3ML) 0.083% IN NEBU
5.0000 mg | INHALATION_SOLUTION | Freq: Once | RESPIRATORY_TRACT | Status: AC
  Administered 2018-04-13: 5 mg via RESPIRATORY_TRACT
  Filled 2018-04-13: qty 6

## 2018-04-13 NOTE — Discharge Instructions (Addendum)
Your INR (warfarin level) is a little low .Take an additional 1/2 dose of warfarin today. Have your INR checked next week. Take prednisone and antibiotics until finished.

## 2018-04-13 NOTE — ED Notes (Signed)
Bed: [id number] Expected date: 04/13/18 Expected time: 8:00 AM Means of arrival: Ambulance Comments: EMS- Resp distress

## 2018-04-13 NOTE — ED Triage Notes (Signed)
EMS reports from home, SOB and productive cough x 2 weeks. Pt states took albuterol neb at home 4 hours ago refused neb enroute, and refused IV. Hx of COPD  BP 128/64 HR 72 RR 20 Sp02 99 on 4 lts, Normally on 4 ltrs 24/7

## 2018-04-13 NOTE — ED Provider Notes (Signed)
[facility]-EMERGENCY DEPT Provider Note   CSN: [id number] Arrival date & time: 04/13/18  0800     History   Chief Complaint No chief complaint on file.   HPI Roberto Reilly is a 84 y.o. male.  HPI  84yM with dyspnea. Hx of COPD, CAD s/p bypass, diastolic heart failure, PAF/chronic anticoagulation, HBP, obesity.  Worsening cough over the past two weeks. He usually can clear some mucus and feel better. Over the past two days he has had difficulty with this. No pain. No fever. Stable LE edema. Reports compliance with meds. Has PRN antibiotics prescribed by pulmonologist but didn't start it. On baseline 4L O2.   Past Medical History:  Diagnosis Date  . AAA (abdominal aortic aneurysm) (HCC)    4.2 X 4.8 cm 04/2013 CT; declined re-eval 02/25/15  . Anxiety   . CAD in native artery March 2007   Cath for dyspnea on exertion: 75% distal LM, 85% RI, 95% mid-distal Cx, in multiple RCA lesions with 95% distal. --> Referred for CABG; has declined further noninvasive evaluation in the absence of worsening symptoms  . COPD (chronic obstructive pulmonary disease) with emphysema (HCC) 05/23/2007   Hosp 5/29-6/01/12- COPD exacerbation ONOX 12/08/10- desaturated to less than 88% for over an hour, qualifying for home O2 during sleep    . Diverticulosis of colon with hemorrhage 05/24/2013  . Dyslipidemia, goal LDL below 70   . History of non-ST Elevation MI (myocardial infarction) March 2007   Admitted with COPD exacerbation, given by CHF. Had some chest pain with positive troponins. Cath showed multivessel disease, referred for CABG  . History of pneumonia   . Hypertension   . Hypoxia     chronic, on home O2  . Obesity (BMI 30-39.9)    One year ago weighed 265 pounds, (12/2012) -- now 234 pounds  . PAF (paroxysmal atrial fibrillation) (HCC)    Anticoagulated on warfarin. Stable -- post op  . Pneumonia    hx  . 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  Seasonal allergies   . Shortness of breath dyspnea     Patient Active Problem List   Diagnosis Date Noted  . Counseling regarding end of life decision making 09/22/2017  . Acute respiratory failure with hypoxia (HCC) 02/21/2017  . Acute on chronic congestive heart failure (HCC) 02/21/2017  . Acute respiratory failure (HCC) 02/21/2017  . Chronic diastolic CHF (congestive heart failure), NYHA class 2 (HCC) 01/17/2017  . Acute bronchitis due to respiratory syncytial virus (RSV) 05/20/2016  . Acute on chronic respiratory failure with hypoxia and hypercapnia (HCC) 05/19/2016  . Shortness of breath 05/18/2016  . Chronic renal failure syndrome, stage 3 (moderate) (HCC) 05/18/2016  . Hypercapnia 05/18/2016  . Hyperglycemia   . Chronic osteomyelitis of right foot (HCC) 02/05/2016  . Acute and chronic respiratory failure with hypoxia (HCC)   . Abnormal TSH 11/03/2015  . Pleural effusion 11/01/2015  . Acute renal failure (ARF) (HCC) 11/01/2015  . Sepsis (HCC) 11/01/2015  . Pneumonia 11/01/2015  . COPD exacerbation (HCC) 11/01/2015  . Encounter for long-term (current) use of other medications 01/07/2014  . Nasal sinus congestion 01/07/2014  . Diverticulosis of colon with hemorrhage 05/24/2013  . Internal hemorrhoids 05/24/2013  . AAA (abdominal aortic aneurysm) without rupture seen on CT scan 05/22/2013  . Rectal bleeding 05/21/2013  . Anemia 05/20/2013  . Generalized anxiety disorder 05/09/2013  . Auditory hallucination 05/09/2013  . COPD mixed type (HCC)  05/07/2013  . COPD with acute exacerbation (HCC) 05/07/2013  . Chest discomfort 05/07/2013  . Hx of scabies 04/29/2013  . Essential hypertension 04/29/2013  . Obesity (BMI 30-39.9) 01/18/2013  . Paroxysmal atrial fibrillation (HCC); CHA2DS2-VASc Score = 4. On Warfarin   . Dyslipidemia, goal LDL below 70   . Chronic anticoagulation 08/09/2012  . Reactive depression (situational) 09/06/2011  . CAD 05/23/2007  . CAD in native artery -  LM, RCA, RI & Cx disease --> CABG x 4 (LIMA-LAD, SVG-OM/RI, SVG-rPDA-rPL) 07/21/2005    Class: History of  . S/P CABG x 4 07/21/2005    Past Surgical History:  Procedure Laterality Date  . ACHILLES TENDON LENGTHENING Right 10/08/2015   Procedure: ACHILLES TENDON LENGTHENING;  Surgeon: Toni Arthurs, MD;  Location: [facility] OR;  Service: Orthopedics;  Laterality: Right;  . AMPUTATION Right 10/08/2015   Procedure: RIGHT FOOT TRANSMET AMPUTATION;  Surgeon: Toni Arthurs, MD;  Location: [facility] OR;  Service: Orthopedics;  Laterality: Right;  . CARDIAC CATHETERIZATION  03 28 2007   NORMAL LV FUNCTION/ ABDOMINAL AORTA STENOSIS,75%-85%. RIGHT FEMORAL ARTERY :CATHETERS USED A  4-FRENCH WITH A 4-FRENCH SHEATH  . CATARACT EXTRACTION     Laser  . COLONOSCOPY N/A 05/24/2013   Procedure: COLONOSCOPY;  Surgeon: Iva Boop, MD;  Location: [facility];  Service: Endoscopy;  Laterality: N/A;  . CORONARY ARTERY BYPASS GRAFT    . RLL resection for Hamartoma     lungs  . RUL for hamartoma     lung  . TRANSTHORACIC ECHOCARDIOGRAM  12/2016   Normal LV size and function.  EF 60-65% - no RWMA.  Mild RA dilation        Home Medications    Prior to Admission medications   Medication Sig Start Date End Date Taking? Authorizing Provider  amLODipine (NORVASC) 10 MG tablet Take 1 tablet (10 mg total) by mouth daily. 05/01/13   Dorothea Ogle, MD  amoxicillin (AMOXIL) 500 MG tablet 1 twice daily 12/07/17   Jetty Duhamel D, MD  aspirin EC 81 MG tablet Take 81 mg by mouth every morning.     [provider]  atorvastatin (LIPITOR) 40 MG tablet Take 1 tablet (40 mg total) by mouth daily. 11/20/17   Marykay Lex, MD  budesonide-formoterol [facility]) 160-4.5 MCG/ACT inhaler Inhale 2 puffs into the lungs 2 (two) times daily. 12/07/17   Jetty Duhamel D, MD  cholecalciferol (VITAMIN D) 1000 UNITS tablet Take 1,000 Units by mouth every morning.     [provider]  furosemide (LASIX) 40 MG tablet Take 1 tablet  (40 mg total) daily by mouth. May take an extra 40 mg tablet daily if weight is above 213 lbs ,if needed 04/03/17   Marykay Lex, MD  ipratropium-albuterol (DUONEB) 0.5-2.5 (3) MG/3ML SOLN Take 3 mLs by nebulization every 6 (six) hours as needed (wheezing and shortness of breath).     [provider]  OXYGEN Inhale 3 L into the lungs as needed (shortness of breath).     [provider]  potassium chloride SA (K-DUR,KLOR-CON) 20 MEQ tablet TAKE 1 TABLET (20 MEQ TOTAL) BY MOUTH DAILY. 09/04/17   Marykay Lex, MD  warfarin (COUMADIN) 2.5 MG tablet TAKE ONE TO TWO TABLETS BY MOUTH AS INSTRUCTED BY COUMADIN CLINIC 01/30/18   Marykay Lex, MD    Family History Family History  Problem Relation Age of Onset  . Heart attack Mother   . Heart attack Father     Social History  Social History   Tobacco Use  . Smoking status: Former Smoker    Packs/day: 2.00    Years: 55.00    Pack years: 110.00    Types: Cigarettes    Last attempt to quit: 05/23/1998    Years since quitting: 19.9  . Smokeless tobacco: Never Used  Substance Use Topics  . Alcohol use: No  . Drug use: No     Allergies   Patient has no known allergies.   Review of Systems Review of Systems  All systems reviewed and negative, other than as noted in HPI.  Physical Exam Updated Vital Signs There were no vitals taken for this visit.  Physical Exam  Constitutional: He appears well-developed and well-nourished. No distress.  HENT:  Head: Normocephalic and atraumatic.  Eyes: Conjunctivae are normal. Right eye exhibits no discharge. Left eye exhibits no discharge.  Neck: Neck supple.  Cardiovascular: Normal rate, regular rhythm and normal heart sounds. Exam reveals no gallop and no friction rub.  No murmur heard. Pulmonary/Chest: Effort normal. No respiratory distress. He has wheezes.  B/l wheezing. Decreased breath sounds on R as compared to L.   Abdominal: Soft. He exhibits no distension.  There is no tenderness.  Musculoskeletal: He exhibits no edema or tenderness.  Neurological: He is alert.  Skin: Skin is warm and dry.  Psychiatric: He has a normal mood and affect. His behavior is normal. Thought content normal.  Nursing note and vitals reviewed.    ED Treatments / Results  Labs (all labs ordered are listed, but only abnormal results are displayed) Labs Reviewed  BASIC METABOLIC PANEL - Abnormal; Notable for the following components:      Result Value   CO2 35 (*)    GFR calc non Af Amer 53 (*)    All other components within normal limits  BRAIN NATRIURETIC PEPTIDE - Abnormal; Notable for the following components:   B Natriuretic Peptide 141.8 (*)    All other components within normal limits  PROTIME-INR - Abnormal; Notable for the following components:   Prothrombin Time 19.4 (*)    All other components within normal limits  CBC WITH DIFFERENTIAL/PLATELET    EKG EKG Interpretation  Date/Time:  Friday April 13 2018 08:27:13 EST Ventricular Rate:  67 PR Interval:    QRS Duration: 141 QT Interval:  420 QTC Calculation: 444 R Axis:   80 Text Interpretation:  Atrial fibrillation Right bundle branch block Confirmed by Raeford Razor [phone]) on 04/13/2018 9:02:05 AM   Radiology Dg Chest 2 View  Result Date: 04/13/2018 CLINICAL DATA:  Shortness of breath, productive cough EXAM: CHEST - 2 VIEW COMPARISON:  07/05/2017 FINDINGS: Prior CABG. Cardiomegaly. COPD. Blunting of the right costophrenic angle is stable, likely scarring/pleural thickening. No confluent opacities, effusions or edema. No acute bony abnormality. IMPRESSION: Cardiomegaly.  COPD/chronic changes.  No active disease. Electronically Signed   By: Charlett Nose M.D.   On: 04/13/2018 09:05    Procedures Procedures (including critical care time)  Medications Ordered in ED Medications - No data to display   Initial Impression / Assessment and Plan / ED Course  I have reviewed the triage  vital signs and the nursing notes.  Pertinent labs & imaging results that were available during my care of the patient were reviewed by me and considered in my medical decision making (see chart for details).     84yF with cough/dyspnea. Wheezing b/l with decreased breath sounds on R. Related to prior R thoracotomy? Seems reasonably comfortable on  exam. o2 sats fine on baseline 4L.  Suspect copd exacerbation. More nebs. Steroids.   Hx of HF but doesn't seem overtly volume overloaded. Mild LE edema which he reports is stable. No crackles. Check BNP and CXR.  EKG showing afib which is known. Rate fine. Will check INR.   Feeling better. Will give another neb. Had steroids.  CXR w/o overt findings. BNP mildly elevated but clinically not chf exacerbation.  INR not therapeutic. Will have take additional 1/2 dose today and recheck INR next week.       Final Clinical Impressions(s) / ED Diagnoses   Final diagnoses:  COPD exacerbation [facility](HCC)    ED Discharge Orders    None       Raeford RazorKohut, Holmes Hays, MD 04/23/18 1635

## 2018-04-18 ENCOUNTER — Telehealth: Payer: Self-pay

## 2018-04-18 NOTE — Telephone Encounter (Signed)
Called pt to schedule overdue inr and left msg

## 2018-04-20 ENCOUNTER — Telehealth: Payer: Self-pay | Admitting: Internal Medicine

## 2018-04-20 NOTE — Telephone Encounter (Signed)
What symptoms of the patient having?  Wheezing?  Fever?  Patient will most likely need an office visit to further evaluate.  Please make sure patient is scheduled next week and a 30-minute office visit appointment for a hospital follow-up with either an app or Dr. Maple HudsonYoung  If symptoms worsen in the meantime he needs to present to an emergency room, urgent care or office.   Elisha HeadlandBrian  FNP-C

## 2018-04-20 NOTE — Telephone Encounter (Signed)
Pt needs office visit for evaluation. If he cannot obtain transportation he needs to call 911 / non emergency transport to ER for further evaluation.   He needs OV for hosp follow up.   Elisha HeadlandBrian Mack FNP

## 2018-04-20 NOTE — Telephone Encounter (Signed)
Called and spoke with patient, he stated that he was seen in the hospital last week due to the weather causing him to have trouble breathing. He was given prednisone and an antibiotic. It cleared up pretty fast per patient. He now has the same symptoms due to the weather and he would like something called in. No other symptoms present.   Roberto Reilly please advise, thank you.

## 2018-04-20 NOTE — Telephone Encounter (Signed)
Called and spoke with patient, he stated that there is no wheezing and no fever. Patient stated that he does not have a way to the office for a visit. Patient stated that while is the hospital he had xrays taken that showed everything was clear he is just having trouble breathing.   [name] please advise, thank you.

## 2018-04-20 NOTE — Telephone Encounter (Signed)
Called and spoke with patient, he is aware and verbalized understanding. Patient will go t the ER. Nothing further needed.

## 2018-04-23 ENCOUNTER — Ambulatory Visit: Payer: Self-pay

## 2018-04-23 NOTE — Telephone Encounter (Signed)
Called pt to schedule overdue inr pt stated that he is seeking coumadin care at [address] with pcp

## 2018-05-07 ENCOUNTER — Other Ambulatory Visit: Payer: Self-pay | Admitting: Cardiology

## 2018-05-15 ENCOUNTER — Emergency Department (HOSPITAL_COMMUNITY): Payer: Medicare Other

## 2018-05-15 ENCOUNTER — Inpatient Hospital Stay (HOSPITAL_COMMUNITY)
Admission: EM | Admit: 2018-05-15 | Discharge: 2018-05-19 | DRG: 291 | Disposition: A | Discharge status: Home Health | Payer: Medicare Other | Attending: Internal Medicine | Admitting: Internal Medicine

## 2018-05-15 ENCOUNTER — Encounter (HOSPITAL_COMMUNITY): Payer: Self-pay | Admitting: Emergency Medicine

## 2018-05-15 DIAGNOSIS — J441 Chronic obstructive pulmonary disease with (acute) exacerbation: Secondary | ICD-10-CM | POA: Diagnosis present

## 2018-05-15 DIAGNOSIS — R05 Cough: Secondary | ICD-10-CM | POA: Diagnosis not present

## 2018-05-15 DIAGNOSIS — I130 Hypertensive heart and chronic kidney disease with heart failure and stage 1 through stage 4 chronic kidney disease, or unspecified chronic kidney disease: Secondary | ICD-10-CM | POA: Diagnosis not present

## 2018-05-15 DIAGNOSIS — Z7982 Long term (current) use of aspirin: Secondary | ICD-10-CM | POA: Diagnosis not present

## 2018-05-15 DIAGNOSIS — J9601 Acute respiratory failure with hypoxia: Secondary | ICD-10-CM | POA: Diagnosis present

## 2018-05-15 DIAGNOSIS — I480 Paroxysmal atrial fibrillation: Secondary | ICD-10-CM | POA: Diagnosis present

## 2018-05-15 DIAGNOSIS — J159 Unspecified bacterial pneumonia: Secondary | ICD-10-CM | POA: Diagnosis present

## 2018-05-15 DIAGNOSIS — Z951 Presence of aortocoronary bypass graft: Secondary | ICD-10-CM

## 2018-05-15 DIAGNOSIS — I714 Abdominal aortic aneurysm, without rupture, unspecified: Secondary | ICD-10-CM | POA: Diagnosis present

## 2018-05-15 DIAGNOSIS — J440 Chronic obstructive pulmonary disease with acute lower respiratory infection: Secondary | ICD-10-CM | POA: Diagnosis present

## 2018-05-15 DIAGNOSIS — E785 Hyperlipidemia, unspecified: Secondary | ICD-10-CM | POA: Diagnosis present

## 2018-05-15 DIAGNOSIS — Z7952 Long term (current) use of systemic steroids: Secondary | ICD-10-CM

## 2018-05-15 DIAGNOSIS — R0602 Shortness of breath: Secondary | ICD-10-CM | POA: Diagnosis not present

## 2018-05-15 DIAGNOSIS — R0902 Hypoxemia: Secondary | ICD-10-CM | POA: Diagnosis not present

## 2018-05-15 DIAGNOSIS — I5032 Chronic diastolic (congestive) heart failure: Secondary | ICD-10-CM | POA: Diagnosis present

## 2018-05-15 DIAGNOSIS — J189 Pneumonia, unspecified organism: Secondary | ICD-10-CM

## 2018-05-15 DIAGNOSIS — Z7901 Long term (current) use of anticoagulants: Secondary | ICD-10-CM

## 2018-05-15 DIAGNOSIS — J302 Other seasonal allergic rhinitis: Secondary | ICD-10-CM | POA: Diagnosis present

## 2018-05-15 DIAGNOSIS — I10 Essential (primary) hypertension: Secondary | ICD-10-CM | POA: Diagnosis not present

## 2018-05-15 DIAGNOSIS — Z87891 Personal history of nicotine dependence: Secondary | ICD-10-CM

## 2018-05-15 DIAGNOSIS — I252 Old myocardial infarction: Secondary | ICD-10-CM

## 2018-05-15 DIAGNOSIS — E669 Obesity, unspecified: Secondary | ICD-10-CM | POA: Diagnosis present

## 2018-05-15 DIAGNOSIS — I2510 Atherosclerotic heart disease of native coronary artery without angina pectoris: Secondary | ICD-10-CM | POA: Diagnosis present

## 2018-05-15 DIAGNOSIS — Z8701 Personal history of pneumonia (recurrent): Secondary | ICD-10-CM | POA: Diagnosis not present

## 2018-05-15 DIAGNOSIS — Z7951 Long term (current) use of inhaled steroids: Secondary | ICD-10-CM

## 2018-05-15 DIAGNOSIS — Z66 Do not resuscitate: Secondary | ICD-10-CM | POA: Diagnosis present

## 2018-05-15 DIAGNOSIS — R0600 Dyspnea, unspecified: Secondary | ICD-10-CM

## 2018-05-15 DIAGNOSIS — J9621 Acute and chronic respiratory failure with hypoxia: Secondary | ICD-10-CM | POA: Diagnosis present

## 2018-05-15 DIAGNOSIS — I5033 Acute on chronic diastolic (congestive) heart failure: Secondary | ICD-10-CM | POA: Diagnosis not present

## 2018-05-15 DIAGNOSIS — N183 Chronic kidney disease, stage 3 unspecified: Secondary | ICD-10-CM | POA: Diagnosis present

## 2018-05-15 DIAGNOSIS — I4510 Unspecified right bundle-branch block: Secondary | ICD-10-CM | POA: Diagnosis present

## 2018-05-15 DIAGNOSIS — Z9981 Dependence on supplemental oxygen: Secondary | ICD-10-CM

## 2018-05-15 DIAGNOSIS — R062 Wheezing: Secondary | ICD-10-CM | POA: Diagnosis not present

## 2018-05-15 DIAGNOSIS — Z79899 Other long term (current) drug therapy: Secondary | ICD-10-CM | POA: Diagnosis not present

## 2018-05-15 DIAGNOSIS — I4891 Unspecified atrial fibrillation: Secondary | ICD-10-CM | POA: Diagnosis not present

## 2018-05-15 DIAGNOSIS — J449 Chronic obstructive pulmonary disease, unspecified: Secondary | ICD-10-CM | POA: Diagnosis present

## 2018-05-15 DIAGNOSIS — R4702 Dysphasia: Secondary | ICD-10-CM | POA: Diagnosis not present

## 2018-05-15 LAB — PROTIME-INR
INR: 2.61
Prothrombin Time: 27.6 seconds — ABNORMAL HIGH (ref 11.4–15.2)

## 2018-05-15 LAB — BASIC METABOLIC PANEL
Anion gap: 12 (ref 5–15)
BUN: 33 mg/dL — ABNORMAL HIGH (ref 8–23)
CO2: 36 mmol/L — AB (ref 22–32)
Calcium: 9 mg/dL (ref 8.9–10.3)
Chloride: 94 mmol/L — ABNORMAL LOW (ref 98–111)
Creatinine, Ser: 1.32 mg/dL — ABNORMAL HIGH (ref 0.61–1.24)
GFR calc Af Amer: 58 mL/min — ABNORMAL LOW (ref >60)
GFR calc non Af Amer: 50 mL/min — ABNORMAL LOW (ref >60)
Glucose, Bld: 132 mg/dL — ABNORMAL HIGH (ref 70–99)
Potassium: 4.5 mmol/L (ref 3.5–5.1)
Sodium: 142 mmol/L (ref 135–145)

## 2018-05-15 LAB — CBC
HEMATOCRIT: 46.1 % (ref 39.0–52.0)
Hemoglobin: 13.9 g/dL (ref 13.0–17.0)
MCH: 28.7 pg (ref 26.0–34.0)
MCHC: 30.2 g/dL (ref 30.0–36.0)
MCV: 95.2 fL (ref 80.0–100.0)
Platelets: 241 10*3/uL (ref 150–400)
RBC: 4.84 MIL/uL (ref 4.22–5.81)
RDW: 14 % (ref 11.5–15.5)
WBC: 14.2 10*3/uL — ABNORMAL HIGH (ref 4.0–10.5)
nRBC: 0 % (ref 0.0–0.2)

## 2018-05-15 LAB — I-STAT CG4 LACTIC ACID, ED: Lactic Acid, Venous: 1.53 mmol/L (ref 0.5–1.9)

## 2018-05-15 LAB — INFLUENZA PANEL BY PCR (TYPE A & B)
Influenza A By PCR: NEGATIVE
Influenza B By PCR: NEGATIVE

## 2018-05-15 LAB — BRAIN NATRIURETIC PEPTIDE: B Natriuretic Peptide: 181 pg/mL — ABNORMAL HIGH (ref 0.0–100.0)

## 2018-05-15 LAB — I-STAT TROPONIN, ED: Troponin i, poc: 0.02 ng/mL (ref 0.00–0.08)

## 2018-05-15 MED ORDER — IPRATROPIUM-ALBUTEROL 0.5-2.5 (3) MG/3ML IN SOLN
3.0000 mL | Freq: Once | RESPIRATORY_TRACT | Status: AC
  Administered 2018-05-15: 3 mL via RESPIRATORY_TRACT
  Filled 2018-05-15: qty 3

## 2018-05-15 MED ORDER — SODIUM CHLORIDE 0.9 % IV SOLN
2.0000 g | Freq: Once | INTRAVENOUS | Status: AC
  Administered 2018-05-15: 2 g via INTRAVENOUS
  Filled 2018-05-15: qty 20

## 2018-05-15 MED ORDER — METHYLPREDNISOLONE SODIUM SUCC 125 MG IJ SOLR
125.0000 mg | Freq: Once | INTRAMUSCULAR | Status: AC
  Administered 2018-05-15: 125 mg via INTRAVENOUS
  Filled 2018-05-15: qty 2

## 2018-05-15 MED ORDER — ALBUTEROL SULFATE (2.5 MG/3ML) 0.083% IN NEBU
5.0000 mg | INHALATION_SOLUTION | Freq: Once | RESPIRATORY_TRACT | Status: AC
  Administered 2018-05-15: 5 mg via RESPIRATORY_TRACT
  Filled 2018-05-15: qty 6

## 2018-05-15 MED ORDER — SODIUM CHLORIDE 0.9 % IV SOLN
500.0000 mg | Freq: Once | INTRAVENOUS | Status: AC
  Administered 2018-05-16: 500 mg via INTRAVENOUS
  Filled 2018-05-15: qty 500

## 2018-05-15 NOTE — ED Notes (Signed)
ED TO INPATIENT HANDOFF REPORT  Name/Age/Gender Roberto Reilly 84 y.o. male  Code Status Code Status History    Date Active Date Inactive Code Status Order ID Comments User Context   07/05/2017 1617 07/06/2017 2333 DNR [id number]  Delaine LamePurohit, Shrey C, MD Inpatient   02/21/2017 2322 02/25/2017 2012 DNR [id number]  Eduard ClosKakrakandy, Arshad N, MD Inpatient   01/17/2017 1852 01/21/2017 1826 DNR [id number]  Narda BondsNettey, Ralph A, MD Inpatient   01/17/2017 1606 01/17/2017 1852 DNR [id number]  Marily MemosMesner, Jason, MD ED   09/26/2016 1425 09/27/2016 2320 DNR [id number]  Leatha GildingGherghe, Costin M, MD Inpatient   05/18/2016 1937 05/22/2016 1902 DNR [id number]  Vassie LollMadera, Carlos, MD ED   04/26/2016 1826 04/30/2016 1909 DNR [id number]  [name], Sendil K, MD Inpatient   11/01/2015 2251 11/05/2015 2047 DNR [id number]  Therisa Doyneoutova, Anastassia, MD Inpatient   05/20/2013 2052 05/25/2013 2148 DNR [id number]  Gery Prayrosley, Debby, MD ED   05/07/2013 1721 05/09/2013 2231 Full Code [id number]  Penny PiaVega, Orlando, MD Inpatient   04/29/2013 1636 05/01/2013 1728 Full Code [id number]  Drema DallasWoods, Curtis J, MD Inpatient    Questions for Most Recent Historical Code Status (Order [id number])    Question Answer Comment   In the event of cardiac or respiratory ARREST Do not call a "code blue"    In the event of cardiac or respiratory ARREST Do not perform Intubation, CPR, defibrillation or ACLS    In the event of cardiac or respiratory ARREST Use medication by any route, position, wound care, and other measures to relive pain and suffering. May use oxygen, suction and manual treatment of airway obstruction as needed for comfort.       Home/SNF/Other Home  Chief Complaint SOB  Level of Care/Admitting Diagnosis ED Disposition    ED Disposition Condition Comment   Admit  [facility]ospital [facility] [[id number]]  Level of Care: Telemetry [5]  Admit to tele based on following criteria: Monitor for Ischemic changes  Diagnosis: Acute respiratory failure with hypoxia [facility](HCC)  [[id number]])  [[id number]]  Admitting Physician: Eduard ClosKAKRAKANDY, ARSHAD N [phone][[id number]]  Attending Physician: Eduard ClosKAKRAKANDY, ARSHAD N [phone][[id number]]  Estimated length of stay: past midnight tomorrow  Certification:: I certify this patient will need inpatient services for at least 2 midnights  PT Class (Do Not Modify): Inpatient [101]  PT Acc Code (Do Not Modify): Private [1]       Medical History Past Medical History:  Diagnosis Date  . AAA (abdominal aortic aneurysm) (HCC)    4.2 X 4.8 cm 04/2013 CT; declined re-eval 02/25/15  . Anxiety   . CAD in native artery March 2007   Cath for dyspnea on exertion: 75% distal LM, 85% RI, 95% mid-distal Cx, in multiple RCA lesions with 95% distal. --> Referred for CABG; has declined further noninvasive evaluation in the absence of worsening symptoms  . COPD (chronic obstructive pulmonary disease) with emphysema (HCC) 05/23/2007   Hosp 5/29-6/01/12- COPD exacerbation ONOX 12/08/10- desaturated to less than 88% for over an hour, qualifying for home O2 during sleep    . Diverticulosis of colon with hemorrhage 05/24/2013  . Dyslipidemia, goal LDL below 70   . History of non-ST Elevation MI (myocardial infarction) March 2007   Admitted with COPD exacerbation, given by CHF. Had some chest pain with positive troponins. Cath showed multivessel disease, referred for CABG  . History of pneumonia   . Hypertension   . Hypoxia     chronic, on home O2  . Obesity (BMI 30-39.9)    One year ago weighed 265 pounds, (12/2012) --  now 234 pounds  . PAF (paroxysmal atrial fibrillation) (HCC)    Anticoagulated on warfarin. Stable -- post op  . Pneumonia    hx  . 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 Seasonal allergies   . Shortness of breath dyspnea     Allergies No Known Allergies  IV Location/Drains/Wounds Patient Lines/Drains/Airways Status   Active Line/Drains/Airways    Name:   Placement date:   Placement time:   Site:   Days:   Peripheral IV 05/15/18 Left;Upper Arm    05/15/18    2114    Arm   less than 1   Wound / Incision (Open or Dehisced) 01/17/17 Laceration Leg Left;Lower blackened scab over skin tear area   01/17/17    2210    Leg   483          Labs/Imaging Results for orders placed or performed during the hospital encounter of 05/15/18 (from the past 48 hour(s))  CBC     Status: Abnormal   Collection Time: 05/15/18  8:55 PM  Result Value Ref Range   WBC 14.2 (H) 4.0 - 10.5 K/uL   RBC 4.84 4.22 - 5.81 MIL/uL   Hemoglobin 13.9 13.0 - 17.0 g/dL   HCT 16.1 09.6 - 04.5 %   MCV 95.2 80.0 - 100.0 fL   MCH 28.7 26.0 - 34.0 pg   MCHC 30.2 30.0 - 36.0 g/dL   RDW 40.9 81.1 - 91.4 %   Platelets 241 150 - 400 K/uL   nRBC 0.0 0.0 - 0.2 %    Comment: Performed at [facility], [address] [place], [place] [address]  Basic metabolic panel     Status: Abnormal   Collection Time: 05/15/18  8:55 PM  Result Value Ref Range   Sodium 142 135 - 145 mmol/L   Potassium 4.5 3.5 - 5.1 mmol/L   Chloride 94 (L) 98 - 111 mmol/L   CO2 36 (H) 22 - 32 mmol/L   Glucose, Bld 132 (H) 70 - 99 mg/dL   BUN 33 (H) 8 - 23 mg/dL   Creatinine, Ser 6.21 (H) 0.61 - 1.24 mg/dL   Calcium 9.0 8.9 - 30.8 mg/dL   GFR calc non Af Amer 50 (L) >60 mL/min   GFR calc Af Amer 58 (L) >60 mL/min   Anion gap 12 5 - 15    Comment: Performed at [facility], [address] [place], [place] [address]  Protime-INR     Status: Abnormal   Collection Time: 05/15/18  8:55 PM  Result Value Ref Range   Prothrombin Time 27.6 (H) 11.4 - 15.2 seconds   INR 2.61     Comment: Performed at [facility], [address], [place], [place] [address]  Influenza panel by PCR (type A & B)     Status: None   Collection Time: 05/15/18  8:55 PM  Result Value Ref Range   Influenza A By PCR NEGATIVE NEGATIVE   Influenza B By PCR NEGATIVE NEGATIVE    Comment: (NOTE) The Xpert Xpress Flu assay is intended as an aid in the diagnosis of  influenza  and should not be used as a sole basis for treatment.  This  assay is FDA approved for nasopharyngeal swab specimens only. Nasal  washings and aspirates are unacceptable for Xpert Xpress Flu testing. Performed at [facility], [address] [place], [place] [address]  I-stat troponin, ED     Status: None   Collection Time: 05/15/18  9:21 PM  Result Value Ref Range   Troponin i, poc 0.02 0.00 - 0.08 ng/mL   Comment 3            Comment: Due to the release kinetics of cTnI, a negative result within the first hours of the onset of symptoms does not rule out myocardial infarction with certainty. If myocardial infarction is still suspected, repeat the test at appropriate intervals.   I-Stat CG4 Lactic Acid, ED     Status: None   Collection Time: 05/15/18  9:23 PM  Result Value Ref Range   Lactic Acid, Venous 1.53 0.5 - 1.9 mmol/L  Brain natriuretic peptide     Status: Abnormal   Collection Time: 05/15/18 10:08 PM  Result Value Ref Range   B Natriuretic Peptide 181.0 (H) 0.0 - 100.0 pg/mL    Comment: Performed at [facility], [address] [place], [place] [address]   Dg Chest 2 View  Result Date: 05/15/2018 CLINICAL DATA:  Acute onset of shortness of breath and productive cough. EXAM: CHEST - 2 VIEW COMPARISON:  Chest radiograph performed 04/13/2018 FINDINGS: The patient is status post right lower lobe lung resection. A small right pleural effusion is noted. Right basilar airspace opacity may reflect atelectasis or possibly mild pneumonia, given the patient's symptoms. Underlying vascular congestion is noted. No pneumothorax is seen. The cardiomediastinal silhouette is borderline normal. The patient is status post median sternotomy, with evidence of prior CABG. No acute osseous abnormalities are identified. IMPRESSION: 1. Small right pleural effusion noted. Right basilar airspace opacity may reflect atelectasis or possibly mild pneumonia, given  the patient's symptoms. 2. Underlying vascular congestion noted. Electronically Signed   By: Roanna RaiderJeffery  Chang M.D.   On: 05/15/2018 21:35    Pending Labs Unresulted Labs (From admission, onward)   None      Vitals/Pain Today's Vitals   05/15/18 2047 05/15/18 2200 05/15/18 2230 05/15/18 2300  BP:  131/74 113/64 126/64  Pulse:  83 98 80  Resp:  20 14 20   Temp:      TempSrc:      SpO2:  98% 92% 94%  PainSc: 0-No pain       Isolation Precautions Droplet precaution  Medications Medications  cefTRIAXone (ROCEPHIN) 2 g in sodium chloride 0.9 % 100 mL IVPB (2 g Intravenous New Bag/Given 05/15/18 2324)  azithromycin (ZITHROMAX) 500 mg in sodium chloride 0.9 % 250 mL IVPB (has no administration in time range)  albuterol (PROVENTIL) (2.5 MG/3ML) 0.083% nebulizer solution 5 mg (5 mg Nebulization Given 05/15/18 2049)  ipratropium-albuterol (DUONEB) 0.5-2.5 (3) MG/3ML nebulizer solution 3 mL (3 mLs Nebulization Given 05/15/18 2145)  ipratropium-albuterol (DUONEB) 0.5-2.5 (3) MG/3ML nebulizer solution 3 mL (3 mLs Nebulization Given 05/15/18 2141)  methylPREDNISolone sodium succinate (SOLU-MEDROL) 125 mg/2 mL injection 125 mg (125 mg Intravenous Given 05/15/18 2142)    Mobility walks with person assist

## 2018-05-15 NOTE — ED Notes (Signed)
Report given to Alexis, RN

## 2018-05-15 NOTE — ED Notes (Signed)
Bed: [id number] Expected date:  Expected time:  Means of arrival:  Comments: EMS 84 yo male SOB with productive cough x 1 week-O2 sat 80's-NRB-atrial fib

## 2018-05-15 NOTE — ED Triage Notes (Signed)
Arrived via EMS from home.  -C/C SOB, productive cough for x1 week. Over the past week the cough has progressed from clear and frothy to thick and green. -Patient is on a blood thinner, no Hx stroke or DVT. Hx of COPD, A-fib with a rate 60-90s, Hx CHF  -Per EMS, Rhonchi in all fields, mostly left lobes.  -Sats on 4 L O2 [place], wears at home, on neb at 93%, normally around 88-89% O2 -1 duoNEB with EMS  BP 136/70 -60-90s HR -Sat on Neb 93 -RR 26

## 2018-05-15 NOTE — ED Provider Notes (Addendum)
[facility] Emergency Department Provider Note MRN:  [id number]  Arrival date & time: 05/15/18     Chief Complaint   Shortness of Breath   History of Present Illness   Roberto Reilly is a 84 y.o. year-old male with a history of AAA, CAD, COPD presenting to the ED with chief complaint of shortness of breath.  1 week of persistent cough, initially dry, then productive of white sputum, now productive of green/yellow sputum.  4 to 5 days of progressively worsening shortness of breath.  Unable to sleep last night due to symptoms.  Symptoms are moderate, constant.  Denies headache or vision change, no chest pain, no abdominal pain, no dysuria.  No sick contacts.  Review of Systems  A complete 10 system review of systems was obtained and all systems are negative except as noted in the HPI and PMH.   Patient's Health History    Past Medical History:  Diagnosis Date  . AAA (abdominal aortic aneurysm) (HCC)    4.2 X 4.8 cm 04/2013 CT; declined re-eval 02/25/15  . Anxiety   . CAD in native artery March 2007   Cath for dyspnea on exertion: 75% distal LM, 85% RI, 95% mid-distal Cx, in multiple RCA lesions with 95% distal. --> Referred for CABG; has declined further noninvasive evaluation in the absence of worsening symptoms  . COPD (chronic obstructive pulmonary disease) with emphysema (HCC) 05/23/2007   Hosp 5/29-6/01/12- COPD exacerbation ONOX 12/08/10- desaturated to less than 88% for over an hour, qualifying for home O2 during sleep    . Diverticulosis of colon with hemorrhage 05/24/2013  . Dyslipidemia, goal LDL below 70   . History of non-ST Elevation MI (myocardial infarction) March 2007   Admitted with COPD exacerbation, given by CHF. Had some chest pain with positive troponins. Cath showed multivessel disease, referred for CABG  . History of pneumonia   . Hypertension   . Hypoxia     chronic, on home O2  . Obesity (BMI 30-39.9)    One year ago weighed 265 pounds,  (12/2012) -- now 234 pounds  . PAF (paroxysmal atrial fibrillation) (HCC)    Anticoagulated on warfarin. Stable -- post op  . Pneumonia    hx  . 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 Seasonal allergies   . Shortness of breath dyspnea     Past Surgical History:  Procedure Laterality Date  . ACHILLES TENDON LENGTHENING Right 10/08/2015   Procedure: ACHILLES TENDON LENGTHENING;  Surgeon: Toni Arthurs, MD;  Location: [facility] OR;  Service: Orthopedics;  Laterality: Right;  . AMPUTATION Right 10/08/2015   Procedure: RIGHT FOOT TRANSMET AMPUTATION;  Surgeon: Toni Arthurs, MD;  Location: [facility] OR;  Service: Orthopedics;  Laterality: Right;  . CARDIAC CATHETERIZATION  03 28 2007   NORMAL LV FUNCTION/ ABDOMINAL AORTA STENOSIS,75%-85%. RIGHT FEMORAL ARTERY :CATHETERS USED A  4-FRENCH WITH A 4-FRENCH SHEATH  . CATARACT EXTRACTION     Laser  . COLONOSCOPY N/A 05/24/2013   Procedure: COLONOSCOPY;  Surgeon: Iva Boop, MD;  Location: [facility] ENDOSCOPY;  Service: Endoscopy;  Laterality: N/A;  . CORONARY ARTERY BYPASS GRAFT    . RLL resection for Hamartoma     lungs  . RUL for hamartoma     lung  . TRANSTHORACIC ECHOCARDIOGRAM  12/2016   Normal LV size and function.  EF 60-65% - no RWMA.  Mild RA dilation    Family History  Problem Relation Age of Onset  .  Heart attack Mother   . Heart attack Father     Social History   Socioeconomic History  . Marital status: Married    Spouse name: Not on file  . Number of children: 2  . Years of education: Not on file  . Highest education level: Not on file  Occupational History  . Occupation: Physicist, [employer]  Social Needs  . Financial resource strain: Not on file  . Food insecurity:    Worry: Not on file    Inability: Not on file  . Transportation needs:    Medical: Not on file    Non-medical: Not on file  Tobacco Use  . Smoking status: Former Smoker    Packs/day: 2.00    Years: 55.00    Pack years: 110.00    Types: Cigarettes     Last attempt to quit: 05/23/1998    Years since quitting: 19.9  . Smokeless tobacco: Never Used  Substance and Sexual Activity  . Alcohol use: No  . Drug use: No  . Sexual activity: Not on file  Lifestyle  . Physical activity:    Days per week: Not on file    Minutes per session: Not on file  . Stress: Not on file  Relationships  . Social connections:    Talks on phone: Not on file    Gets together: Not on file    Attends religious service: Not on file    Active member of club or organization: Not on file    Attends meetings of clubs or organizations: Not on file    Relationship status: Not on file  . Intimate partner violence:    Fear of current or ex partner: Not on file    Emotionally abused: Not on file    Physically abused: Not on file    Forced sexual activity: Not on file  Other Topics Concern  . Not on file  Social History Narrative   He is a married father of 2, grandfather of 4113, does not get routine exercise. He is a former smoker, but does not currently or not drink alcohol.    He is modifying his diet and trying to get activity but is doing better with the diet than the activity.    He is under a lot of stress.    He was the caregiver for his wife, who was in poor health. She died 10/25/2015.   Wears DNR bracelet.      Physical Exam  Vital Signs and Nursing Notes reviewed Vitals:   05/15/18 2039 05/15/18 2200  BP:  131/74  Pulse:  83  Resp:  20  Temp:    SpO2: (!) 89% 98%    CONSTITUTIONAL: Chronically ill-appearing, NAD NEURO:  Alert and oriented x 3, no focal deficits EYES:  eyes equal and reactive ENT/NECK:  no LAD, no JVD CARDIO: Regular rate, well-perfused, normal S1 and S2 PULM: Scattered rhonchi, poor air movement GI/GU:  normal bowel sounds, non-distended, non-tender MSK/SPINE:  No gross deformities, no edema SKIN:  no rash, atraumatic PSYCH:  Appropriate speech and behavior  Diagnostic and Interventional Summary    EKG  Interpretation  Date/Time:  Tuesday May 15 2018 20:37:24 EST Ventricular Rate:  93 PR Interval:    QRS Duration: 135 QT Interval:  394 QTC Calculation: 491 R Axis:   92 Text Interpretation:  Atrial fibrillation RBBB and LPFB Confirmed by Kennis CarinaBero, Blaiden Werth [phone]([id number]) on 05/15/2018 8:50:42 PM      Labs Reviewed  CBC - Abnormal;  Notable for the following components:      Result Value   WBC 14.2 (*)    All other components within normal limits  BASIC METABOLIC PANEL - Abnormal; Notable for the following components:   Chloride 94 (*)    CO2 36 (*)    Glucose, Bld 132 (*)    BUN 33 (*)    Creatinine, Ser 1.32 (*)    GFR calc non Af Amer 50 (*)    GFR calc Af Amer 58 (*)    All other components within normal limits  PROTIME-INR - Abnormal; Notable for the following components:   Prothrombin Time 27.6 (*)    All other components within normal limits  INFLUENZA PANEL BY PCR (TYPE A & B)  BRAIN NATRIURETIC PEPTIDE  I-STAT TROPONIN, ED  I-STAT CG4 LACTIC ACID, ED    DG Chest 2 View  Final Result      Medications  cefTRIAXone (ROCEPHIN) 2 g in sodium chloride 0.9 % 100 mL IVPB (has no administration in time range)  azithromycin (ZITHROMAX) 500 mg in sodium chloride 0.9 % 250 mL IVPB (has no administration in time range)  albuterol (PROVENTIL) (2.5 MG/3ML) 0.083% nebulizer solution 5 mg (5 mg Nebulization Given 05/15/18 2049)  ipratropium-albuterol (DUONEB) 0.5-2.5 (3) MG/3ML nebulizer solution 3 mL (3 mLs Nebulization Given 05/15/18 2145)  ipratropium-albuterol (DUONEB) 0.5-2.5 (3) MG/3ML nebulizer solution 3 mL (3 mLs Nebulization Given 05/15/18 2141)  methylPREDNISolone sodium succinate (SOLU-MEDROL) 125 mg/2 mL injection 125 mg (125 mg Intravenous Given 05/15/18 2142)     Procedures Critical Care  ED Course and Medical Decision Making  I have reviewed the triage vital signs and the nursing notes.  Pertinent labs & imaging results that were available during my care of the  patient were reviewed by me and considered in my medical decision making (see below for details).  Concern for pneumonia versus COPD exacerbation in this [age] year old male with history of COPD.  O2 saturations in the low 80s on his home 4 L nasal cannula, increased to 5.  Anticipating admission.  Given CAP coverage.  Chest x-ray showing evidence of pneumonia.  Will admit to hospitalist service.  Elmer SowMichael M. Pilar PlateBero, MD [facility] [email or web address]  Final Clinical Impressions(s) / ED Diagnoses     ICD-10-CM   1. Community acquired pneumonia, unspecified laterality J18.9     ED Discharge Orders    None         Sabas SousBero, Arbie Reisz M, MD 05/15/18 2253    Sabas SousBero, Trey Bebee M, MD 05/15/18 [phone]

## 2018-05-15 NOTE — ED Notes (Signed)
Patient transported to X-ray 

## 2018-05-15 NOTE — Progress Notes (Signed)
ANTICOAGULATION CONSULT NOTE - Initial Consult  Pharmacy Consult for Warfarin Indication: atrial fibrillation  No Known Allergies       Vital Signs: Temp: 98.2 F (36.8 C) (12/24 2034) Temp Source: Oral (12/24 2034) BP: 126/64 (12/24 2300) Pulse Rate: 80 (12/24 2300)  Labs: Recent Labs    05/15/18 2055  HGB 13.9  HCT 46.1  PLT 241  LABPROT 27.6*  INR 2.61  CREATININE 1.32*    CrCl cannot be calculated (Unknown ideal weight.).   Medical History: Past Medical History:  Diagnosis Date  . AAA (abdominal aortic aneurysm) (HCC)    4.2 X 4.8 cm 04/2013 CT; declined re-eval 02/25/15  . Anxiety   . CAD in native artery March 2007   Cath for dyspnea on exertion: 75% distal LM, 85% RI, 95% mid-distal Cx, in multiple RCA lesions with 95% distal. --> Referred for CABG; has declined further noninvasive evaluation in the absence of worsening symptoms  . COPD (chronic obstructive pulmonary disease) with emphysema (HCC) 05/23/2007   Hosp 5/29-6/01/12- COPD exacerbation ONOX 12/08/10- desaturated to less than 88% for over an hour, qualifying for home O2 during sleep    . Diverticulosis of colon with hemorrhage 05/24/2013  . Dyslipidemia, goal LDL below 70   . History of non-ST Elevation MI (myocardial infarction) March 2007   Admitted with COPD exacerbation, given by CHF. Had some chest pain with positive troponins. Cath showed multivessel disease, referred for CABG  . History of pneumonia   . Hypertension   . Hypoxia     chronic, on home O2  . Obesity (BMI 30-39.9)    One year ago weighed 265 pounds, (12/2012) -- now 234 pounds  . PAF (paroxysmal atrial fibrillation) (HCC)    Anticoagulated on warfarin. Stable -- post op  . Pneumonia    hx  . 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 Seasonal allergies   . Shortness of breath dyspnea     Medications:  Scheduled:   Infusions:  . azithromycin    . cefTRIAXone (ROCEPHIN)  IV 2 g (05/15/18 2324)     Assessment: [age] yoM with hx of AAA, CAD, COPD on chronic warfarin for A-fib now with SOB. HD 2.5 mg Su/Tu/Th and 5 mg M/W/F/S.  LD 12/24 INR=2.61 on admission.   Goal of Therapy:  INR 2-3    Plan:  Daily PT/INR  Susanne GreenhouseGreen, Clelia Trabucco R 05/15/2018,11:31 PM

## 2018-05-16 ENCOUNTER — Other Ambulatory Visit: Payer: Self-pay

## 2018-05-16 ENCOUNTER — Encounter (HOSPITAL_COMMUNITY): Payer: Self-pay | Admitting: Internal Medicine

## 2018-05-16 DIAGNOSIS — J449 Chronic obstructive pulmonary disease, unspecified: Secondary | ICD-10-CM

## 2018-05-16 DIAGNOSIS — J189 Pneumonia, unspecified organism: Secondary | ICD-10-CM

## 2018-05-16 DIAGNOSIS — I5032 Chronic diastolic (congestive) heart failure: Secondary | ICD-10-CM

## 2018-05-16 DIAGNOSIS — J9601 Acute respiratory failure with hypoxia: Secondary | ICD-10-CM

## 2018-05-16 LAB — CBC WITH DIFFERENTIAL/PLATELET
Abs Immature Granulocytes: 0.08 10*3/uL — ABNORMAL HIGH (ref 0.00–0.07)
BASOS PCT: 0 %
Basophils Absolute: 0 10*3/uL (ref 0.0–0.1)
EOS ABS: 0 10*3/uL (ref 0.0–0.5)
Eosinophils Relative: 0 %
HCT: 41.4 % (ref 39.0–52.0)
Hemoglobin: 12.4 g/dL — ABNORMAL LOW (ref 13.0–17.0)
Immature Granulocytes: 1 %
Lymphocytes Relative: 5 %
Lymphs Abs: 0.6 10*3/uL — ABNORMAL LOW (ref 0.7–4.0)
MCH: 28.6 pg (ref 26.0–34.0)
MCHC: 30 g/dL (ref 30.0–36.0)
MCV: 95.4 fL (ref 80.0–100.0)
Monocytes Absolute: 0.2 10*3/uL (ref 0.1–1.0)
Monocytes Relative: 2 %
Neutro Abs: 11.1 10*3/uL — ABNORMAL HIGH (ref 1.7–7.7)
Neutrophils Relative %: 92 %
Platelets: 192 10*3/uL (ref 150–400)
RBC: 4.34 MIL/uL (ref 4.22–5.81)
RDW: 13.9 % (ref 11.5–15.5)
WBC: 12 10*3/uL — ABNORMAL HIGH (ref 4.0–10.5)
nRBC: 0 % (ref 0.0–0.2)

## 2018-05-16 LAB — COMPREHENSIVE METABOLIC PANEL
ALT: 18 U/L (ref 0–44)
ANION GAP: 11 (ref 5–15)
AST: 24 U/L (ref 15–41)
Albumin: 3.5 g/dL (ref 3.5–5.0)
Alkaline Phosphatase: 61 U/L (ref 38–126)
BUN: 35 mg/dL — ABNORMAL HIGH (ref 8–23)
CALCIUM: 8.5 mg/dL — AB (ref 8.9–10.3)
CO2: 34 mmol/L — ABNORMAL HIGH (ref 22–32)
Chloride: 96 mmol/L — ABNORMAL LOW (ref 98–111)
Creatinine, Ser: 1.39 mg/dL — ABNORMAL HIGH (ref 0.61–1.24)
GFR calc Af Amer: 54 mL/min — ABNORMAL LOW (ref >60)
GFR, EST NON AFRICAN AMERICAN: 47 mL/min — AB (ref >60)
Glucose, Bld: 170 mg/dL — ABNORMAL HIGH (ref 70–99)
Potassium: 5.3 mmol/L — ABNORMAL HIGH (ref 3.5–5.1)
Sodium: 141 mmol/L (ref 135–145)
Total Bilirubin: 0.6 mg/dL (ref 0.3–1.2)
Total Protein: 6.5 g/dL (ref 6.5–8.1)

## 2018-05-16 LAB — STREP PNEUMONIAE URINARY ANTIGEN: Strep Pneumo Urinary Antigen: NEGATIVE

## 2018-05-16 LAB — EXPECTORATED SPUTUM ASSESSMENT W REFEX TO RESP CULTURE

## 2018-05-16 LAB — EXPECTORATED SPUTUM ASSESSMENT W GRAM STAIN, RFLX TO RESP C

## 2018-05-16 LAB — PROTIME-INR
INR: 2.38
Prothrombin Time: 25.7 seconds — ABNORMAL HIGH (ref 11.4–15.2)

## 2018-05-16 LAB — TROPONIN I
Troponin I: <0.03 ng/mL (ref <0.03)
Troponin I: <0.03 ng/mL (ref <0.03)
Troponin I: <0.03 ng/mL (ref <0.03)

## 2018-05-16 LAB — MAGNESIUM: Magnesium: 2.6 mg/dL — ABNORMAL HIGH (ref 1.7–2.4)

## 2018-05-16 MED ORDER — IPRATROPIUM-ALBUTEROL 0.5-2.5 (3) MG/3ML IN SOLN
3.0000 mL | RESPIRATORY_TRACT | Status: DC | PRN
  Administered 2018-05-16: 3 mL via RESPIRATORY_TRACT
  Filled 2018-05-16: qty 3

## 2018-05-16 MED ORDER — ATORVASTATIN CALCIUM 40 MG PO TABS
40.0000 mg | ORAL_TABLET | Freq: Every day | ORAL | Status: DC
  Administered 2018-05-16 – 2018-05-18 (×3): 40 mg via ORAL
  Filled 2018-05-16 (×3): qty 1

## 2018-05-16 MED ORDER — POTASSIUM CHLORIDE CRYS ER 20 MEQ PO TBCR
20.0000 meq | EXTENDED_RELEASE_TABLET | Freq: Every day | ORAL | Status: DC
  Administered 2018-05-16 – 2018-05-19 (×4): 20 meq via ORAL
  Filled 2018-05-16 (×4): qty 1

## 2018-05-16 MED ORDER — IPRATROPIUM-ALBUTEROL 0.5-2.5 (3) MG/3ML IN SOLN
3.0000 mL | Freq: Four times a day (QID) | RESPIRATORY_TRACT | Status: DC
  Administered 2018-05-17 – 2018-05-19 (×11): 3 mL via RESPIRATORY_TRACT
  Filled 2018-05-16 (×11): qty 3

## 2018-05-16 MED ORDER — FUROSEMIDE 10 MG/ML IJ SOLN
40.0000 mg | Freq: Once | INTRAMUSCULAR | Status: AC
  Administered 2018-05-16: 40 mg via INTRAVENOUS
  Filled 2018-05-16: qty 4

## 2018-05-16 MED ORDER — SODIUM CHLORIDE 0.9 % IV SOLN
500.0000 mg | INTRAVENOUS | Status: DC
  Administered 2018-05-16 – 2018-05-18 (×3): 500 mg via INTRAVENOUS
  Filled 2018-05-16 (×4): qty 500

## 2018-05-16 MED ORDER — ASPIRIN EC 81 MG PO TBEC
81.0000 mg | DELAYED_RELEASE_TABLET | Freq: Every day | ORAL | Status: DC
  Administered 2018-05-16 – 2018-05-19 (×4): 81 mg via ORAL
  Filled 2018-05-16 (×4): qty 1

## 2018-05-16 MED ORDER — ONDANSETRON HCL 4 MG/2ML IJ SOLN: 4.0000 mg | Freq: Four times a day (QID) | INTRAMUSCULAR | Status: DC | PRN

## 2018-05-16 MED ORDER — WARFARIN - PHARMACIST DOSING INPATIENT
Freq: Every day | Status: DC
  Administered 2018-05-17: 18:00:00

## 2018-05-16 MED ORDER — FUROSEMIDE 40 MG PO TABS
40.0000 mg | ORAL_TABLET | Freq: Every day | ORAL | Status: DC
  Administered 2018-05-16 – 2018-05-17 (×2): 40 mg via ORAL
  Filled 2018-05-16 (×3): qty 1

## 2018-05-16 MED ORDER — FUROSEMIDE 40 MG PO TABS: 40.0000 mg | ORAL_TABLET | ORAL | Status: DC

## 2018-05-16 MED ORDER — ACETAMINOPHEN 325 MG PO TABS: 650.0000 mg | ORAL_TABLET | Freq: Four times a day (QID) | ORAL | Status: DC | PRN

## 2018-05-16 MED ORDER — WARFARIN SODIUM 5 MG PO TABS
5.0000 mg | ORAL_TABLET | Freq: Once | ORAL | Status: AC
  Administered 2018-05-16: 5 mg via ORAL
  Filled 2018-05-16: qty 1

## 2018-05-16 MED ORDER — AMLODIPINE BESYLATE 10 MG PO TABS
10.0000 mg | ORAL_TABLET | Freq: Every day | ORAL | Status: DC
  Administered 2018-05-16 – 2018-05-19 (×4): 10 mg via ORAL
  Filled 2018-05-16 (×4): qty 1

## 2018-05-16 MED ORDER — MOMETASONE FURO-FORMOTEROL FUM 200-5 MCG/ACT IN AERO
2.0000 | INHALATION_SPRAY | Freq: Two times a day (BID) | RESPIRATORY_TRACT | Status: DC
  Administered 2018-05-16 – 2018-05-19 (×7): 2 via RESPIRATORY_TRACT
  Filled 2018-05-16: qty 8.8

## 2018-05-16 MED ORDER — ONDANSETRON HCL 4 MG PO TABS: 4.0000 mg | ORAL_TABLET | Freq: Four times a day (QID) | ORAL | Status: DC | PRN

## 2018-05-16 MED ORDER — ACETAMINOPHEN 650 MG RE SUPP: 650.0000 mg | Freq: Four times a day (QID) | RECTAL | Status: DC | PRN

## 2018-05-16 MED ORDER — SODIUM CHLORIDE 0.9 % IV SOLN
1.0000 g | INTRAVENOUS | Status: DC
  Administered 2018-05-16 – 2018-05-18 (×3): 1 g via INTRAVENOUS
  Filled 2018-05-16 (×2): qty 1
  Filled 2018-05-16: qty 10
  Filled 2018-05-16: qty 1

## 2018-05-16 MED ORDER — WARFARIN SODIUM 5 MG PO TABS: 5.0000 mg | ORAL_TABLET | Freq: Once | ORAL | Status: DC

## 2018-05-16 MED ORDER — METHYLPREDNISOLONE SODIUM SUCC 40 MG IJ SOLR
40.0000 mg | Freq: Two times a day (BID) | INTRAMUSCULAR | Status: DC
  Administered 2018-05-16 – 2018-05-17 (×4): 40 mg via INTRAVENOUS
  Filled 2018-05-16 (×5): qty 1

## 2018-05-16 MED ORDER — ORAL CARE MOUTH RINSE
15.0000 mL | Freq: Two times a day (BID) | OROMUCOSAL | Status: DC
  Administered 2018-05-16 – 2018-05-19 (×7): 15 mL via OROMUCOSAL

## 2018-05-16 NOTE — Progress Notes (Signed)
ANTICOAGULATION CONSULT NOTE - Follow up  Pharmacy Consult for Warfarin Indication: atrial fibrillation  No Known Allergies       Vital Signs: Temp: 97.8 F (36.6 C) (12/25 0651) Temp Source: Oral (12/25 0651) BP: 127/67 (12/25 0651) Pulse Rate: 44 (12/25 0651)  Labs: Recent Labs    05/15/18 2055 05/16/18 0345 05/16/18 0425  HGB 13.9  --  12.4*  HCT 46.1  --  41.4  PLT 241  --  192  LABPROT 27.6* 25.7*  --   INR 2.61 2.38  --   CREATININE 1.32*  --  1.39*  TROPONINI  --   --  <0.03    CrCl cannot be calculated (Unknown ideal weight.).   Medical History: Past Medical History:  Diagnosis Date  . AAA (abdominal aortic aneurysm) (HCC)    4.2 X 4.8 cm 04/2013 CT; declined re-eval 02/25/15  . Anxiety   . CAD in native artery March 2007   Cath for dyspnea on exertion: 75% distal LM, 85% RI, 95% mid-distal Cx, in multiple RCA lesions with 95% distal. --> Referred for CABG; has declined further noninvasive evaluation in the absence of worsening symptoms  . COPD (chronic obstructive pulmonary disease) with emphysema (HCC) 05/23/2007   Hosp 5/29-6/01/12- COPD exacerbation ONOX 12/08/10- desaturated to less than 88% for over an hour, qualifying for home O2 during sleep    . Diverticulosis of colon with hemorrhage 05/24/2013  . Dyslipidemia, goal LDL below 70   . History of non-ST Elevation MI (myocardial infarction) March 2007   Admitted with COPD exacerbation, given by CHF. Had some chest pain with positive troponins. Cath showed multivessel disease, referred for CABG  . History of pneumonia   . Hypertension   . Hypoxia     chronic, on home O2  . Obesity (BMI 30-39.9)    One year ago weighed 265 pounds, (12/2012) -- now 234 pounds  . PAF (paroxysmal atrial fibrillation) (HCC)    Anticoagulated on warfarin. Stable -- post op  . Pneumonia    hx  . 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 Seasonal allergies   . Shortness of breath dyspnea      Medications:  Scheduled:  . amLODipine  10 mg Oral Daily  . aspirin EC  81 mg Oral Daily  . atorvastatin  40 mg Oral q1800  . furosemide  40 mg Oral Daily  . furosemide  40 mg Oral Q M,W,F,Sa-1800  . mometasone-formoterol  2 puff Inhalation BID  . potassium chloride SA  20 mEq Oral Daily   Infusions:  . azithromycin    . cefTRIAXone (ROCEPHIN)  IV      Assessment: [age] yoM with hx of AAA, CAD, COPD on chronic warfarin for A-fib now with SOB. HD 2.5 mg Su/Tu/Th and 5 mg M/W/F/S.  LD 12/24 INR=2.61 on admission.    05/16/2018 INR 2.38, therapeutic H/H WNL DI: azithromycin>> Hypoprothrombinemic effects of warfarin may be increased   Goal of Therapy:  INR 2-3   Plan:  Warfarin 5mg  x1 at 1800 Daily PT/INR  Arley PhenixEllen Shawny Borkowski RPh 05/16/2018, 9:27 AM Pager [phone]

## 2018-05-16 NOTE — Progress Notes (Signed)
Patient ID: Roberto Reilly, male   DOB: 11/25/1933, 84 y.o.   MRN: [id number] Patient was admitted early this morning for worsening shortness of breath and started on intravenous antibiotics for probable pneumonia.  He also got a dose of Lasix.  Patient seen and examined at bedside.  Plan of care discussed with him.  I have reviewed medical records including this morning's H&P myself.  Continue antibiotics.  He is slightly wheezing.  Will start Solu-Medrol 40 mg IV every 12 hours.  Repeat a.m. labs.

## 2018-05-16 NOTE — H&P (Signed)
History and Physical    Roberto ChristmasJames Marlette ZOX:[id number] DOB: 09/02/1933 DOA: 05/15/2018  PCP: Pearson GrippeKim, Gian, MD  Patient coming from: Home.  Chief Complaint: Shortness of breath.  HPI: Roberto Reilly is a 84 y.o. male with history of CAD status post CABG, diastolic CHF last EF measured was 60 to 65% in August 2018, chronic kidney disease stage II baseline creatinine around 1.3, atrial fibrillation, COPD on home oxygen presents to the ER because of shortness of breath.  Patient has been having shortness of breath with productive cough last 1 week which is worsening on exertion.  Denies any chest pain but has been having upper back pain off and on with exertional shortness of breath.  Patient sputum is greenish discoloration.  Had some subjective feeling of fever chills.  ED Course: In the ER chest x-ray shows features concerning for pneumonia and pleural effusion.  EKG shows A. fib rate controlled with RBBB.  BNP was 181.  Troponin was 0.02.  Patient was started on empiric antibiotics for pneumonia and admitted for acute respiratory failure with hypoxia since patient is requiring more than baseline oxygen of 3 L and is presently on 5 L.  On exam patient also looks mildly fluid overloaded for which I have ordered Lasix 40 mg IV 1 dose.  Patient states he takes Lasix as advised by his primary care physician has not missed his dose.  Review of Systems: As per HPI, rest all negative.   Past Medical History:  Diagnosis Date  . AAA (abdominal aortic aneurysm) (HCC)    4.2 X 4.8 cm 04/2013 CT; declined re-eval 02/25/15  . Anxiety   . CAD in native artery March 2007   Cath for dyspnea on exertion: 75% distal LM, 85% RI, 95% mid-distal Cx, in multiple RCA lesions with 95% distal. --> Referred for CABG; has declined further noninvasive evaluation in the absence of worsening symptoms  . COPD (chronic obstructive pulmonary disease) with emphysema (HCC) 05/23/2007   Hosp 5/29-6/01/12- COPD exacerbation ONOX  12/08/10- desaturated to less than 88% for over an hour, qualifying for home O2 during sleep    . Diverticulosis of colon with hemorrhage 05/24/2013  . Dyslipidemia, goal LDL below 70   . History of non-ST Elevation MI (myocardial infarction) March 2007   Admitted with COPD exacerbation, given by CHF. Had some chest pain with positive troponins. Cath showed multivessel disease, referred for CABG  . History of pneumonia   . Hypertension   . Hypoxia     chronic, on home O2  . Obesity (BMI 30-39.9)    One year ago weighed 265 pounds, (12/2012) -- now 234 pounds  . PAF (paroxysmal atrial fibrillation) (HCC)    Anticoagulated on warfarin. Stable -- post op  . Pneumonia    hx  . 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 Seasonal allergies   . Shortness of breath dyspnea     Past Surgical History:  Procedure Laterality Date  . ACHILLES TENDON LENGTHENING Right 10/08/2015   Procedure: ACHILLES TENDON LENGTHENING;  Surgeon: Toni ArthursJohn Hewitt, MD;  Location: [facility] OR;  Service: Orthopedics;  Laterality: Right;  . AMPUTATION Right 10/08/2015   Procedure: RIGHT FOOT TRANSMET AMPUTATION;  Surgeon: Toni ArthursJohn Hewitt, MD;  Location: [facility] OR;  Service: Orthopedics;  Laterality: Right;  . CARDIAC CATHETERIZATION  03 28 2007   NORMAL LV FUNCTION/ ABDOMINAL AORTA STENOSIS,75%-85%. RIGHT FEMORAL ARTERY :CATHETERS USED A  4-FRENCH WITH A 4-FRENCH SHEATH  . CATARACT  EXTRACTION     Laser  . COLONOSCOPY N/A 05/24/2013   Procedure: COLONOSCOPY;  Surgeon: Iva Booparl E Gessner, MD;  Location: [facility];  Service: Endoscopy;  Laterality: N/A;  . CORONARY ARTERY BYPASS GRAFT    . RLL resection for Hamartoma     lungs  . RUL for hamartoma     lung  . TRANSTHORACIC ECHOCARDIOGRAM  12/2016   Normal LV size and function.  EF 60-65% - no RWMA.  Mild RA dilation     reports that he quit smoking about 19 years ago. His smoking use included cigarettes. He has a 110.00 pack-year smoking history. He has never used  smokeless tobacco. He reports that he does not drink alcohol or use drugs.  No Known Allergies  Family History  Problem Relation Age of Onset  . Heart attack Mother   . Heart attack Father     Prior to Admission medications   Medication Sig Start Date End Date Taking? Authorizing Provider  amLODipine (NORVASC) 10 MG tablet Take 1 tablet (10 mg total) by mouth daily. 05/01/13  Yes [name], Iskra M, MD  aspirin EC 81 MG tablet Take 81 mg by mouth every morning.    Yes [provider]  atorvastatin (LIPITOR) 40 MG tablet Take 1 tablet (40 mg total) by mouth daily. 11/20/17  Yes Marykay LexHarding, David W, MD  budesonide-formoterol [facility](SYMBICORT) 160-4.5 MCG/ACT inhaler Inhale 2 puffs into the lungs 2 (two) times daily. 12/07/17  Yes Young, Joni Fearslinton D, MD  cholecalciferol (VITAMIN D) 1000 UNITS tablet Take 1,000 Units by mouth every morning.    Yes [provider]  furosemide (LASIX) 40 MG tablet Take 1 tablet (40 mg total) daily by mouth. May take an extra 40 mg tablet daily if weight is above 213 lbs ,if needed 04/03/17  Yes Marykay LexHarding, David W, MD  ipratropium-albuterol (DUONEB) 0.5-2.5 (3) MG/3ML SOLN Take 3 mLs by nebulization every 4 (four) hours as needed (wheezing and shortness of breath).    Yes [provider]  potassium chloride SA (K-DUR,KLOR-CON) 20 MEQ tablet Take 1 tablet (20 mEq total) by mouth daily. 05/07/18  Yes Marykay LexHarding, David W, MD  warfarin (COUMADIN) 2.5 MG tablet TAKE ONE TO TWO TABLETS BY MOUTH AS INSTRUCTED BY COUMADIN CLINIC Patient taking differently: Take 2.5-5 mg by mouth See admin instructions. TAKE ONE TO TWO TABLETS BY MOUTH AS INSTRUCTED BY COUMADIN CLINIC  2.5 mg on Sunday,Tuesday,Thursday. 5mg  on Monday,Wednesday,Friday, and Saturday  Pt takes in the morning, and then again at 14:00 if on a 5mg  dose day. 01/30/18  Yes Marykay LexHarding, David W, MD  amoxicillin (AMOXIL) 500 MG tablet 1 twice daily Patient not taking: Reported on 04/13/2018 12/07/17   Waymon BudgeYoung, Clinton  D, MD  OXYGEN Inhale 3 L into the lungs as needed (shortness of breath).     [provider]  predniSONE (DELTASONE) 20 MG tablet Take 2 tablets (40 mg total) by mouth daily. Patient not taking: Reported on 05/15/2018 04/13/18   Raeford RazorKohut, Stephen, MD    Physical Exam: Vitals:   05/15/18 2230 05/15/18 2300 05/15/18 2330 05/16/18 0012  BP: 113/64 126/64 (!) 135/97 (!) 139/56  Pulse: 98 80 92 92  Resp: 14 20 (!) 29 (!) 22  Temp:    98.3 F (36.8 C)  TempSrc:    Oral  SpO2: 92% 94% (!) 87% 98%      Constitutional: Moderately built and nourished. Vitals:   05/15/18 2230 05/15/18 2300 05/15/18 2330 05/16/18 0012  BP: 113/64 126/64 Marland Kitchen(!)  135/97 (!) 139/56  Pulse: 98 80 92 92  Resp: 14 20 (!) 29 (!) 22  Temp:    98.3 F (36.8 C)  TempSrc:    Oral  SpO2: 92% 94% (!) 87% 98%   Eyes: Anicteric no pallor. ENMT: No discharge from the ears eyes nose or mouth. Neck: No mass felt.  No JVD appreciated. Respiratory: Mild expiratory wheeze heard no crepitations. Cardiovascular: S1-S2 heard. Abdomen: Soft nontender bowel sounds present. Musculoskeletal: No edema.  No joint effusion. Skin: No rash. Neurologic: Alert awake oriented to time place and person.  Moves all extremities. Psychiatric: Appears normal.   Labs on Admission: I have personally reviewed following labs and imaging studies  CBC: Recent Labs  Lab 05/15/18 2055  WBC 14.2*  HGB 13.9  HCT 46.1  MCV 95.2  PLT 241   Basic Metabolic Panel: Recent Labs  Lab 05/15/18 2055  NA 142  K 4.5  CL 94*  CO2 36*  GLUCOSE 132*  BUN 33*  CREATININE 1.32*  CALCIUM 9.0   GFR: CrCl cannot be calculated (Unknown ideal weight.). Liver Function Tests: No results for input(s): AST, ALT, ALKPHOS, BILITOT, PROT, ALBUMIN in the last 168 hours. No results for input(s): LIPASE, AMYLASE in the last 168 hours. No results for input(s): AMMONIA in the last 168 hours. Coagulation Profile: Recent Labs  Lab 05/15/18 2055  INR  2.61   Cardiac Enzymes: No results for input(s): CKTOTAL, CKMB, CKMBINDEX, TROPONINI in the last 168 hours. BNP (last 3 results) No results for input(s): PROBNP in the last 8760 hours. HbA1C: No results for input(s): HGBA1C in the last 72 hours. CBG: No results for input(s): GLUCAP in the last 168 hours. Lipid Profile: No results for input(s): CHOL, HDL, LDLCALC, TRIG, CHOLHDL, LDLDIRECT in the last 72 hours. Thyroid Function Tests: No results for input(s): TSH, T4TOTAL, FREET4, T3FREE, THYROIDAB in the last 72 hours. Anemia Panel: No results for input(s): VITAMINB12, FOLATE, FERRITIN, TIBC, IRON, RETICCTPCT in the last 72 hours. Urine analysis:    Component Value Date/Time   COLORURINE YELLOW 07/05/2017 1040   APPEARANCEUR CLEAR 07/05/2017 1040   LABSPEC 1.010 07/05/2017 1040   PHURINE 6.0 07/05/2017 1040   GLUCOSEU NEGATIVE 07/05/2017 1040   HGBUR NEGATIVE 07/05/2017 1040   BILIRUBINUR NEGATIVE 07/05/2017 1040   KETONESUR NEGATIVE 07/05/2017 1040   PROTEINUR NEGATIVE 07/05/2017 1040   UROBILINOGEN 0.2 10/19/2010 1150   NITRITE NEGATIVE 07/05/2017 1040   LEUKOCYTESUR NEGATIVE 07/05/2017 1040   Sepsis Labs: @LABRCNTIP (procalcitonin:4,lacticidven:4) )No results found for this or any previous visit (from the past 240 hour(s)).   Radiological Exams on Admission: Dg Chest 2 View  Result Date: 05/15/2018 CLINICAL DATA:  Acute onset of shortness of breath and productive cough. EXAM: CHEST - 2 VIEW COMPARISON:  Chest radiograph performed 04/13/2018 FINDINGS: The patient is status post right lower lobe lung resection. A small right pleural effusion is noted. Right basilar airspace opacity may reflect atelectasis or possibly mild pneumonia, given the patient's symptoms. Underlying vascular congestion is noted. No pneumothorax is seen. The cardiomediastinal silhouette is borderline normal. The patient is status post median sternotomy, with evidence of prior CABG. No acute osseous  abnormalities are identified. IMPRESSION: 1. Small right pleural effusion noted. Right basilar airspace opacity may reflect atelectasis or possibly mild pneumonia, given the patient's symptoms. 2. Underlying vascular congestion noted. Electronically Signed   By: Roanna Raider M.D.   On: 05/15/2018 21:35    EKG: Independently reviewed.  A. fib RBBB.  Assessment/Plan Principal Problem:  Acute respiratory failure with hypoxia (HCC) Active Problems:   S/P CABG x 4   Paroxysmal atrial fibrillation (HCC); CHA2DS2-VASc Score = 4. On Warfarin   Essential hypertension   COPD mixed type (HCC)   AAA (abdominal aortic aneurysm) without rupture seen on CT scan   Chronic renal failure syndrome, stage 3 (moderate) (HCC)   Chronic diastolic CHF (congestive heart failure), NYHA class 2 (HCC)   Community acquired pneumonia    1. Acute respiratory failure with hypoxia likely a combination of pneumonia and possible mild fluid overload.  Patient is on ceftriaxone and Zithromax check sputum cultures urine for Legionella strep antigen and for possible fluid overload I have ordered 1 dose of Lasix 40 mg IV.  Patient is also on p.o. Lasix every day and takes her evening Lasix on Monday Wednesday Friday and Saturday.  Closely follow intake output metabolic panel. 2. CAD status post CABG has some upper back pain.  We will cycle cardiac markers.  Patient is on Coumadin statins and aspirin.  Not on beta-blockers due to COPD. 3. Chronic kidney disease stage II-III creatinine appears to be at baseline. 4. Dyslipidemia on statins. 5. Paroxysmal atrial fibrillation on Coumadin.   DVT prophylaxis: Coumadin. Code Status: DNR. Family Communication: Discussed with patient. Disposition Plan: Home. Consults called: None. Admission status: Inpatient.   Roberto Clos MD [facility] Pager [phone].  If 7PM-7AM, please contact night-coverage [email or web address] Password [email or web address]  05/16/2018, 3:52 AM

## 2018-05-17 DIAGNOSIS — J9621 Acute and chronic respiratory failure with hypoxia: Secondary | ICD-10-CM

## 2018-05-17 DIAGNOSIS — N183 Chronic kidney disease, stage 3 (moderate): Secondary | ICD-10-CM

## 2018-05-17 DIAGNOSIS — I10 Essential (primary) hypertension: Secondary | ICD-10-CM

## 2018-05-17 DIAGNOSIS — Z951 Presence of aortocoronary bypass graft: Secondary | ICD-10-CM

## 2018-05-17 DIAGNOSIS — I480 Paroxysmal atrial fibrillation: Secondary | ICD-10-CM

## 2018-05-17 DIAGNOSIS — J441 Chronic obstructive pulmonary disease with (acute) exacerbation: Secondary | ICD-10-CM

## 2018-05-17 LAB — COMPREHENSIVE METABOLIC PANEL
ALT: 16 U/L (ref 0–44)
AST: 20 U/L (ref 15–41)
Albumin: 3.7 g/dL (ref 3.5–5.0)
Alkaline Phosphatase: 62 U/L (ref 38–126)
Anion gap: 11 (ref 5–15)
BUN: 57 mg/dL — ABNORMAL HIGH (ref 8–23)
CO2: 31 mmol/L (ref 22–32)
CREATININE: 1.2 mg/dL (ref 0.61–1.24)
Calcium: 8.6 mg/dL — ABNORMAL LOW (ref 8.9–10.3)
Chloride: 96 mmol/L — ABNORMAL LOW (ref 98–111)
GFR calc non Af Amer: 56 mL/min — ABNORMAL LOW (ref >60)
Glucose, Bld: 146 mg/dL — ABNORMAL HIGH (ref 70–99)
Potassium: 4.9 mmol/L (ref 3.5–5.1)
Sodium: 138 mmol/L (ref 135–145)
Total Bilirubin: 0.5 mg/dL (ref 0.3–1.2)
Total Protein: 6.4 g/dL — ABNORMAL LOW (ref 6.5–8.1)

## 2018-05-17 LAB — CBC WITH DIFFERENTIAL/PLATELET
Abs Immature Granulocytes: 0.13 10*3/uL — ABNORMAL HIGH (ref 0.00–0.07)
BASOS PCT: 0 %
Basophils Absolute: 0 10*3/uL (ref 0.0–0.1)
Eosinophils Absolute: 0 10*3/uL (ref 0.0–0.5)
Eosinophils Relative: 0 %
HCT: 40.7 % (ref 39.0–52.0)
Hemoglobin: 12.5 g/dL — ABNORMAL LOW (ref 13.0–17.0)
Immature Granulocytes: 1 %
Lymphocytes Relative: 6 %
Lymphs Abs: 0.7 10*3/uL (ref 0.7–4.0)
MCH: 28 pg (ref 26.0–34.0)
MCHC: 30.7 g/dL (ref 30.0–36.0)
MCV: 91.1 fL (ref 80.0–100.0)
Monocytes Absolute: 0.4 10*3/uL (ref 0.1–1.0)
Monocytes Relative: 3 %
NEUTROS PCT: 90 %
NRBC: 0 % (ref 0.0–0.2)
Neutro Abs: 10.7 10*3/uL — ABNORMAL HIGH (ref 1.7–7.7)
Platelets: 223 10*3/uL (ref 150–400)
RBC: 4.47 MIL/uL (ref 4.22–5.81)
RDW: 13.7 % (ref 11.5–15.5)
WBC: 11.9 10*3/uL — ABNORMAL HIGH (ref 4.0–10.5)

## 2018-05-17 LAB — LEGIONELLA PNEUMOPHILA SEROGP 1 UR AG: L. pneumophila Serogp 1 Ur Ag: NEGATIVE

## 2018-05-17 LAB — PROTIME-INR
INR: 3.04
Prothrombin Time: 31 seconds — ABNORMAL HIGH (ref 11.4–15.2)

## 2018-05-17 LAB — MAGNESIUM: Magnesium: 2.9 mg/dL — ABNORMAL HIGH (ref 1.7–2.4)

## 2018-05-17 MED ORDER — WARFARIN SODIUM 1 MG PO TABS
1.0000 mg | ORAL_TABLET | Freq: Once | ORAL | Status: AC
  Administered 2018-05-17: 1 mg via ORAL
  Filled 2018-05-17: qty 1

## 2018-05-17 NOTE — Progress Notes (Addendum)
Patient ID: Roberto Reilly, male   DOB: [birth year], 84 y.o.   MRN: [id number]  PROGRESS NOTE    Roberto Reilly  WUJ:[id number] DOB: 06-04-1933 DOA: 05/15/2018 PCP: Pearson Grippe, MD   Brief Narrative:  84 year old male with history of CAD status post CABG, diastolic CHF with last EF of 60 to 65% in August 2018, chronic kidney disease stage II with baseline creatinine around 1.3, atrial fibrillation, COPD on home oxygen presented on 05/15/2018 with worsening shortness of breath and cough.  Chest x-ray in the ED was concerning for pneumonia.  There was also concern for fluid overload.  Patient was given intravenous Lasix and intravenous antibiotics.   Assessment & Plan:   Principal Problem:   Acute respiratory failure with hypoxia (HCC) Active Problems:   S/P CABG x 4   Paroxysmal atrial fibrillation (HCC); CHA2DS2-VASc Score = 4. On Warfarin   Essential hypertension   COPD mixed type (HCC)   AAA (abdominal aortic aneurysm) without rupture seen on CT scan   Chronic renal failure syndrome, stage 3 (moderate) (HCC)   Chronic diastolic CHF (congestive heart failure), NYHA class 2 (HCC)   Community acquired pneumonia   Acute on chronic hypoxic respiratory failure -Probably secondary to combination of pneumonia/COPD exacerbation/fluid overload -Currently on 5 L oxygen via nasal cannula.  Normally wears 4 L oxygen by nasal cannula at home  Probable community-acquired bacterial right lower lobar pneumonia -Currently on Rocephin and Zithromax which will be continued.  Influenza a and B negative.  Strep pneumo urinary antigen negative.  COPD exacerbation -Still has some wheezing.  Continue Solu-Medrol 40 mg IV every 12 hours for today.  Continue Dulera and neb treatments.  Chronic diastolic CHF -Compensated.  Continue daily Lasix for now.  Strict input and output.  Daily weights.  Monitor creatinine  Chronic kidney disease stage II-III -Creatinine stable.  Monitor  Coronary artery disease  status post CABG -No current chest pain.  Troponins negative.  Continue aspirin and statin.  Outpatient follow-up  Paroxysmal atrial fibrillation on Coumadin -Continue Coumadin.  INR is 3.04 today, slightly supratherapeutic.  Monitor INR and dose Coumadin accordingly as per pharmacy.  Dyslipidemia  -continue statins   DVT prophylaxis: Coumadin Code Status: DNR Family Communication: None at bedside Disposition Plan: Home in 1 to 2 days if respiratory status improves  Consultants: None  Procedures: None  Antimicrobials: Rocephin and Zithromax from 05/15/2018 onwards   Subjective: Patient seen and examined at bedside.  He feels slightly better but not better enough to go home.  He still short of breath and intermittently coughing and does not feel back to his baseline yet.  No overnight fever, nausea or vomiting. Objective: Vitals:   05/16/18 2045 05/17/18 0411 05/17/18 0500 05/17/18 0917  BP:   112/75   Pulse:   63   Resp:   18   Temp:   98.7 F (37.1 C)   TempSrc:   Oral   SpO2: 97% 96% 92% 96%  Weight:   92 kg   Height:        Intake/Output Summary (Last 24 hours) at 05/17/2018 1102 Last data filed at 05/17/2018 1044 Gross per 24 hour  Intake 1190 ml  Output 1425 ml  Net -235 ml   Filed Weights   05/16/18 1119 05/17/18 0500  Weight: 90.2 kg 92 kg    Examination:  General exam: Appears calm and comfortable, no distress Respiratory system: Bilateral decreased breath sounds at bases with some scattered wheezing and some crackles at bases Cardiovascular  system: S1 & S2 heard, Rate controlled Gastrointestinal system: Abdomen is nondistended, soft and nontender. Normal bowel sounds heard. Extremities: No cyanosis, clubbing; trace edema   Data Reviewed: I have personally reviewed following labs and imaging studies  CBC: Recent Labs  Lab 05/15/18 2055 05/16/18 0425 05/17/18 0412  WBC 14.2* 12.0* 11.9*  NEUTROABS  --  11.1* 10.7*  HGB 13.9 12.4* 12.5*  HCT  46.1 41.4 40.7  MCV 95.2 95.4 91.1  PLT 241 192 223   Basic Metabolic Panel: Recent Labs  Lab 05/15/18 2055 05/16/18 0425 05/17/18 0412  NA 142 141 138  K 4.5 5.3* 4.9  CL 94* 96* 96*  CO2 36* 34* 31  GLUCOSE 132* 170* 146*  BUN 33* 35* 57*  CREATININE 1.32* 1.39* 1.20  CALCIUM 9.0 8.5* 8.6*  MG  --  2.6* 2.9*   GFR: Estimated Creatinine Clearance: 52.1 mL/min (by C-G formula based on SCr of 1.2 mg/dL). Liver Function Tests: Recent Labs  Lab 05/16/18 0425 05/17/18 0412  AST 24 20  ALT 18 16  ALKPHOS 61 62  BILITOT 0.6 0.5  PROT 6.5 6.4*  ALBUMIN 3.5 3.7   No results for input(s): LIPASE, AMYLASE in the last 168 hours. No results for input(s): AMMONIA in the last 168 hours. Coagulation Profile: Recent Labs  Lab 05/15/18 2055 05/16/18 0345 05/17/18 0412  INR 2.61 2.38 3.04   Cardiac Enzymes: Recent Labs  Lab 05/16/18 0425 05/16/18 1101 05/16/18 1534  TROPONINI <0.03 <0.03 <0.03   BNP (last 3 results) No results for input(s): PROBNP in the last 8760 hours. HbA1C: No results for input(s): HGBA1C in the last 72 hours. CBG: No results for input(s): GLUCAP in the last 168 hours. Lipid Profile: No results for input(s): CHOL, HDL, LDLCALC, TRIG, CHOLHDL, LDLDIRECT in the last 72 hours. Thyroid Function Tests: No results for input(s): TSH, T4TOTAL, FREET4, T3FREE, THYROIDAB in the last 72 hours. Anemia Panel: No results for input(s): VITAMINB12, FOLATE, FERRITIN, TIBC, IRON, RETICCTPCT in the last 72 hours. Sepsis Labs: Recent Labs  Lab 05/15/18 2123  LATICACIDVEN 1.53    Recent Results (from the past 240 hour(s))  Culture, sputum-assessment     Status: None   Collection Time: 05/16/18  3:53 AM  Result Value Ref Range Status   Specimen Description SPUTUM  Final   Special Requests NONE  Final   Sputum evaluation   Final    THIS SPECIMEN IS ACCEPTABLE FOR SPUTUM CULTURE Performed at [facility], [address] [place],  [place] [address]    Report Status 05/16/2018 FINAL  Final  Culture, respiratory     Status: None (Preliminary result)   Collection Time: 05/16/18  3:53 AM  Result Value Ref Range Status   Specimen Description   Final    SPUTUM Performed at [facility], [address] [place], [place] [address]    Special Requests   Final    NONE Reflexed from [id number] Performed at [facility], [address] [place], [place] [address]    Gram Stain NO WBC SEEN RARE GRAM POSITIVE COCCI IN PAIRS   Final   Culture   Final    CULTURE REINCUBATED FOR BETTER GROWTH Performed at [facility], [address] [place], [place] [address]    Report Status PENDING  Incomplete         Radiology Studies: Dg Chest 2 View  Result Date: 05/15/2018 CLINICAL DATA:  Acute onset of shortness of breath and  productive cough. EXAM: CHEST - 2 VIEW COMPARISON:  Chest radiograph performed 04/13/2018 FINDINGS: The patient is status post right lower lobe lung resection. A small right pleural effusion is noted. Right basilar airspace opacity may reflect atelectasis or possibly mild pneumonia, given the patient's symptoms. Underlying vascular congestion is noted. No pneumothorax is seen. The cardiomediastinal silhouette is borderline normal. The patient is status post median sternotomy, with evidence of prior CABG. No acute osseous abnormalities are identified. IMPRESSION: 1. Small right pleural effusion noted. Right basilar airspace opacity may reflect atelectasis or possibly mild pneumonia, given the patient's symptoms. 2. Underlying vascular congestion noted. Electronically Signed   By: Roanna RaiderJeffery  Chang M.D.   On: 05/15/2018 21:35        Scheduled Meds: . amLODipine  10 mg Oral Daily  . aspirin EC  81 mg Oral Daily  . atorvastatin  40 mg Oral q1800  . furosemide  40 mg Oral Daily  . ipratropium-albuterol  3 mL Nebulization Q6H  . mouth rinse  15 mL Mouth Rinse BID  .  methylPREDNISolone (SOLU-MEDROL) injection  40 mg Intravenous Q12H  . mometasone-formoterol  2 puff Inhalation BID  . potassium chloride SA  20 mEq Oral Daily  . warfarin  1 mg Oral ONCE-1800  . Warfarin - Pharmacist Dosing Inpatient   Does not apply q1800   Continuous Infusions: . azithromycin Stopped (05/16/18 1927)  . cefTRIAXone (ROCEPHIN)  IV Stopped (05/16/18 1745)     LOS: 2 days        Glade LloydKshitiz Alesha Jaffee, MD [facility] Pager [phone]  If 7PM-7AM, please contact night-coverage [email or web address] Password [email or web address] 05/17/2018, 11:02 AM

## 2018-05-17 NOTE — Progress Notes (Signed)
ANTICOAGULATION CONSULT NOTE - Follow up  Pharmacy Consult for Warfarin Indication: atrial fibrillation  No Known Allergies   Height: 6' (182.9 cm) Weight: 202 lb 14.4 oz (92 kg)(standing scale ) IBW/kg (Calculated) : 77.6   Vital Signs: Temp: 98.7 F (37.1 C) (12/26 0500) Temp Source: Oral (12/26 0500) BP: 112/75 (12/26 0500) Pulse Rate: 63 (12/26 0500)  Labs: Recent Labs    05/15/18 2055 05/16/18 0345 05/16/18 0425 05/16/18 1101 05/16/18 1534 05/17/18 0412  HGB 13.9  --  12.4*  --   --  12.5*  HCT 46.1  --  41.4  --   --  40.7  PLT 241  --  192  --   --  223  LABPROT 27.6* 25.7*  --   --   --  31.0*  INR 2.61 2.38  --   --   --  3.04  CREATININE 1.32*  --  1.39*  --   --  1.20  TROPONINI  --   --  <0.03 <0.03 <0.03  --     Estimated Creatinine Clearance: 52.1 mL/min (by C-G formula based on SCr of 1.2 mg/dL).   Medical History: Past Medical History:  Diagnosis Date  . AAA (abdominal aortic aneurysm) (HCC)    4.2 X 4.8 cm 04/2013 CT; declined re-eval 02/25/15  . Anxiety   . CAD in native artery March 2007   Cath for dyspnea on exertion: 75% distal LM, 85% RI, 95% mid-distal Cx, in multiple RCA lesions with 95% distal. --> Referred for CABG; has declined further noninvasive evaluation in the absence of worsening symptoms  . COPD (chronic obstructive pulmonary disease) with emphysema (HCC) 05/23/2007   Hosp 5/29-6/01/12- COPD exacerbation ONOX 12/08/10- desaturated to less than 88% for over an hour, qualifying for home O2 during sleep    . Diverticulosis of colon with hemorrhage 05/24/2013  . Dyslipidemia, goal LDL below 70   . History of non-ST Elevation MI (myocardial infarction) March 2007   Admitted with COPD exacerbation, given by CHF. Had some chest pain with positive troponins. Cath showed multivessel disease, referred for CABG  . History of pneumonia   . Hypertension   . Hypoxia     chronic, on home O2  . Obesity (BMI 30-39.9)    One year ago weighed 265  pounds, (12/2012) -- now 234 pounds  . PAF (paroxysmal atrial fibrillation) (HCC)    Anticoagulated on warfarin. Stable -- post op  . Pneumonia    hx  . 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 Seasonal allergies   . Shortness of breath dyspnea     Medications:  Scheduled:  . amLODipine  10 mg Oral Daily  . aspirin EC  81 mg Oral Daily  . atorvastatin  40 mg Oral q1800  . furosemide  40 mg Oral Daily  . ipratropium-albuterol  3 mL Nebulization Q6H  . mouth rinse  15 mL Mouth Rinse BID  . methylPREDNISolone (SOLU-MEDROL) injection  40 mg Intravenous Q12H  . mometasone-formoterol  2 puff Inhalation BID  . potassium chloride SA  20 mEq Oral Daily  . Warfarin - Pharmacist Dosing Inpatient   Does not apply q1800   Infusions:  . azithromycin Stopped (05/16/18 1927)  . cefTRIAXone (ROCEPHIN)  IV Stopped (05/16/18 1745)    Assessment: [age] yoM with hx of AAA, CAD, COPD on chronic warfarin for A-fib now with SOB. HD 2.5 mg Su/Tu/Th and 5 mg M/W/F/S.  LD 12/24 INR=2.61  on admission.    05/17/2018  INR 3.04 just above therapeutic range, received doses of 5mg  warfarin so far as inpatient  Hgb and Plts stable  No reported bleeding  Goal of Therapy:  INR 2-3   Plan:  1) Only warfarin 1mg  this PM 2) Daily INR   Hessie KnowsJustin M Hafsa Lohn, PharmD, BCPS Pager [phone] 05/17/2018 9:01 AM

## 2018-05-17 NOTE — Evaluation (Signed)
Physical Therapy Evaluation Patient Details Name: Roberto Reilly MRN: [id number] DOB: 06/21/1933 Today's Date: 05/17/2018   History of Present Illness  84 year old male with history of CAD status post CABG, diastolic CHF with last EF of 60 to 65% in August 2018, chronic kidney disease stage II with baseline creatinine around 1.3, atrial fibrillation, COPD on home oxygen presented on 05/15/2018 with worsening shortness of breath and cough.  Chest x-ray in the ED was concerning for pneumonia.  Clinical Impression  Pt admitted with above diagnosis. Pt currently with functional limitations due to the deficits listed below (see PT Problem List). SaO2 dropped to 85% on 6L O2 while ambulation 60' with RW, SaO2 94% on 5L O2 at rest. Ambulation distance limited by therapist 2* hypoxia.  Pt will benefit from skilled PT to increase their independence and safety with mobility to allow discharge to the venue listed below.       Follow Up Recommendations Home health PT    Equipment Recommendations  None recommended by PT    Recommendations for Other Services       Precautions / Restrictions Precautions Precautions: Other (comment) Precaution Comments: monitor O2, on 4L O2 at home Restrictions Weight Bearing Restrictions: No      Mobility  Bed Mobility               General bed mobility comments: NT- up in recliner  Transfers Overall transfer level: Modified independent Equipment used: None                Ambulation/Gait Ambulation/Gait assistance: Min guard Gait Distance (Feet): 60 Feet Assistive device: Rolling walker (2 wheeled) Gait Pattern/deviations: Step-through pattern;Decreased stride length Gait velocity: wfl   General Gait Details: SaO2 85% on 6L O2 with walking so returned to room to rest, no loss of balance, 3/4 dyspnea  Stairs            Wheelchair Mobility    Modified Rankin (Stroke Patients Only)       Balance Overall balance assessment:  Modified Independent                                           Pertinent Vitals/Pain Pain Assessment: No/denies pain    Home Living Family/patient expects to be discharged to:: Private residence Living Arrangements: Alone Available Help at Discharge: Family;Available PRN/intermittently Type of Home: Apartment Home Access: Level entry     Home Layout: One level Home Equipment: Cane - single point;Walker - 2 wheels Additional Comments: daughter comes on Saturdays to assist with groceries, etc.    Prior Function Level of Independence: Independent with assistive device(s)         Comments: uses cane PRN     Hand Dominance        Extremity/Trunk Assessment   Upper Extremity Assessment Upper Extremity Assessment: Overall WFL for tasks assessed    Lower Extremity Assessment Lower Extremity Assessment: Overall WFL for tasks assessed    Cervical / Trunk Assessment Cervical / Trunk Assessment: Normal  Communication   Communication: HOH  Cognition Arousal/Alertness: Awake/alert Behavior During Therapy: WFL for tasks assessed/performed Overall Cognitive Status: Within Functional Limits for tasks assessed                                        General Comments  Exercises     Assessment/Plan    PT Assessment Patient needs continued PT services  PT Problem List Cardiopulmonary status limiting activity;Decreased activity tolerance       PT Treatment Interventions Functional mobility training;Patient/family education;Therapeutic exercise;Gait training    PT Goals (Current goals can be found in the Care Plan section)  Acute Rehab PT Goals Patient Stated Goal: walk farther PT Goal Formulation: With patient Time For Goal Achievement: 05/31/18 Potential to Achieve Goals: Fair    Frequency Min 3X/week   Barriers to discharge        Co-evaluation               AM-PAC PT "6 Clicks" Mobility  Outcome Measure Help  needed turning from your back to your side while in a flat bed without using bedrails?: A Little Help needed moving from lying on your back to sitting on the side of a flat bed without using bedrails?: A Little Help needed moving to and from a bed to a chair (including a wheelchair)?: A Little Help needed standing up from a chair using your arms (e.g., wheelchair or bedside chair)?: None Help needed to walk in hospital room?: A Little Help needed climbing 3-5 steps with a railing? : A Little 6 Click Score: 19    End of Session Equipment Utilized During Treatment: Oxygen;Gait belt Activity Tolerance: Treatment limited secondary to medical complications (Comment)(hypoxia with ambulation) Patient left: in chair;with call bell/phone within reach;with chair alarm set Nurse Communication: Mobility status;Other (comment)(hypoxia with ambulation) PT Visit Diagnosis: Difficulty in walking, not elsewhere classified (R26.2)    Time: [id number] PT Time Calculation (min) (ACUTE ONLY): 13 min   Charges:   PT Evaluation $PT Eval Low Complexity: 1 Low          Ralene BatheUhlenberg, Roberto Reilly PT 05/17/2018  Acute Rehabilitation Services Pager [phone] Office [phone]

## 2018-05-18 DIAGNOSIS — I5033 Acute on chronic diastolic (congestive) heart failure: Secondary | ICD-10-CM

## 2018-05-18 LAB — CBC WITH DIFFERENTIAL/PLATELET
Abs Immature Granulocytes: 0.22 10*3/uL — ABNORMAL HIGH (ref 0.00–0.07)
Basophils Absolute: 0 10*3/uL (ref 0.0–0.1)
Basophils Relative: 0 %
EOS ABS: 0 10*3/uL (ref 0.0–0.5)
Eosinophils Relative: 0 %
HCT: 39.6 % (ref 39.0–52.0)
Hemoglobin: 12.2 g/dL — ABNORMAL LOW (ref 13.0–17.0)
Immature Granulocytes: 2 %
Lymphocytes Relative: 6 %
Lymphs Abs: 0.7 10*3/uL (ref 0.7–4.0)
MCH: 28.2 pg (ref 26.0–34.0)
MCHC: 30.8 g/dL (ref 30.0–36.0)
MCV: 91.7 fL (ref 80.0–100.0)
Monocytes Absolute: 0.4 10*3/uL (ref 0.1–1.0)
Monocytes Relative: 3 %
Neutro Abs: 10 10*3/uL — ABNORMAL HIGH (ref 1.7–7.7)
Neutrophils Relative %: 89 %
Platelets: 212 10*3/uL (ref 150–400)
RBC: 4.32 MIL/uL (ref 4.22–5.81)
RDW: 13.6 % (ref 11.5–15.5)
WBC: 11.3 10*3/uL — ABNORMAL HIGH (ref 4.0–10.5)
nRBC: 0 % (ref 0.0–0.2)

## 2018-05-18 LAB — MAGNESIUM: Magnesium: 2.6 mg/dL — ABNORMAL HIGH (ref 1.7–2.4)

## 2018-05-18 LAB — BASIC METABOLIC PANEL
Anion gap: 11 (ref 5–15)
BUN: 49 mg/dL — ABNORMAL HIGH (ref 8–23)
CO2: 30 mmol/L (ref 22–32)
CREATININE: 1.15 mg/dL (ref 0.61–1.24)
Calcium: 8.6 mg/dL — ABNORMAL LOW (ref 8.9–10.3)
Chloride: 97 mmol/L — ABNORMAL LOW (ref 98–111)
GFR calc Af Amer: >60 mL/min (ref >60)
GFR calc non Af Amer: 59 mL/min — ABNORMAL LOW (ref >60)
GLUCOSE: 138 mg/dL — AB (ref 70–99)
Potassium: 4.8 mmol/L (ref 3.5–5.1)
Sodium: 138 mmol/L (ref 135–145)

## 2018-05-18 LAB — PROTIME-INR
INR: 2.82
Prothrombin Time: 29.3 seconds — ABNORMAL HIGH (ref 11.4–15.2)

## 2018-05-18 LAB — CULTURE, RESPIRATORY W GRAM STAIN
Culture: NORMAL
Gram Stain: NONE SEEN

## 2018-05-18 MED ORDER — METHYLPREDNISOLONE SODIUM SUCC 40 MG IJ SOLR
40.0000 mg | Freq: Every day | INTRAMUSCULAR | Status: DC
  Administered 2018-05-19: 40 mg via INTRAVENOUS
  Filled 2018-05-18: qty 1

## 2018-05-18 MED ORDER — FUROSEMIDE 10 MG/ML IJ SOLN
40.0000 mg | Freq: Two times a day (BID) | INTRAMUSCULAR | Status: DC
  Administered 2018-05-18 – 2018-05-19 (×3): 40 mg via INTRAVENOUS
  Filled 2018-05-18 (×3): qty 4

## 2018-05-18 MED ORDER — WARFARIN SODIUM 2.5 MG PO TABS
2.5000 mg | ORAL_TABLET | Freq: Once | ORAL | Status: AC
  Administered 2018-05-18: 2.5 mg via ORAL
  Filled 2018-05-18: qty 1

## 2018-05-18 NOTE — Progress Notes (Signed)
Patient ID: Roberto Reilly, male   DOB: 1934/03/17, 84 y.o.   MRN: [id number]  PROGRESS NOTE    Greene Diodato  NWG:[id number] DOB: March 04, 1934 DOA: 05/15/2018 PCP: Pearson Grippe, MD   Brief Narrative:  84 year old male with history of CAD status post CABG, diastolic CHF with last EF of 60 to 65% in August 2018, chronic kidney disease stage II with baseline creatinine around 1.3, atrial fibrillation, COPD on home oxygen presented on 05/15/2018 with worsening shortness of breath and cough.  Chest x-ray in the ED was concerning for pneumonia.  There was also concern for fluid overload.  Patient was given intravenous Lasix and intravenous antibiotics.   Assessment & Plan:   Principal Problem:   Acute respiratory failure with hypoxia (HCC) Active Problems:   S/P CABG x 4   Paroxysmal atrial fibrillation (HCC); CHA2DS2-VASc Score = 4. On Warfarin   Essential hypertension   COPD mixed type (HCC)   AAA (abdominal aortic aneurysm) without rupture seen on CT scan   Chronic renal failure syndrome, stage 3 (moderate) (HCC)   Chronic diastolic CHF (congestive heart failure), NYHA class 2 (HCC)   Community acquired pneumonia   Acute on chronic hypoxic respiratory failure -Probably secondary to combination of pneumonia/COPD exacerbation/fluid overload -Improving.  Currently on 4 L nasal cannula oxygen which is his home oxygen requirement.  Probable community-acquired bacterial right lower lobar pneumonia -Currently on Rocephin and Zithromax which will be continued.  Influenza a and B negative.  Strep pneumo urinary antigen negative.  COPD exacerbation -Improving.  Decrease Solu-Medrol to 40 mg IV daily.  Probable switch to prednisone tomorrow.  Continue Dulera and neb treatments.  Acute on chronic diastolic CHF -Patient is a little more edematous.  We will switch Lasix to 40 mg IV every 12 hours for today.  Strict input and output.  Daily weights.  Monitor creatinine  Chronic kidney disease stage  II-III -Creatinine stable.  Monitor  Coronary artery disease status post CABG -No current chest pain.  Troponins negative.  Continue aspirin and statin.  Outpatient follow-up  Paroxysmal atrial fibrillation on Coumadin -Continue Coumadin.  INR therapeutic today.  Monitor INR and dose Coumadin accordingly as per pharmacy.  Dyslipidemia  -continue statins  Generalized deconditioning -PT recommends home health PT.   DVT prophylaxis: Coumadin Code Status: DNR Family Communication: None at bedside Disposition Plan: Home in 1 to 2 days if respiratory status improves and patient is clinically better  Consultants: None  Procedures: None  Antimicrobials: Rocephin and Zithromax from 05/15/2018 onwards   Subjective: Patient seen and examined at bedside.  He feels slightly better than yesterday but not ready for discharge yet.  He is still coughing and extremely short of breath with even minimal exertion.  No chest pains overnight.  No overnight fever or vomiting.    Objective: Vitals:   05/18/18 0412 05/18/18 0517 05/18/18 1004 05/18/18 1008  BP:  132/79    Pulse:  72    Resp:  20    Temp:  98.8 F (37.1 C)    TempSrc:  Axillary    SpO2: 95% 94% 94% 94%  Weight:  92.5 kg    Height:        Intake/Output Summary (Last 24 hours) at 05/18/2018 1042 Last data filed at 05/18/2018 0937 Gross per 24 hour  Intake 1333.4 ml  Output 1875 ml  Net -541.6 ml   Filed Weights   05/16/18 1119 05/17/18 0500 05/18/18 0517  Weight: 90.2 kg 92 kg 92.5 kg  Examination:  General exam: Appears calm and comfortable, no acute distress Respiratory system: Bilateral decreased breath sounds at bases with basilar crackles.  No wheezing  cardiovascular system: Rate controlled, S1-S2 heard Gastrointestinal system: Abdomen is nondistended, soft and nontender. Normal bowel sounds heard. Extremities: No cyanosis; 1+ edema   Data Reviewed: I have personally reviewed following labs and imaging  studies  CBC: Recent Labs  Lab 05/15/18 2055 05/16/18 0425 05/17/18 0412 05/18/18 0420  WBC 14.2* 12.0* 11.9* 11.3*  NEUTROABS  --  11.1* 10.7* 10.0*  HGB 13.9 12.4* 12.5* 12.2*  HCT 46.1 41.4 40.7 39.6  MCV 95.2 95.4 91.1 91.7  PLT 241 192 223 212   Basic Metabolic Panel: Recent Labs  Lab 05/15/18 2055 05/16/18 0425 05/17/18 0412 05/18/18 0420  NA 142 141 138 138  K 4.5 5.3* 4.9 4.8  CL 94* 96* 96* 97*  CO2 36* 34* 31 30  GLUCOSE 132* 170* 146* 138*  BUN 33* 35* 57* 49*  CREATININE 1.32* 1.39* 1.20 1.15  CALCIUM 9.0 8.5* 8.6* 8.6*  MG  --  2.6* 2.9* 2.6*   GFR: Estimated Creatinine Clearance: 54.4 mL/min (by C-G formula based on SCr of 1.15 mg/dL). Liver Function Tests: Recent Labs  Lab 05/16/18 0425 05/17/18 0412  AST 24 20  ALT 18 16  ALKPHOS 61 62  BILITOT 0.6 0.5  PROT 6.5 6.4*  ALBUMIN 3.5 3.7   No results for input(s): LIPASE, AMYLASE in the last 168 hours. No results for input(s): AMMONIA in the last 168 hours. Coagulation Profile: Recent Labs  Lab 05/15/18 2055 05/16/18 0345 05/17/18 0412 05/18/18 0420  INR 2.61 2.38 3.04 2.82   Cardiac Enzymes: Recent Labs  Lab 05/16/18 0425 05/16/18 1101 05/16/18 1534  TROPONINI <0.03 <0.03 <0.03   BNP (last 3 results) No results for input(s): PROBNP in the last 8760 hours. HbA1C: No results for input(s): HGBA1C in the last 72 hours. CBG: No results for input(s): GLUCAP in the last 168 hours. Lipid Profile: No results for input(s): CHOL, HDL, LDLCALC, TRIG, CHOLHDL, LDLDIRECT in the last 72 hours. Thyroid Function Tests: No results for input(s): TSH, T4TOTAL, FREET4, T3FREE, THYROIDAB in the last 72 hours. Anemia Panel: No results for input(s): VITAMINB12, FOLATE, FERRITIN, TIBC, IRON, RETICCTPCT in the last 72 hours. Sepsis Labs: Recent Labs  Lab 05/15/18 2123  LATICACIDVEN 1.53    Recent Results (from the past 240 hour(s))  Culture, sputum-assessment     Status: None   Collection  Time: 05/16/18  3:53 AM  Result Value Ref Range Status   Specimen Description SPUTUM  Final   Special Requests NONE  Final   Sputum evaluation   Final    THIS SPECIMEN IS ACCEPTABLE FOR SPUTUM CULTURE Performed at [facility], [address] [place], [place] [address]    Report Status 05/16/2018 FINAL  Final  Culture, respiratory     Status: None   Collection Time: 05/16/18  3:53 AM  Result Value Ref Range Status   Specimen Description   Final    SPUTUM Performed at [facility], [address] [place], [place] [address]    Special Requests   Final    NONE Reflexed from [id number] Performed at [facility], [address] [place], [place] [address]    Gram Stain NO WBC SEEN RARE GRAM POSITIVE COCCI IN PAIRS   Final   Culture   Final    Consistent with normal respiratory flora. Performed at [facility], [address] [place], [place] [address]    Report Status 05/18/2018 FINAL  Final         Radiology Studies: No results found.      Scheduled Meds: . amLODipine  10 mg Oral Daily  . aspirin EC  81 mg Oral Daily  . atorvastatin  40 mg Oral q1800  . furosemide  40 mg Oral Daily  . ipratropium-albuterol  3 mL Nebulization Q6H  . mouth rinse  15 mL Mouth Rinse BID  . methylPREDNISolone (SOLU-MEDROL) injection  40 mg Intravenous Q12H  . mometasone-formoterol  2 puff Inhalation BID  . potassium chloride SA  20 mEq Oral Daily  . warfarin  2.5 mg Oral ONCE-1800  . Warfarin - Pharmacist Dosing Inpatient   Does not apply q1800   Continuous Infusions: . azithromycin 500 mg (05/17/18 1739)  . cefTRIAXone (ROCEPHIN)  IV 1 g (05/17/18 1838)     LOS: 3 days        Glade LloydKshitiz Zabian Swayne, MD [facility] Pager [phone]  If 7PM-7AM, please contact night-coverage [email or web address] Password [email or web address] 05/18/2018, 10:42 AM

## 2018-05-18 NOTE — Care Management Note (Signed)
Case Management Note  Patient Details  Name: Karna ChristmasJames Drabik MRN: [id number] Date of Birth: 10/24/1933  Subjective/Objective:                    Action/Plan: Pt would not talk to this CM at all concerning HH.    Expected Discharge Date:  05/18/18               Expected Discharge Plan:  Home/Self Care  In-House Referral:     Discharge planning Services  CM Consult  Post Acute Care Choice:    Choice offered to:  Patient  DME Arranged:    DME Agency:     HH Arranged:  Patient Refused HH HH Agency:     Status of Service:  Completed, signed off  If discussed at [employer] Length of Stay Meetings, dates discussed:    Additional CommentsGeni Bers:  Taeja Debellis, RN 05/18/2018, 2:32 PM

## 2018-05-18 NOTE — Progress Notes (Signed)
PT Cancellation Note  Patient Details Name: Roberto Reilly MRN: [id number] DOB: 07/08/1933   Cancelled Treatment:    Reason Eval/Treat Not Completed: Patient declined, no reason specified Pt politely declined to participate.  Pt states he has been getting up to bathroom and now back on his baseline 4L O2 Roberto Reilly.  Pt anticipates d/c tomorrow or Sunday.     Roberto Reilly,Roberto Reilly 05/18/2018, 12:32 PM Zenovia JarredKati Flossie Wexler, PT, DPT Acute Rehabilitation Services Office: [phone] Pager: [phone]

## 2018-05-18 NOTE — Care Management Important Message (Signed)
Important Message  Patient Details  Name: Roberto Reilly MRN: [id number] Date of Birth: 05/17/1934   Medicare Important Message Given:  Yes    [name] 05/18/2018, 8:59 AM

## 2018-05-18 NOTE — Progress Notes (Signed)
ANTICOAGULATION CONSULT NOTE - Follow up  Pharmacy Consult for Warfarin Indication: atrial fibrillation  No Known Allergies   Height: 6' (182.9 cm) Weight: 204 lb (92.5 kg) IBW/kg (Calculated) : 77.6   Vital Signs: Temp: 98.8 F (37.1 C) (12/27 0517) Temp Source: Axillary (12/27 0517) BP: 132/79 (12/27 0517) Pulse Rate: 72 (12/27 0517)  Labs: Recent Labs    05/16/18 0345 05/16/18 0425 05/16/18 1101 05/16/18 1534 05/17/18 0412 05/18/18 0420  HGB  --  12.4*  --   --  12.5* 12.2*  HCT  --  41.4  --   --  40.7 39.6  PLT  --  192  --   --  223 212  LABPROT 25.7*  --   --   --  31.0* 29.3*  INR 2.38  --   --   --  3.04 2.82  CREATININE  --  1.39*  --   --  1.20 1.15  TROPONINI  --  <0.03 <0.03 <0.03  --   --     Estimated Creatinine Clearance: 54.4 mL/min (by C-G formula based on SCr of 1.15 mg/dL).   Medical History: Past Medical History:  Diagnosis Date  . AAA (abdominal aortic aneurysm) (HCC)    4.2 X 4.8 cm 04/2013 CT; declined re-eval 02/25/15  . Anxiety   . CAD in native artery March 2007   Cath for dyspnea on exertion: 75% distal LM, 85% RI, 95% mid-distal Cx, in multiple RCA lesions with 95% distal. --> Referred for CABG; has declined further noninvasive evaluation in the absence of worsening symptoms  . COPD (chronic obstructive pulmonary disease) with emphysema (HCC) 05/23/2007   Hosp 5/29-6/01/12- COPD exacerbation ONOX 12/08/10- desaturated to less than 88% for over an hour, qualifying for home O2 during sleep    . Diverticulosis of colon with hemorrhage 05/24/2013  . Dyslipidemia, goal LDL below 70   . History of non-ST Elevation MI (myocardial infarction) March 2007   Admitted with COPD exacerbation, given by CHF. Had some chest pain with positive troponins. Cath showed multivessel disease, referred for CABG  . History of pneumonia   . Hypertension   . Hypoxia     chronic, on home O2  . Obesity (BMI 30-39.9)    One year ago weighed 265 pounds, (12/2012)  -- now 234 pounds  . PAF (paroxysmal atrial fibrillation) (HCC)    Anticoagulated on warfarin. Stable -- post op  . Pneumonia    hx  . 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 Seasonal allergies   . Shortness of breath dyspnea     Medications:  Scheduled:  . amLODipine  10 mg Oral Daily  . aspirin EC  81 mg Oral Daily  . atorvastatin  40 mg Oral q1800  . furosemide  40 mg Oral Daily  . ipratropium-albuterol  3 mL Nebulization Q6H  . mouth rinse  15 mL Mouth Rinse BID  . methylPREDNISolone (SOLU-MEDROL) injection  40 mg Intravenous Q12H  . mometasone-formoterol  2 puff Inhalation BID  . potassium chloride SA  20 mEq Oral Daily  . Warfarin - Pharmacist Dosing Inpatient   Does not apply q1800   Infusions:  . azithromycin 500 mg (05/17/18 1739)  . cefTRIAXone (ROCEPHIN)  IV 1 g (05/17/18 1838)    Assessment: [age] yoM with hx of AAA, CAD, COPD on chronic warfarin for A-fib now with SOB. HD 2.5 mg Su/Tu/Th and 5 mg M/W/F/S.  LD 12/24 INR=2.61 on admission.  05/18/2018  INR 2.82 therapeutic after doses of 5mg  and 1mg  as inpatient  Hgb and Plts stable  No reported bleeding  Eating well  Goal of Therapy:  INR 2-3   Plan:  1) Warfarin 2.5mg  as per home regimen above 2) Daily INR   Hessie KnowsJustin M Okla Qazi, PharmD, BCPS Pager [phone] 05/18/2018 9:20 AM

## 2018-05-19 ENCOUNTER — Inpatient Hospital Stay (HOSPITAL_COMMUNITY): Payer: Medicare Other

## 2018-05-19 LAB — CBC WITH DIFFERENTIAL/PLATELET
Abs Immature Granulocytes: 0.24 10*3/uL — ABNORMAL HIGH (ref 0.00–0.07)
Basophils Absolute: 0 10*3/uL (ref 0.0–0.1)
Basophils Relative: 0 %
Eosinophils Absolute: 0 10*3/uL (ref 0.0–0.5)
Eosinophils Relative: 0 %
HCT: 40.8 % (ref 39.0–52.0)
Hemoglobin: 12.7 g/dL — ABNORMAL LOW (ref 13.0–17.0)
Immature Granulocytes: 2 %
Lymphocytes Relative: 10 %
Lymphs Abs: 1.2 10*3/uL (ref 0.7–4.0)
MCH: 28 pg (ref 26.0–34.0)
MCHC: 31.1 g/dL (ref 30.0–36.0)
MCV: 90.1 fL (ref 80.0–100.0)
Monocytes Absolute: 1.2 10*3/uL — ABNORMAL HIGH (ref 0.1–1.0)
Monocytes Relative: 10 %
Neutro Abs: 9.4 10*3/uL — ABNORMAL HIGH (ref 1.7–7.7)
Neutrophils Relative %: 78 %
Platelets: 250 10*3/uL (ref 150–400)
RBC: 4.53 MIL/uL (ref 4.22–5.81)
RDW: 13.9 % (ref 11.5–15.5)
WBC: 12.1 10*3/uL — AB (ref 4.0–10.5)
nRBC: 0 % (ref 0.0–0.2)

## 2018-05-19 LAB — PROTIME-INR
INR: 2.21
Prothrombin Time: 24.2 seconds — ABNORMAL HIGH (ref 11.4–15.2)

## 2018-05-19 LAB — BASIC METABOLIC PANEL
Anion gap: 11 (ref 5–15)
BUN: 47 mg/dL — ABNORMAL HIGH (ref 8–23)
CO2: 31 mmol/L (ref 22–32)
Calcium: 8.6 mg/dL — ABNORMAL LOW (ref 8.9–10.3)
Chloride: 97 mmol/L — ABNORMAL LOW (ref 98–111)
Creatinine, Ser: 1.3 mg/dL — ABNORMAL HIGH (ref 0.61–1.24)
GFR calc Af Amer: 59 mL/min — ABNORMAL LOW (ref >60)
GFR calc non Af Amer: 51 mL/min — ABNORMAL LOW (ref >60)
Glucose, Bld: 103 mg/dL — ABNORMAL HIGH (ref 70–99)
Potassium: 4.5 mmol/L (ref 3.5–5.1)
SODIUM: 139 mmol/L (ref 135–145)

## 2018-05-19 LAB — MAGNESIUM: MAGNESIUM: 2.6 mg/dL — AB (ref 1.7–2.4)

## 2018-05-19 MED ORDER — PREDNISONE 20 MG PO TABS: 40.0000 mg | ORAL_TABLET | Freq: Every day | ORAL | 0 refills | Status: AC

## 2018-05-19 MED ORDER — CEFUROXIME AXETIL 500 MG PO TABS: 500.0000 mg | ORAL_TABLET | Freq: Two times a day (BID) | ORAL | 0 refills | Status: AC

## 2018-05-19 MED ORDER — WARFARIN SODIUM 5 MG PO TABS: 5.0000 mg | ORAL_TABLET | Freq: Once | ORAL | Status: DC

## 2018-05-19 NOTE — Progress Notes (Signed)
ANTICOAGULATION CONSULT NOTE - Follow up  Pharmacy Consult for Warfarin Indication: atrial fibrillation  No Known Allergies   Height: 6' (182.9 cm) Weight: 204 lb (92.5 kg) IBW/kg (Calculated) : 77.6   Vital Signs: Temp: 97.5 F (36.4 C) (12/28 0550) Temp Source: Oral (12/28 0550) BP: 141/79 (12/28 0550) Pulse Rate: 81 (12/28 0550)  Labs: Recent Labs    05/16/18 1101 05/16/18 1534  05/17/18 0412 05/18/18 0420 05/19/18 0557  HGB  --   --    < > 12.5* 12.2* 12.7*  HCT  --   --   --  40.7 39.6 40.8  PLT  --   --   --  223 212 250  LABPROT  --   --   --  31.0* 29.3* 24.2*  INR  --   --   --  3.04 2.82 2.21  CREATININE  --   --   --  1.20 1.15 1.30*  TROPONINI <0.03 <0.03  --   --   --   --    < > = values in this interval not displayed.    Estimated Creatinine Clearance: 48.1 mL/min (A) (by C-G formula based on SCr of 1.3 mg/dL (H)).   Medical History: Past Medical History:  Diagnosis Date  . AAA (abdominal aortic aneurysm) (HCC)    4.2 X 4.8 cm 04/2013 CT; declined re-eval 02/25/15  . Anxiety   . CAD in native artery March 2007   Cath for dyspnea on exertion: 75% distal LM, 85% RI, 95% mid-distal Cx, in multiple RCA lesions with 95% distal. --> Referred for CABG; has declined further noninvasive evaluation in the absence of worsening symptoms  . COPD (chronic obstructive pulmonary disease) with emphysema (HCC) 05/23/2007   Hosp 5/29-6/01/12- COPD exacerbation ONOX 12/08/10- desaturated to less than 88% for over an hour, qualifying for home O2 during sleep    . Diverticulosis of colon with hemorrhage 05/24/2013  . Dyslipidemia, goal LDL below 70   . History of non-ST Elevation MI (myocardial infarction) March 2007   Admitted with COPD exacerbation, given by CHF. Had some chest pain with positive troponins. Cath showed multivessel disease, referred for CABG  . History of pneumonia   . Hypertension   . Hypoxia     chronic, on home O2  . Obesity (BMI 30-39.9)    One  year ago weighed 265 pounds, (12/2012) -- now 234 pounds  . PAF (paroxysmal atrial fibrillation) (HCC)    Anticoagulated on warfarin. Stable -- post op  . Pneumonia    hx  . 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 Seasonal allergies   . Shortness of breath dyspnea     Medications:  Scheduled:  . amLODipine  10 mg Oral Daily  . aspirin EC  81 mg Oral Daily  . atorvastatin  40 mg Oral q1800  . furosemide  40 mg Intravenous BID  . ipratropium-albuterol  3 mL Nebulization Q6H  . mouth rinse  15 mL Mouth Rinse BID  . methylPREDNISolone (SOLU-MEDROL) injection  40 mg Intravenous Daily  . mometasone-formoterol  2 puff Inhalation BID  . potassium chloride SA  20 mEq Oral Daily  . Warfarin - Pharmacist Dosing Inpatient   Does not apply q1800   Infusions:  . azithromycin 500 mg (05/18/18 1932)  . cefTRIAXone (ROCEPHIN)  IV 1 g (05/18/18 1802)    Assessment: [age] yoM with hx of AAA, CAD, COPD on chronic warfarin for A-fib now with  SOB. HD 2.5 mg Su/Tu/Th and 5 mg M/W/F/Sa.  LD 12/24 INR=2.61 on admission.   05/19/2018  INR therapeutic after inpatient warfarin doses of 5, 1, 2.5 mg  Concomitant azithromycin and low-dose ASA noted  Hgb and Plts stable  No reported bleeding  Eating well per meal charting  Goal of Therapy:  INR 2-3   Plan:  1) Warfarin 5 mg today as per reported home regimen above 2) Daily INR while inpatient  Elie Goodyandy Richardo Popoff, PharmD, BCPS [phone] 05/19/2018  8:35 AM

## 2018-05-19 NOTE — Discharge Summary (Signed)
Physician Discharge Summary  Roberto Reilly WUJ:[id number] DOB: [birth year] DOA: 05/15/2018  PCP: Pearson Grippe, MD  Admit date: 05/15/2018 Discharge date: 05/19/2018  Admitted From: Home Disposition:  Home  Recommendations for Outpatient Follow-up:  1. Follow up with PCP in 1week with repeat CBC/BMP/INR 2. Follow-up in the ED if symptoms worsen or new appear   Home Health: Home health PT and RN Equipment/Devices: Oxygen via nasal cannula at 4 L/min which is his baseline oxygen requirement at home  Discharge Condition: Guarded CODE STATUS: DNR Diet recommendation: Heart Healthy /fluid restriction of up to 1200 cc a day  Brief/Interim Summary: 84 year old male with history of CAD status post CABG, diastolic CHF with last EF of 60 to 65% in August 2018, chronic kidney disease stage II with baseline creatinine around 1.3, atrial fibrillation, COPD on home oxygen presented on 05/15/2018 with worsening shortness of breath and cough.  Chest x-ray in the ED was concerning for pneumonia.  There was also concern for fluid overload.  Patient was given intravenous Lasix and intravenous antibiotics.  He was also started on Solu-Medrol for COPD exacerbation.  His condition gradually improved.  He is currently back to his baseline respiratory status and wants to go home.  he will be discharged home on oral antibiotics, Lasix and prednisone.  Discharge Diagnoses:  Principal Problem:   Acute respiratory failure with hypoxia (HCC) Active Problems:   S/P CABG x 4   Paroxysmal atrial fibrillation (HCC); CHA2DS2-VASc Score = 4. On Warfarin   Essential hypertension   COPD mixed type (HCC)   AAA (abdominal aortic aneurysm) without rupture seen on CT scan   Chronic renal failure syndrome, stage 3 (moderate) (HCC)   Chronic diastolic CHF (congestive heart failure), NYHA class 2 (HCC)   Community acquired pneumonia  Acute on chronic hypoxic respiratory failure -Probably secondary to combination of  pneumonia/COPD exacerbation/fluid overload -Improving.  Currently on 4 L nasal cannula oxygen which is his home oxygen requirement.  Probable community-acquired bacterial right lower lobar pneumonia -Currently on Rocephin and Zithromax. Influenza a and B negative.  Strep pneumo urinary antigen negative. -Discharge home on oral Ceftin for 3 more days.  COPD exacerbation -Improving.    Started on Solu-Medrol..   -Respiratory status is improved.  Discharge home on oral prednisone 40 mg daily for 7 days.  Continue other inhalers/nebs at home.  Acute on chronic diastolic CHF -Was put on Lasix 40 mg IV every 12 hours.  Has diuresed well.  Discharged home on his current home regimen of oral Lasix.  Outpatient follow-up of BMP.  Might consider outpatient cardiology evaluation and follow-up.  Chronic kidney disease stage II-III -Creatinine stable.  Outpatient follow-up.  Coronary artery disease status post CABG -No current chest pain.  Troponins negative.  Continue aspirin and statin.  Outpatient follow-up  Paroxysmal atrial fibrillation on Coumadin -Continue Coumadin.  INR therapeutic today.    Outpatient follow-up of INR.  Dyslipidemia  -continue statins  Generalized deconditioning -PT recommends home health PT. discharge home with home health PT and RN.   Discharge Instructions  Discharge Instructions    (HEART FAILURE PATIENTS) Call MD:  Anytime you have any of the following symptoms: 1) 3 pound weight gain in 24 hours or 5 pounds in 1 week 2) shortness of breath, with or without a dry hacking cough 3) swelling in the hands, feet or stomach 4) if you have to sleep on extra pillows at night in order to breathe.   Complete by:  As directed  Call MD for:  difficulty breathing, headache or visual disturbances   Complete by:  As directed    Call MD for:  extreme fatigue   Complete by:  As directed    Call MD for:  temperature >100.4   Complete by:  As directed    Diet - low  sodium heart healthy   Complete by:  As directed    Increase activity slowly   Complete by:  As directed      Allergies as of 05/19/2018   No Known Allergies     Medication List    STOP taking these medications   amoxicillin 500 MG tablet Commonly known as:  AMOXIL   OXYGEN     TAKE these medications   amLODipine 10 MG tablet Commonly known as:  NORVASC Take 1 tablet (10 mg total) by mouth daily.   aspirin EC 81 MG tablet Take 81 mg by mouth every morning.   atorvastatin 40 MG tablet Commonly known as:  LIPITOR Take 1 tablet (40 mg total) by mouth daily.   budesonide-formoterol 160-4.5 MCG/ACT inhaler Commonly known as:  SYMBICORT Inhale 2 puffs into the lungs 2 (two) times daily.   cefUROXime 500 MG tablet Commonly known as:  CEFTIN Take 1 tablet (500 mg total) by mouth 2 (two) times daily for 3 days.   cholecalciferol 1000 units tablet Commonly known as:  VITAMIN D Take 1,000 Units by mouth every morning.   DUONEB 0.5-2.5 (3) MG/3ML Soln Generic drug:  ipratropium-albuterol Take 3 mLs by nebulization every 4 (four) hours as needed (wheezing and shortness of breath).   furosemide 40 MG tablet Commonly known as:  LASIX Take 1 tablet (40 mg total) daily by mouth. May take an extra 40 mg tablet daily if weight is above 213 lbs ,if needed   potassium chloride SA 20 MEQ tablet Commonly known as:  K-DUR,KLOR-CON Take 1 tablet (20 mEq total) by mouth daily.   predniSONE 20 MG tablet Commonly known as:  DELTASONE Take 2 tablets (40 mg total) by mouth daily for 7 days.   warfarin 2.5 MG tablet Commonly known as:  COUMADIN TAKE ONE TO TWO TABLETS BY MOUTH AS INSTRUCTED BY COUMADIN CLINIC What changed:    how much to take  how to take this  when to take this  additional instructions      Follow-up Information    Pearson Grippe, MD. Schedule an appointment as soon as possible for a visit in 1 week(s).   Specialty:  Internal Medicine Why:  with repeat  cbc/bmp/inr Contact information: [address] [place] [address] [place] [phone]          No Known Allergies  Consultations: NONE  Procedures/Studies: Dg Chest 2 View  Result Date: 05/15/2018 CLINICAL DATA:  Acute onset of shortness of breath and productive cough. EXAM: CHEST - 2 VIEW COMPARISON:  Chest radiograph performed 04/13/2018 FINDINGS: The patient is status post right lower lobe lung resection. A small right pleural effusion is noted. Right basilar airspace opacity may reflect atelectasis or possibly mild pneumonia, given the patient's symptoms. Underlying vascular congestion is noted. No pneumothorax is seen. The cardiomediastinal silhouette is borderline normal. The patient is status post median sternotomy, with evidence of prior CABG. No acute osseous abnormalities are identified. IMPRESSION: 1. Small right pleural effusion noted. Right basilar airspace opacity may reflect atelectasis or possibly mild pneumonia, given the patient's symptoms. 2. Underlying vascular congestion noted. Electronically Signed   By: Beryle Beams.D.  On: 05/15/2018 21:35      Subjective: Patient seen and examined at bedside.  He feels much better and wants to go home today.  He made a good amount of urine overnight.  No overnight fever or vomiting.  No worsening shortness of breath.  Discharge Exam: Vitals:   05/19/18 0435 05/19/18 0550  BP:  (!) 141/79  Pulse:  81  Resp:  (!) 22  Temp:  (!) 97.5 F (36.4 C)  SpO2: 93% 95%   Vitals:   05/18/18 1310 05/18/18 2208 05/19/18 0435 05/19/18 0550  BP: 125/63   (!) 141/79  Pulse: 85   81  Resp: 20   (!) 22  Temp: 97.8 F (36.6 C)   (!) 97.5 F (36.4 C)  TempSrc: Oral   Oral  SpO2: 91% 95% 93% 95%  Weight:      Height:        General: Pt is alert, awake, not in acute distress Cardiovascular: rate controlled, S1/S2 +, slightly tachypneic Respiratory: bilateral decreased breath sounds at bases, basilar  crackles Abdominal: Soft, NT, ND, bowel sounds + Extremities: Trace edema, no cyanosis    The results of significant diagnostics from this hospitalization (including imaging, microbiology, ancillary and laboratory) are listed below for reference.     Microbiology: Recent Results (from the past 240 hour(s))  Culture, sputum-assessment     Status: None   Collection Time: 05/16/18  3:53 AM  Result Value Ref Range Status   Specimen Description SPUTUM  Final   Special Requests NONE  Final   Sputum evaluation   Final    THIS SPECIMEN IS ACCEPTABLE FOR SPUTUM CULTURE Performed at [facility], [address] [place], [place] [address]    Report Status 05/16/2018 FINAL  Final  Culture, respiratory     Status: None   Collection Time: 05/16/18  3:53 AM  Result Value Ref Range Status   Specimen Description   Final    SPUTUM Performed at [facility], [address] [place], [place] [address]    Special Requests   Final    NONE Reflexed from [id number] Performed at [facility], [address] [place], [place] [address]    Gram Stain NO WBC SEEN RARE GRAM POSITIVE COCCI IN PAIRS   Final   Culture   Final    Consistent with normal respiratory flora. Performed at [facility], [address] [place], [place] [address]    Report Status 05/18/2018 FINAL  Final     Labs: BNP (last 3 results) Recent Labs    07/05/17 0847 04/13/18 0833 05/15/18 2208  BNP 156.2* 141.8* 181.0*   Basic Metabolic Panel: Recent Labs  Lab 05/15/18 2055 05/16/18 0425 05/17/18 0412 05/18/18 0420 05/19/18 0557  NA 142 141 138 138 139  K 4.5 5.3* 4.9 4.8 4.5  CL 94* 96* 96* 97* 97*  CO2 36* 34* 31 30 31   GLUCOSE 132* 170* 146* 138* 103*  BUN 33* 35* 57* 49* 47*  CREATININE 1.32* 1.39* 1.20 1.15 1.30*  CALCIUM 9.0 8.5* 8.6* 8.6* 8.6*  MG  --  2.6* 2.9* 2.6* 2.6*   Liver Function Tests: Recent Labs  Lab 05/16/18 0425  05/17/18 0412  AST 24 20  ALT 18 16  ALKPHOS 61 62  BILITOT 0.6 0.5  PROT 6.5 6.4*  ALBUMIN 3.5 3.7   No results for input(s): LIPASE, AMYLASE in the last 168 hours. No results for input(s): AMMONIA in the last  168 hours. CBC: Recent Labs  Lab 05/15/18 2055 05/16/18 0425 05/17/18 0412 05/18/18 0420 05/19/18 0557  WBC 14.2* 12.0* 11.9* 11.3* 12.1*  NEUTROABS  --  11.1* 10.7* 10.0* 9.4*  HGB 13.9 12.4* 12.5* 12.2* 12.7*  HCT 46.1 41.4 40.7 39.6 40.8  MCV 95.2 95.4 91.1 91.7 90.1  PLT 241 192 223 212 250   Cardiac Enzymes: Recent Labs  Lab 05/16/18 0425 05/16/18 1101 05/16/18 1534  TROPONINI <0.03 <0.03 <0.03   BNP: Invalid input(s): POCBNP CBG: No results for input(s): GLUCAP in the last 168 hours. D-Dimer No results for input(s): DDIMER in the last 72 hours. Hgb A1c No results for input(s): HGBA1C in the last 72 hours. Lipid Profile No results for input(s): CHOL, HDL, LDLCALC, TRIG, CHOLHDL, LDLDIRECT in the last 72 hours. Thyroid function studies No results for input(s): TSH, T4TOTAL, T3FREE, THYROIDAB in the last 72 hours.  Invalid input(s): FREET3 Anemia work up No results for input(s): VITAMINB12, FOLATE, FERRITIN, TIBC, IRON, RETICCTPCT in the last 72 hours. Urinalysis    Component Value Date/Time   COLORURINE YELLOW 07/05/2017 1040   APPEARANCEUR CLEAR 07/05/2017 1040   LABSPEC 1.010 07/05/2017 1040   PHURINE 6.0 07/05/2017 1040   GLUCOSEU NEGATIVE 07/05/2017 1040   HGBUR NEGATIVE 07/05/2017 1040   BILIRUBINUR NEGATIVE 07/05/2017 1040   KETONESUR NEGATIVE 07/05/2017 1040   PROTEINUR NEGATIVE 07/05/2017 1040   UROBILINOGEN 0.2 10/19/2010 1150   NITRITE NEGATIVE 07/05/2017 1040   LEUKOCYTESUR NEGATIVE 07/05/2017 1040   Sepsis Labs Invalid input(s): PROCALCITONIN,  WBC,  LACTICIDVEN Microbiology Recent Results (from the past 240 hour(s))  Culture, sputum-assessment     Status: None   Collection Time: 05/16/18  3:53 AM  Result Value Ref Range  Status   Specimen Description SPUTUM  Final   Special Requests NONE  Final   Sputum evaluation   Final    THIS SPECIMEN IS ACCEPTABLE FOR SPUTUM CULTURE Performed at [facility], [address] [place], [place] [address]    Report Status 05/16/2018 FINAL  Final  Culture, respiratory     Status: None   Collection Time: 05/16/18  3:53 AM  Result Value Ref Range Status   Specimen Description   Final    SPUTUM Performed at [facility], [address] [place], [place] [address]    Special Requests   Final    NONE Reflexed from [id number] Performed at [facility], [address] [place], [place] [address]    Gram Stain NO WBC SEEN RARE GRAM POSITIVE COCCI IN PAIRS   Final   Culture   Final    Consistent with normal respiratory flora. Performed at [facility], [address] [place], [place] [address]    Report Status 05/18/2018 FINAL  Final     Time coordinating discharge: 35 minutes  SIGNED:   Glade LloydKshitiz Anet Logsdon, MD  [facility] 05/19/2018, 9:41 AM Pager: [phone]  If 7PM-7AM, please contact night-coverage [email or web address] Password [email or web address]

## 2018-06-05 ENCOUNTER — Other Ambulatory Visit: Payer: Self-pay | Admitting: Cardiology

## 2018-06-07 ENCOUNTER — Other Ambulatory Visit: Payer: Self-pay

## 2018-06-07 ENCOUNTER — Emergency Department (HOSPITAL_COMMUNITY): Payer: Medicare Other

## 2018-06-07 ENCOUNTER — Inpatient Hospital Stay (HOSPITAL_COMMUNITY)
Admission: EM | Admit: 2018-06-07 | Discharge: 2018-06-10 | DRG: 190 | Disposition: A | Discharge status: Home Health | Payer: Medicare Other | Attending: Internal Medicine | Admitting: Internal Medicine

## 2018-06-07 ENCOUNTER — Encounter (HOSPITAL_COMMUNITY): Payer: Self-pay

## 2018-06-07 DIAGNOSIS — I130 Hypertensive heart and chronic kidney disease with heart failure and stage 1 through stage 4 chronic kidney disease, or unspecified chronic kidney disease: Secondary | ICD-10-CM | POA: Diagnosis present

## 2018-06-07 DIAGNOSIS — Z79899 Other long term (current) drug therapy: Secondary | ICD-10-CM

## 2018-06-07 DIAGNOSIS — I5032 Chronic diastolic (congestive) heart failure: Secondary | ICD-10-CM | POA: Diagnosis present

## 2018-06-07 DIAGNOSIS — I714 Abdominal aortic aneurysm, without rupture: Secondary | ICD-10-CM | POA: Diagnosis present

## 2018-06-07 DIAGNOSIS — J9621 Acute and chronic respiratory failure with hypoxia: Secondary | ICD-10-CM | POA: Diagnosis not present

## 2018-06-07 DIAGNOSIS — I4510 Unspecified right bundle-branch block: Secondary | ICD-10-CM | POA: Diagnosis not present

## 2018-06-07 DIAGNOSIS — F419 Anxiety disorder, unspecified: Secondary | ICD-10-CM | POA: Diagnosis present

## 2018-06-07 DIAGNOSIS — I4891 Unspecified atrial fibrillation: Secondary | ICD-10-CM | POA: Diagnosis not present

## 2018-06-07 DIAGNOSIS — R791 Abnormal coagulation profile: Secondary | ICD-10-CM | POA: Diagnosis present

## 2018-06-07 DIAGNOSIS — I4811 Longstanding persistent atrial fibrillation: Secondary | ICD-10-CM

## 2018-06-07 DIAGNOSIS — E669 Obesity, unspecified: Secondary | ICD-10-CM | POA: Diagnosis present

## 2018-06-07 DIAGNOSIS — J432 Centrilobular emphysema: Principal | ICD-10-CM | POA: Diagnosis present

## 2018-06-07 DIAGNOSIS — N183 Chronic kidney disease, stage 3 unspecified: Secondary | ICD-10-CM | POA: Diagnosis present

## 2018-06-07 DIAGNOSIS — Z7982 Long term (current) use of aspirin: Secondary | ICD-10-CM

## 2018-06-07 DIAGNOSIS — Z6826 Body mass index (BMI) 26.0-26.9, adult: Secondary | ICD-10-CM

## 2018-06-07 DIAGNOSIS — R0603 Acute respiratory distress: Secondary | ICD-10-CM | POA: Diagnosis not present

## 2018-06-07 DIAGNOSIS — Z8249 Family history of ischemic heart disease and other diseases of the circulatory system: Secondary | ICD-10-CM

## 2018-06-07 DIAGNOSIS — I2510 Atherosclerotic heart disease of native coronary artery without angina pectoris: Secondary | ICD-10-CM

## 2018-06-07 DIAGNOSIS — Z87891 Personal history of nicotine dependence: Secondary | ICD-10-CM

## 2018-06-07 DIAGNOSIS — J441 Chronic obstructive pulmonary disease with (acute) exacerbation: Secondary | ICD-10-CM

## 2018-06-07 DIAGNOSIS — E785 Hyperlipidemia, unspecified: Secondary | ICD-10-CM | POA: Diagnosis present

## 2018-06-07 DIAGNOSIS — I959 Hypotension, unspecified: Secondary | ICD-10-CM | POA: Diagnosis not present

## 2018-06-07 DIAGNOSIS — Z902 Acquired absence of lung [part of]: Secondary | ICD-10-CM

## 2018-06-07 DIAGNOSIS — R062 Wheezing: Secondary | ICD-10-CM | POA: Diagnosis not present

## 2018-06-07 DIAGNOSIS — I10 Essential (primary) hypertension: Secondary | ICD-10-CM

## 2018-06-07 DIAGNOSIS — Z5329 Procedure and treatment not carried out because of patient's decision for other reasons: Secondary | ICD-10-CM | POA: Diagnosis present

## 2018-06-07 DIAGNOSIS — R05 Cough: Secondary | ICD-10-CM | POA: Diagnosis not present

## 2018-06-07 DIAGNOSIS — I872 Venous insufficiency (chronic) (peripheral): Secondary | ICD-10-CM | POA: Diagnosis present

## 2018-06-07 DIAGNOSIS — Z951 Presence of aortocoronary bypass graft: Secondary | ICD-10-CM

## 2018-06-07 DIAGNOSIS — Z66 Do not resuscitate: Secondary | ICD-10-CM | POA: Diagnosis present

## 2018-06-07 DIAGNOSIS — R059 Cough, unspecified: Secondary | ICD-10-CM

## 2018-06-07 DIAGNOSIS — I480 Paroxysmal atrial fibrillation: Secondary | ICD-10-CM | POA: Diagnosis present

## 2018-06-07 DIAGNOSIS — R0602 Shortness of breath: Secondary | ICD-10-CM | POA: Diagnosis not present

## 2018-06-07 DIAGNOSIS — Z7951 Long term (current) use of inhaled steroids: Secondary | ICD-10-CM

## 2018-06-07 DIAGNOSIS — Z7901 Long term (current) use of anticoagulants: Secondary | ICD-10-CM

## 2018-06-07 DIAGNOSIS — I252 Old myocardial infarction: Secondary | ICD-10-CM

## 2018-06-07 DIAGNOSIS — H9190 Unspecified hearing loss, unspecified ear: Secondary | ICD-10-CM | POA: Diagnosis present

## 2018-06-07 DIAGNOSIS — Z9981 Dependence on supplemental oxygen: Secondary | ICD-10-CM

## 2018-06-07 DIAGNOSIS — J9690 Respiratory failure, unspecified, unspecified whether with hypoxia or hypercapnia: Secondary | ICD-10-CM | POA: Diagnosis present

## 2018-06-07 DIAGNOSIS — Z89431 Acquired absence of right foot: Secondary | ICD-10-CM

## 2018-06-07 LAB — CBC WITH DIFFERENTIAL/PLATELET
Abs Immature Granulocytes: 0.03 10*3/uL (ref 0.00–0.07)
Basophils Absolute: 0 10*3/uL (ref 0.0–0.1)
Basophils Relative: 0 %
Eosinophils Absolute: 0.1 10*3/uL (ref 0.0–0.5)
Eosinophils Relative: 1 %
HCT: 45.1 % (ref 39.0–52.0)
Hemoglobin: 13.4 g/dL (ref 13.0–17.0)
Immature Granulocytes: 0 %
Lymphocytes Relative: 19 %
Lymphs Abs: 1.4 10*3/uL (ref 0.7–4.0)
MCH: 28 pg (ref 26.0–34.0)
MCHC: 29.7 g/dL — ABNORMAL LOW (ref 30.0–36.0)
MCV: 94.2 fL (ref 80.0–100.0)
Monocytes Absolute: 0.6 10*3/uL (ref 0.1–1.0)
Monocytes Relative: 8 %
Neutro Abs: 5.5 10*3/uL (ref 1.7–7.7)
Neutrophils Relative %: 72 %
Platelets: 131 10*3/uL — ABNORMAL LOW (ref 150–400)
RBC: 4.79 MIL/uL (ref 4.22–5.81)
RDW: 14.5 % (ref 11.5–15.5)
WBC: 7.7 10*3/uL (ref 4.0–10.5)
nRBC: 0 % (ref 0.0–0.2)

## 2018-06-07 LAB — COMPREHENSIVE METABOLIC PANEL
ALT: 18 U/L (ref 0–44)
AST: 22 U/L (ref 15–41)
Albumin: 4.4 g/dL (ref 3.5–5.0)
Alkaline Phosphatase: 69 U/L (ref 38–126)
Anion gap: 12 (ref 5–15)
BUN: 21 mg/dL (ref 8–23)
CO2: 34 mmol/L — AB (ref 22–32)
Calcium: 9 mg/dL (ref 8.9–10.3)
Chloride: 96 mmol/L — ABNORMAL LOW (ref 98–111)
Creatinine, Ser: 1.19 mg/dL (ref 0.61–1.24)
GFR calc non Af Amer: 57 mL/min — ABNORMAL LOW (ref >60)
Glucose, Bld: 102 mg/dL — ABNORMAL HIGH (ref 70–99)
Potassium: 4.1 mmol/L (ref 3.5–5.1)
SODIUM: 142 mmol/L (ref 135–145)
Total Bilirubin: 1.2 mg/dL (ref 0.3–1.2)
Total Protein: 7.2 g/dL (ref 6.5–8.1)

## 2018-06-07 LAB — URINALYSIS, ROUTINE W REFLEX MICROSCOPIC
Bilirubin Urine: NEGATIVE
Glucose, UA: 50 mg/dL — AB
Hgb urine dipstick: NEGATIVE
Ketones, ur: NEGATIVE mg/dL
Leukocytes, UA: NEGATIVE
Nitrite: NEGATIVE
Protein, ur: NEGATIVE mg/dL
Specific Gravity, Urine: >1.046 — ABNORMAL HIGH (ref 1.005–1.030)
pH: 7 (ref 5.0–8.0)

## 2018-06-07 LAB — I-STAT CG4 LACTIC ACID, ED: Lactic Acid, Venous: 1.75 mmol/L (ref 0.5–1.9)

## 2018-06-07 LAB — I-STAT TROPONIN, ED: Troponin i, poc: 0.01 ng/mL (ref 0.00–0.08)

## 2018-06-07 LAB — PROTIME-INR
INR: 2.97
Prothrombin Time: 30.5 seconds — ABNORMAL HIGH (ref 11.4–15.2)

## 2018-06-07 LAB — BRAIN NATRIURETIC PEPTIDE
B Natriuretic Peptide: 133.1 pg/mL — ABNORMAL HIGH (ref 0.0–100.0)
B Natriuretic Peptide: 187.3 pg/mL — ABNORMAL HIGH (ref 0.0–100.0)

## 2018-06-07 LAB — TSH: TSH: 0.229 u[IU]/mL — ABNORMAL LOW (ref 0.350–4.500)

## 2018-06-07 LAB — TROPONIN I

## 2018-06-07 MED ORDER — POTASSIUM CHLORIDE CRYS ER 20 MEQ PO TBCR
20.0000 meq | EXTENDED_RELEASE_TABLET | Freq: Every day | ORAL | Status: DC
  Administered 2018-06-08 – 2018-06-10 (×3): 20 meq via ORAL
  Filled 2018-06-07 (×3): qty 1

## 2018-06-07 MED ORDER — ASPIRIN EC 81 MG PO TBEC
81.0000 mg | DELAYED_RELEASE_TABLET | ORAL | Status: DC
  Administered 2018-06-08 – 2018-06-10 (×3): 81 mg via ORAL
  Filled 2018-06-07 (×3): qty 1

## 2018-06-07 MED ORDER — ACETAMINOPHEN 650 MG RE SUPP: 650.0000 mg | Freq: Four times a day (QID) | RECTAL | Status: DC | PRN

## 2018-06-07 MED ORDER — FLUTICASONE FUROATE-VILANTEROL 200-25 MCG/INH IN AEPB
1.0000 | INHALATION_SPRAY | Freq: Every day | RESPIRATORY_TRACT | Status: DC
  Administered 2018-06-07 – 2018-06-10 (×4): 1 via RESPIRATORY_TRACT
  Filled 2018-06-07: qty 28

## 2018-06-07 MED ORDER — ATORVASTATIN CALCIUM 40 MG PO TABS
40.0000 mg | ORAL_TABLET | Freq: Every day | ORAL | Status: DC
  Administered 2018-06-08 – 2018-06-10 (×3): 40 mg via ORAL
  Filled 2018-06-07 (×3): qty 1

## 2018-06-07 MED ORDER — ACETAMINOPHEN 325 MG PO TABS: 650.0000 mg | ORAL_TABLET | Freq: Four times a day (QID) | ORAL | Status: DC | PRN

## 2018-06-07 MED ORDER — AMLODIPINE BESYLATE 5 MG PO TABS
10.0000 mg | ORAL_TABLET | Freq: Every day | ORAL | Status: DC
  Administered 2018-06-07 – 2018-06-10 (×4): 10 mg via ORAL
  Filled 2018-06-07 (×4): qty 2

## 2018-06-07 MED ORDER — IPRATROPIUM-ALBUTEROL 0.5-2.5 (3) MG/3ML IN SOLN
3.0000 mL | RESPIRATORY_TRACT | Status: DC | PRN
  Administered 2018-06-07: 3 mL via RESPIRATORY_TRACT
  Filled 2018-06-07: qty 3

## 2018-06-07 MED ORDER — VITAMIN D 25 MCG (1000 UNIT) PO TABS
1000.0000 [IU] | ORAL_TABLET | ORAL | Status: DC
  Administered 2018-06-08 – 2018-06-10 (×3): 1000 [IU] via ORAL
  Filled 2018-06-07 (×3): qty 1

## 2018-06-07 MED ORDER — DOCUSATE SODIUM 100 MG PO CAPS
100.0000 mg | ORAL_CAPSULE | Freq: Two times a day (BID) | ORAL | Status: DC
  Administered 2018-06-07 – 2018-06-10 (×6): 100 mg via ORAL
  Filled 2018-06-07 (×6): qty 1

## 2018-06-07 MED ORDER — GUAIFENESIN ER 600 MG PO TB12
600.0000 mg | ORAL_TABLET | Freq: Two times a day (BID) | ORAL | Status: DC
  Administered 2018-06-07 – 2018-06-10 (×6): 600 mg via ORAL
  Filled 2018-06-07 (×6): qty 1

## 2018-06-07 MED ORDER — HYDRALAZINE HCL 20 MG/ML IJ SOLN: 10.0000 mg | Freq: Four times a day (QID) | INTRAMUSCULAR | Status: DC | PRN

## 2018-06-07 MED ORDER — IOHEXOL 300 MG/ML  SOLN
75.0000 mL | Freq: Once | INTRAMUSCULAR | Status: AC | PRN
  Administered 2018-06-07: 75 mL via INTRAVENOUS

## 2018-06-07 MED ORDER — METHYLPREDNISOLONE SODIUM SUCC 40 MG IJ SOLR
40.0000 mg | Freq: Four times a day (QID) | INTRAMUSCULAR | Status: DC
  Administered 2018-06-07 – 2018-06-10 (×11): 40 mg via INTRAVENOUS
  Filled 2018-06-07 (×11): qty 1

## 2018-06-07 MED ORDER — ONDANSETRON HCL 4 MG/2ML IJ SOLN: 4.0000 mg | Freq: Four times a day (QID) | INTRAMUSCULAR | Status: DC | PRN

## 2018-06-07 MED ORDER — DOXYCYCLINE HYCLATE 100 MG PO TABS
100.0000 mg | ORAL_TABLET | Freq: Two times a day (BID) | ORAL | Status: DC
  Administered 2018-06-07 – 2018-06-10 (×6): 100 mg via ORAL
  Filled 2018-06-07 (×6): qty 1

## 2018-06-07 MED ORDER — HYDROCODONE-ACETAMINOPHEN 5-325 MG PO TABS: 1.0000 | ORAL_TABLET | ORAL | Status: DC | PRN

## 2018-06-07 MED ORDER — POLYETHYLENE GLYCOL 3350 17 G PO PACK: 17.0000 g | PACK | Freq: Every day | ORAL | Status: DC | PRN

## 2018-06-07 MED ORDER — IPRATROPIUM-ALBUTEROL 0.5-2.5 (3) MG/3ML IN SOLN
3.0000 mL | Freq: Once | RESPIRATORY_TRACT | Status: AC
  Administered 2018-06-07: 3 mL via RESPIRATORY_TRACT
  Filled 2018-06-07: qty 3

## 2018-06-07 MED ORDER — WARFARIN - PHARMACIST DOSING INPATIENT: Freq: Every day | Status: DC

## 2018-06-07 MED ORDER — BISACODYL 10 MG RE SUPP: 10.0000 mg | Freq: Every day | RECTAL | Status: DC | PRN

## 2018-06-07 MED ORDER — WARFARIN SODIUM 2.5 MG PO TABS
2.5000 mg | ORAL_TABLET | Freq: Once | ORAL | Status: AC
  Administered 2018-06-07: 2.5 mg via ORAL
  Filled 2018-06-07 (×2): qty 1

## 2018-06-07 MED ORDER — FUROSEMIDE 40 MG PO TABS
40.0000 mg | ORAL_TABLET | Freq: Every day | ORAL | Status: DC
  Administered 2018-06-08 – 2018-06-10 (×3): 40 mg via ORAL
  Filled 2018-06-07 (×3): qty 1

## 2018-06-07 MED ORDER — SODIUM CHLORIDE (PF) 0.9 % IJ SOLN
INTRAMUSCULAR | Status: AC
  Administered 2018-06-07: 10:00:00
  Filled 2018-06-07: qty 50

## 2018-06-07 MED ORDER — IPRATROPIUM-ALBUTEROL 0.5-2.5 (3) MG/3ML IN SOLN
3.0000 mL | Freq: Four times a day (QID) | RESPIRATORY_TRACT | Status: DC
  Administered 2018-06-08 – 2018-06-10 (×10): 3 mL via RESPIRATORY_TRACT
  Filled 2018-06-07 (×10): qty 3

## 2018-06-07 MED ORDER — METHYLPREDNISOLONE SODIUM SUCC 125 MG IJ SOLR
125.0000 mg | Freq: Once | INTRAMUSCULAR | Status: AC
  Administered 2018-06-07: 125 mg via INTRAVENOUS
  Filled 2018-06-07: qty 2

## 2018-06-07 MED ORDER — ONDANSETRON HCL 4 MG PO TABS: 4.0000 mg | ORAL_TABLET | Freq: Four times a day (QID) | ORAL | Status: DC | PRN

## 2018-06-07 NOTE — ED Notes (Signed)
RT called for neb treatment

## 2018-06-07 NOTE — ED Notes (Addendum)
EKG given to MD Butler 

## 2018-06-07 NOTE — Progress Notes (Signed)
ANTICOAGULATION CONSULT NOTE  Pharmacy Consult for warfarin Indication: atrial fibrillation  No Known Allergies  Patient Measurements: Height: 6' (182.9 cm) Weight: 194 lb (88 kg) IBW/kg (Calculated) : 77.6  Vital Signs: BP: 154/69 (01/16 1255) Pulse Rate: 90 (01/16 1255)  Labs: Recent Labs    06/07/18 0643  HGB 13.4  HCT 45.1  PLT 131*  LABPROT 30.5*  INR 2.97  CREATININE 1.19    Estimated Creatinine Clearance: 52.5 mL/min (by C-G formula based on SCr of 1.19 mg/dL).   Medical History: Past Medical History:  Diagnosis Date  . AAA (abdominal aortic aneurysm) (HCC)    4.2 X 4.8 cm 04/2013 CT; declined re-eval 02/25/15  . Anxiety   . CAD in native artery March 2007   Cath for dyspnea on exertion: 75% distal LM, 85% RI, 95% mid-distal Cx, in multiple RCA lesions with 95% distal. --> Referred for CABG; has declined further noninvasive evaluation in the absence of worsening symptoms  . COPD (chronic obstructive pulmonary disease) with emphysema (HCC) 05/23/2007   Hosp 5/29-6/01/12- COPD exacerbation ONOX 12/08/10- desaturated to less than 88% for over an hour, qualifying for home O2 during sleep    . Diverticulosis of colon with hemorrhage 05/24/2013  . Dyslipidemia, goal LDL below 70   . History of non-ST Elevation MI (myocardial infarction) March 2007   Admitted with COPD exacerbation, given by CHF. Had some chest pain with positive troponins. Cath showed multivessel disease, referred for CABG  . History of pneumonia   . Hypertension   . Hypoxia     chronic, on home O2  . Obesity (BMI 30-39.9)    One year ago weighed 265 pounds, (12/2012) -- now 234 pounds  . PAF (paroxysmal atrial fibrillation) (HCC)    Anticoagulated on warfarin. Stable -- post op  . Pneumonia    hx  . 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 Seasonal allergies   . Shortness of breath dyspnea     Medications:  Warfarin 2.5 mg bid M/W/F/Sa and 2.5 mg daily  Tu/Th/Su  Assessment: 85 y/o M with a h/o PAF on warfarin and recent h/o hospitalization for PNA admitted with AECOPD.   06/07/18 1:42 PM  - INR therapeutic last dose 1/15 - doxycycline ordered which may potentiate INR  Goal of Therapy:  INR 2-3   Plan:  - Warfarin 2.5 mg once tonight - Daily INR - Monitor for s/s bleeding  Luisa Hart D 06/07/2018,1:40 PM

## 2018-06-07 NOTE — ED Notes (Signed)
Patient transported to CT 

## 2018-06-07 NOTE — H&P (Addendum)
[facility] History and Physical  Roberto Reilly BJY:[id number] DOB: Oct 04, 1933 DOA: 06/07/2018  Referring physician: ED  PCP: Pearson Grippe, MD   Chief Complaint: Shortness of breath wheezing  HPI: Roberto Reilly is a 85 y.o. male with past medical history of COPD, myocardial infarction, atrial fibrillation, CABG x4, history of congestive heart failure presented to hospital with shortness of breath cough and wheezing since yesterday.  Patient has been using his home nebulizer treatment without any relief.  At baseline patient uses 3 to 4 L of oxygen for chronic hypoxia.  Patient was recently admitted to the hospital almost 3 weeks back for pneumonia.  Patient states that he has been having productive sputum greenish in color for the last 2 to 3 days.  He however denies any fever, chills or rigor.  Denies any nausea, vomiting or abdominal pain.  Patient denies any chest pain, palpitation, dizziness or lightheadedness.  Patient denies urinary urgency, frequency or dysuria.  ED Course: In the ED, patient received nebulizer and IV Solu-Medrol.  He declined a 1 hour treatment of albuterol and was extremely wheezy despite his initial treatment.  He also had difficulty ambulating and required 5 L of oxygen in the ED with increased work of breathing, so the hospitalist team was consulted for observation in the hospital.  Patient however refused CPAP placement in the ED.  CT scan of the chest was done in the ED which noted evidence of emphysema with right lower lobectomy, patchy tree-in-bud opacities in the right middle and basilar right upper lobes.  Review of Systems:  All systems were reviewed and were negative unless otherwise mentioned in the HPI  Past Medical History:  Diagnosis Date  . AAA (abdominal aortic aneurysm) (HCC)    4.2 X 4.8 cm 04/2013 CT; declined re-eval 02/25/15  . Anxiety   . CAD in native artery March 2007   Cath for dyspnea on exertion: 75% distal LM, 85% RI, 95% mid-distal Cx,  in multiple RCA lesions with 95% distal. --> Referred for CABG; has declined further noninvasive evaluation in the absence of worsening symptoms  . COPD (chronic obstructive pulmonary disease) with emphysema (HCC) 05/23/2007   Hosp 5/29-6/01/12- COPD exacerbation ONOX 12/08/10- desaturated to less than 88% for over an hour, qualifying for home O2 during sleep    . Diverticulosis of colon with hemorrhage 05/24/2013  . Dyslipidemia, goal LDL below 70   . History of non-ST Elevation MI (myocardial infarction) March 2007   Admitted with COPD exacerbation, given by CHF. Had some chest pain with positive troponins. Cath showed multivessel disease, referred for CABG  . History of pneumonia   . Hypertension   . Hypoxia     chronic, on home O2  . Obesity (BMI 30-39.9)    One year ago weighed 265 pounds, (12/2012) -- now 234 pounds  . PAF (paroxysmal atrial fibrillation) (HCC)    Anticoagulated on warfarin. Stable -- post op  . Pneumonia    hx  . 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 Seasonal allergies   . Shortness of breath dyspnea    Past Surgical History:  Procedure Laterality Date  . ACHILLES TENDON LENGTHENING Right 10/08/2015   Procedure: ACHILLES TENDON LENGTHENING;  Surgeon: Toni Arthurs, MD;  Location: [facility] OR;  Service: Orthopedics;  Laterality: Right;  . AMPUTATION Right 10/08/2015   Procedure: RIGHT FOOT TRANSMET AMPUTATION;  Surgeon: Toni Arthurs, MD;  Location: [facility] OR;  Service: Orthopedics;  Laterality: Right;  .  CARDIAC CATHETERIZATION  03 28 2007   NORMAL LV FUNCTION/ ABDOMINAL AORTA STENOSIS,75%-85%. RIGHT FEMORAL ARTERY :CATHETERS USED A  4-FRENCH WITH A 4-FRENCH SHEATH  . CATARACT EXTRACTION     Laser  . COLONOSCOPY N/A 05/24/2013   Procedure: COLONOSCOPY;  Surgeon: Iva Boop, MD;  Location: [facility] END[facility]COPY;  Service: Endoscopy;  Laterality: N/A;  . CORONARY ARTERY BYPASS GRAFT    . RLL resection for Hamartoma     lungs  . RUL for hamartoma     lung  .  TRANSTHORACIC ECHOCARDIOGRAM  12/2016   Normal LV size and function.  EF 60-65% - no RWMA.  Mild RA dilation    Social History:  reports that he quit smoking about 20 years ago. His smoking use included cigarettes. He has a 110.00 pack-year smoking history. He has never used smokeless tobacco. He reports that he does not drink alcohol or use drugs.  No Known Allergies  Family History  Problem Relation Age of Onset  . Heart attack Mother   . Heart attack Father      Prior to Admission medications   Medication Sig Start Date End Date Taking? Authorizing Provider  amLODipine (NORVASC) 10 MG tablet Take 1 tablet (10 mg total) by mouth daily. 05/01/13  Yes Dorothea Ogle, MD  aspirin EC 81 MG tablet Take 81 mg by mouth every morning.    Yes [provider]  atorvastatin (LIPITOR) 40 MG tablet Take 1 tablet (40 mg total) by mouth daily. 11/20/17  Yes Marykay Lex, MD  budesonide-formoterol [facility]) 160-4.5 MCG/ACT inhaler Inhale 2 puffs into the lungs 2 (two) times daily. 12/07/17  Yes Young, Joni Fears D, MD  cholecalciferol (VITAMIN D) 1000 UNITS tablet Take 1,000 Units by mouth every morning.    Yes [provider]  furosemide (LASIX) 40 MG tablet Take 1 tablet (40 mg total) daily by mouth. May take an extra 40 mg tablet daily if weight is above 213 lbs ,if needed 04/03/17  Yes Marykay Lex, MD  ipratropium-albuterol (DUONEB) 0.5-2.5 (3) MG/3ML SOLN Take 3 mLs by nebulization every 4 (four) hours as needed (wheezing and shortness of breath).    Yes [provider]  potassium chloride SA (K-DUR,KLOR-CON) 20 MEQ tablet Take 1 tablet (20 mEq total) by mouth daily. 05/07/18  Yes Marykay Lex, MD  warfarin (COUMADIN) 2.5 MG tablet TAKE ONE TO TWO TABLETS BY MOUTH AS INSTRUCTED BY COUMADIN CLINIC Patient taking differently: Take 2.5 mg by mouth See admin instructions. Take 2.5 mg by mouth in the morning on Sunday,Tuesday and Thursday. Take 2.5 mg by mouth twice  daily at 0830 and 1600 on Monday, Wednesday, Friday, and Saturday 01/30/18  Yes Marykay Lex, MD    Physical Exam: Vitals:   06/07/18 1159 06/07/18 1200 06/07/18 1230 06/07/18 1255  BP:  (!) 108/92 (!) 148/66 (!) 154/69  Pulse:  89 73 90  Resp:  20 (!) 24 (!) 22  SpO2: 93% 95% 95% 95%  Weight:      Height:       Wt Readings from Last 3 Encounters:  06/07/18 88 kg  05/18/18 92.5 kg  12/07/17 94.8 kg   Body mass index is 26.31 kg/m.  General:  Average built, in mild respiratory distress, on nasal cannula oxygen at 5 L,  hard of hearing HENT: Normocephalic, pupils equally reacting to light and accommodation.  No scleral pallor or icterus noted. Oral mucosa is moist.  Chest: Diffuse wheezing noted bilaterally with expiratory  prolongation.  Tachypneic. CVS: S1 &S2 heard. No murmur.  Regular rate and rhythm. Abdomen: Soft, nontender, nondistended.  Bowel sounds are heard.  Liver is not palpable, no abdominal mass palpated Extremities: No cyanosis, clubbing or edema.  Peripheral pulses are palpable. Psych: Alert, awake and oriented, normal mood CNS:  No cranial nerve deficits.  Power equal in all extremities.   No cerebellar signs.   Skin: Warm, venous insufficiency and lower extremities with scabs in bilateral lower extremities.  Present on admission  Labs on Admission:  Basic Metabolic Panel: Recent Labs  Lab 06/07/18 0643  NA 142  K 4.1  CL 96*  CO2 34*  GLUCOSE 102*  BUN 21  CREATININE 1.19  CALCIUM 9.0   Liver Function Tests: Recent Labs  Lab 06/07/18 0643  AST 22  ALT 18  ALKPHOS 69  BILITOT 1.2  PROT 7.2  ALBUMIN 4.4   No results for input(s): LIPASE, AMYLASE in the last 168 hours. No results for input(s): AMMONIA in the last 168 hours. CBC: Recent Labs  Lab 06/07/18 0643  WBC 7.7  NEUTROABS 5.5  HGB 13.4  HCT 45.1  MCV 94.2  PLT 131*   Cardiac Enzymes: No results for input(s): CKTOTAL, CKMB, CKMBINDEX, TROPONINI in the last 168 hours.  BNP  (last 3 results) Recent Labs    04/13/18 0833 05/15/18 2208 06/07/18 0643  BNP 141.8* 181.0* 133.1*    ProBNP (last 3 results) No results for input(s): PROBNP in the last 8760 hours.  CBG: No results for input(s): GLUCAP in the last 168 hours.   Radiological Exams on Admission: Ct Chest W Contrast  Result Date: 06/07/2018 CLINICAL DATA:  Dyspnea. Productive cough. Reported history of right lower lobectomy for hamartoma. EXAM: CT CHEST WITH CONTRAST TECHNIQUE: Multidetector CT imaging of the chest was performed during intravenous contrast administration. CONTRAST:  74mL OMNIPAQUE IOHEXOL 300 MG/ML  SOLN COMPARISON:  Chest radiograph from earlier today. 08/19/2005 chest CT. FINDINGS: Cardiovascular: Top-normal heart size. No significant pericardial effusion/thickening. Three-vessel coronary atherosclerosis status post CABG. Atherosclerotic nonaneurysmal thoracic aorta. Dilated main pulmonary artery (3.9 cm diameter), mildly increased from 3.6 cm on 2007 chest CT. No central pulmonary emboli. Mediastinum/Nodes: No discrete thyroid nodules. Unremarkable esophagus. No pathologically enlarged axillary, mediastinal or hilar lymph nodes. Lungs/Pleura: No pneumothorax. No pleural effusion. Status post right lower lobectomy. Right upper lobe 1.8 cm ground-glass pulmonary nodule (series 7/image 50), new since 08/19/2005 chest CT. Occlusion of proximal right middle lobe bronchus by nonspecific material. Patchy tree-in-bud opacities in the right middle lobe and basilar right upper lobe. Solid 7 mm basilar right lower lobe pulmonary nodule (series 7/image 91), stable since 05/20/2013 CT abdomen study, considered benign. Calcified 9 mm anterior left upper lobe granuloma. No acute consolidative airspace disease or lung masses. Mild centrilobular emphysema with diffuse bronchial wall thickening. Upper abdomen: Incomplete visualization of known infrarenal abdominal aortic aneurysm. Nonobstructing 6 mm interpolar  right renal stone. Musculoskeletal: No aggressive appearing focal osseous lesions. Intact sternotomy wires. Marked thoracic spondylosis. IMPRESSION: 1. Mild centrilobular emphysema with diffuse bronchial wall thickening, suggesting COPD. 2. Status post right lower lobectomy. Occlusion of the proximal right middle lobe bronchus by nonspecific material, probably mucoid impaction, difficult to exclude underlying endobronchial lesion. Recommend attention on follow-up chest CT versus bronchoscopic evaluation. 3. Mild patchy tree-in-bud opacities in the right middle and basilar right upper lobes, compatible with nonspecific infectious or inflammatory bronchiolitis. No acute consolidative airspace disease to suggest a pneumonia. 4. Ground-glass 1.8 cm right upper lobe pulmonary nodule,  new since 2007 chest CT. Initial follow-up with CT at 6-12 months is recommended to confirm persistence. If persistent, repeat CT is recommended every 2 years until 5 years of stability has been established. This recommendation follows the consensus statement: Guidelines for Management of Incidental Pulmonary Nodules Detected on CT Images:From the Fleischner Society 2017; published online before print (10.1148/radiol.[id number]). 5. Stable dilated main pulmonary artery, suggesting chronic pulmonary arterial hypertension. 6. Nonobstructing right nephrolithiasis. Aortic Atherosclerosis (ICD10-I70.0) and Emphysema (ICD10-J43.9). Electronically Signed   By: Delbert PhenixJason A Poff M.D.   On: 06/07/2018 10:58   Dg Chest Port 1 View  Result Date: 06/07/2018 CLINICAL DATA:  Respiratory distress EXAM: PORTABLE CHEST 1 VIEW COMPARISON:  05/19/2018 FINDINGS: Chronic elevation of the right diaphragm with costophrenic sulcus blunting on the right-consistent with scarring. Cardiomegaly after CABG. There is no edema, consolidation, effusion, or pneumothorax. IMPRESSION: Stable from prior.  No evidence of acute disease. Electronically Signed   By: Marnee SpringJonathon  Watts  M.D.   On: 06/07/2018 07:36    EKG: Personally reviewed by me which shows atrial fibrillation rate controlled  Assessment/Plan Principal Problem:   COPD with acute exacerbation (HCC) Active Problems:   Obesity (BMI 30-39.9)   CAD in native artery - LM, RCA, RI & Cx disease --> CABG x 4 (LIMA-LAD, SVG-OM/RI, SVG-rPDA-rPL)   Paroxysmal atrial fibrillation (HCC); CHA2DS2-VASc Score = 4. On Warfarin   Dyslipidemia, goal LDL below 70   Essential hypertension   Chronic renal failure syndrome, stage 3 (moderate) (HCC)   Respiratory failure (HCC)   Acute COPD exacerbation.  We will put the patient on oxygen, nebulizers.  Consider IV solumedrol for now. Will put on empiric doxycycline for possible acute bronchitis..Monitor pulse oximetry. Pulmonary hygiene therapy  Acute on chronic hypoxic respiratory failure secondary to COPD exacerbation.  Patient uses 3 to 4 L of oxygen at baseline at home.  Chronic diastolic congestive heart failure.  Monitor for arrythmias. Continue the patient on oral Lasix we will put the patient on fluid restriction, strict intake and output charting.  Daily weights.    We will closely monitor renal function on diuretics.  2 D Echocardiogram from 01/18/2017 with last known ejection fraction of 60 to 65%.Resume home medications  Hypertension.  Will closely monitor.  Resume outpatient medication.  Continue amlodipine   Hyperlipidemia. Continue Statins.   Atrial fibrillation, rate controlled will closely monitor in telemetry. Trend troponins.  Resume warfarin for anticoagulation check 2 D echocardiogram.  Consultant: None  Code Status: DNR  DVT Prophylaxis: Warfarin  Antibiotics: Doxycycline  Family Communication:  Patients' condition and plan of care including tests being ordered have been discussed with the patient  who indicate understanding and agree with the plan.  Disposition Plan: Home  Severity of Illness: The appropriate patient status for this patient  is OBSERVATION. Observation status is judged to be reasonable and necessary in order to provide the required intensity of service to ensure the patient's safety. The patient's presenting symptoms, physical exam findings, and initial radiographic and laboratory data in the context of their medical condition is felt to place them at decreased risk for further clinical deterioration. Furthermore, it is anticipated that the patient will be medically stable for discharge from the hospital within 2 midnights of admission.    Signed, Joycelyn DasLaxman Carold Eisner, MD [facility] 06/07/2018

## 2018-06-07 NOTE — Plan of Care (Signed)
  Problem: Education: Goal: Knowledge of disease or condition will improve Outcome: Progressing   Problem: Education: Goal: Knowledge of the prescribed therapeutic regimen will improve Outcome: Progressing   Problem: Activity: Goal: Ability to tolerate increased activity will improve Outcome: Progressing   Problem: Activity: Goal: Will verbalize the importance of balancing activity with adequate rest periods Outcome: Progressing   Problem: Respiratory: Goal: Ability to maintain a clear airway will improve Outcome: Progressing   Problem: Respiratory: Goal: Levels of oxygenation will improve Outcome: Progressing   Problem: Respiratory: Goal: Ability to maintain adequate ventilation will improve Outcome: Progressing   

## 2018-06-07 NOTE — ED Provider Notes (Signed)
[facility]-EMERGENCY DEPT Provider Note   CSN: [id number] Arrival date & time: 06/07/18  0609   History   Chief Complaint Chief Complaint  Patient presents with  . Shortness of Breath    HPI Roberto Reilly is a 85 y.o. male with past medical history significant for COPD, MI, atrial fibrillation, CABG x4 CHF who presents for evaluation of shortness of breath.  Symptom onset yesterday evening.  Has been using home nebulizer treatments without relief of symptoms.  Patient on 3-4 L oxygen via nasal cannula for chronic hypoxia.  Patient was recently admitted to the hospitalist service for pneumonia and hypoxia approximately 3 weeks ago.  Patient states he began coughing dark green sputum approximately 3 days ago.  Symptoms have been worsening.  Has not followed up with PCP or pulmonology for his symptoms.  Denies fever, chills, nausea, vomiting, chest pain, abdominal pain, diarrhea or dysuria.  Has been using home nebulizers without relief of symptoms.  Denies additional aggravating or alleviating factors.  Patient states he was not able to catch his breath at home and called EMS.  History provided by patient.  No interpreter was used. HPI  Past Medical History:  Diagnosis Date  . AAA (abdominal aortic aneurysm) (HCC)    4.2 X 4.8 cm 04/2013 CT; declined re-eval 02/25/15  . Anxiety   . CAD in native artery March 2007   Cath for dyspnea on exertion: 75% distal LM, 85% RI, 95% mid-distal Cx, in multiple RCA lesions with 95% distal. --> Referred for CABG; has declined further noninvasive evaluation in the absence of worsening symptoms  . COPD (chronic obstructive pulmonary disease) with emphysema (HCC) 05/23/2007   Hosp 5/29-6/01/12- COPD exacerbation ONOX 12/08/10- desaturated to less than 88% for over an hour, qualifying for home O2 during sleep    . Diverticulosis of colon with hemorrhage 05/24/2013  . Dyslipidemia, goal LDL below 70   . History of non-ST Elevation MI  (myocardial infarction) March 2007   Admitted with COPD exacerbation, given by CHF. Had some chest pain with positive troponins. Cath showed multivessel disease, referred for CABG  . History of pneumonia   . Hypertension   . Hypoxia     chronic, on home O2  . Obesity (BMI 30-39.9)    One year ago weighed 265 pounds, (12/2012) -- now 234 pounds  . PAF (paroxysmal atrial fibrillation) (HCC)    Anticoagulated on warfarin. Stable -- post op  . Pneumonia    hx  . 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 Seasonal allergies   . Shortness of breath dyspnea     Patient Active Problem List   Diagnosis Date Noted  . Acute exacerbation of chronic obstructive pulmonary disease (COPD) (HCC) 06/07/2018  . Community acquired pneumonia 05/16/2018  . Counseling regarding end of life decision making 09/22/2017  . Acute respiratory failure with hypoxia (HCC) 02/21/2017  . Acute on chronic congestive heart failure (HCC) 02/21/2017  . Acute respiratory failure (HCC) 02/21/2017  . Chronic diastolic CHF (congestive heart failure), NYHA class 2 (HCC) 01/17/2017  . Acute bronchitis due to respiratory syncytial virus (RSV) 05/20/2016  . Respiratory failure (HCC) 05/19/2016  . Shortness of breath 05/18/2016  . Chronic renal failure syndrome, stage 3 (moderate) (HCC) 05/18/2016  . Hypercapnia 05/18/2016  . Hyperglycemia   . Chronic osteomyelitis of right foot (HCC) 02/05/2016  . Acute and chronic respiratory failure with hypoxia (HCC)   . Abnormal TSH 11/03/2015  .  Pleural effusion 11/01/2015  . Acute renal failure (ARF) (HCC) 11/01/2015  . Sepsis (HCC) 11/01/2015  . Pneumonia 11/01/2015  . COPD exacerbation (HCC) 11/01/2015  . Encounter for long-term (current) use of other medications 01/07/2014  . Nasal sinus congestion 01/07/2014  . Diverticulosis of colon with hemorrhage 05/24/2013  . Internal hemorrhoids 05/24/2013  . AAA (abdominal aortic aneurysm) without rupture seen on  CT scan 05/22/2013  . Rectal bleeding 05/21/2013  . Anemia 05/20/2013  . Generalized anxiety disorder 05/09/2013  . Auditory hallucination 05/09/2013  . COPD mixed type (HCC) 05/07/2013  . COPD with acute exacerbation (HCC) 05/07/2013  . Chest discomfort 05/07/2013  . Hx of scabies 04/29/2013  . Essential hypertension 04/29/2013  . Obesity (BMI 30-39.9) 01/18/2013  . Paroxysmal atrial fibrillation (HCC); CHA2DS2-VASc Score = 4. On Warfarin   . Dyslipidemia, goal LDL below 70   . Reactive depression (situational) 09/06/2011  . CAD 05/23/2007  . CAD in native artery - LM, RCA, RI & Cx disease --> CABG x 4 (LIMA-LAD, SVG-OM/RI, SVG-rPDA-rPL) 07/21/2005    Class: History of  . S/P CABG x 4 07/21/2005    Past Surgical History:  Procedure Laterality Date  . ACHILLES TENDON LENGTHENING Right 10/08/2015   Procedure: ACHILLES TENDON LENGTHENING;  Surgeon: Toni Arthurs, MD;  Location: [facility] OR;  Service: Orthopedics;  Laterality: Right;  . AMPUTATION Right 10/08/2015   Procedure: RIGHT FOOT TRANSMET AMPUTATION;  Surgeon: Toni Arthurs, MD;  Location: [facility] OR;  Service: Orthopedics;  Laterality: Right;  . CARDIAC CATHETERIZATION  03 28 2007   NORMAL LV FUNCTION/ ABDOMINAL AORTA STENOSIS,75%-85%. RIGHT FEMORAL ARTERY :CATHETERS USED A  4-FRENCH WITH A 4-FRENCH SHEATH  . CATARACT EXTRACTION     Laser  . COLONOSCOPY N/A 05/24/2013   Procedure: COLONOSCOPY;  Surgeon: Iva Boop, MD;  Location: [facility] ENDOSCOPY;  Service: Endoscopy;  Laterality: N/A;  . CORONARY ARTERY BYPASS GRAFT    . RLL resection for Hamartoma     lungs  . RUL for hamartoma     lung  . TRANSTHORACIC ECHOCARDIOGRAM  12/2016   Normal LV size and function.  EF 60-65% - no RWMA.  Mild RA dilation        Home Medications    Prior to Admission medications   Medication Sig Start Date End Date Taking? Authorizing Provider  amLODipine (NORVASC) 10 MG tablet Take 1 tablet (10 mg total) by mouth daily. 05/01/13  Yes Dorothea Ogle, MD    aspirin EC 81 MG tablet Take 81 mg by mouth every morning.    Yes [provider]  atorvastatin (LIPITOR) 40 MG tablet Take 1 tablet (40 mg total) by mouth daily. 11/20/17  Yes Marykay Lex, MD  budesonide-formoterol [facility]) 160-4.5 MCG/ACT inhaler Inhale 2 puffs into the lungs 2 (two) times daily. 12/07/17  Yes Young, Joni Fears D, MD  cholecalciferol (VITAMIN D) 1000 UNITS tablet Take 1,000 Units by mouth every morning.    Yes [provider]  furosemide (LASIX) 40 MG tablet Take 1 tablet (40 mg total) daily by mouth. May take an extra 40 mg tablet daily if weight is above 213 lbs ,if needed 04/03/17  Yes Marykay Lex, MD  ipratropium-albuterol (DUONEB) 0.5-2.5 (3) MG/3ML SOLN Take 3 mLs by nebulization every 4 (four) hours as needed (wheezing and shortness of breath).    Yes [provider]  potassium chloride SA (K-DUR,KLOR-CON) 20 MEQ tablet Take 1 tablet (20 mEq total) by mouth daily. 05/07/18  Yes Marykay Lex, MD  warfarin (COUMADIN) 2.5 MG tablet TAKE ONE TO TWO TABLETS BY MOUTH AS INSTRUCTED BY COUMADIN CLINIC Patient taking differently: Take 2.5 mg by mouth See admin instructions. Take 2.5 mg by mouth in the morning on Sunday,Tuesday and Thursday. Take 2.5 mg by mouth twice daily at 0830 and 1600 on Monday, Wednesday, Friday, and Saturday 01/30/18  Yes Marykay Lex, MD    Family History Family History  Problem Relation Age of Onset  . Heart attack Mother   . Heart attack Father     Social History Social History   Tobacco Use  . Smoking status: Former Smoker    Packs/day: 2.00    Years: 55.00    Pack years: 110.00    Types: Cigarettes    Last attempt to quit: 05/23/1998    Years since quitting: 20.0  . Smokeless tobacco: Never Used  Substance Use Topics  . Alcohol use: No  . Drug use: No     Allergies   Patient has no known allergies.   Review of Systems Review of Systems  Constitutional: Negative.   HENT: Negative.   Eyes:  Negative.   Respiratory: Positive for cough, chest tightness, shortness of breath and wheezing. Negative for choking and stridor.   Cardiovascular: Negative.   Gastrointestinal: Negative.   Genitourinary: Negative.   Musculoskeletal: Negative.   Skin: Negative.   Neurological: Negative.   All other systems reviewed and are negative.    Physical Exam Updated Vital Signs BP (!) 147/71   Pulse 88   Resp (!) 25   Ht 6' (1.829 m)   Wt 88 kg   SpO2 98%   BMI 26.31 kg/m   Physical Exam Vitals signs and nursing note reviewed.  Constitutional:      General: He is not in acute distress.    Appearance: He is well-developed. He is not diaphoretic.  HENT:     Head: Normocephalic and atraumatic.     Mouth/Throat:     Mouth: Mucous membranes are moist.     Pharynx: Oropharynx is clear.  Eyes:     Pupils: Pupils are equal, round, and reactive to light.  Neck:     Musculoskeletal: Normal range of motion and neck supple.     Comments: No neck stiffness or neck rigidity. Cardiovascular:     Rate and Rhythm: Normal rate and regular rhythm.     Pulses: Normal pulses.     Heart sounds: Normal heart sounds.  Pulmonary:     Effort: Pulmonary effort is normal. No respiratory distress.     Comments: Diffuse course rhonchi and expiratory wheezing.  Patient with decreased breath sounds in right lung field. Tachypneic.  Refusing CPAP.  Patient is only able to speak in 2 word sentences without catching of breath.  No pursed lip breathing. Abdominal:     General: There is no distension.     Palpations: Abdomen is soft.  Musculoskeletal: Normal range of motion.  Skin:    General: Skin is warm and dry.  Neurological:     Mental Status: He is alert.      ED Treatments / Results  Labs (all labs ordered are listed, but only abnormal results are displayed) Labs Reviewed  CBC WITH DIFFERENTIAL/PLATELET - Abnormal; Notable for the following components:      Result Value   MCHC 29.7 (*)     Platelets 131 (*)    All other components within normal limits  COMPREHENSIVE METABOLIC PANEL - Abnormal; Notable for the following components:  Chloride 96 (*)    CO2 34 (*)    Glucose, Bld 102 (*)    GFR calc non Af Amer 57 (*)    All other components within normal limits  BRAIN NATRIURETIC PEPTIDE - Abnormal; Notable for the following components:   B Natriuretic Peptide 133.1 (*)    All other components within normal limits  URINALYSIS, ROUTINE W REFLEX MICROSCOPIC - Abnormal; Notable for the following components:   Specific Gravity, Urine >1.046 (*)    Glucose, UA 50 (*)    All other components within normal limits  PROTIME-INR - Abnormal; Notable for the following components:   Prothrombin Time 30.5 (*)    All other components within normal limits  I-STAT TROPONIN, ED  I-STAT CG4 LACTIC ACID, ED    EKG EKG Interpretation  Date/Time:  Thursday June 07 2018 08:08:43 EST Ventricular Rate:  70 PR Interval:    QRS Duration: 152 QT Interval:  421 QTC Calculation: 455 R Axis:   56 Text Interpretation:  Atrial fibrillation Right bundle branch block similar to prior 12/19 Confirmed by Meridee ScoreButler, Michael [phone]([id number]) on 06/07/2018 8:12:27 AM Also confirmed by Meridee ScoreButler, Michael [phone]), editor Marina del ReySimpson, Tamera PuntMiranda ([phone])  on 06/07/2018 9:15:26 AM   Radiology Ct Chest W Contrast  Result Date: 06/07/2018 CLINICAL DATA:  Dyspnea. Productive cough. Reported history of right lower lobectomy for hamartoma. EXAM: CT CHEST WITH CONTRAST TECHNIQUE: Multidetector CT imaging of the chest was performed during intravenous contrast administration. CONTRAST:  75mL OMNIPAQUE IOHEXOL 300 MG/ML  SOLN COMPARISON:  Chest radiograph from earlier today. 08/19/2005 chest CT. FINDINGS: Cardiovascular: Top-normal heart size. No significant pericardial effusion/thickening. Three-vessel coronary atherosclerosis status post CABG. Atherosclerotic nonaneurysmal thoracic aorta. Dilated main pulmonary artery (3.9 cm diameter),  mildly increased from 3.6 cm on 2007 chest CT. No central pulmonary emboli. Mediastinum/Nodes: No discrete thyroid nodules. Unremarkable esophagus. No pathologically enlarged axillary, mediastinal or hilar lymph nodes. Lungs/Pleura: No pneumothorax. No pleural effusion. Status post right lower lobectomy. Right upper lobe 1.8 cm ground-glass pulmonary nodule (series 7/image 50), new since 08/19/2005 chest CT. Occlusion of proximal right middle lobe bronchus by nonspecific material. Patchy tree-in-bud opacities in the right middle lobe and basilar right upper lobe. Solid 7 mm basilar right lower lobe pulmonary nodule (series 7/image 91), stable since 05/20/2013 CT abdomen study, considered benign. Calcified 9 mm anterior left upper lobe granuloma. No acute consolidative airspace disease or lung masses. Mild centrilobular emphysema with diffuse bronchial wall thickening. Upper abdomen: Incomplete visualization of known infrarenal abdominal aortic aneurysm. Nonobstructing 6 mm interpolar right renal stone. Musculoskeletal: No aggressive appearing focal osseous lesions. Intact sternotomy wires. Marked thoracic spondylosis. IMPRESSION: 1. Mild centrilobular emphysema with diffuse bronchial wall thickening, suggesting COPD. 2. Status post right lower lobectomy. Occlusion of the proximal right middle lobe bronchus by nonspecific material, probably mucoid impaction, difficult to exclude underlying endobronchial lesion. Recommend attention on follow-up chest CT versus bronchoscopic evaluation. 3. Mild patchy tree-in-bud opacities in the right middle and basilar right upper lobes, compatible with nonspecific infectious or inflammatory bronchiolitis. No acute consolidative airspace disease to suggest a pneumonia. 4. Ground-glass 1.8 cm right upper lobe pulmonary nodule, new since 2007 chest CT. Initial follow-up with CT at 6-12 months is recommended to confirm persistence. If persistent, repeat CT is recommended every 2 years  until 5 years of stability has been established. This recommendation follows the consensus statement: Guidelines for Management of Incidental Pulmonary Nodules Detected on CT Images:From the Fleischner Society 2017; published online before print (10.1148/radiol.[id number]). 5. Stable dilated  main pulmonary artery, suggesting chronic pulmonary arterial hypertension. 6. Nonobstructing right nephrolithiasis. Aortic Atherosclerosis (ICD10-I70.0) and Emphysema (ICD10-J43.9). Electronically Signed   By: Delbert Phenix M.D.   On: 06/07/2018 10:58   Dg Chest Port 1 View  Result Date: 06/07/2018 CLINICAL DATA:  Respiratory distress EXAM: PORTABLE CHEST 1 VIEW COMPARISON:  05/19/2018 FINDINGS: Chronic elevation of the right diaphragm with costophrenic sulcus blunting on the right-consistent with scarring. Cardiomegaly after CABG. There is no edema, consolidation, effusion, or pneumothorax. IMPRESSION: Stable from prior.  No evidence of acute disease. Electronically Signed   By: Marnee Spring M.D.   On: 06/07/2018 07:36    Procedures Procedures (including critical care time)  Medications Ordered in ED Medications  warfarin (COUMADIN) tablet 2.5 mg (has no administration in time range)  Warfarin - Pharmacist Dosing Inpatient (has no administration in time range)  methylPREDNISolone sodium succinate (SOLU-MEDROL) 125 mg/2 mL injection 125 mg (125 mg Intravenous Given 06/07/18 0654)  ipratropium-albuterol (DUONEB) 0.5-2.5 (3) MG/3ML nebulizer solution 3 mL (3 mLs Nebulization Given 06/07/18 0657)  iohexol (OMNIPAQUE) 300 MG/ML solution 75 mL (75 mLs Intravenous Contrast Given 06/07/18 1021)  sodium chloride (PF) 0.9 % injection (  Given by Other 06/07/18 1021)  ipratropium-albuterol (DUONEB) 0.5-2.5 (3) MG/3ML nebulizer solution 3 mL (3 mLs Nebulization Given 06/07/18 1159)     Initial Impression / Assessment and Plan / ED Course  I have reviewed the triage vital signs and the nursing notes.  Pertinent labs &  imaging results that were available during my care of the patient were reviewed by me and considered in my medical decision making (see chart for details).  85 year old male resents for shortness of breath.  Onset yesterday evening is been worsening.  Patient on 3-4 L nasal cannula at baseline at home.  History of COPD, right thoracotomy, A. fib, CHF.  Patient recently hospitalized 4 weeks ago for pneumonia and hypoxia.  Patient tachypneic on exam.  Currently on 5 L oxygen with oxygen saturations at 90%.  Patient with increased work of breathing. Refusing CPAP. Patient refused IV steroids and treatments by EMS.  Patient with diffuse rhonchi, wheezing, decreased lung sounds on right.  Concern for pneumonia vs COPD exacerbation.  Will obtain labs, imaging and repeat. Patient likely will require admission.  0730: Patient patient with decreased work of breathing after steroids and duoneb.  Wheezing improved. Oxygen saturation 94% on 5 L via nasal cannula.  Patient does not want additional breathing treatments at this time.  Lactic acid 1.75, CBC without leukocytosis, metabolic panel without significant abnormality, PT/INR 30.5, patient currently on anticoagulation for atrial fibrillation.  Chest x-ray EKG pending.  Will reevaluate. Patient refusing nursing to check temperature.  0830: Patient with continued rhonchi on exam.  No wheezing.  Patient does not want any additional treatment at this time.  Patient currently on 5 L of oxygen, increased from his baseline of 3-4 L.  Will decrease patient oxygen to his home 4 L and reevaluate.  Patient states he would like to go home and not be admitted to the hospital if possible. BNP 133. Chest xray without acute findings.WIll order CT given continued rhonchi on exam.  1100: CT scan with bronchiolitis, no over inflitrates. Patient with continued rhonchi and now increasing expiratory wheezing. Patient is tachypnic and only able to speak in 2-3 word sentences. Will give  additional duoneb and reevaluate.  1230: Patient with continued rhonchi and wheeze after second duoneb. Patient refusing 1 hr continuous nebulizer's and magnesium. Patient with increased  oxygen demand at 5L with no ambulation.  I have attempted to sit patient on edge of bed with oxygen saturations dropping to 86%.  She is not safe to DC home at this time.  I discussed this with patient.  Patient continues to refuse 1 hour continuous nebulizer, however will agree to an additional DuoNeb treatment.  Given increase in oxygen demand will consult with hospitalist for admission.  I have consulted with Dr. Tyson BabinskiPokhrel with [facility] who agrees for admission for continued management of patient.  Patient has been seen by my attending, Dr. Charm BargesButler who agrees with the treatment, plan and disposition.  Clinical Course as of Jun 07 1457  Thu Jun 07, 2018  [id number] 85 year old male with history of COPD on nasal cannula oxygen and recent admission for pneumonia over Christmas here with 1-1/2 weeks of increased shortness of breath.  Said he is coughing up some green sputum.  Does not think he has a fever.  Is got some coarse rhonchi on exam.  He is getting blood work and a chest x-ray.  Likely admission.   [MB]    Clinical Course User Index [MB] Terrilee FilesButler, Michael C, MD   Final Clinical Impressions(s) / ED Diagnoses   Final diagnoses:  COPD exacerbation [facility](HCC)  Longstanding persistent atrial fibrillation    ED Discharge Orders    None       Annalia Metzger A, PA-C 06/07/18 1508    Terrilee FilesButler, Michael C, MD 06/07/18 1746

## 2018-06-07 NOTE — ED Notes (Signed)
Pt is currently talking on the phone, refusing to let writer capture EKG and give a breathing treatment at this time.

## 2018-06-07 NOTE — ED Notes (Signed)
Bed: WA21 Expected date:  Expected time:  Means of arrival:  Comments: 85 yo M/ shortness of breath

## 2018-06-07 NOTE — ED Triage Notes (Signed)
Pt arrived by [facility] from home due to respiratory distress. Per EMS, pt states he "can not catch his breath." Pt states to EMS that he has been coughing up green mucous for the past month. Pt has been self treating with Duoneb's since 1400. Per EMS, rhonci in lower lobes and wheezing heard all over. Pt refused IV treatment and a CPAP from EMS. Pt also has a hx of afib.

## 2018-06-07 NOTE — ED Notes (Signed)
Patient given urinal to provide urine sample.  

## 2018-06-08 ENCOUNTER — Observation Stay (HOSPITAL_COMMUNITY): Payer: Medicare Other

## 2018-06-08 DIAGNOSIS — J432 Centrilobular emphysema: Secondary | ICD-10-CM | POA: Diagnosis present

## 2018-06-08 DIAGNOSIS — Z6826 Body mass index (BMI) 26.0-26.9, adult: Secondary | ICD-10-CM | POA: Diagnosis not present

## 2018-06-08 DIAGNOSIS — F419 Anxiety disorder, unspecified: Secondary | ICD-10-CM | POA: Diagnosis present

## 2018-06-08 DIAGNOSIS — Z951 Presence of aortocoronary bypass graft: Secondary | ICD-10-CM | POA: Diagnosis not present

## 2018-06-08 DIAGNOSIS — J441 Chronic obstructive pulmonary disease with (acute) exacerbation: Secondary | ICD-10-CM | POA: Diagnosis not present

## 2018-06-08 DIAGNOSIS — I714 Abdominal aortic aneurysm, without rupture: Secondary | ICD-10-CM | POA: Diagnosis present

## 2018-06-08 DIAGNOSIS — I252 Old myocardial infarction: Secondary | ICD-10-CM | POA: Diagnosis not present

## 2018-06-08 DIAGNOSIS — I5032 Chronic diastolic (congestive) heart failure: Secondary | ICD-10-CM | POA: Diagnosis present

## 2018-06-08 DIAGNOSIS — H9190 Unspecified hearing loss, unspecified ear: Secondary | ICD-10-CM | POA: Diagnosis present

## 2018-06-08 DIAGNOSIS — R0602 Shortness of breath: Secondary | ICD-10-CM

## 2018-06-08 DIAGNOSIS — R791 Abnormal coagulation profile: Secondary | ICD-10-CM | POA: Diagnosis present

## 2018-06-08 DIAGNOSIS — Z7982 Long term (current) use of aspirin: Secondary | ICD-10-CM | POA: Diagnosis not present

## 2018-06-08 DIAGNOSIS — Z7901 Long term (current) use of anticoagulants: Secondary | ICD-10-CM | POA: Diagnosis not present

## 2018-06-08 DIAGNOSIS — R05 Cough: Secondary | ICD-10-CM | POA: Diagnosis not present

## 2018-06-08 DIAGNOSIS — Z5329 Procedure and treatment not carried out because of patient's decision for other reasons: Secondary | ICD-10-CM | POA: Diagnosis present

## 2018-06-08 DIAGNOSIS — I130 Hypertensive heart and chronic kidney disease with heart failure and stage 1 through stage 4 chronic kidney disease, or unspecified chronic kidney disease: Secondary | ICD-10-CM | POA: Diagnosis present

## 2018-06-08 DIAGNOSIS — Z8249 Family history of ischemic heart disease and other diseases of the circulatory system: Secondary | ICD-10-CM | POA: Diagnosis not present

## 2018-06-08 DIAGNOSIS — Z7951 Long term (current) use of inhaled steroids: Secondary | ICD-10-CM | POA: Diagnosis not present

## 2018-06-08 DIAGNOSIS — Z9981 Dependence on supplemental oxygen: Secondary | ICD-10-CM | POA: Diagnosis not present

## 2018-06-08 DIAGNOSIS — Z87891 Personal history of nicotine dependence: Secondary | ICD-10-CM | POA: Diagnosis not present

## 2018-06-08 DIAGNOSIS — I4811 Longstanding persistent atrial fibrillation: Secondary | ICD-10-CM | POA: Diagnosis present

## 2018-06-08 DIAGNOSIS — Z79899 Other long term (current) drug therapy: Secondary | ICD-10-CM | POA: Diagnosis not present

## 2018-06-08 DIAGNOSIS — E785 Hyperlipidemia, unspecified: Secondary | ICD-10-CM | POA: Diagnosis present

## 2018-06-08 DIAGNOSIS — J9621 Acute and chronic respiratory failure with hypoxia: Secondary | ICD-10-CM | POA: Diagnosis present

## 2018-06-08 DIAGNOSIS — Z89431 Acquired absence of right foot: Secondary | ICD-10-CM | POA: Diagnosis not present

## 2018-06-08 DIAGNOSIS — I2510 Atherosclerotic heart disease of native coronary artery without angina pectoris: Secondary | ICD-10-CM | POA: Diagnosis present

## 2018-06-08 DIAGNOSIS — E669 Obesity, unspecified: Secondary | ICD-10-CM | POA: Diagnosis present

## 2018-06-08 LAB — CBC
HCT: 40.2 % (ref 39.0–52.0)
Hemoglobin: 12.3 g/dL — ABNORMAL LOW (ref 13.0–17.0)
MCH: 28.5 pg (ref 26.0–34.0)
MCHC: 30.6 g/dL (ref 30.0–36.0)
MCV: 93.1 fL (ref 80.0–100.0)
Platelets: 141 10*3/uL — ABNORMAL LOW (ref 150–400)
RBC: 4.32 MIL/uL (ref 4.22–5.81)
RDW: 14.5 % (ref 11.5–15.5)
WBC: 9.2 10*3/uL (ref 4.0–10.5)
nRBC: 0 % (ref 0.0–0.2)

## 2018-06-08 LAB — PROTIME-INR
INR: 2.76
Prothrombin Time: 28.8 seconds — ABNORMAL HIGH (ref 11.4–15.2)

## 2018-06-08 LAB — ECHOCARDIOGRAM COMPLETE
Height: 72 in
Weight: 3136 oz

## 2018-06-08 LAB — BASIC METABOLIC PANEL
Anion gap: 12 (ref 5–15)
BUN: 27 mg/dL — ABNORMAL HIGH (ref 8–23)
CO2: 30 mmol/L (ref 22–32)
CREATININE: 1 mg/dL (ref 0.61–1.24)
Calcium: 8.7 mg/dL — ABNORMAL LOW (ref 8.9–10.3)
Chloride: 96 mmol/L — ABNORMAL LOW (ref 98–111)
GFR calc Af Amer: >60 mL/min (ref >60)
GFR calc non Af Amer: >60 mL/min (ref >60)
GLUCOSE: 128 mg/dL — AB (ref 70–99)
Potassium: 4.5 mmol/L (ref 3.5–5.1)
Sodium: 138 mmol/L (ref 135–145)

## 2018-06-08 LAB — TROPONIN I: Troponin I: <0.03 ng/mL (ref <0.03)

## 2018-06-08 MED ORDER — WARFARIN SODIUM 5 MG PO TABS
5.0000 mg | ORAL_TABLET | Freq: Once | ORAL | Status: AC
  Administered 2018-06-08: 5 mg via ORAL
  Filled 2018-06-08: qty 1

## 2018-06-08 MED ORDER — PERFLUTREN LIPID MICROSPHERE
1.0000 mL | INTRAVENOUS | Status: AC | PRN
  Administered 2018-06-08: 5 mL via INTRAVENOUS
  Filled 2018-06-08: qty 10

## 2018-06-08 NOTE — Progress Notes (Signed)
ANTICOAGULATION CONSULT NOTE  Pharmacy Consult for warfarin Indication: atrial fibrillation  No Known Allergies  Patient Measurements: Height: 6' (182.9 cm) Weight: 196 lb (88.9 kg) IBW/kg (Calculated) : 77.6  Vital Signs: Temp: 97.9 F (36.6 C) (01/17 0448) Temp Source: Oral (01/17 0448) BP: 125/69 (01/17 0448) Pulse Rate: 90 (01/17 0919)  Labs: Recent Labs    06/07/18 0643 06/07/18 1939 06/08/18 0701  HGB 13.4  --  12.3*  HCT 45.1  --  40.2  PLT 131*  --  141*  LABPROT 30.5*  --  28.8*  INR 2.97  --  2.76  CREATININE 1.19  --  1.00  TROPONINI  --  <0.03 <0.03    Estimated Creatinine Clearance: 62.5 mL/min (by C-G formula based on SCr of 1 mg/dL).   Medical History: Past Medical History:  Diagnosis Date  . AAA (abdominal aortic aneurysm) (HCC)    4.2 X 4.8 cm 04/2013 CT; declined re-eval 02/25/15  . Anxiety   . CAD in native artery March 2007   Cath for dyspnea on exertion: 75% distal LM, 85% RI, 95% mid-distal Cx, in multiple RCA lesions with 95% distal. --> Referred for CABG; has declined further noninvasive evaluation in the absence of worsening symptoms  . COPD (chronic obstructive pulmonary disease) with emphysema (HCC) 05/23/2007   Hosp 5/29-6/01/12- COPD exacerbation ONOX 12/08/10- desaturated to less than 88% for over an hour, qualifying for home O2 during sleep    . Diverticulosis of colon with hemorrhage 05/24/2013  . Dyslipidemia, goal LDL below 70   . History of non-ST Elevation MI (myocardial infarction) March 2007   Admitted with COPD exacerbation, given by CHF. Had some chest pain with positive troponins. Cath showed multivessel disease, referred for CABG  . History of pneumonia   . Hypertension   . Hypoxia     chronic, on home O2  . Obesity (BMI 30-39.9)    One year ago weighed 265 pounds, (12/2012) -- now 234 pounds  . PAF (paroxysmal atrial fibrillation) (HCC)    Anticoagulated on warfarin. Stable -- post op  . Pneumonia    hx  . 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 Seasonal allergies   . Shortness of breath dyspnea     Medications:  Warfarin 2.5 mg bid M/W/F/Sa and 2.5 mg daily Tu/Th/Su  Assessment: 85 y/o M with a h/o PAF on warfarin and recent h/o hospitalization for PNA admitted with AECOPD. Pharmacy consulted to continue dosing warfarin inpatient.   Verified with the patient that he does in fact take one 2.5 mg tablet on Tu/Th/Su and two 2.5 mg tablets on M/W/F/Sa (1 in the morning and 1 in the afternoon). Patient taking ASA 81 mg chronically given hx of CABG x4.  06/08/18 9:47 AM  - INR therapeutic at 2.76 - Hgb 12.3, Plt 141, Scr 1.0 - No s/sx of bleeding - Heart healthy diet w/ fair appetite, no meals documented - Initiated doxycycline which may affect sensitivity to warfarin.   Goal of Therapy:   INR 2-3   Plan:  - Warfarin 5 mg once today (give in one dose at 1800) - Daily INR - Monitor for s/s bleeding  Roslyn Smilingraig M Brison Fiumara  PharmD Candidate 06/08/2018,9:47 AM

## 2018-06-08 NOTE — Care Management Note (Signed)
Case Management Note  Patient Details  Name: Roberto Reilly MRN: [id number] Date of Birth: [birth year]  Subjective/Objective:COPD. From home alone-has dtr that comes every Saturday-does shopping,adl's,cooks,errands. Has home 02-[employer]-has travel tank. PT recc-HHPT-patient pleasantly declines. Patient does want a rollator. [facility] notified-rep Jermaine aware to deliver to rm prior d/c.MD informed.                    Action/Plan:d/c home w/dme-rollator.   Expected Discharge Date:  (unknown)               Expected Discharge Plan:  Home/Self Care  In-House Referral:     Discharge planning Services  CM Consult  Post Acute Care Choice:  Durable Medical Equipment([employer]-home 02 has travel tank) Choice offered to:     DME Arranged:    DME Agency:     HH Arranged:  Patient Refused HH HH Agency:     Status of Service:  In process, will continue to follow  If discussed at Long Length of Stay Meetings, dates discussed:    Additional Comments:  Lanier Clam, RN 06/08/2018, 3:32 PM

## 2018-06-08 NOTE — Progress Notes (Signed)
Patient neb treatment scheduled changed to 1000/1600/2200/0400 per patient preference.

## 2018-06-08 NOTE — Progress Notes (Signed)
  Echocardiogram 2D Echocardiogram has been performed.  Gerda Diss 06/08/2018, 2:01 PM

## 2018-06-08 NOTE — Evaluation (Signed)
Physical Therapy Evaluation Patient Details Name: Roberto Reilly MRN: [id number] DOB: 06/02/1933 Today's Date: 06/08/2018   History of Present Illness  85 y.o. male with past medical history of COPD, myocardial infarction, atrial fibrillation, CABG x4, congestive heart failure  and admitted for acute COPD exacerbation  Clinical Impression  Pt admitted with above diagnosis. Pt currently with functional limitations due to the deficits listed below (see PT Problem List).  Pt assisted with ambulating in hallway and SpO2 dropped to 85% on 4L O2. Pt will benefit from skilled PT to increase their independence and safety with mobility to allow discharge to the venue listed below.       Follow Up Recommendations Home health PT    Equipment Recommendations  None recommended by PT    Recommendations for Other Services       Precautions / Restrictions Precautions Precautions: Fall Precaution Comments: monitor O2, on 4L O2 at home      Mobility  Bed Mobility               General bed mobility comments: NT- up in recliner  Transfers Overall transfer level: Needs assistance Equipment used: None Transfers: Sit to/from Stand Sit to Stand: Supervision         General transfer comment: supervision for safety  Ambulation/Gait Ambulation/Gait assistance: Min guard Gait Distance (Feet): 120 Feet Assistive device: None Gait Pattern/deviations: Step-through pattern     General Gait Details: pt utilized hand rail at times however no overt LOB observed, pt also pushed O2 tank, SPO2 dropped to 85% on 4L O2 and improved to 90% after 4 minutes of seated rest upon return to recliner  Stairs            Wheelchair Mobility    Modified Rankin (Stroke Patients Only)       Balance Overall balance assessment: Needs assistance         Standing balance support: No upper extremity supported Standing balance-Leahy Scale: Fair Standing balance comment: static balance appears fair  however requires UE support for challenges                             Pertinent Vitals/Pain Pain Assessment: No/denies pain    Home Living Family/patient expects to be discharged to:: Private residence Living Arrangements: Alone Available Help at Discharge: Family;Available PRN/intermittently Type of Home: Apartment Home Access: Level entry     Home Layout: One level Home Equipment: Cane - single point;Walker - 2 wheels Additional Comments: daughter comes on Saturdays to assist with groceries, etc.    Prior Function Level of Independence: Independent with assistive device(s)         Comments: uses cane or RW in community but states he doesn't leave home very often, reports furniture walking at home     Hand Dominance        Extremity/Trunk Assessment        Lower Extremity Assessment Lower Extremity Assessment: Overall WFL for tasks assessed       Communication   Communication: HOH  Cognition Arousal/Alertness: Awake/alert Behavior During Therapy: WFL for tasks assessed/performed Overall Cognitive Status: Within Functional Limits for tasks assessed                                        General Comments      Exercises     Assessment/Plan  PT Assessment Patient needs continued PT services  PT Problem List Decreased strength;Decreased mobility;Decreased activity tolerance;Decreased balance;Decreased knowledge of use of DME;Cardiopulmonary status limiting activity       PT Treatment Interventions Functional mobility training;Patient/family education;Therapeutic exercise;Gait training;DME instruction;Balance training    PT Goals (Current goals can be found in the Care Plan section)  Acute Rehab PT Goals PT Goal Formulation: With patient Time For Goal Achievement: 06/22/18 Potential to Achieve Goals: Fair    Frequency Min 3X/week   Barriers to discharge        Co-evaluation               AM-PAC PT "6  Clicks" Mobility  Outcome Measure Help needed turning from your back to your side while in a flat bed without using bedrails?: A Little Help needed moving from lying on your back to sitting on the side of a flat bed without using bedrails?: A Little Help needed moving to and from a bed to a chair (including a wheelchair)?: A Little Help needed standing up from a chair using your arms (e.g., wheelchair or bedside chair)?: A Little Help needed to walk in hospital room?: A Little Help needed climbing 3-5 steps with a railing? : A Little 6 Click Score: 18    End of Session Equipment Utilized During Treatment: Oxygen;Gait belt Activity Tolerance: Patient tolerated treatment well Patient left: in chair;with call bell/phone within reach;with chair alarm set   PT Visit Diagnosis: Difficulty in walking, not elsewhere classified (R26.2)    Time: [id number] PT Time Calculation (min) (ACUTE ONLY): 15 min   Charges:   PT Evaluation $PT Eval Low Complexity: 1 Low         Zenovia JarredKati Mariam Helbert, PT, DPT Acute Rehabilitation Services Office: [phone] Pager: [phone]   Sarajane JewsLEMYRE,KATHrine E 06/08/2018, 12:38 PM

## 2018-06-08 NOTE — Progress Notes (Signed)
PROGRESS NOTE    Roberto ChristmasJames Swickard  QIO:[id number] DOB: 12/09/1933 DOA: 06/07/2018 PCP: Pearson GrippeKim, Pepe, MD  Brief Narrative:85 y.o. male with past medical history of COPD, myocardial infarction, atrial fibrillation, CABG x4, history of congestive heart failure presented to hospital with shortness of breath cough and wheezing since yesterday.  Patient has been using his home nebulizer treatment without any relief.  At baseline patient uses 3 to 4 L of oxygen for chronic hypoxia.  Patient was recently admitted to the hospital almost 3 weeks back for pneumonia.  Patient states that he has been having productive sputum greenish in color for the last 2 to 3 days.  He however denies any fever, chills or rigor.  Denies any nausea, vomiting or abdominal pain.  Patient denies any chest pain, palpitation, dizziness or lightheadedness.  Patient denies urinary urgency, frequency or dysuria.  ED Course: In the ED, patient received nebulizer and IV Solu-Medrol.  He declined a 1 hour treatment of albuterol and was extremely wheezy despite his initial treatment.  He also had difficulty ambulating and required 5 L of oxygen in the ED with increased work of breathing, so the hospitalist team was consulted for observation in the hospital.  Patient however refused CPAP placement in the ED.  CT scan of the chest was done in the ED which noted evidence of emphysema with right lower lobectomy, patchy tree-in-bud opacities in the right middle and basilar right upper lobes.  Assessment & Plan:   Principal Problem:   COPD with acute exacerbation (HCC) Active Problems:   Obesity (BMI 30-39.9)   CAD in native artery - LM, RCA, RI & Cx disease --> CABG x 4 (LIMA-LAD, SVG-OM/RI, SVG-rPDA-rPL)   Paroxysmal atrial fibrillation (HCC); CHA2DS2-VASc Score = 4. On Warfarin   Dyslipidemia, goal LDL below 70   Essential hypertension   Chronic renal failure syndrome, stage 3 (moderate) (HCC)   Respiratory failure (HCC)   Acute exacerbation  of chronic obstructive pulmonary disease (COPD) (HCC)  Acute COPD exacerbation-patient presented with increasing shortness of breath cough with green phlegm.  Still with diffuse wheezing continue IV steroids.  Continue empiric doxycycline.  Continue nebulizer treatments.   At baseline he uses 3 to 4 L of oxygen at home.  Out of bed ambulate PT consult  Chronic diastolic congestive heart failure-stable continue home dose of Lasix.   Hypertension.  Will closely monitor.  Resume outpatient medication.  Continue amlodipine   Hyperlipidemia. Continue Statins.   Atrial fibrillation, rate controlled will closely monitor in telemetry.   Resume warfarin for anticoagulation check 2 D echocardiogram.   Severity patient still requiring IV steroids and has diffuse bilateral wheezing.  Estimated body mass index is 26.58 kg/m as calculated from the following:   Height as of this encounter: 6' (1.829 m).   Weight as of this encounter: 88.9 kg.  DVT prophylaxis: Coumadin Code Status: Full code Family Communication: None Disposition Plan: Pending clinical improvement   Consultants: None    Procedures: None Antimicrobials:  Doxycycline Subjective: Patient is sitting up in chair on 4 L of oxygen  Objective: Vitals:   06/07/18 2254 06/08/18 0448 06/08/18 0500 06/08/18 0919  BP:  125/69    Pulse:  72  90  Resp:  (!) 24  18  Temp:  97.9 F (36.6 C)    TempSrc:  Oral    SpO2: 98% (!) 84%  95%  Weight:   88.9 kg   Height:       No intake or output data  in the 24 hours ending 06/08/18 1301 Filed Weights   06/07/18 0624 06/08/18 0500  Weight: 88 kg 88.9 kg    Examination:  General exam: Appears calm and comfortable  Respiratory system: Diffuse bilateral wheezing to auscultation. Respiratory effort normal. Cardiovascular system: S1 & S2 heard, RRR. No JVD, murmurs, rubs, gallops or clicks. No pedal edema. Gastrointestinal system: Abdomen is nondistended, soft and nontender. No  organomegaly or masses felt. Normal bowel sounds heard. Central nervous system: Alert and oriented. No focal neurological deficits. Extremities: Trace to 1+ pitting edema bilaterally Skin: No rashes, lesions or ulcers Psychiatry: Judgement and insight appear normal. Mood & affect appropriate.     Data Reviewed: I have personally reviewed following labs and imaging studies  CBC: Recent Labs  Lab 06/07/18 0643 06/08/18 0701  WBC 7.7 9.2  NEUTROABS 5.5  --   HGB 13.4 12.3*  HCT 45.1 40.2  MCV 94.2 93.1  PLT 131* 141*   Basic Metabolic Panel: Recent Labs  Lab 06/07/18 0643 06/08/18 0701  NA 142 138  K 4.1 4.5  CL 96* 96*  CO2 34* 30  GLUCOSE 102* 128*  BUN 21 27*  CREATININE 1.19 1.00  CALCIUM 9.0 8.7*   GFR: Estimated Creatinine Clearance: 62.5 mL/min (by C-G formula based on SCr of 1 mg/dL). Liver Function Tests: Recent Labs  Lab 06/07/18 0643  AST 22  ALT 18  ALKPHOS 69  BILITOT 1.2  PROT 7.2  ALBUMIN 4.4   No results for input(s): LIPASE, AMYLASE in the last 168 hours. No results for input(s): AMMONIA in the last 168 hours. Coagulation Profile: Recent Labs  Lab 06/07/18 0643 06/08/18 0701  INR 2.97 2.76   Cardiac Enzymes: Recent Labs  Lab 06/07/18 1939 06/08/18 0701  TROPONINI <0.03 <0.03   BNP (last 3 results) No results for input(s): PROBNP in the last 8760 hours. HbA1C: No results for input(s): HGBA1C in the last 72 hours. CBG: No results for input(s): GLUCAP in the last 168 hours. Lipid Profile: No results for input(s): CHOL, HDL, LDLCALC, TRIG, CHOLHDL, LDLDIRECT in the last 72 hours. Thyroid Function Tests: Recent Labs    06/07/18 1939  TSH 0.229*   Anemia Panel: No results for input(s): VITAMINB12, FOLATE, FERRITIN, TIBC, IRON, RETICCTPCT in the last 72 hours. Sepsis Labs: Recent Labs  Lab 06/07/18 0655  LATICACIDVEN 1.75    No results found for this or any previous visit (from the past 240 hour(s)).       Radiology  Studies: Ct Chest W Contrast  Result Date: 06/07/2018 CLINICAL DATA:  Dyspnea. Productive cough. Reported history of right lower lobectomy for hamartoma. EXAM: CT CHEST WITH CONTRAST TECHNIQUE: Multidetector CT imaging of the chest was performed during intravenous contrast administration. CONTRAST:  75mL OMNIPAQUE IOHEXOL 300 MG/ML  SOLN COMPARISON:  Chest radiograph from earlier today. 08/19/2005 chest CT. FINDINGS: Cardiovascular: Top-normal heart size. No significant pericardial effusion/thickening. Three-vessel coronary atherosclerosis status post CABG. Atherosclerotic nonaneurysmal thoracic aorta. Dilated main pulmonary artery (3.9 cm diameter), mildly increased from 3.6 cm on 2007 chest CT. No central pulmonary emboli. Mediastinum/Nodes: No discrete thyroid nodules. Unremarkable esophagus. No pathologically enlarged axillary, mediastinal or hilar lymph nodes. Lungs/Pleura: No pneumothorax. No pleural effusion. Status post right lower lobectomy. Right upper lobe 1.8 cm ground-glass pulmonary nodule (series 7/image 50), new since 08/19/2005 chest CT. Occlusion of proximal right middle lobe bronchus by nonspecific material. Patchy tree-in-bud opacities in the right middle lobe and basilar right upper lobe. Solid 7 mm basilar right lower lobe pulmonary  nodule (series 7/image 91), stable since 05/20/2013 CT abdomen study, considered benign. Calcified 9 mm anterior left upper lobe granuloma. No acute consolidative airspace disease or lung masses. Mild centrilobular emphysema with diffuse bronchial wall thickening. Upper abdomen: Incomplete visualization of known infrarenal abdominal aortic aneurysm. Nonobstructing 6 mm interpolar right renal stone. Musculoskeletal: No aggressive appearing focal osseous lesions. Intact sternotomy wires. Marked thoracic spondylosis. IMPRESSION: 1. Mild centrilobular emphysema with diffuse bronchial wall thickening, suggesting COPD. 2. Status post right lower lobectomy. Occlusion of  the proximal right middle lobe bronchus by nonspecific material, probably mucoid impaction, difficult to exclude underlying endobronchial lesion. Recommend attention on follow-up chest CT versus bronchoscopic evaluation. 3. Mild patchy tree-in-bud opacities in the right middle and basilar right upper lobes, compatible with nonspecific infectious or inflammatory bronchiolitis. No acute consolidative airspace disease to suggest a pneumonia. 4. Ground-glass 1.8 cm right upper lobe pulmonary nodule, new since 2007 chest CT. Initial follow-up with CT at 6-12 months is recommended to confirm persistence. If persistent, repeat CT is recommended every 2 years until 5 years of stability has been established. This recommendation follows the consensus statement: Guidelines for Management of Incidental Pulmonary Nodules Detected on CT Images:From the Fleischner Society 2017; published online before print (10.1148/radiol.4098119147). 5. Stable dilated main pulmonary artery, suggesting chronic pulmonary arterial hypertension. 6. Nonobstructing right nephrolithiasis. Aortic Atherosclerosis (ICD10-I70.0) and Emphysema (ICD10-J43.9). Electronically Signed   By: Delbert Phenix M.D.   On: 06/07/2018 10:58   Dg Chest Port 1 View  Result Date: 06/07/2018 CLINICAL DATA:  Respiratory distress EXAM: PORTABLE CHEST 1 VIEW COMPARISON:  05/19/2018 FINDINGS: Chronic elevation of the right diaphragm with costophrenic sulcus blunting on the right-consistent with scarring. Cardiomegaly after CABG. There is no edema, consolidation, effusion, or pneumothorax. IMPRESSION: Stable from prior.  No evidence of acute disease. Electronically Signed   By: Marnee Spring M.D.   On: 06/07/2018 07:36        Scheduled Meds: . amLODipine  10 mg Oral Daily  . aspirin EC  81 mg Oral BH-q7a  . atorvastatin  40 mg Oral Daily  . cholecalciferol  1,000 Units Oral BH-q7a  . docusate sodium  100 mg Oral BID  . doxycycline  100 mg Oral Q12H  .  fluticasone furoate-vilanterol  1 puff Inhalation Daily  . furosemide  40 mg Oral Daily  . guaiFENesin  600 mg Oral BID  . ipratropium-albuterol  3 mL Nebulization QID  . methylPREDNISolone (SOLU-MEDROL) injection  40 mg Intravenous Q6H  . potassium chloride SA  20 mEq Oral Daily  . warfarin  5 mg Oral ONCE-1800  . Warfarin - Pharmacist Dosing Inpatient   Does not apply q1800   Continuous Infusions:   LOS: 0 days    Alwyn Ren, MD [facility]  If 7PM-7AM, please contact night-coverage [email or web address] Password [email or web address] 06/08/2018, 1:01 PM

## 2018-06-09 LAB — PROTIME-INR
INR: 3.46
PROTHROMBIN TIME: 34.3 s — AB (ref 11.4–15.2)

## 2018-06-09 MED ORDER — DOXYCYCLINE HYCLATE 100 MG PO TABS: 100.0000 mg | ORAL_TABLET | Freq: Two times a day (BID) | ORAL | 0 refills | Status: DC

## 2018-06-09 NOTE — Progress Notes (Signed)
ANTICOAGULATION CONSULT NOTE  Pharmacy Consult for warfarin Indication: atrial fibrillation  No Known Allergies  Patient Measurements: Height: 6' (182.9 cm) Weight: 195 lb 8 oz (88.7 kg) IBW/kg (Calculated) : 77.6  Vital Signs: Temp: 97.7 F (36.5 C) (01/18 0534) Temp Source: Oral (01/18 0534) BP: 127/62 (01/18 0534) Pulse Rate: 52 (01/18 0534)  Labs: Recent Labs    06/07/18 0643 06/07/18 1939 06/08/18 0701 06/09/18 0510  HGB 13.4  --  12.3*  --   HCT 45.1  --  40.2  --   PLT 131*  --  141*  --   LABPROT 30.5*  --  28.8* 34.3*  INR 2.97  --  2.76 3.46  CREATININE 1.19  --  1.00  --   TROPONINI  --  <0.03 <0.03  --     Estimated Creatinine Clearance: 62.5 mL/min (by C-G formula based on SCr of 1 mg/dL).   Medical History: Past Medical History:  Diagnosis Date  . AAA (abdominal aortic aneurysm) (HCC)    4.2 X 4.8 cm 04/2013 CT; declined re-eval 02/25/15  . Anxiety   . CAD in native artery March 2007   Cath for dyspnea on exertion: 75% distal LM, 85% RI, 95% mid-distal Cx, in multiple RCA lesions with 95% distal. --> Referred for CABG; has declined further noninvasive evaluation in the absence of worsening symptoms  . COPD (chronic obstructive pulmonary disease) with emphysema (HCC) 05/23/2007   Hosp 5/29-6/01/12- COPD exacerbation ONOX 12/08/10- desaturated to less than 88% for over an hour, qualifying for home O2 during sleep    . Diverticulosis of colon with hemorrhage 05/24/2013  . Dyslipidemia, goal LDL below 70   . History of non-ST Elevation MI (myocardial infarction) March 2007   Admitted with COPD exacerbation, given by CHF. Had some chest pain with positive troponins. Cath showed multivessel disease, referred for CABG  . History of pneumonia   . Hypertension   . Hypoxia     chronic, on home O2  . Obesity (BMI 30-39.9)    One year ago weighed 265 pounds, (12/2012) -- now 234 pounds  . PAF (paroxysmal atrial fibrillation) (HCC)    Anticoagulated on warfarin.  Stable -- post op  . Pneumonia    hx  . 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 Seasonal allergies   . Shortness of breath dyspnea     Medications:  Warfarin 2.5 mg bid M/W/F/Sa and 2.5 mg daily Tu/Th/Su  Assessment: 85 y/o M with a h/o PAF on warfarin and recent h/o hospitalization for PNA admitted with AECOPD. Pharmacy consulted to continue dosing warfarin inpatient.   Verified with the patient that he does in fact take one 2.5 mg tablet on Tu/Th/Su and two 2.5 mg tablets on M/W/F/Sa (1 in the morning and 1 in the afternoon). Patient taking ASA 81 mg chronically given hx of CABG x4.  Today, 06/09/18:  - INR now supratherapeutic at 3.46 - CBC (on 1/17): Hgb 12.3, Plt 141 - No s/sx of bleeding - Heart healthy diet - Initiated doxycycline which may affect sensitivity to warfarin.   Goal of Therapy:   INR 2-3   Plan:  - No warfarin today due to supratherapeutic INR  - Daily PT/INR - CBC in AM  - Monitor for s/sx of bleeding   Greer Pickerel, PharmD, BCPS Pager: [phone] 06/09/2018 12:27 PM

## 2018-06-09 NOTE — Progress Notes (Signed)
PROGRESS NOTE    Roberto Reilly  ZOX:[id number] DOB: 12/18/1933 DOA: 06/07/2018 PCP: Pearson GrippeKim, Aveon, MD  Brief Narrative: 85 y.o.malewith past medical history of COPD, myocardial infarction, atrial fibrillation, CABG x4, history of congestive heart failure presented to hospital with shortness of breath cough and wheezing since yesterday. Patient has been using his home nebulizer treatment without any relief. At baseline patient uses 3 to 4 L of oxygen for chronic hypoxia. Patient was recently admitted to the hospital almost 3 weeks back for pneumonia. Patient states that he has been having productive sputum greenish in color for the last 2 to 3 days. He however denies any fever, chills or rigor. Denies any nausea, vomiting or abdominal pain. Patient denies any chest pain, palpitation, dizziness or lightheadedness. Patient denies urinary urgency, frequency or dysuria.  ED Course:In the ED, patient received nebulizer and IV Solu-Medrol. He declined a 1 hour treatment of albuterol and was extremely wheezy despite his initial treatment. He also had difficulty ambulating and required 5 L of oxygen in the ED with increased work of breathing,so the hospitalist team was consulted for observation in the hospital. Patient however refused CPAP placement in the ED. CT scan of the chest was done in the ED which noted evidence of emphysema with right lower lobectomy, patchy tree-in-bud opacities in the right middle and basilar right upper lobes  Assessment & Plan:   Principal Problem:   COPD with acute exacerbation (HCC) Active Problems:   Obesity (BMI 30-39.9)   CAD in native artery - LM, RCA, RI & Cx disease --> CABG x 4 (LIMA-LAD, SVG-OM/RI, SVG-rPDA-rPL)   Paroxysmal atrial fibrillation (HCC); CHA2DS2-VASc Score = 4. On Warfarin   Dyslipidemia, goal LDL below 70   Essential hypertension   Chronic renal failure syndrome, stage 3 (moderate) (HCC)   Respiratory failure (HCC)   Acute exacerbation  of chronic obstructive pulmonary disease (COPD) (HCC)   Acute COPD exacerbation-patient presented with increasing shortness of breath cough with green phlegm.  Still with diffuse wheezing continue IV steroids.  Continue empiric doxycycline.  Continue nebulizer treatments.  At baseline he uses 3 to 4 L of oxygen at home.   Chronicdiastoliccongestive heart failure-stable continue home dose of Lasix.  Hypertension. Will closely monitor. Resume outpatient medication. Continue amlodipine  Hyperlipidemia. Continue Statins.   Atrial fibrillation,rate controlled will closely monitor in telemetry.  Resume warfarin for anticoagulation check 2 D echocardiogram.   Severity patient still requiring IV steroids and has diffuse bilateral wheezing.    Estimated body mass index is 26.51 kg/m as calculated from the following:   Height as of this encounter: 6' (1.829 m).   Weight as of this encounter: 88.7 kg.  DVT prophylaxis: lovenox Code Status: full Family Communication: none Disposition Plan: plan dc in am if wheezing improved  Consultants:  none Procedures: none Antimicrobials:  doxy Subjective: Feels a bit better still coughing up and sob than usual  Objective: Vitals:   06/08/18 2336 06/09/18 0440 06/09/18 0534 06/09/18 0958  BP: (!) 141/61  127/62   Pulse: 64  (!) 52   Resp:   18   Temp:   97.7 F (36.5 C)   TempSrc:   Oral   SpO2: 95% 97% 91% 92%  Weight:   88.7 kg   Height:        Intake/Output Summary (Last 24 hours) at 06/09/2018 1415 Last data filed at 06/09/2018 0130 Gross per 24 hour  Intake 120 ml  Output 150 ml  Net -30 ml  Filed Weights   06/07/18 0624 06/08/18 0500 06/09/18 0534  Weight: 88 kg 88.9 kg 88.7 kg    Examination:  General exam: Appears calm and comfortable  Respiratory system: diffuse wheezing to auscultation. Respiratory effort normal. Cardiovascular system: S1 & S2 heard, RRR. No JVD, murmurs, rubs, gallops or clicks. No  pedal edema. Gastrointestinal system: Abdomen is nondistended, soft and nontender. No organomegaly or masses felt. Normal bowel sounds heard. Central nervous system: Alert and oriented. No focal neurological deficits. Extremities: Symmetric 5 x 5 power. Skin: No rashes, lesions or ulcers Psychiatry: Judgement and insight appear normal. Mood & affect appropriate.     Data Reviewed: I have personally reviewed following labs and imaging studies  CBC: Recent Labs  Lab 06/07/18 0643 06/08/18 0701  WBC 7.7 9.2  NEUTROABS 5.5  --   HGB 13.4 12.3*  HCT 45.1 40.2  MCV 94.2 93.1  PLT 131* 141*   Basic Metabolic Panel: Recent Labs  Lab 06/07/18 0643 06/08/18 0701  NA 142 138  K 4.1 4.5  CL 96* 96*  CO2 34* 30  GLUCOSE 102* 128*  BUN 21 27*  CREATININE 1.19 1.00  CALCIUM 9.0 8.7*   GFR: Estimated Creatinine Clearance: 62.5 mL/min (by C-G formula based on SCr of 1 mg/dL). Liver Function Tests: Recent Labs  Lab 06/07/18 0643  AST 22  ALT 18  ALKPHOS 69  BILITOT 1.2  PROT 7.2  ALBUMIN 4.4   No results for input(s): LIPASE, AMYLASE in the last 168 hours. No results for input(s): AMMONIA in the last 168 hours. Coagulation Profile: Recent Labs  Lab 06/07/18 0643 06/08/18 0701 06/09/18 0510  INR 2.97 2.76 3.46   Cardiac Enzymes: Recent Labs  Lab 06/07/18 1939 06/08/18 0701  TROPONINI <0.03 <0.03   BNP (last 3 results) No results for input(s): PROBNP in the last 8760 hours. HbA1C: No results for input(s): HGBA1C in the last 72 hours. CBG: No results for input(s): GLUCAP in the last 168 hours. Lipid Profile: No results for input(s): CHOL, HDL, LDLCALC, TRIG, CHOLHDL, LDLDIRECT in the last 72 hours. Thyroid Function Tests: Recent Labs    06/07/18 1939  TSH 0.229*   Anemia Panel: No results for input(s): VITAMINB12, FOLATE, FERRITIN, TIBC, IRON, RETICCTPCT in the last 72 hours. Sepsis Labs: Recent Labs  Lab 06/07/18 0655  LATICACIDVEN 1.75    No  results found for this or any previous visit (from the past 240 hour(s)).       Radiology Studies: No results found.      Scheduled Meds: . amLODipine  10 mg Oral Daily  . aspirin EC  81 mg Oral BH-q7a  . atorvastatin  40 mg Oral Daily  . cholecalciferol  1,000 Units Oral BH-q7a  . docusate sodium  100 mg Oral BID  . doxycycline  100 mg Oral Q12H  . fluticasone furoate-vilanterol  1 puff Inhalation Daily  . furosemide  40 mg Oral Daily  . guaiFENesin  600 mg Oral BID  . ipratropium-albuterol  3 mL Nebulization QID  . methylPREDNISolone (SOLU-MEDROL) injection  40 mg Intravenous Q6H  . potassium chloride SA  20 mEq Oral Daily  . Warfarin - Pharmacist Dosing Inpatient   Does not apply q1800   Continuous Infusions:   LOS: 1 day     Alwyn RenElizabeth G Naika Noto, MD [facility]  If 7PM-7AM, please contact night-coverage [email or web address] Password [email or web address] 06/09/2018, 2:15 PM

## 2018-06-10 LAB — PROTIME-INR
INR: 3.19
Prothrombin Time: 32.2 seconds — ABNORMAL HIGH (ref 11.4–15.2)

## 2018-06-10 LAB — CBC
HCT: 38 % — ABNORMAL LOW (ref 39.0–52.0)
Hemoglobin: 11.9 g/dL — ABNORMAL LOW (ref 13.0–17.0)
MCH: 28.5 pg (ref 26.0–34.0)
MCHC: 31.3 g/dL (ref 30.0–36.0)
MCV: 91.1 fL (ref 80.0–100.0)
Platelets: 150 10*3/uL (ref 150–400)
RBC: 4.17 MIL/uL — ABNORMAL LOW (ref 4.22–5.81)
RDW: 14.8 % (ref 11.5–15.5)
WBC: 8.3 10*3/uL (ref 4.0–10.5)
nRBC: 0 % (ref 0.0–0.2)

## 2018-06-10 MED ORDER — GUAIFENESIN ER 600 MG PO TB12: 600.0000 mg | ORAL_TABLET | Freq: Two times a day (BID) | ORAL | Status: DC

## 2018-06-10 MED ORDER — PREDNISONE 10 MG PO TABS: ORAL_TABLET | ORAL | 0 refills | Status: DC

## 2018-06-10 NOTE — Care Management (Signed)
CM consult for Home Health to draw INR tomorrow. Pt had previously declined home health services. This CM okayed with MD that pt have INR checked at PCP office tomorrow. This CM placed instructions on AVS and alerted staff RN to go over plan with pt. Sandford Craze RN,BSN [phone]

## 2018-06-10 NOTE — Progress Notes (Signed)
ANTICOAGULATION CONSULT NOTE  Pharmacy Consult for warfarin Indication: atrial fibrillation  No Known Allergies  Patient Measurements: Height: 6' (182.9 cm) Weight: 198 lb 6.6 oz (90 kg) IBW/kg (Calculated) : 77.6  Vital Signs: Temp: 97.5 F (36.4 C) (01/19 0615) Temp Source: Oral (01/19 0615) BP: 125/60 (01/19 0849) Pulse Rate: 54 (01/19 0615)  Labs: Recent Labs    06/07/18 1939 06/08/18 0701 06/09/18 0510 06/10/18 0536  HGB  --  12.3*  --  11.9*  HCT  --  40.2  --  38.0*  PLT  --  141*  --  150  LABPROT  --  28.8* 34.3* 32.2*  INR  --  2.76 3.46 3.19  CREATININE  --  1.00  --   --   TROPONINI <0.03 <0.03  --   --     Estimated Creatinine Clearance: 62.5 mL/min (by C-G formula based on SCr of 1 mg/dL).   Medical History: Past Medical History:  Diagnosis Date  . AAA (abdominal aortic aneurysm) (HCC)    4.2 X 4.8 cm 04/2013 CT; declined re-eval 02/25/15  . Anxiety   . CAD in native artery March 2007   Cath for dyspnea on exertion: 75% distal LM, 85% RI, 95% mid-distal Cx, in multiple RCA lesions with 95% distal. --> Referred for CABG; has declined further noninvasive evaluation in the absence of worsening symptoms  . COPD (chronic obstructive pulmonary disease) with emphysema (HCC) 05/23/2007   Hosp 5/29-6/01/12- COPD exacerbation ONOX 12/08/10- desaturated to less than 88% for over an hour, qualifying for home O2 during sleep    . Diverticulosis of colon with hemorrhage 05/24/2013  . Dyslipidemia, goal LDL below 70   . History of non-ST Elevation MI (myocardial infarction) March 2007   Admitted with COPD exacerbation, given by CHF. Had some chest pain with positive troponins. Cath showed multivessel disease, referred for CABG  . History of pneumonia   . Hypertension   . Hypoxia     chronic, on home O2  . Obesity (BMI 30-39.9)    One year ago weighed 265 pounds, (12/2012) -- now 234 pounds  . PAF (paroxysmal atrial fibrillation) (HCC)    Anticoagulated on  warfarin. Stable -- post op  . Pneumonia    hx  . 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 Seasonal allergies   . Shortness of breath dyspnea     Medications:  Warfarin 2.5 mg bid M/W/F/Sa and 2.5 mg daily Tu/Th/Su  Assessment: 85 y/o M with a h/o PAF on warfarin and recent h/o hospitalization for PNA admitted with AECOPD. Pharmacy consulted to continue dosing warfarin inpatient.   Verified with the patient that he does in fact take one 2.5 mg tablet on Tu/Th/Su and two 2.5 mg tablets on M/W/F/Sa (1 in the morning and 1 in the afternoon). Patient taking ASA 81 mg chronically given hx of CABG x4.  Today, 06/10/18:  - INR 3.19, remains supratherapeutic after holding dose yesterday, but trending down  - CBC: Hgb 11.9, Pltc WNL - No s/sx of bleeding reported - Heart healthy diet - On doxycycline and steroids, which may affect sensitivity to warfarin.   Goal of Therapy:   INR 2-3   Plan:  - No warfarin today due to supratherapeutic INR  - Daily PT/INR - Monitor CBC and for s/sx of bleeding   Greer Pickerel, PharmD, BCPS Pager: [phone] 06/10/2018 10:26 AM

## 2018-06-10 NOTE — Care Management (Signed)
Per staff RN pt and daughter have changed their minds about home health services. Choice offered and [facility] chosen. [facility] rep alerted that pt will need HHPT and HHRN for INR blood draw on 06/11/2018. Orders placed by MD Sandford CrazeNora Ladrea Holladay RN,BSN [phone]

## 2018-06-10 NOTE — Discharge Summary (Signed)
Physician Discharge Summary  Roberto Reilly ZOX:[id number] DOB: April 14, 1934 DOA: 06/07/2018  PCP: Pearson Grippe, MD  Admit date: 06/07/2018 Discharge date: 06/10/2018  Admitted From: Home Disposition: Home Recommendations for Outpatient Follow-up:  1. Follow up with PCP in 1-2 weeks 2. Please obtain BMP/CBC in one week   Home Health: None Equipment/Devices: Rollator  Discharge Condition stable and improved CODE STATUS full code Diet recommendation: Cardiac Brief/Interim Summary:84 y.o.malewith past medical history of COPD, myocardial infarction, atrial fibrillation, CABG x4, history of congestive heart failure presented to hospital with shortness of breath cough and wheezing since yesterday. Patient has been using his home nebulizer treatment without any relief. At baseline patient uses 3 to 4 L of oxygen for chronic hypoxia. Patient was recently admitted to the hospital almost 3 weeks back for pneumonia. Patient states that he has been having productive sputum greenish in color for the last 2 to 3 days. He however denies any fever, chills or rigor. Denies any nausea, vomiting or abdominal pain. Patient denies any chest pain, palpitation, dizziness or lightheadedness. Patient denies urinary urgency, frequency or dysuria.  ED Course:In the ED, patient received nebulizer and IV Solu-Medrol. He declined a 1 hour treatment of albuterol and was extremely wheezy despite his initial treatment. He also had difficulty ambulating and required 5 L of oxygen in the ED with increased work of breathing,so the hospitalist team was consulted for observation in the hospital. Patient however refused CPAP placement in the ED. CT scan of the chest was done in the ED which noted evidence of emphysema with right lower lobectomy, patchy tree-in-bud opacities in the right middle and basilar right upper lobes    Discharge Diagnoses:  Principal Problem:   COPD with acute exacerbation (HCC) Active  Problems:   Obesity (BMI 30-39.9)   CAD in native artery - LM, RCA, RI & Cx disease --> CABG x 4 (LIMA-LAD, SVG-OM/RI, SVG-rPDA-rPL)   Paroxysmal atrial fibrillation (HCC); CHA2DS2-VASc Score = 4. On Warfarin   Dyslipidemia, goal LDL below 70   Essential hypertension   Chronic renal failure syndrome, stage 3 (moderate) (HCC)   Respiratory failure (HCC)   Acute exacerbation of chronic obstructive pulmonary disease (COPD) (HCC)  Acute COPD exacerbation-patient presented with increasing shortness of breath cough with green phlegm. Notes and doxycycline and nebulizers.At baseline he uses 3 to 4 L of oxygen at home.   Chronicdiastoliccongestive heart failure-stable continue home dose of Lasix.  Hypertension-Continue amlodipine  Hyperlipidemia. Continue Statins.   Atrial fibrillation,rate controlled will closely monitor in telemetry. Resume warfarin for anticoagulation check 2 D echocardiogram-ef 55 %  His INR was supratherapeutic on discharge he was advised to check PT/INR 06/11/2018 before taking any more Coumadin dose.  Estimated body mass index is 26.91 kg/m as calculated from the following:   Height as of this encounter: 6' (1.829 m).   Weight as of this encounter: 90 kg.  Discharge Instructions  Discharge Instructions    Call MD for:  difficulty breathing, headache or visual disturbances   Complete by:  As directed    Call MD for:  persistant dizziness or light-headedness   Complete by:  As directed    Call MD for:  persistant nausea and vomiting   Complete by:  As directed    Diet - low sodium heart healthy   Complete by:  As directed    Increase activity slowly   Complete by:  As directed      Allergies as of 06/10/2018   No Known Allergies  Medication List    TAKE these medications   amLODipine 10 MG tablet Commonly known as:  NORVASC Take 1 tablet (10 mg total) by mouth daily.   aspirin EC 81 MG tablet Take 81 mg by mouth every morning.    atorvastatin 40 MG tablet Commonly known as:  LIPITOR Take 1 tablet (40 mg total) by mouth daily.   budesonide-formoterol 160-4.5 MCG/ACT inhaler Commonly known as:  SYMBICORT Inhale 2 puffs into the lungs 2 (two) times daily.   cholecalciferol 1000 units tablet Commonly known as:  VITAMIN D Take 1,000 Units by mouth every morning.   doxycycline 100 MG tablet Commonly known as:  VIBRA-TABS Take 1 tablet (100 mg total) by mouth every 12 (twelve) hours.   DUONEB 0.5-2.5 (3) MG/3ML Soln Generic drug:  ipratropium-albuterol Take 3 mLs by nebulization every 4 (four) hours as needed (wheezing and shortness of breath).   furosemide 40 MG tablet Commonly known as:  LASIX Take 1 tablet (40 mg total) daily by mouth. May take an extra 40 mg tablet daily if weight is above 213 lbs ,if needed   guaiFENesin 600 MG 12 hr tablet Commonly known as:  MUCINEX Take 1 tablet (600 mg total) by mouth 2 (two) times daily.   potassium chloride SA 20 MEQ tablet Commonly known as:  K-DUR,KLOR-CON Take 1 tablet (20 mEq total) by mouth daily.   predniSONE 10 MG tablet Commonly known as:  DELTASONE Take 40 mg that is 4 tablets daily for the first 4 days then 3 tablets daily for the following 4 days then 2 tablets daily for the following 4 days and then 1 tablet daily till done.   warfarin 2.5 MG tablet Commonly known as:  COUMADIN TAKE ONE TO TWO TABLETS BY MOUTH AS INSTRUCTED BY COUMADIN CLINIC What changed:    how much to take  how to take this  when to take this  additional instructions            Durable Medical Equipment  (From admission, onward)         Start     Ordered   06/08/18 1538  For home use only DME 4 wheeled rolling walker with seat  Once    Question:  Patient needs a walker to treat with the following condition  Answer:  Unsteady gait   06/08/18 1537         Follow-up Information    [facility] - Dme Follow up.   Why:  home rollator(4 wheeled  rw) Please check INR Monday, 06/11/2018 before taking Coumadin.   Contact information: [address] [place] [id number] [phone]        Pearson GrippeKim, Hulet, MD Follow up.   Specialty:  Internal Medicine Contact information: [address] [place] [id number] [phone]          No Known Allergies  Consultations:  None   Procedures/Studies: Dg Chest 2 View  Result Date: 05/19/2018 CLINICAL DATA:  85 year old acute onset of shortness of breath. Current history of COPD. Prior CABG. Prior RIGHT UPPER lobectomy for hamartoma. EXAM: CHEST - 2 VIEW COMPARISON:  05/15/2018 and earlier. FINDINGS: Sternotomy for CABG. Cardiac silhouette mildly enlarged, unchanged. Thoracic aorta tortuous and mildly atherosclerotic, unchanged. Postsurgical pleuroparenchymal scarring at the RIGHT base related to the prior RIGHT UPPER lobectomy, unchanged over multiple prior examinations. No new pulmonary parenchymal abnormalities. No pleural effusions currently. Degenerative changes and DISH involving the thoracic spine. IMPRESSION: No acute cardiopulmonary disease. Stable  post surgical pleuroparenchymal scarring at the RIGHT base. Electronically Signed   By: Hulan Saashomas  Lawrence M.D.   On: 05/19/2018 12:48   Dg Chest 2 View  Result Date: 05/15/2018 CLINICAL DATA:  Acute onset of shortness of breath and productive cough. EXAM: CHEST - 2 VIEW COMPARISON:  Chest radiograph performed 04/13/2018 FINDINGS: The patient is status post right lower lobe lung resection. A small right pleural effusion is noted. Right basilar airspace opacity may reflect atelectasis or possibly mild pneumonia, given the patient's symptoms. Underlying vascular congestion is noted. No pneumothorax is seen. The cardiomediastinal silhouette is borderline normal. The patient is status post median sternotomy, with evidence of prior CABG. No acute osseous abnormalities are identified. IMPRESSION: 1. Small right pleural  effusion noted. Right basilar airspace opacity may reflect atelectasis or possibly mild pneumonia, given the patient's symptoms. 2. Underlying vascular congestion noted. Electronically Signed   By: Roanna RaiderJeffery  Chang M.D.   On: 05/15/2018 21:35   Ct Chest W Contrast  Result Date: 06/07/2018 CLINICAL DATA:  Dyspnea. Productive cough. Reported history of right lower lobectomy for hamartoma. EXAM: CT CHEST WITH CONTRAST TECHNIQUE: Multidetector CT imaging of the chest was performed during intravenous contrast administration. CONTRAST:  75mL OMNIPAQUE IOHEXOL 300 MG/ML  SOLN COMPARISON:  Chest radiograph from earlier today. 08/19/2005 chest CT. FINDINGS: Cardiovascular: Top-normal heart size. No significant pericardial effusion/thickening. Three-vessel coronary atherosclerosis status post CABG. Atherosclerotic nonaneurysmal thoracic aorta. Dilated main pulmonary artery (3.9 cm diameter), mildly increased from 3.6 cm on 2007 chest CT. No central pulmonary emboli. Mediastinum/Nodes: No discrete thyroid nodules. Unremarkable esophagus. No pathologically enlarged axillary, mediastinal or hilar lymph nodes. Lungs/Pleura: No pneumothorax. No pleural effusion. Status post right lower lobectomy. Right upper lobe 1.8 cm ground-glass pulmonary nodule (series 7/image 50), new since 08/19/2005 chest CT. Occlusion of proximal right middle lobe bronchus by nonspecific material. Patchy tree-in-bud opacities in the right middle lobe and basilar right upper lobe. Solid 7 mm basilar right lower lobe pulmonary nodule (series 7/image 91), stable since 05/20/2013 CT abdomen study, considered benign. Calcified 9 mm anterior left upper lobe granuloma. No acute consolidative airspace disease or lung masses. Mild centrilobular emphysema with diffuse bronchial wall thickening. Upper abdomen: Incomplete visualization of known infrarenal abdominal aortic aneurysm. Nonobstructing 6 mm interpolar right renal stone. Musculoskeletal: No aggressive  appearing focal osseous lesions. Intact sternotomy wires. Marked thoracic spondylosis. IMPRESSION: 1. Mild centrilobular emphysema with diffuse bronchial wall thickening, suggesting COPD. 2. Status post right lower lobectomy. Occlusion of the proximal right middle lobe bronchus by nonspecific material, probably mucoid impaction, difficult to exclude underlying endobronchial lesion. Recommend attention on follow-up chest CT versus bronchoscopic evaluation. 3. Mild patchy tree-in-bud opacities in the right middle and basilar right upper lobes, compatible with nonspecific infectious or inflammatory bronchiolitis. No acute consolidative airspace disease to suggest a pneumonia. 4. Ground-glass 1.8 cm right upper lobe pulmonary nodule, new since 2007 chest CT. Initial follow-up with CT at 6-12 months is recommended to confirm persistence. If persistent, repeat CT is recommended every 2 years until 5 years of stability has been established. This recommendation follows the consensus statement: Guidelines for Management of Incidental Pulmonary Nodules Detected on CT Images:From the Fleischner Society 2017; published online before print (10.1148/radiol.[id number]). 5. Stable dilated main pulmonary artery, suggesting chronic pulmonary arterial hypertension. 6. Nonobstructing right nephrolithiasis. Aortic Atherosclerosis (ICD10-I70.0) and Emphysema (ICD10-J43.9). Electronically Signed   By: Delbert PhenixJason A Poff M.D.   On: 06/07/2018 10:58   Dg Chest Port 1 View  Result Date: 06/07/2018 CLINICAL DATA:  Respiratory  distress EXAM: PORTABLE CHEST 1 VIEW COMPARISON:  05/19/2018 FINDINGS: Chronic elevation of the right diaphragm with costophrenic sulcus blunting on the right-consistent with scarring. Cardiomegaly after CABG. There is no edema, consolidation, effusion, or pneumothorax. IMPRESSION: Stable from prior.  No evidence of acute disease. Electronically Signed   By: Marnee Spring M.D.   On: 06/07/2018 07:36    (Echo, Carotid,  EGD, Colonoscopy, ERCP)    Subjective:   Discharge Exam: Vitals:   06/10/18 0803 06/10/18 0849  BP:  125/60  Pulse:    Resp:    Temp:    SpO2: 90%    Vitals:   06/10/18 0149 06/10/18 0615 06/10/18 0803 06/10/18 0849  BP:  126/63  125/60  Pulse:  (!) 54    Resp:  (!) 22    Temp:  (!) 97.5 F (36.4 C)    TempSrc:  Oral    SpO2: 95% 98% 90%   Weight:  90 kg    Height:        General: Pt is alert, awake, not in acute distress Cardiovascular: RRR, S1/S2 +, no rubs, no gallops Respiratory: CTA bilaterally, no wheezing, no rhonchi Abdominal: Soft, NT, ND, bowel sounds + Extremities: no edema, no cyanosis    The results of significant diagnostics from this hospitalization (including imaging, microbiology, ancillary and laboratory) are listed below for reference.     Microbiology: No results found for this or any previous visit (from the past 240 hour(s)).   Labs: BNP (last 3 results) Recent Labs    05/15/18 2208 06/07/18 0643 06/07/18 1939  BNP 181.0* 133.1* 187.3*   Basic Metabolic Panel: Recent Labs  Lab 06/07/18 0643 06/08/18 0701  NA 142 138  K 4.1 4.5  CL 96* 96*  CO2 34* 30  GLUCOSE 102* 128*  BUN 21 27*  CREATININE 1.19 1.00  CALCIUM 9.0 8.7*   Liver Function Tests: Recent Labs  Lab 06/07/18 0643  AST 22  ALT 18  ALKPHOS 69  BILITOT 1.2  PROT 7.2  ALBUMIN 4.4   No results for input(s): LIPASE, AMYLASE in the last 168 hours. No results for input(s): AMMONIA in the last 168 hours. CBC: Recent Labs  Lab 06/07/18 0643 06/08/18 0701 06/10/18 0536  WBC 7.7 9.2 8.3  NEUTROABS 5.5  --   --   HGB 13.4 12.3* 11.9*  HCT 45.1 40.2 38.0*  MCV 94.2 93.1 91.1  PLT 131* 141* 150   Cardiac Enzymes: Recent Labs  Lab 06/07/18 1939 06/08/18 0701  TROPONINI <0.03 <0.03   BNP: Invalid input(s): POCBNP CBG: No results for input(s): GLUCAP in the last 168 hours. D-Dimer No results for input(s): DDIMER in the last 72 hours. Hgb A1c No  results for input(s): HGBA1C in the last 72 hours. Lipid Profile No results for input(s): CHOL, HDL, LDLCALC, TRIG, CHOLHDL, LDLDIRECT in the last 72 hours. Thyroid function studies Recent Labs    06/07/18 1939  TSH 0.229*   Anemia work up No results for input(s): VITAMINB12, FOLATE, FERRITIN, TIBC, IRON, RETICCTPCT in the last 72 hours. Urinalysis    Component Value Date/Time   COLORURINE YELLOW 06/07/2018 1256   APPEARANCEUR CLEAR 06/07/2018 1256   LABSPEC >1.046 (H) 06/07/2018 1256   PHURINE 7.0 06/07/2018 1256   GLUCOSEU 50 (A) 06/07/2018 1256   HGBUR NEGATIVE 06/07/2018 1256   BILIRUBINUR NEGATIVE 06/07/2018 1256   KETONESUR NEGATIVE 06/07/2018 1256   PROTEINUR NEGATIVE 06/07/2018 1256   UROBILINOGEN 0.2 10/19/2010 1150   NITRITE NEGATIVE 06/07/2018 1256  LEUKOCYTESUR NEGATIVE 06/07/2018 1256   Sepsis Labs Invalid input(s): PROCALCITONIN,  WBC,  LACTICIDVEN Microbiology No results found for this or any previous visit (from the past 240 hour(s)).   Time coordinating discharge: 34 minutes  SIGNED:   Alwyn Ren, MD  [facility] 06/10/2018, 10:44 AM Pager   If 7PM-7AM, please contact night-coverage [email or web address] Password [email or web address]

## 2018-06-11 ENCOUNTER — Ambulatory Visit: Payer: Self-pay | Admitting: Internal Medicine

## 2018-06-12 DIAGNOSIS — Z5181 Encounter for therapeutic drug level monitoring: Secondary | ICD-10-CM | POA: Diagnosis not present

## 2018-06-12 DIAGNOSIS — Z7901 Long term (current) use of anticoagulants: Secondary | ICD-10-CM | POA: Diagnosis not present

## 2018-06-12 DIAGNOSIS — Z9981 Dependence on supplemental oxygen: Secondary | ICD-10-CM | POA: Diagnosis not present

## 2018-06-12 DIAGNOSIS — J441 Chronic obstructive pulmonary disease with (acute) exacerbation: Secondary | ICD-10-CM | POA: Diagnosis not present

## 2018-06-12 DIAGNOSIS — Z951 Presence of aortocoronary bypass graft: Secondary | ICD-10-CM | POA: Diagnosis not present

## 2018-06-12 DIAGNOSIS — I5032 Chronic diastolic (congestive) heart failure: Secondary | ICD-10-CM | POA: Diagnosis not present

## 2018-06-12 DIAGNOSIS — I480 Paroxysmal atrial fibrillation: Secondary | ICD-10-CM | POA: Diagnosis not present

## 2018-06-12 DIAGNOSIS — Z7982 Long term (current) use of aspirin: Secondary | ICD-10-CM | POA: Diagnosis not present

## 2018-06-12 DIAGNOSIS — N183 Chronic kidney disease, stage 3 (moderate): Secondary | ICD-10-CM | POA: Diagnosis not present

## 2018-06-12 DIAGNOSIS — I130 Hypertensive heart and chronic kidney disease with heart failure and stage 1 through stage 4 chronic kidney disease, or unspecified chronic kidney disease: Secondary | ICD-10-CM | POA: Diagnosis not present

## 2018-06-13 DIAGNOSIS — N183 Chronic kidney disease, stage 3 (moderate): Secondary | ICD-10-CM | POA: Diagnosis not present

## 2018-06-13 DIAGNOSIS — Z951 Presence of aortocoronary bypass graft: Secondary | ICD-10-CM | POA: Diagnosis not present

## 2018-06-13 DIAGNOSIS — J441 Chronic obstructive pulmonary disease with (acute) exacerbation: Secondary | ICD-10-CM | POA: Diagnosis not present

## 2018-06-13 DIAGNOSIS — I130 Hypertensive heart and chronic kidney disease with heart failure and stage 1 through stage 4 chronic kidney disease, or unspecified chronic kidney disease: Secondary | ICD-10-CM | POA: Diagnosis not present

## 2018-06-13 DIAGNOSIS — I5032 Chronic diastolic (congestive) heart failure: Secondary | ICD-10-CM | POA: Diagnosis not present

## 2018-06-13 DIAGNOSIS — I480 Paroxysmal atrial fibrillation: Secondary | ICD-10-CM | POA: Diagnosis not present

## 2018-06-14 ENCOUNTER — Other Ambulatory Visit: Payer: Self-pay | Admitting: Cardiology

## 2018-06-16 ENCOUNTER — Other Ambulatory Visit: Payer: Self-pay | Admitting: Cardiology

## 2018-06-17 ENCOUNTER — Emergency Department (HOSPITAL_COMMUNITY): Payer: Medicare Other

## 2018-06-17 ENCOUNTER — Inpatient Hospital Stay (HOSPITAL_COMMUNITY)
Admission: EM | Admit: 2018-06-17 | Discharge: 2018-06-21 | DRG: 190 | Disposition: A | Discharge status: Hospice | Payer: Medicare Other | Attending: Internal Medicine | Admitting: Internal Medicine

## 2018-06-17 ENCOUNTER — Encounter (HOSPITAL_COMMUNITY): Payer: Self-pay

## 2018-06-17 DIAGNOSIS — Z7901 Long term (current) use of anticoagulants: Secondary | ICD-10-CM

## 2018-06-17 DIAGNOSIS — R05 Cough: Secondary | ICD-10-CM | POA: Diagnosis not present

## 2018-06-17 DIAGNOSIS — F329 Major depressive disorder, single episode, unspecified: Secondary | ICD-10-CM | POA: Diagnosis present

## 2018-06-17 DIAGNOSIS — Z89431 Acquired absence of right foot: Secondary | ICD-10-CM

## 2018-06-17 DIAGNOSIS — R7989 Other specified abnormal findings of blood chemistry: Secondary | ICD-10-CM

## 2018-06-17 DIAGNOSIS — J302 Other seasonal allergic rhinitis: Secondary | ICD-10-CM | POA: Diagnosis present

## 2018-06-17 DIAGNOSIS — I110 Hypertensive heart disease with heart failure: Secondary | ICD-10-CM | POA: Diagnosis present

## 2018-06-17 DIAGNOSIS — Z7952 Long term (current) use of systemic steroids: Secondary | ICD-10-CM

## 2018-06-17 DIAGNOSIS — F419 Anxiety disorder, unspecified: Secondary | ICD-10-CM | POA: Diagnosis not present

## 2018-06-17 DIAGNOSIS — R0602 Shortness of breath: Secondary | ICD-10-CM | POA: Diagnosis not present

## 2018-06-17 DIAGNOSIS — J9621 Acute and chronic respiratory failure with hypoxia: Secondary | ICD-10-CM | POA: Diagnosis not present

## 2018-06-17 DIAGNOSIS — I714 Abdominal aortic aneurysm, without rupture: Secondary | ICD-10-CM | POA: Diagnosis present

## 2018-06-17 DIAGNOSIS — J189 Pneumonia, unspecified organism: Secondary | ICD-10-CM | POA: Diagnosis not present

## 2018-06-17 DIAGNOSIS — I5032 Chronic diastolic (congestive) heart failure: Secondary | ICD-10-CM | POA: Diagnosis present

## 2018-06-17 DIAGNOSIS — Z8249 Family history of ischemic heart disease and other diseases of the circulatory system: Secondary | ICD-10-CM

## 2018-06-17 DIAGNOSIS — Z515 Encounter for palliative care: Secondary | ICD-10-CM | POA: Diagnosis present

## 2018-06-17 DIAGNOSIS — I480 Paroxysmal atrial fibrillation: Secondary | ICD-10-CM | POA: Diagnosis present

## 2018-06-17 DIAGNOSIS — R739 Hyperglycemia, unspecified: Secondary | ICD-10-CM | POA: Diagnosis present

## 2018-06-17 DIAGNOSIS — Z7982 Long term (current) use of aspirin: Secondary | ICD-10-CM

## 2018-06-17 DIAGNOSIS — J440 Chronic obstructive pulmonary disease with acute lower respiratory infection: Secondary | ICD-10-CM | POA: Diagnosis not present

## 2018-06-17 DIAGNOSIS — Z87891 Personal history of nicotine dependence: Secondary | ICD-10-CM

## 2018-06-17 DIAGNOSIS — Z66 Do not resuscitate: Secondary | ICD-10-CM | POA: Diagnosis present

## 2018-06-17 DIAGNOSIS — R778 Other specified abnormalities of plasma proteins: Secondary | ICD-10-CM | POA: Diagnosis not present

## 2018-06-17 DIAGNOSIS — I2510 Atherosclerotic heart disease of native coronary artery without angina pectoris: Secondary | ICD-10-CM | POA: Diagnosis present

## 2018-06-17 DIAGNOSIS — Z9981 Dependence on supplemental oxygen: Secondary | ICD-10-CM

## 2018-06-17 DIAGNOSIS — J441 Chronic obstructive pulmonary disease with (acute) exacerbation: Principal | ICD-10-CM | POA: Diagnosis present

## 2018-06-17 DIAGNOSIS — Z8679 Personal history of other diseases of the circulatory system: Secondary | ICD-10-CM

## 2018-06-17 DIAGNOSIS — Z951 Presence of aortocoronary bypass graft: Secondary | ICD-10-CM

## 2018-06-17 DIAGNOSIS — I10 Essential (primary) hypertension: Secondary | ICD-10-CM | POA: Diagnosis present

## 2018-06-17 DIAGNOSIS — E785 Hyperlipidemia, unspecified: Secondary | ICD-10-CM | POA: Diagnosis present

## 2018-06-17 DIAGNOSIS — R0689 Other abnormalities of breathing: Secondary | ICD-10-CM | POA: Diagnosis present

## 2018-06-17 DIAGNOSIS — Z7951 Long term (current) use of inhaled steroids: Secondary | ICD-10-CM

## 2018-06-17 DIAGNOSIS — I252 Old myocardial infarction: Secondary | ICD-10-CM

## 2018-06-17 DIAGNOSIS — Z79899 Other long term (current) drug therapy: Secondary | ICD-10-CM

## 2018-06-17 LAB — TROPONIN I
TROPONIN I: 0.03 ng/mL — AB (ref <0.03)
Troponin I: 0.03 ng/mL (ref <0.03)
Troponin I: <0.03 ng/mL (ref <0.03)

## 2018-06-17 LAB — BASIC METABOLIC PANEL
Anion gap: 6 (ref 5–15)
BUN: 34 mg/dL — AB (ref 8–23)
CO2: 39 mmol/L — ABNORMAL HIGH (ref 22–32)
CREATININE: 1.1 mg/dL (ref 0.61–1.24)
Calcium: 8.7 mg/dL — ABNORMAL LOW (ref 8.9–10.3)
Chloride: 97 mmol/L — ABNORMAL LOW (ref 98–111)
GFR calc Af Amer: >60 mL/min (ref >60)
GFR calc non Af Amer: >60 mL/min (ref >60)
Glucose, Bld: 93 mg/dL (ref 70–99)
Potassium: 4 mmol/L (ref 3.5–5.1)
Sodium: 142 mmol/L (ref 135–145)

## 2018-06-17 LAB — CBC
HCT: 42.3 % (ref 39.0–52.0)
Hemoglobin: 12.7 g/dL — ABNORMAL LOW (ref 13.0–17.0)
MCH: 28.5 pg (ref 26.0–34.0)
MCHC: 30 g/dL (ref 30.0–36.0)
MCV: 95.1 fL (ref 80.0–100.0)
Platelets: 214 10*3/uL (ref 150–400)
RBC: 4.45 MIL/uL (ref 4.22–5.81)
RDW: 15.1 % (ref 11.5–15.5)
WBC: 9.4 10*3/uL (ref 4.0–10.5)
nRBC: 0 % (ref 0.0–0.2)

## 2018-06-17 LAB — BRAIN NATRIURETIC PEPTIDE: B Natriuretic Peptide: 167.6 pg/mL — ABNORMAL HIGH (ref 0.0–100.0)

## 2018-06-17 LAB — CBG MONITORING, ED: Glucose-Capillary: 244 mg/dL — ABNORMAL HIGH (ref 70–99)

## 2018-06-17 LAB — PROTIME-INR
INR: 1.71
Prothrombin Time: 19.9 seconds — ABNORMAL HIGH (ref 11.4–15.2)

## 2018-06-17 MED ORDER — WARFARIN SODIUM 5 MG PO TABS
5.0000 mg | ORAL_TABLET | Freq: Once | ORAL | Status: AC
  Administered 2018-06-17: 5 mg via ORAL
  Filled 2018-06-17: qty 1

## 2018-06-17 MED ORDER — IOPAMIDOL (ISOVUE-370) INJECTION 76%
INTRAVENOUS | Status: AC
  Filled 2018-06-17: qty 100

## 2018-06-17 MED ORDER — IPRATROPIUM BROMIDE 0.02 % IN SOLN: 0.5000 mg | Freq: Four times a day (QID) | RESPIRATORY_TRACT | Status: DC

## 2018-06-17 MED ORDER — ACETAZOLAMIDE 250 MG PO TABS
250.0000 mg | ORAL_TABLET | Freq: Two times a day (BID) | ORAL | Status: DC
  Administered 2018-06-17 – 2018-06-20 (×6): 250 mg via ORAL
  Filled 2018-06-17 (×8): qty 1

## 2018-06-17 MED ORDER — LORAZEPAM 2 MG/ML IJ SOLN
1.0000 mg | INTRAMUSCULAR | Status: DC | PRN
  Administered 2018-06-17: 1 mg via INTRAVENOUS
  Filled 2018-06-17: qty 1

## 2018-06-17 MED ORDER — ONDANSETRON HCL 4 MG PO TABS: 4.0000 mg | ORAL_TABLET | Freq: Four times a day (QID) | ORAL | Status: DC | PRN

## 2018-06-17 MED ORDER — ONDANSETRON HCL 4 MG/2ML IJ SOLN: 4.0000 mg | Freq: Four times a day (QID) | INTRAMUSCULAR | Status: DC | PRN

## 2018-06-17 MED ORDER — WARFARIN - PHARMACIST DOSING INPATIENT: Freq: Every day | Status: DC

## 2018-06-17 MED ORDER — SODIUM CHLORIDE 0.9 % IV SOLN
INTRAVENOUS | Status: DC | PRN
  Administered 2018-06-17: 1000 mL via INTRAVENOUS

## 2018-06-17 MED ORDER — AMLODIPINE BESYLATE 10 MG PO TABS
10.0000 mg | ORAL_TABLET | Freq: Every day | ORAL | Status: DC
  Administered 2018-06-19 – 2018-06-20 (×2): 10 mg via ORAL
  Filled 2018-06-17 (×2): qty 1

## 2018-06-17 MED ORDER — ALBUTEROL SULFATE (2.5 MG/3ML) 0.083% IN NEBU: 2.5000 mg | INHALATION_SOLUTION | Freq: Four times a day (QID) | RESPIRATORY_TRACT | Status: DC

## 2018-06-17 MED ORDER — ASPIRIN EC 81 MG PO TBEC
81.0000 mg | DELAYED_RELEASE_TABLET | Freq: Every day | ORAL | Status: DC
  Administered 2018-06-19 – 2018-06-20 (×2): 81 mg via ORAL
  Filled 2018-06-17 (×2): qty 1

## 2018-06-17 MED ORDER — METHYLPREDNISOLONE SODIUM SUCC 40 MG IJ SOLR
40.0000 mg | Freq: Four times a day (QID) | INTRAMUSCULAR | Status: AC
  Administered 2018-06-17: 40 mg via INTRAVENOUS
  Filled 2018-06-17: qty 1

## 2018-06-17 MED ORDER — INSULIN ASPART 100 UNIT/ML ~~LOC~~ SOLN
0.0000 [IU] | Freq: Three times a day (TID) | SUBCUTANEOUS | Status: DC
  Administered 2018-06-17: 5 [IU] via SUBCUTANEOUS
  Administered 2018-06-19: 3 [IU] via SUBCUTANEOUS
  Administered 2018-06-19 – 2018-06-20 (×2): 2 [IU] via SUBCUTANEOUS
  Filled 2018-06-17: qty 1

## 2018-06-17 MED ORDER — METHYLPREDNISOLONE SODIUM SUCC 125 MG IJ SOLR
125.0000 mg | Freq: Once | INTRAMUSCULAR | Status: AC
  Administered 2018-06-17: 125 mg via INTRAVENOUS
  Filled 2018-06-17: qty 2

## 2018-06-17 MED ORDER — IPRATROPIUM-ALBUTEROL 0.5-2.5 (3) MG/3ML IN SOLN
3.0000 mL | Freq: Once | RESPIRATORY_TRACT | Status: AC
  Administered 2018-06-17: 3 mL via RESPIRATORY_TRACT
  Filled 2018-06-17: qty 3

## 2018-06-17 MED ORDER — ATORVASTATIN CALCIUM 40 MG PO TABS
40.0000 mg | ORAL_TABLET | Freq: Every day | ORAL | Status: DC
  Administered 2018-06-19 – 2018-06-20 (×2): 40 mg via ORAL
  Filled 2018-06-17 (×2): qty 1

## 2018-06-17 MED ORDER — ALBUTEROL (5 MG/ML) CONTINUOUS INHALATION SOLN
10.0000 mg/h | INHALATION_SOLUTION | Freq: Once | RESPIRATORY_TRACT | Status: AC
  Administered 2018-06-17: 10 mg/h via RESPIRATORY_TRACT
  Filled 2018-06-17: qty 20

## 2018-06-17 MED ORDER — MAGNESIUM SULFATE 2 GM/50ML IV SOLN
2.0000 g | Freq: Once | INTRAVENOUS | Status: AC
  Administered 2018-06-17: 2 g via INTRAVENOUS
  Filled 2018-06-17: qty 50

## 2018-06-17 MED ORDER — ACETAMINOPHEN 650 MG RE SUPP: 650.0000 mg | Freq: Four times a day (QID) | RECTAL | Status: DC | PRN

## 2018-06-17 MED ORDER — POTASSIUM CHLORIDE CRYS ER 20 MEQ PO TBCR
20.0000 meq | EXTENDED_RELEASE_TABLET | Freq: Every day | ORAL | Status: DC
  Administered 2018-06-19 – 2018-06-20 (×2): 20 meq via ORAL
  Filled 2018-06-17 (×2): qty 1

## 2018-06-17 MED ORDER — SODIUM CHLORIDE (PF) 0.9 % IJ SOLN
INTRAMUSCULAR | Status: AC
  Filled 2018-06-17: qty 50

## 2018-06-17 MED ORDER — ALBUTEROL SULFATE (2.5 MG/3ML) 0.083% IN NEBU: 2.5000 mg | INHALATION_SOLUTION | RESPIRATORY_TRACT | Status: DC | PRN

## 2018-06-17 MED ORDER — SODIUM CHLORIDE 0.9 % IV SOLN
100.0000 mg | Freq: Once | INTRAVENOUS | Status: AC
  Administered 2018-06-17: 100 mg via INTRAVENOUS
  Filled 2018-06-17: qty 100

## 2018-06-17 MED ORDER — LISINOPRIL 5 MG PO TABS
2.5000 mg | ORAL_TABLET | Freq: Every day | ORAL | Status: DC
  Administered 2018-06-17: 2.5 mg via ORAL
  Filled 2018-06-17: qty 1

## 2018-06-17 MED ORDER — IPRATROPIUM BROMIDE 0.02 % IN SOLN
1.0000 mg | Freq: Once | RESPIRATORY_TRACT | Status: AC
  Administered 2018-06-17: 1 mg via RESPIRATORY_TRACT
  Filled 2018-06-17: qty 5

## 2018-06-17 MED ORDER — METOPROLOL SUCCINATE ER 25 MG PO TB24
12.5000 mg | ORAL_TABLET | Freq: Every day | ORAL | Status: DC
  Administered 2018-06-17 – 2018-06-20 (×3): 12.5 mg via ORAL
  Filled 2018-06-17: qty 1
  Filled 2018-06-17: qty 0.5
  Filled 2018-06-17: qty 1

## 2018-06-17 MED ORDER — ACETAMINOPHEN 325 MG PO TABS: 650.0000 mg | ORAL_TABLET | Freq: Four times a day (QID) | ORAL | Status: DC | PRN

## 2018-06-17 MED ORDER — IPRATROPIUM-ALBUTEROL 0.5-2.5 (3) MG/3ML IN SOLN: 3.0000 mL | Freq: Four times a day (QID) | RESPIRATORY_TRACT | Status: DC

## 2018-06-17 MED ORDER — GUAIFENESIN ER 600 MG PO TB12
600.0000 mg | ORAL_TABLET | Freq: Two times a day (BID) | ORAL | Status: DC
  Administered 2018-06-17 – 2018-06-21 (×7): 600 mg via ORAL
  Filled 2018-06-17 (×7): qty 1

## 2018-06-17 MED ORDER — IPRATROPIUM-ALBUTEROL 0.5-2.5 (3) MG/3ML IN SOLN
3.0000 mL | Freq: Four times a day (QID) | RESPIRATORY_TRACT | Status: DC
  Administered 2018-06-17 – 2018-06-21 (×13): 3 mL via RESPIRATORY_TRACT
  Filled 2018-06-17 (×14): qty 3

## 2018-06-17 MED ORDER — VITAMIN D 25 MCG (1000 UNIT) PO TABS
1000.0000 [IU] | ORAL_TABLET | Freq: Every day | ORAL | Status: DC
  Administered 2018-06-19 – 2018-06-20 (×2): 1000 [IU] via ORAL
  Filled 2018-06-17 (×2): qty 1

## 2018-06-17 MED ORDER — PREDNISONE 20 MG PO TABS
40.0000 mg | ORAL_TABLET | Freq: Every day | ORAL | Status: DC
  Administered 2018-06-19 – 2018-06-21 (×3): 40 mg via ORAL
  Filled 2018-06-17 (×3): qty 2

## 2018-06-17 MED ORDER — IOPAMIDOL (ISOVUE-370) INJECTION 76%
100.0000 mL | Freq: Once | INTRAVENOUS | Status: AC | PRN
  Administered 2018-06-17: 100 mL via INTRAVENOUS

## 2018-06-17 MED ORDER — FUROSEMIDE 40 MG PO TABS
40.0000 mg | ORAL_TABLET | Freq: Every day | ORAL | Status: DC
  Administered 2018-06-18 – 2018-06-21 (×4): 40 mg via ORAL
  Filled 2018-06-17 (×4): qty 1

## 2018-06-17 MED ORDER — LEVOFLOXACIN IN D5W 750 MG/150ML IV SOLN
750.0000 mg | INTRAVENOUS | Status: DC
  Administered 2018-06-17 – 2018-06-19 (×3): 750 mg via INTRAVENOUS
  Filled 2018-06-17 (×3): qty 150

## 2018-06-17 MED ORDER — ALBUTEROL SULFATE (2.5 MG/3ML) 0.083% IN NEBU: 5.0000 mg | INHALATION_SOLUTION | Freq: Once | RESPIRATORY_TRACT | Status: DC

## 2018-06-17 NOTE — ED Notes (Signed)
Bed: ZO10 Expected date: 06/17/18 Expected time: 8:38 AM Means of arrival: Ambulance Comments: 85 yo [facility]

## 2018-06-17 NOTE — ED Triage Notes (Addendum)
Patient arrived via [facility] from home.  C/O shob and productive cough x 3 weeks. C/O blood in urine and sputum.   Diagnosed with bronchitis 3 weeks ago and given prednisone and albuterol. Patient states he has not gotten any better  Patient is on home 02 at 4 litters  Hx. COPD, pneumonia in December.   Right lower lobe removed.  Normally shob but, has been worse for past 3 weeks.   A/Ox4 Patient also states he has had more trouble with ADL's Needs stand by assistance when ambulating.  Patient is DNR.

## 2018-06-17 NOTE — ED Notes (Signed)
Patient transported to CT 

## 2018-06-17 NOTE — ED Notes (Signed)
Pt states "he never said he wants to kill himself" he does however states he was anxious when he took his O2 off.

## 2018-06-17 NOTE — H&P (Signed)
History and Physical    Roberto Reilly NWG:[id number] DOB: 1934/05/23 DOA: 06/17/2018  PCP: Pearson Grippe, MD   Patient coming from: Home.  I have personally briefly reviewed patient's old medical records in [facility]  Chief Complaint: Shortness of breath.  HPI: Roberto Reilly is a 85 y.o. male with medical history significant of AAA, anxiety, depression, CAD, history of MI, CABG, , diverticulosis, hyperlipidemia, history of pneumonia, hypertension, obesity, paroxysmal atrial fibrillation, COPD with chronic hypoxia and hypercapnia who is coming to the emergency department with complaints of progressively worse dyspnea and productive cough of yellowish/greenish for the past 3 weeks.  He was diagnosed with COPD exacerbation bronchitis 3 weeks ago and was given oral prednisone and albuterol.  However, he states that he has not had any improvement of the symptoms with this treatment.  He denies fever, chills, but feels fatigue and malaise.  Denies sore throat, hemoptysis, PND, orthopnea, chest pain, palpitations, diaphoresis, abdominal pain, nausea or emesis, diarrhea or constipation, melena or hematochezia.  No dysuria, frequency or hematuria.  Denies polyuria, polydipsia, polyphagia or blurred vision.  ED Course: Initial vital signs temperature 97.6 F, pulse 63, respirations 21, blood pressure 136/73 mmHg and O2 sat 98% on room air.  The patient was given supplemental oxygen, bronchodilators, magnesium sulfate 2 g IVPB, doxycycline 100 mg IVPB x1 and 125 mg of IV Solu-Medrol x1.  White count was 9.4, hemoglobin 12.7 g/dL and platelets 578.  PT was 19.9 seconds and INR 1.71.  BMP shows a sodium 142, potassium 4.0, chloride of 97 and CO2 of 39 mmol/L.  BUN was 34, creatinine 1.1, glucose 93 and calcium 8.7 mg/dL. Troponin was mildly elevated 0.03 ng/mL.  BNP was 167.6 pg/mL.   EKG atrial flutter, with RBBB and LP FB, which is not significantly changed when compared to previous  tracings.  Imaging: His chest radiograph was normal.  CTA did not show PE, but showed increase of groundglass opacity in the right upper lobe.  CT chest in 6 months is recommended.  Please see images and full radiology report for further detail.  Review of Systems: As per HPI otherwise 10 point review of systems negative.   Past Medical History:  Diagnosis Date  . AAA (abdominal aortic aneurysm) (HCC)    4.2 X 4.8 cm 04/2013 CT; declined re-eval 02/25/15  . Anxiety   . CAD in native artery March 2007   Cath for dyspnea on exertion: 75% distal LM, 85% RI, 95% mid-distal Cx, in multiple RCA lesions with 95% distal. --> Referred for CABG; has declined further noninvasive evaluation in the absence of worsening symptoms  . COPD (chronic obstructive pulmonary disease) with emphysema (HCC) 05/23/2007   Hosp 5/29-6/01/12- COPD exacerbation ONOX 12/08/10- desaturated to less than 88% for over an hour, qualifying for home O2 during sleep    . Diverticulosis of colon with hemorrhage 05/24/2013  . Dyslipidemia, goal LDL below 70   . History of non-ST Elevation MI (myocardial infarction) March 2007   Admitted with COPD exacerbation, given by CHF. Had some chest pain with positive troponins. Cath showed multivessel disease, referred for CABG  . History of pneumonia   . Hypertension   . Hypoxia     chronic, on home O2  . Obesity (BMI 30-39.9)    One year ago weighed 265 pounds, (12/2012) -- now 234 pounds  . PAF (paroxysmal atrial fibrillation) (HCC)    Anticoagulated on warfarin. Stable -- post op  . Pneumonia    hx  .  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 Seasonal allergies   . Shortness of breath dyspnea     Past Surgical History:  Procedure Laterality Date  . ACHILLES TENDON LENGTHENING Right 10/08/2015   Procedure: ACHILLES TENDON LENGTHENING;  Surgeon: Toni Arthurs, MD;  Location: [facility] OR;  Service: Orthopedics;  Laterality: Right;  . AMPUTATION Right 10/08/2015    Procedure: RIGHT FOOT TRANSMET AMPUTATION;  Surgeon: Toni Arthurs, MD;  Location: [facility] OR;  Service: Orthopedics;  Laterality: Right;  . CARDIAC CATHETERIZATION  03 28 2007   NORMAL LV FUNCTION/ ABDOMINAL AORTA STENOSIS,75%-85%. RIGHT FEMORAL ARTERY :CATHETERS USED A  4-FRENCH WITH A 4-FRENCH SHEATH  . CATARACT EXTRACTION     Laser  . COLONOSCOPY N/A 05/24/2013   Procedure: COLONOSCOPY;  Surgeon: Iva Boop, MD;  Location: [facility] ENDOSCOPY;  Service: Endoscopy;  Laterality: N/A;  . CORONARY ARTERY BYPASS GRAFT    . RLL resection for Hamartoma     lungs  . RUL for hamartoma     lung  . TRANSTHORACIC ECHOCARDIOGRAM  12/2016   Normal LV size and function.  EF 60-65% - no RWMA.  Mild RA dilation     reports that he quit smoking about 20 years ago. His smoking use included cigarettes. He has a 110.00 pack-year smoking history. He has never used smokeless tobacco. He reports that he does not drink alcohol or use drugs.  No Known Allergies  Family History  Problem Relation Age of Onset  . Heart attack Mother   . Heart attack Father    Prior to Admission medications   Medication Sig Start Date End Date Taking? Authorizing Provider  amLODipine (NORVASC) 10 MG tablet Take 1 tablet (10 mg total) by mouth daily. 05/01/13  Yes Dorothea Ogle, MD  aspirin EC 81 MG tablet Take 81 mg by mouth every morning.    Yes [provider]  atorvastatin (LIPITOR) 40 MG tablet Take 1 tablet (40 mg total) by mouth daily. 11/20/17  Yes Marykay Lex, MD  budesonide-formoterol [facility]) 160-4.5 MCG/ACT inhaler Inhale 2 puffs into the lungs 2 (two) times daily. 12/07/17  Yes Young, Joni Fears D, MD  cholecalciferol (VITAMIN D) 1000 UNITS tablet Take 1,000 Units by mouth every morning.    Yes [provider]  furosemide (LASIX) 40 MG tablet Take 1 tablet (40 mg total) daily by mouth. May take an extra 40 mg tablet daily if weight is above 213 lbs ,if needed 04/03/17  Yes Marykay Lex, MD   guaiFENesin (MUCINEX) 600 MG 12 hr tablet Take 1 tablet (600 mg total) by mouth 2 (two) times daily. 06/10/18  Yes Alwyn Ren, MD  ipratropium-albuterol (DUONEB) 0.5-2.5 (3) MG/3ML SOLN Take 3 mLs by nebulization every 4 (four) hours as needed (wheezing and shortness of breath).    Yes [provider]  potassium chloride SA (K-DUR,KLOR-CON) 20 MEQ tablet Take 1 tablet (20 mEq total) by mouth daily. 05/07/18  Yes Marykay Lex, MD  predniSONE (DELTASONE) 10 MG tablet Take 40 mg that is 4 tablets daily for the first 4 days then 3 tablets daily for the following 4 days then 2 tablets daily for the following 4 days and then 1 tablet daily till done. Patient taking differently: Take 10-40 mg by mouth See admin instructions. Take 40 mg by mouth daily for the first 4 days then 30 mg daily for the following 4 days then 20 mg daily for the following 4  days and then 10 mg daily till done. 06/10/18  Yes Alwyn Ren, MD  warfarin (COUMADIN) 2.5 MG tablet TAKE ONE TO TWO TABLETS BY MOUTH AS INSTRUCTED BY COUMADIN CLINIC Patient taking differently: Take 2.5 mg by mouth See admin instructions. Take 2.5 mg by mouth in the morning on Sunday,Tuesday and Thursday. Take 2.5 mg by mouth twice daily at 0830 and 1600 on Monday, Wednesday, Friday, and Saturday 01/30/18  Yes Marykay Lex, MD  doxycycline (VIBRA-TABS) 100 MG tablet Take 1 tablet (100 mg total) by mouth every 12 (twelve) hours. Patient not taking: Reported on 06/17/2018 06/09/18   Alwyn Ren, MD    Physical Exam: Vitals:   06/17/18 1430 06/17/18 1500 06/17/18 1530 06/17/18 1558  BP: (!) 162/80 (!) 154/69 (!) 156/66   Pulse: 78 80 78 82  Resp: 19 16 20  (!) 26  Temp:      TempSrc:      SpO2: 95% 93% 97% 91%    Constitutional: Looks chronically ill, but currently in NAD, calm, comfortable Eyes: PERRL, lids and conjunctivae are mildly injected ENMT: Mucous membranes are mildly dry. Posterior pharynx clear of any  exudate or lesions.  Absent dentition. Neck: normal, supple, no masses, no thyromegaly Respiratory: Decreased breath sounds with bilateral rhonchi and wheezing, no crackles. Normal respiratory effort. No accessory muscle use.  Cardiovascular: Regular rate and rhythm, no murmurs / rubs / gallops. 1+ bilateral lower extremity pitting edema. 2+ pedal pulses. No carotid bruits.  Abdomen: Obese, soft, no tenderness, no masses palpated. No hepatosplenomegaly. Bowel sounds positive.  Musculoskeletal: no clubbing / cyanosis. No no gross joint deformity upper and lower extremities. Good ROM, no contractures. Normal muscle tone.  Skin: no rashes, lesions, ulcers. No induration Neurologic: CN 2-12 grossly intact. Sensation intact, DTR normal. Strength 5/5 in all 4.  Psychiatric: Normal judgment and insight. Alert and oriented x 3. Normal mood.   Labs on Admission: I have personally reviewed following labs and imaging studies  CBC: Recent Labs  Lab 06/17/18 0903  WBC 9.4  HGB 12.7*  HCT 42.3  MCV 95.1  PLT 214   Basic Metabolic Panel: Recent Labs  Lab 06/17/18 0903  NA 142  K 4.0  CL 97*  CO2 39*  GLUCOSE 93  BUN 34*  CREATININE 1.10  CALCIUM 8.7*   GFR: Estimated Creatinine Clearance: 56.8 mL/min (by C-G formula based on SCr of 1.1 mg/dL). Liver Function Tests: No results for input(s): AST, ALT, ALKPHOS, BILITOT, PROT, ALBUMIN in the last 168 hours. No results for input(s): LIPASE, AMYLASE in the last 168 hours. No results for input(s): AMMONIA in the last 168 hours. Coagulation Profile: Recent Labs  Lab 06/17/18 0907  INR 1.71   Cardiac Enzymes: Recent Labs  Lab 06/17/18 0907  TROPONINI 0.03*   BNP (last 3 results) No results for input(s): PROBNP in the last 8760 hours. HbA1C: No results for input(s): HGBA1C in the last 72 hours. CBG: No results for input(s): GLUCAP in the last 168 hours. Lipid Profile: No results for input(s): CHOL, HDL, LDLCALC, TRIG, CHOLHDL,  LDLDIRECT in the last 72 hours. Thyroid Function Tests: No results for input(s): TSH, T4TOTAL, FREET4, T3FREE, THYROIDAB in the last 72 hours. Anemia Panel: No results for input(s): VITAMINB12, FOLATE, FERRITIN, TIBC, IRON, RETICCTPCT in the last 72 hours. Urine analysis:    Component Value Date/Time   COLORURINE YELLOW 06/07/2018 1256   APPEARANCEUR CLEAR 06/07/2018 1256   LABSPEC >1.046 (H) 06/07/2018 1256   PHURINE 7.0 06/07/2018  1256   GLUCOSEU 50 (A) 06/07/2018 1256   HGBUR NEGATIVE 06/07/2018 1256   BILIRUBINUR NEGATIVE 06/07/2018 1256   KETONESUR NEGATIVE 06/07/2018 1256   PROTEINUR NEGATIVE 06/07/2018 1256   UROBILINOGEN 0.2 10/19/2010 1150   NITRITE NEGATIVE 06/07/2018 1256   LEUKOCYTESUR NEGATIVE 06/07/2018 1256    Radiological Exams on Admission: Ct Angio Chest Pe W And/or Wo Contrast  Result Date: 06/17/2018 CLINICAL DATA:  Shortness of breath and productive cough for the past 3 weeks. History of COPD. EXAM: CT ANGIOGRAPHY CHEST WITH CONTRAST TECHNIQUE: Multidetector CT imaging of the chest was performed using the standard protocol during bolus administration of intravenous contrast. Multiplanar CT image reconstructions and MIPs were obtained to evaluate the vascular anatomy. CONTRAST:  100mL ISOVUE-370 IOPAMIDOL (ISOVUE-370) INJECTION 76% COMPARISON:  Portable chest obtained today. Chest CT dated 06/07/2018. FINDINGS: Cardiovascular: Atheromatous calcifications, including the coronary arteries and aorta. Normally opacified pulmonary arteries with no pulmonary arterial filling defects seen. Stable dilated main pulmonary artery. Mediastinum/Nodes: No enlarged mediastinal, hilar, or axillary lymph nodes. Thyroid gland, trachea, and esophagus demonstrate no significant findings. Lungs/Pleura: The previously demonstrated 1.8 x 1.8 cm ground-glass opacity in the right upper lobe is slightly larger, currently measuring 2.2 x 1.6 cm on image number 51 of series 7. Post right lower  lobectomy changes are again demonstrated with slightly less prominent density in the proximal right middle lobe bronchus, with scattered air within this area. The previously demonstrated chronic 7 mm right posterior basal right upper lobe is unchanged on image number 93 series 7 and previously shown stable since 2014, compatible with a benign process. Mild tree in bud opacities in that portion of the lung have not changed significantly. No pleural fluid. Upper Abdomen: Unremarkable. Musculoskeletal: Thoracic spine degenerative changes. Old bilateral rib fractures. Review of the MIP images confirms the above findings. IMPRESSION: 1. No pulmonary emboli or acute abnormality. 2. Mild increase in size of a ground-glass opacity in the right upper lobe, currently measuring 2.2 x 1.6 cm. A follow-up chest CT is recommended in 6 months. 3. Stable mild chronic tree in bud opacities in the basilar portion of the right upper lobe, suspicious for a mild active infectious process. 4. Stable dilated main pulmonary artery compatible with pulmonary arterial hypertension. 5. Slightly improved probable mucous plugging in the right middle lobe bronchus. 6. Status post right lower lobectomy. 7. Calcific coronary artery and aortic atherosclerosis. Aortic Atherosclerosis (ICD10-I70.0). Electronically Signed   By: Beckie SaltsSteven  Reid M.D.   On: 06/17/2018 11:27   Dg Chest Portable 1 View  Result Date: 06/17/2018 CLINICAL DATA:  Shortness of breath EXAM: PORTABLE CHEST 1 VIEW COMPARISON:  06/07/2018 FINDINGS: Previous median sternotomy with CABG procedure. Heart size appears normal. There are postsurgical changes and volume loss are from previous right lower lobectomy. No superimposed airspace consolidation. No pleural effusion or edema. IMPRESSION: 1. No acute cardiopulmonary abnormalities. Electronically Signed   By: Signa Kellaylor  Stroud M.D.   On: 06/17/2018 09:36    EKG: Independently reviewed.  Vent. rate 67 BPM PR interval * ms QRS  duration 153 ms QT/QTc 430/454 ms P-R-T axes * 115 67 Atrial flutter RBBB and LPFB No significant change when compared to previous.  Assessment/Plan Principal Problem:   Acute exacerbation of chronic obstructive pulmonary disease (COPD) (HCC) Observation/telemetry. Continue supplemental oxygen. DuoNeb every 6 hours. Albuterol nebs every 4 hour as needed. Continue Solu-Medrol 40 mg IVP every 6 hours. Switch to oral prednisone in a.m. if clinically improved.  Active Problems:   Hypercapnia Due  to COPD. Add Diamox 250 mg p.o. twice daily.    Coronary atherosclerosis Continue atorvastatin. Continue low-dose aspirin and warfarin. Start low-dose metoprolol.    Paroxysmal atrial fibrillation (HCC);  CHA2DS2-VASc Score = 4.  Warfarin per pharmacy. Start low-dose metoprolol succinate.    Dyslipidemia, goal LDL below 70 Continue atorvastatin 40 mg p.o. daily. Monitor LFTs as needed. Fasting lipid profile follow-up per primary.    Essential hypertension Home furosemide 40 mg p.o. daily. We will start low-dose lisinopril and metoprolol succinate for CHF. Monitor BP, HR, renal function electrolytes    Hyperglycemia Carbohydrate modified diet. CBG monitoring with RI SS while on glucocorticoids.    Chronic diastolic CHF (congestive heart failure), NYHA class 2 (HCC) Continue furosemide 40 mg p.o. daily. Start low-dose ACE inhibitor. Start low-dose metoprolol succinate.    DVT prophylaxis: On warfarin. Code Status: DNR. Family Communication: Disposition Plan: Observation for 24 to 48 hours for COPD exacerbation treatment. Consults called: Admission status: Observation/telemetry.   Bobette Moavid Manuel Lino Wickliff MD [facility]  06/17/2018, 4:58 PM   This document was prepared using Dragon voice recognition software and may contain some unintended transcription errors.

## 2018-06-17 NOTE — ED Notes (Signed)
Respiratory has been called. Line busy. Secretary will try.

## 2018-06-17 NOTE — ED Notes (Addendum)
Patient 02 dropped down to 84%. Patient is on 4 litters nasal canula. Patient encouraged to breath through nose and out mouth. Patient still breathing through mouth. Patient changed to mask 02. Patient is now refusing mask saying that it makes his breathing worse. 02 turned up from 4 to 5. Patient now at 87% on Nasal canula at 5 litters. Patient repositioned in the bed sitting up 82 degrees.

## 2018-06-17 NOTE — ED Notes (Addendum)
Patient called out and stated, "He is not wearing his oxygen anymore because he wants to die". Will continue to monitor. Encouraged patient to wear [place]. 90% on RA at this time. Ortiz,MD paged. Jari Favre, RN made aware.

## 2018-06-17 NOTE — ED Notes (Signed)
Pharmacy contacted for Doxycycline.

## 2018-06-17 NOTE — ED Provider Notes (Signed)
[facility]-EMERGENCY DEPT Provider Note   CSN: [id number] Arrival date & time: 06/17/18  0845     History   Chief Complaint Chief Complaint  Patient presents with  . Shortness of Breath    HPI Roberto Reilly is a 85 y.o. male.  HPI   85 yo M with extensive PMHx including severe COPD, CAD, pAFIb on coumadin, here with SOB. Pt reports that for the past several days, he's had progressively worsening SOB. He has recently been hospitalized with PNA, COPD and states he initially felt better, but over the past several days has had progressively worsening SOB with cough, sputum production, and weakness. He has been unable to move around his own house 2/2 this SOB. He's had poor appetite, weakness as well. Has been on steroids and using inhalers w/o relief. He's had some mild swelling b/l legs but this is chronic. No CP. Denies any alleviating factors. SOB worse with any amount of exertion.   Past Medical History:  Diagnosis Date  . AAA (abdominal aortic aneurysm) (HCC)    4.2 X 4.8 cm 04/2013 CT; declined re-eval 02/25/15  . Anxiety   . CAD in native artery March 2007   Cath for dyspnea on exertion: 75% distal LM, 85% RI, 95% mid-distal Cx, in multiple RCA lesions with 95% distal. --> Referred for CABG; has declined further noninvasive evaluation in the absence of worsening symptoms  . COPD (chronic obstructive pulmonary disease) with emphysema (HCC) 05/23/2007   Hosp 5/29-6/01/12- COPD exacerbation ONOX 12/08/10- desaturated to less than 88% for over an hour, qualifying for home O2 during sleep    . Diverticulosis of colon with hemorrhage 05/24/2013  . Dyslipidemia, goal LDL below 70   . History of non-ST Elevation MI (myocardial infarction) March 2007   Admitted with COPD exacerbation, given by CHF. Had some chest pain with positive troponins. Cath showed multivessel disease, referred for CABG  . History of pneumonia   . Hypertension   . Hypoxia     chronic, on home  O2  . Obesity (BMI 30-39.9)    One year ago weighed 265 pounds, (12/2012) -- now 234 pounds  . PAF (paroxysmal atrial fibrillation) (HCC)    Anticoagulated on warfarin. Stable -- post op  . Pneumonia    hx  . 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 Seasonal allergies   . Shortness of breath dyspnea     Patient Active Problem List   Diagnosis Date Noted  . Acute exacerbation of chronic obstructive pulmonary disease (COPD) (HCC) 06/07/2018  . Community acquired pneumonia 05/16/2018  . Counseling regarding end of life decision making 09/22/2017  . Acute respiratory failure with hypoxia (HCC) 02/21/2017  . Acute on chronic congestive heart failure (HCC) 02/21/2017  . Acute respiratory failure (HCC) 02/21/2017  . Chronic diastolic CHF (congestive heart failure), NYHA class 2 (HCC) 01/17/2017  . Acute bronchitis due to respiratory syncytial virus (RSV) 05/20/2016  . Respiratory failure (HCC) 05/19/2016  . Shortness of breath 05/18/2016  . Chronic renal failure syndrome, stage 3 (moderate) (HCC) 05/18/2016  . Hypercapnia 05/18/2016  . Hyperglycemia   . Chronic osteomyelitis of right foot (HCC) 02/05/2016  . Acute and chronic respiratory failure with hypoxia (HCC)   . Abnormal TSH 11/03/2015  . Pleural effusion 11/01/2015  . Acute renal failure (ARF) (HCC) 11/01/2015  . Sepsis (HCC) 11/01/2015  . Pneumonia 11/01/2015  . COPD exacerbation (HCC) 11/01/2015  . Encounter for long-term (current)  use of other medications 01/07/2014  . Nasal sinus congestion 01/07/2014  . Diverticulosis of colon with hemorrhage 05/24/2013  . Internal hemorrhoids 05/24/2013  . AAA (abdominal aortic aneurysm) without rupture seen on CT scan 05/22/2013  . Rectal bleeding 05/21/2013  . Anemia 05/20/2013  . Generalized anxiety disorder 05/09/2013  . Auditory hallucination 05/09/2013  . COPD mixed type (HCC) 05/07/2013  . COPD with acute exacerbation (HCC) 05/07/2013  . Chest  discomfort 05/07/2013  . Hx of scabies 04/29/2013  . Essential hypertension 04/29/2013  . Obesity (BMI 30-39.9) 01/18/2013  . Paroxysmal atrial fibrillation (HCC); CHA2DS2-VASc Score = 4. On Warfarin   . Dyslipidemia, goal LDL below 70   . Reactive depression (situational) 09/06/2011  . Coronary atherosclerosis 05/23/2007  . CAD in native artery - LM, RCA, RI & Cx disease --> CABG x 4 (LIMA-LAD, SVG-OM/RI, SVG-rPDA-rPL) 07/21/2005    Class: History of  . S/P CABG x 4 07/21/2005    Past Surgical History:  Procedure Laterality Date  . ACHILLES TENDON LENGTHENING Right 10/08/2015   Procedure: ACHILLES TENDON LENGTHENING;  Surgeon: Toni Arthurs, MD;  Location: [facility] OR;  Service: Orthopedics;  Laterality: Right;  . AMPUTATION Right 10/08/2015   Procedure: RIGHT FOOT TRANSMET AMPUTATION;  Surgeon: Toni Arthurs, MD;  Location: [facility] OR;  Service: Orthopedics;  Laterality: Right;  . CARDIAC CATHETERIZATION  03 28 2007   NORMAL LV FUNCTION/ ABDOMINAL AORTA STENOSIS,75%-85%. RIGHT FEMORAL ARTERY :CATHETERS USED A  4-FRENCH WITH A 4-FRENCH SHEATH  . CATARACT EXTRACTION     Laser  . COLONOSCOPY N/A 05/24/2013   Procedure: COLONOSCOPY;  Surgeon: Iva Boop, MD;  Location: [facility] END[facility]COPY;  Service: Endoscopy;  Laterality: N/A;  . CORONARY ARTERY BYPASS GRAFT    . RLL resection for Hamartoma     lungs  . RUL for hamartoma     lung  . TRANSTHORACIC ECHOCARDIOGRAM  12/2016   Normal LV size and function.  EF 60-65% - no RWMA.  Mild RA dilation        Home Medications    Prior to Admission medications   Medication Sig Start Date End Date Taking? Authorizing Provider  amLODipine (NORVASC) 10 MG tablet Take 1 tablet (10 mg total) by mouth daily. 05/01/13  Yes Dorothea Ogle, MD  aspirin EC 81 MG tablet Take 81 mg by mouth every morning.    Yes [provider]  atorvastatin (LIPITOR) 40 MG tablet Take 1 tablet (40 mg total) by mouth daily. 11/20/17  Yes Marykay Lex, MD  budesonide-formoterol  [facility]) 160-4.5 MCG/ACT inhaler Inhale 2 puffs into the lungs 2 (two) times daily. 12/07/17  Yes Young, Joni Fears D, MD  cholecalciferol (VITAMIN D) 1000 UNITS tablet Take 1,000 Units by mouth every morning.    Yes [provider]  furosemide (LASIX) 40 MG tablet Take 1 tablet (40 mg total) daily by mouth. May take an extra 40 mg tablet daily if weight is above 213 lbs ,if needed 04/03/17  Yes Marykay Lex, MD  guaiFENesin (MUCINEX) 600 MG 12 hr tablet Take 1 tablet (600 mg total) by mouth 2 (two) times daily. 06/10/18  Yes Alwyn Ren, MD  ipratropium-albuterol (DUONEB) 0.5-2.5 (3) MG/3ML SOLN Take 3 mLs by nebulization every 4 (four) hours as needed (wheezing and shortness of breath).    Yes [provider]  potassium chloride SA (K-DUR,KLOR-CON) 20 MEQ tablet Take 1 tablet (20 mEq total) by mouth daily. 05/07/18  Yes Marykay Lex, MD  predniSONE (DELTASONE) 10 MG  tablet Take 40 mg that is 4 tablets daily for the first 4 days then 3 tablets daily for the following 4 days then 2 tablets daily for the following 4 days and then 1 tablet daily till done. Patient taking differently: Take 10-40 mg by mouth See admin instructions. Take 40 mg by mouth daily for the first 4 days then 30 mg daily for the following 4 days then 20 mg daily for the following 4 days and then 10 mg daily till done. 06/10/18  Yes Alwyn Ren, MD  warfarin (COUMADIN) 2.5 MG tablet TAKE ONE TO TWO TABLETS BY MOUTH AS INSTRUCTED BY COUMADIN CLINIC Patient taking differently: Take 2.5 mg by mouth See admin instructions. Take 2.5 mg by mouth in the morning on Sunday,Tuesday and Thursday. Take 2.5 mg by mouth twice daily at 0830 and 1600 on Monday, Wednesday, Friday, and Saturday 01/30/18  Yes Marykay Lex, MD  doxycycline (VIBRA-TABS) 100 MG tablet Take 1 tablet (100 mg total) by mouth every 12 (twelve) hours. Patient not taking: Reported on 06/17/2018 06/09/18   Alwyn Ren, MD     Family History Family History  Problem Relation Age of Onset  . Heart attack Mother   . Heart attack Father     Social History Social History   Tobacco Use  . Smoking status: Former Smoker    Packs/day: 2.00    Years: 55.00    Pack years: 110.00    Types: Cigarettes    Last attempt to quit: 05/23/1998    Years since quitting: 20.0  . Smokeless tobacco: Never Used  Substance Use Topics  . Alcohol use: No  . Drug use: No     Allergies   Patient has no known allergies.   Review of Systems Review of Systems  Constitutional: Positive for fatigue. Negative for chills and fever.  HENT: Negative for congestion and rhinorrhea.   Eyes: Negative for visual disturbance.  Respiratory: Positive for cough and shortness of breath. Negative for wheezing.   Cardiovascular: Negative for chest pain and leg swelling.  Gastrointestinal: Negative for abdominal pain, diarrhea, nausea and vomiting.  Genitourinary: Negative for dysuria and flank pain.  Musculoskeletal: Negative for neck pain and neck stiffness.  Skin: Negative for rash and wound.  Allergic/Immunologic: Negative for immunocompromised state.  Neurological: Positive for weakness. Negative for syncope and headaches.  All other systems reviewed and are negative.    Physical Exam Updated Vital Signs BP (!) 141/67   Pulse 80   Temp 97.6 F (36.4 C) (Oral)   Resp (!) 24   SpO2 96%   Physical Exam Vitals signs and nursing note reviewed.  Constitutional:      General: He is not in acute distress.    Appearance: He is well-developed.     Comments: Elderly, in mild resp distress, speaking in 2-3 word sentences  HENT:     Head: Normocephalic and atraumatic.  Eyes:     Conjunctiva/sclera: Conjunctivae normal.  Neck:     Musculoskeletal: Neck supple.  Cardiovascular:     Rate and Rhythm: Normal rate and regular rhythm.     Heart sounds: Normal heart sounds. No murmur. No friction rub.  Pulmonary:     Effort:  Pulmonary effort is normal. Tachypnea present. No respiratory distress.     Breath sounds: Examination of the right-upper field reveals wheezing. Examination of the left-upper field reveals wheezing. Examination of the right-middle field reveals wheezing. Examination of the left-middle field reveals wheezing. Examination of the  right-lower field reveals wheezing. Examination of the left-lower field reveals wheezing. Decreased breath sounds and wheezing present.  Abdominal:     General: There is no distension.     Palpations: Abdomen is soft.     Tenderness: There is no abdominal tenderness.  Musculoskeletal:     Right lower leg: Edema present.     Left lower leg: Edema present.  Skin:    General: Skin is warm.     Capillary Refill: Capillary refill takes less than 2 seconds.  Neurological:     Mental Status: He is alert and oriented to person, place, and time.     Motor: No abnormal muscle tone.      ED Treatments / Results  Labs (all labs ordered are listed, but only abnormal results are displayed) Labs Reviewed  BASIC METABOLIC PANEL - Abnormal; Notable for the following components:      Result Value   Chloride 97 (*)    CO2 39 (*)    BUN 34 (*)    Calcium 8.7 (*)    All other components within normal limits  CBC - Abnormal; Notable for the following components:   Hemoglobin 12.7 (*)    All other components within normal limits  TROPONIN I - Abnormal; Notable for the following components:   Troponin I 0.03 (*)    All other components within normal limits  BRAIN NATRIURETIC PEPTIDE - Abnormal; Notable for the following components:   B Natriuretic Peptide 167.6 (*)    All other components within normal limits  PROTIME-INR - Abnormal; Notable for the following components:   Prothrombin Time 19.9 (*)    All other components within normal limits  CBG MONITORING, ED - Abnormal; Notable for the following components:   Glucose-Capillary 244 (*)    All other components within  normal limits  TROPONIN I  TROPONIN I  CBC WITH DIFFERENTIAL/PLATELET  PROTIME-INR    EKG EKG Interpretation  Date/Time:  Sunday June 17 2018 08:50:21 EST Ventricular Rate:  67 PR Interval:    QRS Duration: 153 QT Interval:  430 QTC Calculation: 454 R Axis:   115 Text Interpretation:  Atrial flutter RBBB and LPFB No significant change since last tracing Confirmed by Shaune PollackIsaacs, Lilburn Straw [phone]) on 06/17/2018 8:59:07 AM Also confirmed by Shaune PollackIsaacs, Tomas Schamp [phone]), editor Sheppard EvensSimpson, Miranda ([phone])  on 06/17/2018 11:29:32 AM   Radiology Ct Angio Chest Pe W And/or Wo Contrast  Result Date: 06/17/2018 CLINICAL DATA:  Shortness of breath and productive cough for the past 3 weeks. History of COPD. EXAM: CT ANGIOGRAPHY CHEST WITH CONTRAST TECHNIQUE: Multidetector CT imaging of the chest was performed using the standard protocol during bolus administration of intravenous contrast. Multiplanar CT image reconstructions and MIPs were obtained to evaluate the vascular anatomy. CONTRAST:  100mL ISOVUE-370 IOPAMIDOL (ISOVUE-370) INJECTION 76% COMPARISON:  Portable chest obtained today. Chest CT dated 06/07/2018. FINDINGS: Cardiovascular: Atheromatous calcifications, including the coronary arteries and aorta. Normally opacified pulmonary arteries with no pulmonary arterial filling defects seen. Stable dilated main pulmonary artery. Mediastinum/Nodes: No enlarged mediastinal, hilar, or axillary lymph nodes. Thyroid gland, trachea, and esophagus demonstrate no significant findings. Lungs/Pleura: The previously demonstrated 1.8 x 1.8 cm ground-glass opacity in the right upper lobe is slightly larger, currently measuring 2.2 x 1.6 cm on image number 51 of series 7. Post right lower lobectomy changes are again demonstrated with slightly less prominent density in the proximal right middle lobe bronchus, with scattered air within this area. The previously demonstrated chronic 7 mm right posterior  basal right upper lobe is  unchanged on image number 93 series 7 and previously shown stable since 2014, compatible with a benign process. Mild tree in bud opacities in that portion of the lung have not changed significantly. No pleural fluid. Upper Abdomen: Unremarkable. Musculoskeletal: Thoracic spine degenerative changes. Old bilateral rib fractures. Review of the MIP images confirms the above findings. IMPRESSION: 1. No pulmonary emboli or acute abnormality. 2. Mild increase in size of a ground-glass opacity in the right upper lobe, currently measuring 2.2 x 1.6 cm. A follow-up chest CT is recommended in 6 months. 3. Stable mild chronic tree in bud opacities in the basilar portion of the right upper lobe, suspicious for a mild active infectious process. 4. Stable dilated main pulmonary artery compatible with pulmonary arterial hypertension. 5. Slightly improved probable mucous plugging in the right middle lobe bronchus. 6. Status post right lower lobectomy. 7. Calcific coronary artery and aortic atherosclerosis. Aortic Atherosclerosis (ICD10-I70.0). Electronically Signed   By: Beckie Salts M.D.   On: 06/17/2018 11:27   Dg Chest Portable 1 View  Result Date: 06/17/2018 CLINICAL DATA:  Shortness of breath EXAM: PORTABLE CHEST 1 VIEW COMPARISON:  06/07/2018 FINDINGS: Previous median sternotomy with CABG procedure. Heart size appears normal. There are postsurgical changes and volume loss are from previous right lower lobectomy. No superimposed airspace consolidation. No pleural effusion or edema. IMPRESSION: 1. No acute cardiopulmonary abnormalities. Electronically Signed   By: Signa Kell M.D.   On: 06/17/2018 09:36    Procedures .Critical Care Performed by: Shaune Pollack, MD Authorized by: Shaune Pollack, MD   Critical care provider statement:    Critical care time (minutes):  35   Critical care time was exclusive of:  Separately billable procedures and treating other patients and teaching time   Critical care was time  spent personally by me on the following activities:  Development of treatment plan with patient or surrogate, discussions with consultants, evaluation of patient's response to treatment, examination of patient, obtaining history from patient or surrogate, ordering and performing treatments and interventions, ordering and review of laboratory studies, ordering and review of radiographic studies, pulse oximetry, re-evaluation of patient's condition and review of old charts   I assumed direction of critical care for this patient from another provider in my specialty: no     (including critical care time)  Medications Ordered in ED Medications  0.9 %  sodium chloride infusion (1,000 mLs Intravenous New Bag/Given 06/17/18 1051)  sodium chloride (PF) 0.9 % injection (has no administration in time range)  iopamidol (ISOVUE-370) 76 % injection (has no administration in time range)  acetaminophen (TYLENOL) tablet 650 mg (has no administration in time range)    Or  acetaminophen (TYLENOL) suppository 650 mg (has no administration in time range)  ondansetron (ZOFRAN) tablet 4 mg (has no administration in time range)    Or  ondansetron (ZOFRAN) injection 4 mg (has no administration in time range)  albuterol (PROVENTIL) (2.5 MG/3ML) 0.083% nebulizer solution 2.5 mg (has no administration in time range)  guaiFENesin (MUCINEX) 12 hr tablet 600 mg (600 mg Oral Given 06/17/18 1424)  insulin aspart (novoLOG) injection 0-15 Units (5 Units Subcutaneous Given 06/17/18 1707)  methylPREDNISolone sodium succinate (SOLU-MEDROL) 40 mg/mL injection 40 mg (40 mg Intravenous Given 06/17/18 1649)    Followed by  predniSONE (DELTASONE) tablet 40 mg (has no administration in time range)  ipratropium-albuterol (DUONEB) 0.5-2.5 (3) MG/3ML nebulizer solution 3 mL (3 mLs Nebulization Given 06/17/18 1557)  levofloxacin (LEVAQUIN) IVPB 750  mg (0 mg Intravenous Stopped 06/17/18 1844)  acetaZOLAMIDE (DIAMOX) tablet 250 mg (250 mg Oral  Given 06/17/18 1728)  lisinopril (PRINIVIL,ZESTRIL) tablet 2.5 mg (2.5 mg Oral Given 06/17/18 1728)  metoprolol succinate (TOPROL-XL) 24 hr tablet 12.5 mg (12.5 mg Oral Given 06/17/18 1727)  Warfarin - Pharmacist Dosing Inpatient ( Does not apply Not Given 06/17/18 1844)  albuterol (PROVENTIL,VENTOLIN) solution continuous neb (10 mg/hr Nebulization Given 06/17/18 0924)  ipratropium (ATROVENT) nebulizer solution 1 mg (1 mg Nebulization Given 06/17/18 0924)  magnesium sulfate IVPB 2 g 50 mL (0 g Intravenous Stopped 06/17/18 1028)  methylPREDNISolone sodium succinate (SOLU-MEDROL) 125 mg/2 mL injection 125 mg (125 mg Intravenous Given 06/17/18 0917)  doxycycline (VIBRAMYCIN) 100 mg in sodium chloride 0.9 % 250 mL IVPB (0 mg Intravenous Stopped 06/17/18 1346)  iopamidol (ISOVUE-370) 76 % injection 100 mL (100 mLs Intravenous Contrast Given 06/17/18 1101)  ipratropium-albuterol (DUONEB) 0.5-2.5 (3) MG/3ML nebulizer solution 3 mL (3 mLs Nebulization Given 06/17/18 1206)  warfarin (COUMADIN) tablet 5 mg (5 mg Oral Given 06/17/18 1843)     Initial Impression / Assessment and Plan / ED Course  I have reviewed the triage vital signs and the nursing notes.  Pertinent labs & imaging results that were available during my care of the patient were reviewed by me and considered in my medical decision making (see chart for details).     85 yo M here w/ acute on chronic SOB, increased WOB. Diffuse wheezing, tachypnea on exam. Suspect recurrent COPD exacerbation. He is DNR and claustrophobic, declining BIPAP. Pt started on continuous nebs, steroids, mag.   Trop above baseline, BNP elevated. Given his recent hospitalization with report of leg swelling/pain and persistent resp failure, CT Angio obtained and is negative. He is improving clinically but prognosis remains guarded. Admit to step down  Final Clinical Impressions(s) / ED Diagnoses   Final diagnoses:  COPD exacerbation (HCC)  Acute on chronic respiratory  failure with hypoxia (HCC)  Elevated troponin    ED Discharge Orders    None       Shaune Pollack, MD 06/17/18 1910

## 2018-06-17 NOTE — ED Notes (Signed)
Date and time results received: 06/17/18 1017 (use smartphrase ".now" to insert current time)  Test: troponin Critical Value: 0.03  Name of Provider Notified: dr. Erma Heritage  Orders Received? Or Actions Taken?:

## 2018-06-17 NOTE — ED Notes (Signed)
Patient given urinal to void

## 2018-06-17 NOTE — Progress Notes (Addendum)
ANTICOAGULATION CONSULT NOTE - Initial Consult  Pharmacy Consult for warfarin Indication: atrial fibrillation  No Known Allergies  Patient Measurements:   Heparin Dosing Weight:  Vital Signs: Temp: 97.6 F (36.4 C) (01/26 0849) Temp Source: Oral (01/26 0849) BP: 140/70 (01/26 1650) Pulse Rate: 89 (01/26 1650)  Labs: Recent Labs    06/17/18 0903 06/17/18 0907  HGB 12.7*  --   HCT 42.3  --   PLT 214  --   LABPROT  --  19.9*  INR  --  1.71  CREATININE 1.10  --   TROPONINI  --  0.03*    Estimated Creatinine Clearance: 56.8 mL/min (by C-G formula based on SCr of 1.1 mg/dL).   Medical History: Past Medical History:  Diagnosis Date  . AAA (abdominal aortic aneurysm) (HCC)    4.2 X 4.8 cm 04/2013 CT; declined re-eval 02/25/15  . Anxiety   . CAD in native artery March 2007   Cath for dyspnea on exertion: 75% distal LM, 85% RI, 95% mid-distal Cx, in multiple RCA lesions with 95% distal. --> Referred for CABG; has declined further noninvasive evaluation in the absence of worsening symptoms  . COPD (chronic obstructive pulmonary disease) with emphysema (HCC) 05/23/2007   Hosp 5/29-6/01/12- COPD exacerbation ONOX 12/08/10- desaturated to less than 88% for over an hour, qualifying for home O2 during sleep    . Diverticulosis of colon with hemorrhage 05/24/2013  . Dyslipidemia, goal LDL below 70   . History of non-ST Elevation MI (myocardial infarction) March 2007   Admitted with COPD exacerbation, given by CHF. Had some chest pain with positive troponins. Cath showed multivessel disease, referred for CABG  . History of pneumonia   . Hypertension   . Hypoxia     chronic, on home O2  . Obesity (BMI 30-39.9)    One year ago weighed 265 pounds, (12/2012) -- now 234 pounds  . PAF (paroxysmal atrial fibrillation) (HCC)    Anticoagulated on warfarin. Stable -- post op  . Pneumonia    hx  . 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 Seasonal allergies   .  Shortness of breath dyspnea     Medications:  (Not in a hospital admission)   Assessment: 85 yo M recently admitted 1/16 - 1/19 for AECOPD, presents today with continued SOB and productive cough. Also reports blood in urine and sputum. Pharmacy is asked to dose warfarin. INR is subtherapeutic. Home regimen: 2.5 mg once a day on Sunday,Tuesday and Thursday. 2.5 mg TWICE a day on Monday, Wednesday, Friday, and Saturday. The last time he had a dose was yesterday morning. Concurrent Levaquin and steroids are likely to increase sensitivity to warfarin.   Goal of Therapy:  INR 2-3 Monitor platelets by anticoagulation protocol: Yes   Plan:  Give warfarin 5mg  tonight.  Check PT/INR daily. If INR is not in the therapeutic range by morning, recommend bridging with Lovenox 90mg  BID.  Charolotte Eke, PharmD. Mobile: [phone]. 83/26/2020,5:54 PM.

## 2018-06-17 NOTE — Progress Notes (Signed)
New admit from [facility]. Pt refusing to wear his oxygen and take any of his medications. He states he does not want any interventions done and that it is his time to go. Pt denies any suicidal ideation. He expresses that his wife died this past year and he is sick of being in and out of the hospital. Pt is alert and oriented. Pt with labored breathing at rest, and rhonchi auscultated  in bilateral lung fields. Oxygen saturation maintaining in lower 80s on room air. Pt educated on importance of all ordered treatments and supplemental oxygen, but pt continues to refuse care. Pt's daughter updated and at bedside. On call provider made aware of situation and need for possible palliative consult. Order obtained for PRN anti-anxiety medications.This RN also contacted on call chaplain for further support. Pt able to speak to chaplain on the phone and very appreciative for this opportunity. Will continue to monitor closely.

## 2018-06-17 NOTE — ED Notes (Signed)
ED TO INPATIENT HANDOFF REPORT  Name/Age/Gender [name] 85 y.o. male  Code Status    Code Status Orders  (From admission, onward)         Start     Ordered   06/17/18 1400  Do not attempt resuscitation (DNR)  Continuous    Question Answer Comment  In the event of cardiac or respiratory ARREST Do not call a "code blue"   In the event of cardiac or respiratory ARREST Do not perform Intubation, CPR, defibrillation or ACLS   In the event of cardiac or respiratory ARREST Use medication by any route, position, wound care, and other measures to relive pain and suffering. May use oxygen, suction and manual treatment of airway obstruction as needed for comfort.      06/17/18 1359        Code Status History    Date Active Date Inactive Code Status Order ID Comments User Context   06/17/2018 1356 06/17/2018 1359 DNR [id number]  Bobette Mo, MD ED   06/07/2018 1918 06/10/2018 1927 Full Code [id number]  Joycelyn Das, MD Inpatient   06/07/2018 1333 06/07/2018 1918 DNR [id number]  Joycelyn Das, MD ED   05/16/2018 0351 05/19/2018 2131 DNR [id number]  Eduard Clos, MD Inpatient   07/05/2017 1617 07/06/2017 2333 DNR [id number]  Delaine Lame, MD Inpatient   02/21/2017 2322 02/25/2017 2012 DNR [id number]  Eduard Clos, MD Inpatient   01/17/2017 1852 01/21/2017 1826 DNR [id number]  Narda Bonds, MD Inpatient   01/17/2017 1606 01/17/2017 1852 DNR [id number]  Marily Memos, MD ED   09/26/2016 1425 09/27/2016 2320 DNR [id number]  Leatha Gilding, MD Inpatient   05/18/2016 1937 05/22/2016 1902 DNR [id number]  Vassie Loll, MD ED   04/26/2016 1826 04/30/2016 1909 DNR [id number]  Hollice Espy, MD Inpatient   11/01/2015 2251 11/05/2015 2047 DNR [id number]  Therisa Doyne, MD Inpatient   05/20/2013 2052 05/25/2013 2148 DNR [id number]  Gery Pray, MD ED   05/07/2013 1721 05/09/2013 2231 Full Code [id number]  Penny Pia, MD Inpatient   04/29/2013 1636 05/01/2013 1728 Full Code  [id number]  Drema Dallas, MD Inpatient    Advance Directive Documentation     Most Recent Value  Type of Advance Directive  Out of facility DNR (pink MOST or yellow form)  Pre-existing out of facility DNR order (yellow form or pink MOST form)  -  "MOST" Form in Place?  -      Home/SNF/Other Home  Chief Complaint SHoB  Level of Care/Admitting Diagnosis ED Disposition    ED Disposition Condition Comment   Admit  Hospital Area: [facility] [[id number]]  Level of Care: Telemetry [5]  Admit to tele based on following criteria: Monitor for Ischemic changes  Diagnosis: Acute exacerbation of chronic obstructive pulmonary disease (COPD) [facility]) [[id number]]  Admitting Physician: Bobette Mo [[id number]]  Attending Physician: Bobette Mo [phone]  PT Class (Do Not Modify): Observation [104]  PT Acc Code (Do Not Modify): Observation [10022]       Medical History Past Medical History:  Diagnosis Date  . AAA (abdominal aortic aneurysm) (HCC)    4.2 X 4.8 cm 04/2013 CT; declined re-eval 02/25/15  . Anxiety   . CAD in native artery March 2007   Cath for dyspnea on exertion: 75% distal LM, 85% RI, 95% mid-distal Cx, in multiple RCA lesions with 95% distal. --> Referred for CABG; has declined further noninvasive evaluation in the absence of worsening symptoms  . 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 Diverticulosis of colon with hemorrhage 05/24/2013  . Dyslipidemia, goal LDL below 70   . History of non-ST Elevation MI (myocardial infarction) March 2007   Admitted with COPD exacerbation, given by CHF. Had some chest pain with positive troponins. Cath showed multivessel disease, referred for CABG  . History of pneumonia   . Hypertension   . Hypoxia     chronic, on home O2  . Obesity (BMI  30-39.9)    One year ago weighed 265 pounds, (12/2012) -- now 234 pounds  . PAF (paroxysmal atrial fibrillation) (HCC)    Anticoagulated on warfarin. Stable -- post op  . Pneumonia    hx  . 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 Seasonal allergies   . Shortness of breath dyspnea     Allergies No Known Allergies  IV Location/Drains/Wounds Patient Lines/Drains/Airways Status   Active Line/Drains/Airways    Name:   Placement date:   Placement time:   Site:   Days:   Peripheral IV 06/08/18 Left;Posterior Wrist   06/08/18    0424    Wrist   9   Peripheral IV 06/17/18 Left Antecubital   06/17/18    0911    Antecubital   less than 1   Wound / Incision (Open or Dehisced) 01/17/17 Laceration Leg Left;Lower blackened scab over skin tear area   01/17/17    2210    Leg   516          Labs/Imaging Results for orders placed or performed during the hospital encounter of 06/17/18 (from the past 48 hour(s))  Basic metabolic panel     Status: Abnormal   Collection Time: 06/17/18  9:03 AM  Result Value Ref Range   Sodium 142 135 - 145 mmol/L   Potassium 4.0 3.5 - 5.1 mmol/L   Chloride 97 (L) 98 - 111 mmol/L   CO2 39 (H) 22 - 32 mmol/L   Glucose, Bld 93 70 - 99 mg/dL   BUN 34 (H) 8 - 23 mg/dL   Creatinine, Ser 1.61 0.61 - 1.24 mg/dL   Calcium 8.7 (L) 8.9 - 10.3 mg/dL   GFR calc non Af Amer >60 >60 mL/min   GFR calc Af Amer >60 >60 mL/min   Anion gap 6 5 - 15    Comment: Performed at [facility], [address] [place], [place] [address]  CBC     Status: Abnormal   Collection Time: 06/17/18  9:03 AM  Result Value Ref Range   WBC 9.4 4.0 - 10.5 K/uL   RBC 4.45 4.22 - 5.81 MIL/uL   Hemoglobin 12.7 (L) 13.0 - 17.0 g/dL   HCT 54.0 98.1 - 19.1 %   MCV 95.1 80.0 - 100.0 fL   MCH 28.5 26.0 - 34.0 pg   MCHC 30.0 30.0 - 36.0 g/dL   RDW 47.8 29.5 - 62.1 %   Platelets 214 150 - 400 K/uL   nRBC 0.0 0.0 - 0.2 %    Comment: Performed at [facility], [address], [place], [place] [address]  Troponin I - ONCE - STAT     Status: Abnormal   Collection Time: 06/17/18  9:07 AM  Result Value Ref Range   Troponin I  0.03 (HH) <0.03 ng/mL    Comment: CRITICAL RESULT CALLED TO, READ BACK BY AND VERIFIED WITH: ENGLE.Kirtland BouchardK. RN AT 1013 06/17/18 MULLINS,T Performed at [facility], [address] [place], [place] [address]   Brain natriuretic peptide     Status: Abnormal   Collection Time: 06/17/18  9:07 AM  Result Value Ref Range   B Natriuretic Peptide 167.6 (H) 0.0 - 100.0 pg/mL    Comment: Performed at [facility], [address] [place], [place] [address]  Protime-INR     Status: Abnormal   Collection Time: 06/17/18  9:07 AM  Result Value Ref Range   Prothrombin Time 19.9 (H) 11.4 - 15.2 seconds   INR 1.71     Comment: Performed at [facility], [address] [place], [place] [address]  CBG monitoring, ED     Status: Abnormal   Collection Time: 06/17/18  4:59 PM  Result Value Ref Range   Glucose-Capillary 244 (H) 70 - 99 mg/dL  Troponin I - Now Then Q6H     Status: None   Collection Time: 06/17/18  5:12 PM  Result Value Ref Range   Troponin I <0.03 <0.03 ng/mL    Comment: Performed at [facility], [address] [place], [place] [address]   Ct Angio Chest Pe W And/or Wo Contrast  Result Date: 06/17/2018 CLINICAL DATA:  Shortness of breath and productive cough for the past 3 weeks. History of COPD. EXAM: CT ANGIOGRAPHY CHEST WITH CONTRAST TECHNIQUE: Multidetector CT imaging of the chest was performed using the standard protocol during bolus administration of intravenous contrast. Multiplanar CT image reconstructions and MIPs were obtained to evaluate the vascular anatomy. CONTRAST:  100mL ISOVUE-370 IOPAMIDOL (ISOVUE-370) INJECTION 76% COMPARISON:  Portable chest obtained today. Chest CT dated 06/07/2018. FINDINGS: Cardiovascular:  Atheromatous calcifications, including the coronary arteries and aorta. Normally opacified pulmonary arteries with no pulmonary arterial filling defects seen. Stable dilated main pulmonary artery. Mediastinum/Nodes: No enlarged mediastinal, hilar, or axillary lymph nodes. Thyroid gland, trachea, and esophagus demonstrate no significant findings. Lungs/Pleura: The previously demonstrated 1.8 x 1.8 cm ground-glass opacity in the right upper lobe is slightly larger, currently measuring 2.2 x 1.6 cm on image number 51 of series 7. Post right lower lobectomy changes are again demonstrated with slightly less prominent density in the proximal right middle lobe bronchus, with scattered air within this area. The previously demonstrated chronic 7 mm right posterior basal right upper lobe is unchanged on image number 93 series 7 and previously shown stable since 2014, compatible with a benign process. Mild tree in bud opacities in that portion of the lung have not changed significantly. No pleural fluid. Upper Abdomen: Unremarkable. Musculoskeletal: Thoracic spine degenerative changes. Old bilateral rib fractures. Review of the MIP images confirms the above findings. IMPRESSION: 1. No pulmonary emboli or acute abnormality. 2. Mild increase in size of a ground-glass opacity in the right upper lobe, currently measuring 2.2 x 1.6 cm. A follow-up chest CT is recommended in 6 months. 3. Stable mild chronic tree in bud opacities in the basilar portion of the right upper lobe, suspicious for a mild active infectious process. 4. Stable dilated main pulmonary artery compatible with pulmonary arterial hypertension. 5. Slightly improved probable mucous plugging in the right middle lobe bronchus. 6. Status post right lower lobectomy. 7. Calcific coronary artery and aortic atherosclerosis. Aortic Atherosclerosis (ICD10-I70.0). Electronically Signed   By: Beckie SaltsSteven  Reid M.D.   On: 06/17/2018 11:27   Dg  Chest Portable 1 View  Result Date:  06/17/2018 CLINICAL DATA:  Shortness of breath EXAM: PORTABLE CHEST 1 VIEW COMPARISON:  06/07/2018 FINDINGS: Previous median sternotomy with CABG procedure. Heart size appears normal. There are postsurgical changes and volume loss are from previous right lower lobectomy. No superimposed airspace consolidation. No pleural effusion or edema. IMPRESSION: 1. No acute cardiopulmonary abnormalities. Electronically Signed   By: Signa Kell M.D.   On: 06/17/2018 09:36   EKG Interpretation  Date/Time:  Sunday June 17 2018 08:50:21 EST Ventricular Rate:  67 PR Interval:    QRS Duration: 153 QT Interval:  430 QTC Calculation: 454 R Axis:   115 Text Interpretation:  Atrial flutter RBBB and LPFB No significant change since last tracing Confirmed by Isaacs, Cameron ([id number]) on 06/17/2018 8:59:07 AM Also confirmed by Isaacs, Cameron ([id number]), editor Simpson, Miranda ([id number])  on 06/17/2018 11:29:32 AM   Pending Labs Unresulted Labs (From admission, onward)    Start     Ordered   06/18/18 0500  CBC WITH DIFFERENTIAL  Tomorrow morning,   R     06/17/18 1356   06/18/18 0500  Protime-INR  Daily,   R     06/17/18 1702   06/17/18 1654  Troponin I - Now Then Q6H  Now then every 6 hours,   R     01 /26/20 1654          Vitals/Pain Today's Vitals   06/17/18 1800 06/17/18 1830 06/17/18 1900 06/17/18 1902  BP: 136/71 (!) 141/67 (!) 147/67   Pulse: 80 80 82   Resp: 14 (!) 24 (!) 25   Temp:      TempSrc:      SpO2: 94% 96% (!) 79%   PainSc:    0-No pain    Isolation Precautions No active isolations  Medications Medications  0.9 %  sodium chloride infusion (1,000 mLs Intravenous New Bag/Given 06/17/18 1051)  sodium chloride (PF) 0.9 % injection (has no administration in time range)  iopamidol (ISOVUE-370) 76 % injection (has no administration in time range)  acetaminophen (TYLENOL) tablet 650 mg (has no administration in time range)    Or  acetaminophen (TYLENOL) suppository 650 mg (has no  administration in time range)  ondansetron (ZOFRAN) tablet 4 mg (has no administration in time range)    Or  ondansetron (ZOFRAN) injection 4 mg (has no administration in time range)  albuterol (PROVENTIL) (2.5 MG/3ML) 0.083% nebulizer solution 2.5 mg (has no administration in time range)  guaiFENesin (MUCINEX) 12 hr tablet 600 mg (600 mg Oral Given 06/17/18 1424)  insulin aspart (novoLOG) injection 0-15 Units (5 Units Subcutaneous Given 06/17/18 1707)  methylPREDNISolone sodium succinate (SOLU-MEDROL) 40 mg/mL injection 40 mg (40 mg Intravenous Given 06/17/18 1649)    Followed by  predniSONE (DELTASONE) tablet 40 mg (has no administration in time range)  ipratropium-albuterol (DUONEB) 0.5-2.5 (3) MG/3ML nebulizer solution 3 mL (3 mLs Nebulization Given 06/17/18 1557)  levofloxacin (LEVAQUIN) IVPB 750 mg (0 mg Intravenous Stopped 06/17/18 1844)  acetaZOLAMIDE (DIAMOX) tablet 250 mg (250 mg Oral Given 06/17/18 1728)  lisinopril (PRINIVIL,ZESTRIL) tablet 2.5 mg (2.5 mg Oral Given 06/17/18 1728)  metoprolol succinate (TOPROL-XL) 24 hr tablet 12.5 mg (12.5 mg Oral Given 06/17/18 1727)  Warfarin - Pharmacist Dosing Inpatient ( Does not apply Not Given 06/17/18 1844)  albuterol (PROVENTIL,VENTOLIN) solution continuous neb (10 mg/hr Nebulization Given 06/17/18 0924)  ipratropium (ATROVENT) nebulizer solution 1 mg (1 mg Nebulization Given 06/17/18 0924)  magnesium sulfate IVPB 2 g 50 mL (0  g Intravenous Stopped 06/17/18 1028)  methylPREDNISolone sodium succinate (SOLU-MEDROL) 125 mg/2 mL injection 125 mg (125 mg Intravenous Given 06/17/18 0917)  doxycycline (VIBRAMYCIN) 100 mg in sodium chloride 0.9 % 250 mL IVPB (0 mg Intravenous Stopped 06/17/18 1346)  iopamidol (ISOVUE-370) 76 % injection 100 mL (100 mLs Intravenous Contrast Given 06/17/18 1101)  ipratropium-albuterol (DUONEB) 0.5-2.5 (3) MG/3ML nebulizer solution 3 mL (3 mLs Nebulization Given 06/17/18 1206)  warfarin (COUMADIN) tablet 5 mg (5 mg Oral Given  06/17/18 1843)    Mobility non-ambulatory

## 2018-06-17 NOTE — ED Notes (Signed)
Patient given meal tray. Admitted provider notified that patient does not have his teeth and will need soft foods.

## 2018-06-17 NOTE — ED Notes (Signed)
Admitting provider at bedside.

## 2018-06-18 ENCOUNTER — Other Ambulatory Visit: Payer: Self-pay

## 2018-06-18 DIAGNOSIS — I509 Heart failure, unspecified: Secondary | ICD-10-CM | POA: Diagnosis not present

## 2018-06-18 DIAGNOSIS — J9621 Acute and chronic respiratory failure with hypoxia: Secondary | ICD-10-CM | POA: Diagnosis present

## 2018-06-18 DIAGNOSIS — I110 Hypertensive heart disease with heart failure: Secondary | ICD-10-CM | POA: Diagnosis present

## 2018-06-18 DIAGNOSIS — I5032 Chronic diastolic (congestive) heart failure: Secondary | ICD-10-CM | POA: Diagnosis present

## 2018-06-18 DIAGNOSIS — I4891 Unspecified atrial fibrillation: Secondary | ICD-10-CM | POA: Diagnosis not present

## 2018-06-18 DIAGNOSIS — I480 Paroxysmal atrial fibrillation: Secondary | ICD-10-CM | POA: Diagnosis present

## 2018-06-18 DIAGNOSIS — Z515 Encounter for palliative care: Secondary | ICD-10-CM | POA: Diagnosis present

## 2018-06-18 DIAGNOSIS — Z7951 Long term (current) use of inhaled steroids: Secondary | ICD-10-CM | POA: Diagnosis not present

## 2018-06-18 DIAGNOSIS — Z7401 Bed confinement status: Secondary | ICD-10-CM | POA: Diagnosis not present

## 2018-06-18 DIAGNOSIS — E785 Hyperlipidemia, unspecified: Secondary | ICD-10-CM | POA: Diagnosis present

## 2018-06-18 DIAGNOSIS — Z7952 Long term (current) use of systemic steroids: Secondary | ICD-10-CM | POA: Diagnosis not present

## 2018-06-18 DIAGNOSIS — Z8679 Personal history of other diseases of the circulatory system: Secondary | ICD-10-CM | POA: Diagnosis not present

## 2018-06-18 DIAGNOSIS — Z7189 Other specified counseling: Secondary | ICD-10-CM | POA: Diagnosis not present

## 2018-06-18 DIAGNOSIS — F419 Anxiety disorder, unspecified: Secondary | ICD-10-CM | POA: Diagnosis present

## 2018-06-18 DIAGNOSIS — N189 Chronic kidney disease, unspecified: Secondary | ICD-10-CM | POA: Diagnosis not present

## 2018-06-18 DIAGNOSIS — R0602 Shortness of breath: Secondary | ICD-10-CM | POA: Diagnosis not present

## 2018-06-18 DIAGNOSIS — Z7982 Long term (current) use of aspirin: Secondary | ICD-10-CM | POA: Diagnosis not present

## 2018-06-18 DIAGNOSIS — Z79899 Other long term (current) drug therapy: Secondary | ICD-10-CM | POA: Diagnosis not present

## 2018-06-18 DIAGNOSIS — I714 Abdominal aortic aneurysm, without rupture: Secondary | ICD-10-CM | POA: Diagnosis present

## 2018-06-18 DIAGNOSIS — I2510 Atherosclerotic heart disease of native coronary artery without angina pectoris: Secondary | ICD-10-CM | POA: Diagnosis present

## 2018-06-18 DIAGNOSIS — Z951 Presence of aortocoronary bypass graft: Secondary | ICD-10-CM | POA: Diagnosis not present

## 2018-06-18 DIAGNOSIS — M2550 Pain in unspecified joint: Secondary | ICD-10-CM | POA: Diagnosis not present

## 2018-06-18 DIAGNOSIS — F339 Major depressive disorder, recurrent, unspecified: Secondary | ICD-10-CM | POA: Diagnosis not present

## 2018-06-18 DIAGNOSIS — F329 Major depressive disorder, single episode, unspecified: Secondary | ICD-10-CM | POA: Diagnosis present

## 2018-06-18 DIAGNOSIS — J449 Chronic obstructive pulmonary disease, unspecified: Secondary | ICD-10-CM | POA: Diagnosis not present

## 2018-06-18 DIAGNOSIS — Z89431 Acquired absence of right foot: Secondary | ICD-10-CM | POA: Diagnosis not present

## 2018-06-18 DIAGNOSIS — I252 Old myocardial infarction: Secondary | ICD-10-CM | POA: Diagnosis not present

## 2018-06-18 DIAGNOSIS — Z9981 Dependence on supplemental oxygen: Secondary | ICD-10-CM | POA: Diagnosis not present

## 2018-06-18 DIAGNOSIS — J440 Chronic obstructive pulmonary disease with acute lower respiratory infection: Secondary | ICD-10-CM | POA: Diagnosis present

## 2018-06-18 DIAGNOSIS — R7989 Other specified abnormal findings of blood chemistry: Secondary | ICD-10-CM | POA: Diagnosis not present

## 2018-06-18 DIAGNOSIS — J441 Chronic obstructive pulmonary disease with (acute) exacerbation: Secondary | ICD-10-CM | POA: Diagnosis present

## 2018-06-18 DIAGNOSIS — Z66 Do not resuscitate: Secondary | ICD-10-CM | POA: Diagnosis present

## 2018-06-18 DIAGNOSIS — J302 Other seasonal allergic rhinitis: Secondary | ICD-10-CM | POA: Diagnosis present

## 2018-06-18 DIAGNOSIS — R739 Hyperglycemia, unspecified: Secondary | ICD-10-CM | POA: Diagnosis present

## 2018-06-18 LAB — CBC WITH DIFFERENTIAL/PLATELET
Abs Immature Granulocytes: 0.51 10*3/uL — ABNORMAL HIGH (ref 0.00–0.07)
BASOS ABS: 0 10*3/uL (ref 0.0–0.1)
Basophils Relative: 0 %
Eosinophils Absolute: 0 10*3/uL (ref 0.0–0.5)
Eosinophils Relative: 0 %
HCT: 38.7 % — ABNORMAL LOW (ref 39.0–52.0)
Hemoglobin: 11.6 g/dL — ABNORMAL LOW (ref 13.0–17.0)
IMMATURE GRANULOCYTES: 3 %
Lymphocytes Relative: 6 %
Lymphs Abs: 0.9 10*3/uL (ref 0.7–4.0)
MCH: 28 pg (ref 26.0–34.0)
MCHC: 30 g/dL (ref 30.0–36.0)
MCV: 93.5 fL (ref 80.0–100.0)
Monocytes Absolute: 0.9 10*3/uL (ref 0.1–1.0)
Monocytes Relative: 6 %
NEUTROS PCT: 85 %
Neutro Abs: 12.7 10*3/uL — ABNORMAL HIGH (ref 1.7–7.7)
Platelets: 235 10*3/uL (ref 150–400)
RBC: 4.14 MIL/uL — ABNORMAL LOW (ref 4.22–5.81)
RDW: 15.1 % (ref 11.5–15.5)
WBC: 15.1 10*3/uL — ABNORMAL HIGH (ref 4.0–10.5)
nRBC: 0 % (ref 0.0–0.2)

## 2018-06-18 LAB — BASIC METABOLIC PANEL
Anion gap: 9 (ref 5–15)
BUN: 37 mg/dL — ABNORMAL HIGH (ref 8–23)
CALCIUM: 8.3 mg/dL — AB (ref 8.9–10.3)
CO2: 32 mmol/L (ref 22–32)
Chloride: 96 mmol/L — ABNORMAL LOW (ref 98–111)
Creatinine, Ser: 1.22 mg/dL (ref 0.61–1.24)
GFR calc Af Amer: >60 mL/min (ref >60)
GFR calc non Af Amer: 55 mL/min — ABNORMAL LOW (ref >60)
Glucose, Bld: 99 mg/dL (ref 70–99)
Potassium: 3.9 mmol/L (ref 3.5–5.1)
SODIUM: 137 mmol/L (ref 135–145)

## 2018-06-18 LAB — GLUCOSE, CAPILLARY
Glucose-Capillary: 74 mg/dL (ref 70–99)
Glucose-Capillary: 77 mg/dL (ref 70–99)
Glucose-Capillary: 88 mg/dL (ref 70–99)
Glucose-Capillary: 89 mg/dL (ref 70–99)
Glucose-Capillary: 90 mg/dL (ref 70–99)

## 2018-06-18 LAB — PROTIME-INR
INR: 1.92
Prothrombin Time: 21.7 seconds — ABNORMAL HIGH (ref 11.4–15.2)

## 2018-06-18 MED ORDER — DOXYCYCLINE HYCLATE 100 MG PO TABS: 100.0000 mg | ORAL_TABLET | Freq: Two times a day (BID) | ORAL | Status: DC

## 2018-06-18 MED ORDER — BUDESONIDE 0.5 MG/2ML IN SUSP
0.5000 mg | Freq: Two times a day (BID) | RESPIRATORY_TRACT | Status: DC
  Administered 2018-06-18 – 2018-06-21 (×6): 0.5 mg via RESPIRATORY_TRACT
  Filled 2018-06-18 (×6): qty 2

## 2018-06-18 MED ORDER — WARFARIN SODIUM 5 MG PO TABS: 5.0000 mg | ORAL_TABLET | Freq: Once | ORAL | Status: AC

## 2018-06-18 NOTE — Progress Notes (Addendum)
OT Cancellation Note  Patient Details Name: Roberto Reilly MRN: [id number] DOB: 12/22/1933   Cancelled Treatment:    Reason Eval/Treat Not Completed: Patient declined, no reason specified; pt declining working with therapy this AM, states he is waiting to see the chaplain. Will follow.   Marcy Siren, OT [facility] Pager [phone] Office [phone]   Orlando Penner 06/18/2018, 11:16 AM

## 2018-06-18 NOTE — Telephone Encounter (Signed)
Please review for refill. Thanks!  

## 2018-06-18 NOTE — Progress Notes (Signed)
Pt refuses O2 and neb treatments

## 2018-06-18 NOTE — Progress Notes (Signed)
During bedside on initial assessment patient appeared to be SOB while in chair. Pt had Oxygen on top of head. Pt educated on the importance of oxygen. Pt refused to wear at this time. Will update provider & continue to encourage.

## 2018-06-18 NOTE — Progress Notes (Signed)
   06/18/18 1400  Clinical Encounter Type  Visited With Patient and family together  Visit Type Initial;Psychological support;Spiritual support  Referral From Nurse  Consult/Referral To Chaplain  Spiritual Encounters  Spiritual Needs Prayer;Emotional;Other (Comment) (Spiritual Care Conversation/Support)  Stress Factors  Patient Stress Factors Health changes;Major life changes;Exhausted  Family Stress Factors Health changes;Major life changes   I visited with Mr. Roberto Reilly per spiritual care consult. Mr. Roberto Reilly wanted to talk to me about how "tired" he is. He has a strong faith in God and does not believe in suicide; but stated that he wants to die. Mr. Roberto Reilly told me that he does not want any treatment that will prolong his life. This is why he has refused to take his medicine and use his oxygen. He told me that he wakes up at night coughing and that it has worn him down. Mr. Roberto Reilly stated that his wife had passed away 3 years ago and that he wants to "be with her."  Mr. Roberto Reilly told me that he feels that staff aren't listening to him when he says that he doesn't want life-prolonging treatment.  We discussed ways to ease his anxiety using prayer. I prayed with him and he requested that I follow up.  Mr. Roberto Reilly's daughter was at the bedside and going through grief of her own over her father not wanting to live anymore, but she is supportive of him and his wishes.  I will follow-up.  Please, contact Spiritual Care for further assistance.   Chaplain Roberto Reilly M.Div., [facility]

## 2018-06-18 NOTE — Progress Notes (Signed)
PROGRESS NOTE  Roberto Reilly ZOX:[id number] DOB: 06/25/1933 DOA: 06/17/2018 PCP: Pearson GrippeKim, Tadarius, MD   LOS: 0 days   Brief Narrative / Interim history: 85 year old male with history of COPD on 4 L at baseline, CAD status post CABG, history of AAA, anxiety, depression, A. fib on warfarin presented with worsening dyspnea, cough and sputum productions concerning for COPD exacerbation.  Recently treated with prednisone and albuterol outpatient about 3 weeks ago without significant improvement.  Started on steroid, antibiotic and DuoNeb.  Pulmicort added this morning.  CTA negative for PE but showed RUL opacity.  This morning, patient refused to wear his oxygen saying, "I am ready".  His oxygen saturation is down to 81%.  He is sitting on bedside chair trying to eat breakfast.  He has increased work of breathing.  He is alert and awake.  Appropriately oriented.  Daughter at bedside.  He understands that he could die from his dangerously low oxygen.  He is willing to talk to palliative care.  Subjective: As above  Assessment & Plan: Principal Problem:   Acute exacerbation of chronic obstructive pulmonary disease (COPD) (HCC) Active Problems:   Coronary atherosclerosis   Paroxysmal atrial fibrillation (HCC); CHA2DS2-VASc Score = 4. On Warfarin   Dyslipidemia, goal LDL below 70   Essential hypertension   Hypercapnia   Hyperglycemia   Chronic diastolic CHF (congestive heart failure), NYHA class 2 (HCC)  Acute exacerbation of chronic COPD: patient refusing to wear his home oxygen and desaturating to 81% on room air.  He understand the consequences of this.  He is interested in palliative care. -We will continue steroid, antibiotic and breathing treatments as we wait on palliative consult. -Further plan depending on the outcome of his encounter with palliative care.  CAD status post CABG: Currently without chest pain. -Continue home meds. -Beta-blocker started this admission. -Continue statin and  aspirin  Paroxysmal atrial fibrillation without RVR: On warfarin but not on rate or rhythm control. -Continue warfarin per pharmacy-INR still subtherapeutic. -Started on metoprolol succinate 12.5 mg daily this admission  Hypertension/hyperlipidemia: -Continue home meds for now.  Chronic diastolic CHF Echo on 117/20 with EF of 55%, inadequate LV wall motion and diastolic function assessment due to A. fib.  D-shaped interventricular septum.  Peak RV-RA gradient of 30 mmHg. -Continue Lasix -DC lisinopril-started this admission. -Daily weight, intake output and renal function until further plan with his care.  Scheduled Meds: . acetaZOLAMIDE  250 mg Oral BID  . amLODipine  10 mg Oral Daily  . aspirin EC  81 mg Oral Daily  . atorvastatin  40 mg Oral q1800  . budesonide (PULMICORT) nebulizer solution  0.5 mg Nebulization BID  . cholecalciferol  1,000 Units Oral Daily  . furosemide  40 mg Oral Daily  . guaiFENesin  600 mg Oral BID  . insulin aspart  0-15 Units Subcutaneous TID WC  . ipratropium-albuterol  3 mL Nebulization QID  . lisinopril  2.5 mg Oral Daily  . methylPREDNISolone (SOLU-MEDROL) injection  40 mg Intravenous Q6H   Followed by  . [START ON 06/19/2018] predniSONE  40 mg Oral Q breakfast  . metoprolol succinate  12.5 mg Oral Daily  . potassium chloride SA  20 mEq Oral Daily  . Warfarin - Pharmacist Dosing Inpatient   Does not apply q1800   Continuous Infusions: . sodium chloride 1,000 mL (06/17/18 1051)  . levofloxacin (LEVAQUIN) IV Stopped (06/17/18 1844)   PRN Meds:.sodium chloride, acetaminophen **OR** acetaminophen, albuterol, LORazepam, ondansetron **OR** ondansetron (ZOFRAN) IV  DVT  prophylaxis: On warfarin for A. fib Code Status: DNR Family Communication: Daughter at bedside Disposition Plan: Remains inpatient pending further goal of care with palliative care.  He is refusing to wear his oxygen despite his dangerously low saturation saying "I am ready".  He  understands that he could die from low oxygen.   Consultants:   Palliative care  Procedures:   None  Antimicrobials:  Doxycycline 1/26  Levaquin 1/27  Objective: Vitals:   06/18/18 0414 06/18/18 0844 06/18/18 0857 06/18/18 0952  BP: (!) 104/59   (!) 113/45  Pulse: (!) 53  79 61  Resp: 18     Temp: 98 F (36.7 C)     TempSrc: Oral     SpO2: 96% (!) 88% (!) 76% (!) 89%  Height:        Intake/Output Summary (Last 24 hours) at 06/18/2018 1201 Last data filed at 06/18/2018 0700 Gross per 24 hour  Intake 840 ml  Output 300 ml  Net 540 ml   Filed Weights    Examination:  GENERAL: Sitting on bedside chair eating breakfast HEENT: MMM.  Vision and Hearing grossly intact.  NECK: Supple.  No JVD.  LUNGS: [place] over his head.  Some increased work of breathing.  Rhonchi bilaterally HEART:  RRR. Heart sounds normal.  ABD: Bowel sounds present. Soft. Non tender.  MSK/EXT:   no edema bilaterally.  SKIN: no apparent skin lesion.  NEURO: Awake, alert and oriented appropriately.  No gross deficit.  PSYCH: Calm. Normal affect.   Data Reviewed: I have independently reviewed following labs and imaging studies  CBC: Recent Labs  Lab 06/17/18 0903 06/18/18 0421  WBC 9.4 15.1*  NEUTROABS  --  12.7*  HGB 12.7* 11.6*  HCT 42.3 38.7*  MCV 95.1 93.5  PLT 214 235   Basic Metabolic Panel: Recent Labs  Lab 06/17/18 0903 06/18/18 0421  NA 142 137  K 4.0 3.9  CL 97* 96*  CO2 39* 32  GLUCOSE 93 99  BUN 34* 37*  CREATININE 1.10 1.22  CALCIUM 8.7* 8.3*   GFR: Estimated Creatinine Clearance: 51.2 mL/min (by C-G formula based on SCr of 1.22 mg/dL). Liver Function Tests: No results for input(s): AST, ALT, ALKPHOS, BILITOT, PROT, ALBUMIN in the last 168 hours. No results for input(s): LIPASE, AMYLASE in the last 168 hours. No results for input(s): AMMONIA in the last 168 hours. Coagulation Profile: Recent Labs  Lab 06/17/18 0907 06/18/18 0421  INR 1.71 1.92   Cardiac  Enzymes: Recent Labs  Lab 06/17/18 0907 06/17/18 1712 06/17/18 2247  TROPONINI 0.03* <0.03 0.03*   BNP (last 3 results) No results for input(s): PROBNP in the last 8760 hours. HbA1C: No results for input(s): HGBA1C in the last 72 hours. CBG: Recent Labs  Lab 06/17/18 1659 06/17/18 2358 06/18/18 0740  GLUCAP 244* 74 90   Lipid Profile: No results for input(s): CHOL, HDL, LDLCALC, TRIG, CHOLHDL, LDLDIRECT in the last 72 hours. Thyroid Function Tests: No results for input(s): TSH, T4TOTAL, FREET4, T3FREE, THYROIDAB in the last 72 hours. Anemia Panel: No results for input(s): VITAMINB12, FOLATE, FERRITIN, TIBC, IRON, RETICCTPCT in the last 72 hours. Urine analysis:    Component Value Date/Time   COLORURINE YELLOW 06/07/2018 1256   APPEARANCEUR CLEAR 06/07/2018 1256   LABSPEC >1.046 (H) 06/07/2018 1256   PHURINE 7.0 06/07/2018 1256   GLUCOSEU 50 (A) 06/07/2018 1256   HGBUR NEGATIVE 06/07/2018 1256   BILIRUBINUR NEGATIVE 06/07/2018 1256   KETONESUR NEGATIVE 06/07/2018 1256   PROTEINUR  NEGATIVE 06/07/2018 1256   UROBILINOGEN 0.2 10/19/2010 1150   NITRITE NEGATIVE 06/07/2018 1256   LEUKOCYTESUR NEGATIVE 06/07/2018 1256   Sepsis Labs: Invalid input(s): PROCALCITONIN, LACTICIDVEN  No results found for this or any previous visit (from the past 240 hour(s)).    Radiology Studies: No results found.    Marsena Taff T. [facility] Pager [phone]  If 7PM-7AM, please contact night-coverage [email or web address] Password [email or web address] 06/18/2018, 12:01 PM

## 2018-06-18 NOTE — Progress Notes (Addendum)
Discussed medication administration with patient this am. Pt stated that medication would be taken. Rn returned to to administer medications x2. Pt refused. Education provided & pt not agreeable at this time. Pt has agreed to try to wear oxygen at 4 lt. Will continue to monitor.

## 2018-06-18 NOTE — Progress Notes (Signed)
ANTICOAGULATION CONSULT NOTE - Follow Up Consult  Pharmacy Consult for warfarin Indication:hx atrial fibrillation  No Known Allergies  Patient Measurements: Height: 6' (182.9 cm) Weight: (pt refused to stand for weight and is in chair at this time) IBW/kg (Calculated) : 77.6 Heparin Dosing Weight:   Vital Signs: Temp: 98 F (36.7 C) (01/27 0414) Temp Source: Oral (01/27 0414) BP: 104/59 (01/27 0414) Pulse Rate: 79 (01/27 0857)  Labs: Recent Labs    06/17/18 0903 06/17/18 0907 06/17/18 1712 06/17/18 2247 06/18/18 0421  HGB 12.7*  --   --   --  11.6*  HCT 42.3  --   --   --  38.7*  PLT 214  --   --   --  235  LABPROT  --  19.9*  --   --  21.7*  INR  --  1.71  --   --  1.92  CREATININE 1.10  --   --   --  1.22  TROPONINI  --  0.03* <0.03 0.03*  --     Estimated Creatinine Clearance: 51.2 mL/min (by C-G formula based on SCr of 1.22 mg/dL).   Medications:  PTA warfarin regimen: 5 mg (2.5 mg bid) daily except 2.5 mg on TTSun (verified with pt's daughter)  Assessment: Patient is an 85 y.o with gx afib on warfarin PTA, presented to the ED on 06/17/18 with c/o SOB.  Warfarin resumed inpatient.  Today, 06/18/2018: - INR up 1.92 - CBC ok - no bleeding documented - drug-drug intxns: Levaquin can increase INR  Goal of Therapy:  INR 2-3 Monitor platelets by anticoagulation protocol: Yes   Plan:  - warfarin 5 mg PO x1 (home dose) - monitor for s/s bleeding  Maribel Hadley P 06/18/2018,9:11 AM

## 2018-06-18 NOTE — Progress Notes (Signed)
PT Cancellation Note  Patient Details Name: Kionte Surman MRN: [id number] DOB: 1933/06/08   Cancelled Treatment:    Reason Eval/Treat Not Completed: Medical issues which prohibited therapy(pt refused PT 2* feeling too short of breath). Will follow.    Ralene Bathe Kistler PT 06/18/2018  Acute Rehabilitation Services Pager [phone] Office [phone]

## 2018-06-18 NOTE — Progress Notes (Addendum)
Patient reports episode of dizziness x 3 while sitting up in chair. Pt denied worsening sob, n/v, cp. Episode has resolved. Pt has remained in chair all day. VS remain stable 52, 120 /80,  98% 4lt.

## 2018-06-19 DIAGNOSIS — Z515 Encounter for palliative care: Secondary | ICD-10-CM

## 2018-06-19 DIAGNOSIS — I480 Paroxysmal atrial fibrillation: Secondary | ICD-10-CM

## 2018-06-19 DIAGNOSIS — J441 Chronic obstructive pulmonary disease with (acute) exacerbation: Principal | ICD-10-CM

## 2018-06-19 DIAGNOSIS — Z7189 Other specified counseling: Secondary | ICD-10-CM

## 2018-06-19 LAB — GLUCOSE, CAPILLARY
GLUCOSE-CAPILLARY: 139 mg/dL — AB (ref 70–99)
Glucose-Capillary: 69 mg/dL — ABNORMAL LOW (ref 70–99)
Glucose-Capillary: 166 mg/dL — ABNORMAL HIGH (ref 70–99)
Glucose-Capillary: 121 mg/dL — ABNORMAL HIGH (ref 70–99)
Glucose-Capillary: 87 mg/dL (ref 70–99)

## 2018-06-19 LAB — PROTIME-INR
INR: 2.02
PROTHROMBIN TIME: 22.6 s — AB (ref 11.4–15.2)

## 2018-06-19 MED ORDER — MORPHINE SULFATE 10 MG/5ML PO SOLN
5.0000 mg | Freq: Four times a day (QID) | ORAL | Status: DC
  Administered 2018-06-19 – 2018-06-21 (×8): 5 mg via ORAL
  Filled 2018-06-19 (×8): qty 5

## 2018-06-19 MED ORDER — WARFARIN SODIUM 5 MG PO TABS
5.0000 mg | ORAL_TABLET | Freq: Once | ORAL | Status: AC
  Administered 2018-06-19: 5 mg via ORAL
  Filled 2018-06-19: qty 1

## 2018-06-19 MED ORDER — ENSURE ENLIVE PO LIQD
237.0000 mL | Freq: Two times a day (BID) | ORAL | Status: DC
  Administered 2018-06-19 – 2018-06-20 (×2): 237 mL via ORAL

## 2018-06-19 MED ORDER — MORPHINE SULFATE 10 MG/5ML PO SOLN: 5.0000 mg | ORAL | Status: DC | PRN

## 2018-06-19 NOTE — Progress Notes (Signed)
PROGRESS NOTE  Roberto ChristmasJames Reilly ONG:[id number] DOB: 07/01/1933 DOA: 06/17/2018 PCP: Pearson GrippeKim, Rohnan, MD   LOS: 1 day   Brief Narrative / Interim history: 85 year old male with history of COPD on 4 L at baseline, CAD status post CABG, history of AAA, anxiety, depression, A. fib on warfarin presented with worsening dyspnea, cough and sputum productions concerning for COPD exacerbation.  Recently treated with prednisone and albuterol outpatient about 3 weeks ago without significant improvement.  Started on steroid, antibiotic and DuoNeb.  Pulmicort added this morning.  CTA negative for PE but showed RUL opacity.  On 1/27: patient refused to wear his oxygen saying, "I am ready".  His oxygen saturation dropped to 81%. Palliative care consulted.  Subjective: No major events overnight.  Willing to wear his oxygen and take his medication.  Waiting to hear from palliative care about residential hospice.  Denies chest pain.  Still with shortness of breath.   Assessment & Plan: Principal Problem:   Acute exacerbation of chronic obstructive pulmonary disease (COPD) (HCC) Active Problems:   Coronary atherosclerosis   Paroxysmal atrial fibrillation (HCC); CHA2DS2-VASc Score = 4. On Warfarin   Dyslipidemia, goal LDL below 70   Essential hypertension   Hypercapnia   Hyperglycemia   Chronic diastolic CHF (congestive heart failure), NYHA class 2 (HCC)  Acute exacerbation of chronic COPD: patient refusing to wear his home oxygen and desaturating to 81% on room air.  He understand the consequences of this.  He is interested in palliative care. -We will continue steroid, antibiotic and breathing treatments as we wait on palliative recommendation -Can add morphine to elevate anxiety and dyspnea if needed -Further plan depending on the outcome of his encounter with palliative care.  CAD status post CABG: Currently without chest pain. -Continue beta-blocker (started here) and home medications  Paroxysmal atrial  fibrillation without RVR: -Continue metoprolol and warfarin pending palliative recommendation.  Hypertension/hyperlipidemia: -Continue home meds for now.  Chronic diastolic CHF Echo on 117/20 with EF of 55%, inadequate LV wall motion and diastolic function assessment due to A. fib.  D-shaped interventricular septum.  Peak RV-RA gradient of 30 mmHg. -Continue p.o. Lasix -Daily weight, intake output and renal function until further plan with his care.  Scheduled Meds: . acetaZOLAMIDE  250 mg Oral BID  . amLODipine  10 mg Oral Daily  . aspirin EC  81 mg Oral Daily  . atorvastatin  40 mg Oral q1800  . budesonide (PULMICORT) nebulizer solution  0.5 mg Nebulization BID  . cholecalciferol  1,000 Units Oral Daily  . feeding supplement (ENSURE ENLIVE)  237 mL Oral BID BM  . furosemide  40 mg Oral Daily  . guaiFENesin  600 mg Oral BID  . insulin aspart  0-15 Units Subcutaneous TID WC  . ipratropium-albuterol  3 mL Nebulization QID  . metoprolol succinate  12.5 mg Oral Daily  . morphine  5 mg Oral Q6H  . potassium chloride SA  20 mEq Oral Daily  . predniSONE  40 mg Oral Q breakfast  . warfarin  5 mg Oral ONCE-1800  . Warfarin - Pharmacist Dosing Inpatient   Does not apply q1800   Continuous Infusions: . sodium chloride 1,000 mL (06/17/18 1051)  . levofloxacin (LEVAQUIN) IV 750 mg (06/19/18 1732)   PRN Meds:.sodium chloride, acetaminophen **OR** acetaminophen, albuterol, LORazepam, morphine, ondansetron **OR** ondansetron (ZOFRAN) IV  DVT prophylaxis: On warfarin for A. fib Code Status: DNR Family Communication: No one at bedside Disposition Plan: Remains inpatient.  Final disposition likely residential hospice  Consultants:   Palliative care  Procedures:   None  Antimicrobials:  Doxycycline 1/26  Levaquin 1/27-  Objective: Vitals:   06/19/18 0554 06/19/18 1100 06/19/18 1130 06/19/18 1254  BP: 114/62 120/60  (!) 112/59  Pulse: (!) 54   60  Resp: 20   (!) 22  Temp: 97.8  F (36.6 C)   97.7 F (36.5 C)  TempSrc: Oral   Oral  SpO2: 99%  95% 98%  Height:        Intake/Output Summary (Last 24 hours) at 06/19/2018 1757 Last data filed at 06/19/2018 1754 Gross per 24 hour  Intake 840 ml  Output 1250 ml  Net -410 ml   Filed Weights    Examination:  GENERAL: Some increased work of breathing HEENT: MMM.  Vision and Hearing grossly intact.  NECK: Supple.  No JVD.  LUNGS: Pursed lip breathing, poor air movement and rhonchi bilaterally HEART:  RRR. Heart sounds normal.  ABD: Bowel sounds present. Soft. Non tender.  EXT:  no edema bilaterally.  SKIN: no apparent skin lesion.  NEURO: Awake, alert and oriented appropriately.  No gross deficit.  PSYCH: Calm. Normal affect.  Data Reviewed: I have independently reviewed following labs and imaging studies  CBC: Recent Labs  Lab 06/17/18 0903 06/18/18 0421  WBC 9.4 15.1*  NEUTROABS  --  12.7*  HGB 12.7* 11.6*  HCT 42.3 38.7*  MCV 95.1 93.5  PLT 214 235   Basic Metabolic Panel: Recent Labs  Lab 06/17/18 0903 06/18/18 0421  NA 142 137  K 4.0 3.9  CL 97* 96*  CO2 39* 32  GLUCOSE 93 99  BUN 34* 37*  CREATININE 1.10 1.22  CALCIUM 8.7* 8.3*   GFR: Estimated Creatinine Clearance: 51.2 mL/min (by C-G formula based on SCr of 1.22 mg/dL). Liver Function Tests: No results for input(s): AST, ALT, ALKPHOS, BILITOT, PROT, ALBUMIN in the last 168 hours. No results for input(s): LIPASE, AMYLASE in the last 168 hours. No results for input(s): AMMONIA in the last 168 hours. Coagulation Profile: Recent Labs  Lab 06/17/18 0907 06/18/18 0421 06/19/18 0448  INR 1.71 1.92 2.02   Cardiac Enzymes: Recent Labs  Lab 06/17/18 0907 06/17/18 1712 06/17/18 2247  TROPONINI 0.03* <0.03 0.03*   BNP (last 3 results) No results for input(s): PROBNP in the last 8760 hours. HbA1C: No results for input(s): HGBA1C in the last 72 hours. CBG: Recent Labs  Lab 06/18/18 2124 06/19/18 0745 06/19/18 0919  06/19/18 1155 06/19/18 1712  GLUCAP 77 69* 139* 166* 121*   Lipid Profile: No results for input(s): CHOL, HDL, LDLCALC, TRIG, CHOLHDL, LDLDIRECT in the last 72 hours. Thyroid Function Tests: No results for input(s): TSH, T4TOTAL, FREET4, T3FREE, THYROIDAB in the last 72 hours. Anemia Panel: No results for input(s): VITAMINB12, FOLATE, FERRITIN, TIBC, IRON, RETICCTPCT in the last 72 hours. Urine analysis:    Component Value Date/Time   COLORURINE YELLOW 06/07/2018 1256   APPEARANCEUR CLEAR 06/07/2018 1256   LABSPEC >1.046 (H) 06/07/2018 1256   PHURINE 7.0 06/07/2018 1256   GLUCOSEU 50 (A) 06/07/2018 1256   HGBUR NEGATIVE 06/07/2018 1256   BILIRUBINUR NEGATIVE 06/07/2018 1256   KETONESUR NEGATIVE 06/07/2018 1256   PROTEINUR NEGATIVE 06/07/2018 1256   UROBILINOGEN 0.2 10/19/2010 1150   NITRITE NEGATIVE 06/07/2018 1256   LEUKOCYTESUR NEGATIVE 06/07/2018 1256   Sepsis Labs: Invalid input(s): PROCALCITONIN, LACTICIDVEN  No results found for this or any previous visit (from the past 240 hour(s)).    Radiology Studies: No results found.  [facility] Pager [phone]  If 7PM-7AM, please contact night-coverage [email or web address] Password [facility] 06/19/2018, 5:57 PM

## 2018-06-19 NOTE — Progress Notes (Signed)
Initial Nutrition Assessment  DOCUMENTATION CODES:   Not applicable  INTERVENTION:    Ensure Enlive po BID, each supplement provides 350 kcal and 20 grams of protein  Obtain admission weight   NUTRITION DIAGNOSIS:   Increased nutrient needs related to acute illness as evidenced by estimated needs.  GOAL:   Patient will meet greater than or equal to 90% of their needs  MONITOR:   PO intake, Supplement acceptance, Weight trends, Labs, I & O's  REASON FOR ASSESSMENT:   Consult COPD Protocol  ASSESSMENT:   Patient with PMH significant for AAA, anxiety, depression, CAD, MI, CABG, diverticulosis, HTN, and COPD. Presents this admission with acute exacerbation of COPD.    RD consulted under COPD protocol. Attempted to see pt x3. He was unable to stay awake for discussion. Unsure of PO intake PTA. Meal completions charted as 50-100% for pt's last 5 meals. RD to provide Ensure to maximize calories and protein. Will try to speak with pt at follow up if possible.   An admission weight was not obtained. Will need to obtain recent wt to determine if pt has lost weight. Unable to complete Nutrition-Focused physical exam at this time.   PMT to meet with pt to discuss GOC.   Medications reviewed and include: Vit D, 40 mg lasix, SSI, 20 mEq KCl once daily, prednisone Labs reviewed: CBG 69-166   Diet Order:   Diet Order            Diet heart healthy/carb modified Room service appropriate? Yes; Fluid consistency: Thin  Diet effective now              EDUCATION NEEDS:   Not appropriate for education at this time  Skin:  Skin Assessment: Reviewed RN Assessment  Last BM:  1/27  Height:   Ht Readings from Last 1 Encounters:  06/17/18 6' (1.829 m)    Weight:   Wt Readings from Last 1 Encounters:  06/10/18 90 kg    Ideal Body Weight:  80.9 kg  BMI:  Body mass index is 26.91 kg/m.  Estimated Nutritional Needs:   Kcal:  2200-2400 kcal  Protein:  110-125  grams  Fluid:  >/= 2.2 L/day   Vanessa Kick RD, LDN Clinical Nutrition Pager # - [phone]

## 2018-06-19 NOTE — Progress Notes (Signed)
OT Cancellation Note  Patient Details Name: Roberto Reilly MRN: [id number] DOB: Aug 04, 1933   Cancelled Treatment:    Reason Eval/Treat Not Completed: Other (comment) OT order received, pt chart reviewed. Per PT and physician, pt is wanting to pursue possible residential hospice/end of life options and would not like to engage in therapy at this time. OT will sign off at this time per pt request, if pt is to change his mind, please re consult.   Dalphine Handing, MSOT, OTR/L Behavioral Health OT/ Acute Relief OT [facility] Office: [phone]  Dalphine Handing 06/19/2018, 4:56 PM

## 2018-06-19 NOTE — Progress Notes (Signed)
Palliative care brief note  Met with patient and his daughter.  Discussed options moving forward.  He reports being interested in hospice (likely residential).  Would like to consider options overnight.  Plan for f/u meeting tomorrow at 1230.  Full note to follow.  Micheline Rough, MD [place] Team [phone]

## 2018-06-19 NOTE — Progress Notes (Signed)
ANTICOAGULATION CONSULT NOTE - Follow Up Consult  Pharmacy Consult for warfarin Indication:hx atrial fibrillation  No Known Allergies  Patient Measurements: Height: 6' (182.9 cm) Weight: (pt refused to stand for weight and is in chair at this time) IBW/kg (Calculated) : 77.6 Heparin Dosing Weight:   Vital Signs: Temp: 97.8 F (36.6 C) (01/28 0554) Temp Source: Oral (01/28 0554) BP: 114/62 (01/28 0554) Pulse Rate: 54 (01/28 0554)  Labs: Recent Labs    06/17/18 0903 06/17/18 0907 06/17/18 1712 06/17/18 2247 06/18/18 0421 06/19/18 0448  HGB 12.7*  --   --   --  11.6*  --   HCT 42.3  --   --   --  38.7*  --   PLT 214  --   --   --  235  --   LABPROT  --  19.9*  --   --  21.7* 22.6*  INR  --  1.71  --   --  1.92 2.02  CREATININE 1.10  --   --   --  1.22  --   TROPONINI  --  0.03* <0.03 0.03*  --   --     Estimated Creatinine Clearance: 51.2 mL/min (by C-G formula based on SCr of 1.22 mg/dL).   Medications:  PTA warfarin regimen: 5 mg (2.5 mg bid) daily except 2.5 mg on TTSun (verified with pt's daughter)  Assessment: Patient is an 85 y.o with gx afib on warfarin PTA, presented to the ED on 06/17/18 with c/o SOB.  Warfarin resumed inpatient.  Today, 06/19/2018: - INR is therapeutic at 2.02. Pt refused to take warfarin on 1/27. - CBC ok (1/27) - no bleeding documented - drug-drug intxns: Levaquin can increase INR  Goal of Therapy:  INR 2-3 Monitor platelets by anticoagulation protocol: Yes   Plan:  - warfarin 5 mg PO x1  - monitor for s/s bleeding  Kevia Zaucha P 06/19/2018,8:54 AM

## 2018-06-19 NOTE — Progress Notes (Signed)
PT Cancellation Note/Discharge  Patient Details Name: Roberto Reilly MRN: [id number] DOB: [birth year]   Cancelled Treatment:    Reason Eval/Treat Not Completed: Other (comment).  Per MD pt is deciding to pursue residential hospice care.  I spoke with pt who agrees he did not want aggressive PT/OT treatments at this time.  I re-assured him that we are present and can be re-ordered if at any time he decides.  PT to sign off.  Thanks,  Rollene Rotunda. Meleah Demeyer, PT, DPT  Acute Rehabilitation [phone] pager [phone]) [phone] office     Rollene Rotunda Pacey Willadsen 06/19/2018, 5:04 PM

## 2018-06-20 DIAGNOSIS — J9621 Acute and chronic respiratory failure with hypoxia: Secondary | ICD-10-CM

## 2018-06-20 LAB — GLUCOSE, CAPILLARY
Glucose-Capillary: 85 mg/dL (ref 70–99)
Glucose-Capillary: 93 mg/dL (ref 70–99)
Glucose-Capillary: 128 mg/dL — ABNORMAL HIGH (ref 70–99)
Glucose-Capillary: 137 mg/dL — ABNORMAL HIGH (ref 70–99)

## 2018-06-20 LAB — PROTIME-INR
INR: 1.78
Prothrombin Time: 20.4 seconds — ABNORMAL HIGH (ref 11.4–15.2)

## 2018-06-20 MED ORDER — WARFARIN SODIUM 5 MG PO TABS
5.0000 mg | ORAL_TABLET | Freq: Once | ORAL | Status: AC
  Administered 2018-06-20: 5 mg via ORAL
  Filled 2018-06-20: qty 1

## 2018-06-20 NOTE — Progress Notes (Signed)
Daily Progress Note   Patient Name: Roberto Reilly       Date: 06/20/2018 DOB: [birth year]  Age: 85 y.o. MRN#: [id number] Attending Physician: Georgette Shell, MD Primary Care Physician: Jani Gravel, MD Admit Date: 06/17/2018  Reason for Consultation/Follow-up: Establishing goals of care  Subjective: Met with patient and his daughter in conjunction with Venia Carbon from [facility]  We discussed his clinical course as well as options for care moving forward.  We discussed difference between a aggressive medical intervention path and a palliative, comfort focused care path.  Roberto Reilly is clear in his desire to transition to residential hospice for full comfort care (including stopping supplemental oxygen).      Length of Stay: 2  Current Medications: Scheduled Meds:  . acetaZOLAMIDE  250 mg Oral BID  . amLODipine  10 mg Oral Daily  . aspirin EC  81 mg Oral Daily  . atorvastatin  40 mg Oral q1800  . budesonide (PULMICORT) nebulizer solution  0.5 mg Nebulization BID  . cholecalciferol  1,000 Units Oral Daily  . feeding supplement (ENSURE ENLIVE)  237 mL Oral BID BM  . furosemide  40 mg Oral Daily  . guaiFENesin  600 mg Oral BID  . insulin aspart  0-15 Units Subcutaneous TID WC  . ipratropium-albuterol  3 mL Nebulization QID  . metoprolol succinate  12.5 mg Oral Daily  . morphine  5 mg Oral Q6H  . potassium chloride SA  20 mEq Oral Daily  . predniSONE  40 mg Oral Q breakfast  . Warfarin - Pharmacist Dosing Inpatient   Does not apply q1800    Continuous Infusions: . sodium chloride 1,000 mL (06/17/18 1051)    PRN Meds: sodium chloride, acetaminophen **OR** acetaminophen, albuterol, LORazepam, morphine, ondansetron **OR** ondansetron (ZOFRAN) IV  Physical Exam     General: Alert,  awake, mild to moderate respiratory distress with increased work of breathing noted.  Heart: Regular rate and rhythm. No murmur appreciated. Lungs: Decreased air movement bilaterally, scattered rhonchi Abdomen: Soft, nontender, nondistended, positive bowel sounds.  Ext: No significant edema Skin: Warm and dry Neuro: Grossly intact, nonfocal.      Vital Signs: BP (!) 115/55 (BP Location: Right Arm)   Pulse 60   Temp 97.6 F (36.4 C) (Oral)   Resp (!) 23   Ht 6' (  1.829 m)   Wt 88.9 kg   SpO2 94%   BMI 26.58 kg/m  SpO2: SpO2: 94 % O2 Device: O2 Device: Nasal Cannula O2 Flow Rate: O2 Flow Rate (L/min): 4 L/min  Intake/output summary:   Intake/Output Summary (Last 24 hours) at 06/20/2018 1831 Last data filed at 06/20/2018 1538 Gross per 24 hour  Intake 480 ml  Output 400 ml  Net 80 ml   LBM: Last BM Date: 06/20/18 Baseline Weight: Weight: (pt refused to stand for weight and is in chair at this time) Most recent weight: Weight: 88.9 kg       Palliative Assessment/Data:    Flowsheet Rows     Most Recent Value  Intake Tab  Referral Department  Hospitalist  Unit at Time of Referral  Med/Surg Unit  Palliative Care Primary Diagnosis  Neurology  Date Notified  06/18/18  Palliative Care Type  Return patient Palliative Care  Reason for referral  Clarify Goals of Care  Date of Admission  06/17/18  # of days IP prior to Palliative referral  1  Clinical Assessment  Psychosocial & Spiritual Assessment  Palliative Care Outcomes      Patient Active Problem List   Diagnosis Date Noted  . Acute exacerbation of chronic obstructive pulmonary disease (COPD) ([place]) 06/07/2018  . Community acquired pneumonia 05/16/2018  . Counseling regarding end of life decision making 09/22/2017  . Acute respiratory failure with hypoxia ([place]) 02/21/2017  . Acute on chronic congestive heart failure ([place]) 02/21/2017  . Acute respiratory failure ([place]) 02/21/2017  . Chronic diastolic CHF  (congestive heart failure), NYHA class 2 ([place]) 01/17/2017  . Acute bronchitis due to respiratory syncytial virus (RSV) 05/20/2016  . Respiratory failure ([place]) 05/19/2016  . Shortness of breath 05/18/2016  . Chronic renal failure syndrome, stage 3 (moderate) (HCC) 05/18/2016  . Hypercapnia 05/18/2016  . Hyperglycemia   . Chronic osteomyelitis of right foot ([place]) 02/05/2016  . Acute and chronic respiratory failure with hypoxia ([place])   . Abnormal TSH 11/03/2015  . Pleural effusion 11/01/2015  . Acute renal failure (ARF) ([place]) 11/01/2015  . Sepsis ([place]) 11/01/2015  . Pneumonia 11/01/2015  . COPD exacerbation ([place]) 11/01/2015  . Encounter for long-term (current) use of other medications 01/07/2014  . Nasal sinus congestion 01/07/2014  . Diverticulosis of colon with hemorrhage 05/24/2013  . Internal hemorrhoids 05/24/2013  . AAA (abdominal aortic aneurysm) without rupture seen on CT scan 05/22/2013  . Rectal bleeding 05/21/2013  . Anemia 05/20/2013  . Generalized anxiety disorder 05/09/2013  . Auditory hallucination 05/09/2013  . COPD mixed type ([place]) 05/07/2013  . COPD with acute exacerbation ([place]) 05/07/2013  . Chest discomfort 05/07/2013  . Hx of scabies 04/29/2013  . Essential hypertension 04/29/2013  . Obesity (BMI 30-39.9) 01/18/2013  . Paroxysmal atrial fibrillation ([place]); CHA2DS2-VASc Score = 4. On Warfarin   . Dyslipidemia, goal LDL below 70   . Reactive depression (situational) 09/06/2011  . Coronary atherosclerosis 05/23/2007  . CAD in native artery - LM, RCA, RI & Cx disease --> CABG x 4 (LIMA-LAD, SVG-OM/RI, SVG-rPDA-rPL) 07/21/2005    Class: History of  . S/P CABG x 4 07/21/2005    Palliative Care Assessment & Plan   Patient Profile: 85 y.o. male  with past medical history of COPD on 4 L at baseline, CAD status post CABG, AAA, anxiety, depression, A. fib on warfarin and recent hospitalization earlier this month admitted on 06/17/2018 with COPD exacerbation.  Following  admission, patient reported that he was "ready" and  no longer wanting to pursue medical interventions for his end-stage COPD.  Palliative consulted for goals of care  Recommendations/Plan:  Continue current care while awaiting evaluation for potential transfer to residential hospice.  Plan for transition to residential hospice when bed available at which time supplemental oxygen and his other medications will be discontinued,  Goals of Care and Additional Recommendations:  Limitations on Scope of Treatment: Full Comfort Care on discharge  Code Status:    Code Status Orders  (From admission, onward)         Start     Ordered   06/17/18 1400  Do not attempt resuscitation (DNR)  Continuous    Question Answer Comment  In the event of cardiac or respiratory ARREST Do not call a "code blue"   In the event of cardiac or respiratory ARREST Do not perform Intubation, CPR, defibrillation or ACLS   In the event of cardiac or respiratory ARREST Use medication by any route, position, wound care, and other measures to relive pain and suffering. May use oxygen, suction and manual treatment of airway obstruction as needed for comfort.      06/17/18 1359        Code Status History    Date Active Date Inactive Code Status Order ID Comments User Context   06/17/2018 1356 06/17/2018 1359 DNR [id number]  Reubin Milan, MD ED   06/07/2018 1918 06/10/2018 1927 Full Code [id number]  Flora Lipps, MD Inpatient   06/07/2018 1333 06/07/2018 1918 DNR [id number]  Flora Lipps, MD ED   05/16/2018 0351 05/19/2018 2131 DNR [id number]  Rise Patience, MD Inpatient   07/05/2017 1617 07/06/2017 2333 DNR [id number]  Cristy Folks, MD Inpatient   02/21/2017 2322 02/25/2017 2012 DNR [id number]  Rise Patience, MD Inpatient   01/17/2017 1852 01/21/2017 1826 DNR [id number]  Mariel Aloe, MD Inpatient   01/17/2017 1606 01/17/2017 1852 DNR [id number]  Merrily Pew, MD ED   09/26/2016 1425 09/27/2016 2320 DNR  [id number]  Caren Griffins, MD Inpatient   05/18/2016 1937 05/22/2016 1902 DNR [id number]  Barton Dubois, MD ED   04/26/2016 1826 04/30/2016 1909 DNR [id number]  Annita Brod, MD Inpatient   11/01/2015 2251 11/05/2015 2047 DNR [id number]  Toy Baker, MD Inpatient   05/20/2013 2052 05/25/2013 2148 DNR [id number]  Quintella Baton, MD ED   05/07/2013 1721 05/09/2013 2231 Full Code [id number]  Velvet Bathe, MD Inpatient   04/29/2013 1636 05/01/2013 1728 Full Code [id number]  Allie Bossier, MD Inpatient    Advance Directive Documentation     Most Recent Value  Type of Advance Directive  Healthcare Power of Attorney, Living will  Pre-existing out of facility DNR order (yellow form or pink MOST form)  -  "MOST" Form in Place?  -       Prognosis:   Hours - Days most likely once he transitions to full comfort and discontinues use of his oxygen and other medications.  He will certainly need aggressive symptom management and will be best served by placement at residential hospice for EOL care.  Discharge Planning:  Hospice facility  Care plan was discussed with patient, daughter, [facility] liaison  Thank you for allowing the Palliative Medicine Team to assist in the care of this patient.   Time In: 1230 Time Out: 1315 Total Time 45 Prolonged Time Billed No      Greater than 50%  of this time was spent counseling and coordinating care related to the above assessment and plan.  Micheline Rough, MD  Please contact Palliative Medicine Team phone at [phone] for questions and concerns.

## 2018-06-20 NOTE — Care Management Note (Signed)
Case Management Note  Patient Details  Name: Roberto Reilly MRN: [id number] Date of Birth: 1934-05-23  Subjective/Objective:                    Action/Plan:Pt plan to discharge with [facility].    Expected Discharge Date:                  Expected Discharge Plan:  Home w Hospice Care  In-House Referral:     Discharge planning Services  CM Consult  Post Acute Care Choice:    Choice offered to:     DME Arranged:    DME Agency:     HH Arranged:  RN HH Agency:  [facility]  Status of Service:  Completed, signed off  If discussed at Long Length of Stay Meetings, dates discussed:    Additional CommentsGeni Bers, RN 06/20/2018, 11:15 AM

## 2018-06-20 NOTE — Progress Notes (Signed)
ANTICOAGULATION CONSULT NOTE - Follow Up Consult  Pharmacy Consult for warfarin Indication:hx atrial fibrillation  No Known Allergies  Patient Measurements: Height: 6' (182.9 cm) Weight: 196 lb (88.9 kg) IBW/kg (Calculated) : 77.6 Heparin Dosing Weight:   Vital Signs: Temp: 97.7 F (36.5 C) (01/29 0541) Temp Source: Oral (01/29 0541) BP: 112/95 (01/29 0743) Pulse Rate: 61 (01/29 0743)  Labs: Recent Labs    06/17/18 0903  06/17/18 0907 06/17/18 1712 06/17/18 2247 06/18/18 0421 06/19/18 0448 06/20/18 0447  HGB 12.7*  --   --   --   --  11.6*  --   --   HCT 42.3  --   --   --   --  38.7*  --   --   PLT 214  --   --   --   --  235  --   --   LABPROT  --    < > 19.9*  --   --  21.7* 22.6* 20.4*  INR  --    < > 1.71  --   --  1.92 2.02 1.78  CREATININE 1.10  --   --   --   --  1.22  --   --   TROPONINI  --   --  0.03* <0.03 0.03*  --   --   --    < > = values in this interval not displayed.    Estimated Creatinine Clearance: 51.2 mL/min (by C-G formula based on SCr of 1.22 mg/dL).   Medications:  PTA warfarin regimen: 5 mg (2.5 mg bid) daily except 2.5 mg on TTSun (verified with pt's daughter)  Assessment: Patient is an 85 y.o with gx afib on warfarin PTA, presented to the ED on 06/17/18 with c/o SOB.  Warfarin resumed inpatient.  Today, 06/20/2018: - INR is sub-therapeutic at 1.78 with dose not taken on 1/27.  - CBC ok (1/27) - no bleeding documented - drug-drug intxns: Levaquin can increase INR  Goal of Therapy:  INR 2-3 Monitor platelets by anticoagulation protocol: Yes   Plan:  - repeat warfarin 5 mg PO x1 today.  If INR continues to trend down on 1/30, then will plan on increasing dose - monitor for s/s bleeding  Zaydn Gutridge P 06/20/2018,8:05 AM

## 2018-06-20 NOTE — Progress Notes (Signed)
PROGRESS NOTE    Roberto Reilly  HUO:[id number] DOB: [birth year] DOA: 06/17/2018 PCP: Pearson Grippe, MD  Brief Narrative:85 year old male with history of COPD on 4 L at baseline, CAD status post CABG, history of AAA, anxiety, depression, A. fib on warfarin presented with worsening dyspnea, cough and sputum productions concerning for COPD exacerbation.  Recently treated with prednisone and albuterol outpatient about 3 weeks ago without significant improvement.  Started on steroid, antibiotic and DuoNeb.  Pulmicort added this morning.  CTA negative for PE but showed RUL opacity.  On 1/27: patient refused to wear his oxygen saying, "I am ready".  His oxygen saturation dropped to 81%. Palliative care consulted.  Assessment & Plan:   Principal Problem:   Acute exacerbation of chronic obstructive pulmonary disease (COPD) (HCC) Active Problems:   Coronary atherosclerosis   Paroxysmal atrial fibrillation (HCC); CHA2DS2-VASc Score = 4. On Warfarin   Dyslipidemia, goal LDL below 70   Essential hypertension   Hypercapnia   Hyperglycemia   Chronic diastolic CHF (congestive heart failure), NYHA class 2 (HCC)  Recurrent  exacerbation of chronic COPD: Continue oxygen afebrile labs, continue steroids breathing treatments till final decision made about hospice.  Patient reports he is fed up all the same treatments and living like this.  CAD status post CABG: Currently without chest pain. -Continue beta-blocker (started here) and home medications  Paroxysmal atrial fibrillation without RVR: -Continue metoprolol and warfarin pending palliative recommendation.  Hypertension/hyperlipidemia: -Continue home meds for now.  Chronic diastolic CHF Echo on 117/20 with EF of 55%, inadequate LV wall motion and diastolic function assessment due to A. fib.  D-shaped interventricular septum.  Peak RV-RA gradient of 30 mmHg. -Continue p.o. Lasix -Daily weight, intake output and renal function until further plan with  his care.        Nutrition Problem: Increased nutrient needs Etiology: acute illness     Signs/Symptoms: estimated needs    Interventions: Ensure Enlive (each supplement provides 350kcal and 20 grams of protein)  Estimated body mass index is 26.58 kg/m as calculated from the following:   Height as of this encounter: 6' (1.829 m).   Weight as of this encounter: 88.9 kg.  DVT prophylaxis: Coumadin Code Status: DO NOT RESUSCITATE Family Communication: None at bedside Disposition Plan: Pending palliative care discussion with patient and his daughter reviewed the notes from [facility].  Consultants:  Palliative care  Procedures: None Antimicrobials: Levaquin stopped 1/29 doxycycline stopped 1/26  Subjective: Up in chair asking for breakfast frustrated not getting breakfast frustrated about having to use oxygen  Objective: Vitals:   06/20/18 0541 06/20/18 0546 06/20/18 0743 06/20/18 1256  BP: (!) 121/54  (!) 112/95 (!) 115/55  Pulse: (!) 43 (!) 53 61 60  Resp: (!) 22  20 (!) 23  Temp: 97.7 F (36.5 C)   97.6 F (36.4 C)  TempSrc: Oral   Oral  SpO2: 99%  92% 94%  Weight:  88.9 kg    Height:        Intake/Output Summary (Last 24 hours) at 06/20/2018 1310 Last data filed at 06/20/2018 0900 Gross per 24 hour  Intake 600 ml  Output -  Net 600 ml   Filed Weights   06/20/18 0546  Weight: 88.9 kg    Examination:  General exam: Appears calm and comfortable  Respiratory system: Clear to auscultation. Respiratory effort normal. Cardiovascular system: S1 & S2 heard, RRR. No JVD, murmurs, rubs, gallops or clicks. No pedal edema. Gastrointestinal system: Abdomen is nondistended, soft and  nontender. No organomegaly or masses felt. Normal bowel sounds heard. Central nervous system: Alert and oriented. No focal neurological deficits. Extremities: Symmetric 5 x 5 power. Skin: No rashes, lesions or ulcers Psychiatry: Judgement and insight appear normal. Mood  & affect appropriate.     Data Reviewed: I have personally reviewed following labs and imaging studies  CBC: Recent Labs  Lab 06/17/18 0903 06/18/18 0421  WBC 9.4 15.1*  NEUTROABS  --  12.7*  HGB 12.7* 11.6*  HCT 42.3 38.7*  MCV 95.1 93.5  PLT 214 235   Basic Metabolic Panel: Recent Labs  Lab 06/17/18 0903 06/18/18 0421  NA 142 137  K 4.0 3.9  CL 97* 96*  CO2 39* 32  GLUCOSE 93 99  BUN 34* 37*  CREATININE 1.10 1.22  CALCIUM 8.7* 8.3*   GFR: Estimated Creatinine Clearance: 51.2 mL/min (by C-G formula based on SCr of 1.22 mg/dL). Liver Function Tests: No results for input(s): AST, ALT, ALKPHOS, BILITOT, PROT, ALBUMIN in the last 168 hours. No results for input(s): LIPASE, AMYLASE in the last 168 hours. No results for input(s): AMMONIA in the last 168 hours. Coagulation Profile: Recent Labs  Lab 06/17/18 0907 06/18/18 0421 06/19/18 0448 06/20/18 0447  INR 1.71 1.92 2.02 1.78   Cardiac Enzymes: Recent Labs  Lab 06/17/18 0907 06/17/18 1712 06/17/18 2247  TROPONINI 0.03* <0.03 0.03*   BNP (last 3 results) No results for input(s): PROBNP in the last 8760 hours. HbA1C: No results for input(s): HGBA1C in the last 72 hours. CBG: Recent Labs  Lab 06/19/18 1155 06/19/18 1712 06/19/18 2203 06/20/18 0737 06/20/18 1148  GLUCAP 166* 121* 87 85 93   Lipid Profile: No results for input(s): CHOL, HDL, LDLCALC, TRIG, CHOLHDL, LDLDIRECT in the last 72 hours. Thyroid Function Tests: No results for input(s): TSH, T4TOTAL, FREET4, T3FREE, THYROIDAB in the last 72 hours. Anemia Panel: No results for input(s): VITAMINB12, FOLATE, FERRITIN, TIBC, IRON, RETICCTPCT in the last 72 hours. Sepsis Labs: No results for input(s): PROCALCITON, LATICACIDVEN in the last 168 hours.  No results found for this or any previous visit (from the past 240 hour(s)).       Radiology Studies: No results found.      Scheduled Meds: . acetaZOLAMIDE  250 mg Oral BID  .  amLODipine  10 mg Oral Daily  . aspirin EC  81 mg Oral Daily  . atorvastatin  40 mg Oral q1800  . budesonide (PULMICORT) nebulizer solution  0.5 mg Nebulization BID  . cholecalciferol  1,000 Units Oral Daily  . feeding supplement (ENSURE ENLIVE)  237 mL Oral BID BM  . furosemide  40 mg Oral Daily  . guaiFENesin  600 mg Oral BID  . insulin aspart  0-15 Units Subcutaneous TID WC  . ipratropium-albuterol  3 mL Nebulization QID  . metoprolol succinate  12.5 mg Oral Daily  . morphine  5 mg Oral Q6H  . potassium chloride SA  20 mEq Oral Daily  . predniSONE  40 mg Oral Q breakfast  . warfarin  5 mg Oral ONCE-1800  . Warfarin - Pharmacist Dosing Inpatient   Does not apply q1800   Continuous Infusions: . sodium chloride 1,000 mL (06/17/18 1051)     LOS: 2 days     Alwyn Ren, MD [facility]  If 7PM-7AM, please contact night-coverage [email or web address] Password [email or web address] 06/20/2018, 1:10 PM

## 2018-06-20 NOTE — Progress Notes (Signed)
CSW will assist with the patient transfer to [facility] on 1/30.   Vivi Barrack, Alexander Mt, MSW Clinical Social Worker  [phone] 06/20/2018  4:19 PM

## 2018-06-20 NOTE — Consult Note (Signed)
Consultation Note Date: 06/20/2018   Patient Name: Roberto Reilly  DOB: [birth year]  MRN: [id number]  Age / Sex: 85 y.o., male  PCP: Roberto Gravel, MD Referring Physician: Georgette Shell, MD  Reason for Consultation: Establishing goals of care  HPI/Patient Profile: 85 y.o. male  with past medical history of COPD on 4 L at baseline, CAD status post CABG, AAA, anxiety, depression, A. fib on warfarin and recent hospitalization earlier this month admitted on 06/17/2018 with COPD exacerbation.  Following admission, patient reported that he was "ready" and no longer wanting to pursue medical interventions for his end-stage COPD.  Palliative consulted for goals of care  Met today with Roberto Reilly and his daughter.  He states that he is "tired" and no longer wanting to fight and continue to receive aggressive medical interventions.  We talked about the fact that he lost his wife and has continued to have significant decline in his functional status at home.  He reports being largely bedbound or chair bound and that he "cannot even make myself a sandwich."  He states that he is "completely helpless" and "tired of living this way."  He tells me that he is right with God and is "ready to go."  He states that he feels better symptomatically than when he first arrived at the hospital since being on steroids.  He also initially was refusing to wear oxygen but has been wearing it today.  SUMMARY OF RECOMMENDATIONS   - We had a long discussion regarding possible pathways forward including: Home with home health, pursuing SNF placement with plan for trial of rehab, transitioning home with hospice support, or transitioning to residential hospice.  His daughter then met me in the hall and asked also about going to live at skilled facility without planning for rehab and we discussed that this would be an option if he is able to  private pay for room or he would need to have a payer source for long-term care such as Medicaid. - Following our conversation, Roberto Reilly reports that he thinks that he would like to stop pursuing any medical interventions, including wearing his oxygen, with a focus on comfort. -He and his daughter would like to discuss possible pathways forward this evening and we will plan for follow-up meeting tomorrow at 1230.  I will reach out to hospice per his request to discuss possibility of residential hospice placement with them.  Code Status/Advance Care Planning:  DNR   Symptom Management:   Shortness of breath: He reports that this limits his activities and he also feels miserable due to having constant shortness of breath.  We discussed opioids as medication of choice for dyspnea.  Plan for scheduled opioid every 6 hours with breakthrough medication every 2 hours as needed.  Palliative Prophylaxis:   Frequent Pain Assessment  Additional Recommendations (Limitations, Scope, Preferences):  Avoid Hospitalization  Psycho-social/Spiritual:   Desire for further Chaplaincy support:yes  Additional Recommendations: Education on Hospice  Prognosis:   Unable to determine as this will depend upon  his care plan moving forward  Discharge Planning: To Be Determined      Primary Diagnoses: Present on Admission: . Acute exacerbation of chronic obstructive pulmonary disease (COPD) ([place]) . Coronary atherosclerosis . Paroxysmal atrial fibrillation ([place]); CHA2DS2-VASc Score = 4. On Warfarin . Dyslipidemia, goal LDL below 70 . Essential hypertension . Chronic diastolic CHF (congestive heart failure), NYHA class 2 ([place]) . Hypercapnia . Hyperglycemia   I have reviewed the medical record, interviewed the patient and family, and examined the patient. The following aspects are pertinent.  Past Medical History:  Diagnosis Date  . AAA (abdominal aortic aneurysm) (HCC)    4.2 X 4.8 cm 04/2013  CT; declined re-eval 02/25/15  . Anxiety   . CAD in native artery March 2007   Cath for dyspnea on exertion: 75% distal LM, 85% RI, 95% mid-distal Cx, in multiple RCA lesions with 95% distal. --> Referred for CABG; has declined further noninvasive evaluation in the absence of worsening symptoms  . COPD (chronic obstructive pulmonary disease) with emphysema ([place]) 05/23/2007   Hosp 5/29-6/01/12- COPD exacerbation ONOX 12/08/10- desaturated to less than 88% for over an hour, qualifying for home O2 during sleep    . Diverticulosis of colon with hemorrhage 05/24/2013  . Dyslipidemia, goal LDL below 70   . History of non-ST Elevation MI (myocardial infarction) March 2007   Admitted with COPD exacerbation, given by CHF. Had some chest pain with positive troponins. Cath showed multivessel disease, referred for CABG  . History of pneumonia   . Hypertension   . Hypoxia     chronic, on home O2  . Obesity (BMI 30-39.9)    One year ago weighed 265 pounds, (12/2012) -- now 234 pounds  . PAF (paroxysmal atrial fibrillation) (HCC)    Anticoagulated on warfarin. Stable -- post op  . Pneumonia    hx  . 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 Seasonal allergies   . Shortness of breath dyspnea    Social History   Socioeconomic History  . Marital status: Married    Spouse name: Not on file  . Number of children: 2  . Years of education: Not on file  . Highest education level: Not on file  Occupational History  . Occupation: Administrator, arts  Social Needs  . Financial resource strain: Not on file  . Food insecurity:    Worry: Not on file    Inability: Not on file  . Transportation needs:    Medical: Not on file    Non-medical: Not on file  Tobacco Use  . Smoking status: Former Smoker    Packs/day: 2.00    Years: 55.00    Pack years: 110.00    Types: Cigarettes    Last attempt to quit: 05/23/1998    Years since quitting: 20.0  . Smokeless tobacco: Never Used  Substance and  Sexual Activity  . Alcohol use: No  . Drug use: No  . Sexual activity: Not on file  Lifestyle  . Physical activity:    Days per week: Not on file    Minutes per session: Not on file  . Stress: Not on file  Relationships  . Social connections:    Talks on phone: Not on file    Gets together: Not on file    Attends religious service: Not on file    Active member of club or organization: Not on file    Attends meetings of clubs or organizations: Not  on file    Relationship status: Not on file  Other Topics Concern  . Not on file  Social History Narrative   He is a married father of 2, grandfather of 53, does not get routine exercise. He is a former smoker, but does not currently or not drink alcohol.    He is modifying his diet and trying to get activity but is doing better with the diet than the activity.    He is under a lot of stress.    He was the caregiver for his wife, who was in poor health. She died Oct 26, 2015.   Wears DNR bracelet.    Family History  Problem Relation Age of Onset  . Heart attack Mother   . Heart attack Father    Scheduled Meds: . acetaZOLAMIDE  250 mg Oral BID  . amLODipine  10 mg Oral Daily  . aspirin EC  81 mg Oral Daily  . atorvastatin  40 mg Oral q1800  . budesonide (PULMICORT) nebulizer solution  0.5 mg Nebulization BID  . cholecalciferol  1,000 Units Oral Daily  . feeding supplement (ENSURE ENLIVE)  237 mL Oral BID BM  . furosemide  40 mg Oral Daily  . guaiFENesin  600 mg Oral BID  . insulin aspart  0-15 Units Subcutaneous TID WC  . ipratropium-albuterol  3 mL Nebulization QID  . metoprolol succinate  12.5 mg Oral Daily  . morphine  5 mg Oral Q6H  . potassium chloride SA  20 mEq Oral Daily  . predniSONE  40 mg Oral Q breakfast  . warfarin  5 mg Oral ONCE-1800  . Warfarin - Pharmacist Dosing Inpatient   Does not apply q1800   Continuous Infusions: . sodium chloride 1,000 mL (06/17/18 1051)   PRN Meds:.sodium chloride, acetaminophen  **OR** acetaminophen, albuterol, LORazepam, morphine, ondansetron **OR** ondansetron (ZOFRAN) IV Medications Prior to Admission:  Prior to Admission medications   Medication Sig Start Date End Date Taking? Authorizing Provider  amLODipine (NORVASC) 10 MG tablet Take 1 tablet (10 mg total) by mouth daily. 05/01/13  Yes Theodis Blaze, MD  aspirin EC 81 MG tablet Take 81 mg by mouth every morning.    Yes [provider]  atorvastatin (LIPITOR) 40 MG tablet Take 1 tablet (40 mg total) by mouth daily. 11/20/17  Yes Leonie Man, MD  budesonide-formoterol [facility]) 160-4.5 MCG/ACT inhaler Inhale 2 puffs into the lungs 2 (two) times daily. 12/07/17  Yes Young, Tarri Fuller D, MD  cholecalciferol (VITAMIN D) 1000 UNITS tablet Take 1,000 Units by mouth every morning.    Yes [provider]  furosemide (LASIX) 40 MG tablet Take 1 tablet (40 mg total) daily by mouth. May take an extra 40 mg tablet daily if weight is above 213 lbs ,if needed 04/03/17  Yes Leonie Man, MD  guaiFENesin (MUCINEX) 600 MG 12 hr tablet Take 1 tablet (600 mg total) by mouth 2 (two) times daily. 06/10/18  Yes Roberto Shell, MD  ipratropium-albuterol (DUONEB) 0.5-2.5 (3) MG/3ML SOLN Take 3 mLs by nebulization every 4 (four) hours as needed (wheezing and shortness of breath).    Yes [provider]  potassium chloride SA (K-DUR,KLOR-CON) 20 MEQ tablet Take 1 tablet (20 mEq total) by mouth daily. 05/07/18  Yes Leonie Man, MD  predniSONE (DELTASONE) 10 MG tablet Take 40 mg that is 4 tablets daily for the first 4 days then 3 tablets daily for the following 4 days then 2 tablets daily for the following  4 days and then 1 tablet daily till done. Patient taking differently: Take 10-40 mg by mouth See admin instructions. Take 40 mg by mouth daily for the first 4 days then 30 mg daily for the following 4 days then 20 mg daily for the following 4 days and then 10 mg daily till done. 06/10/18  Yes Roberto Shell, MD  warfarin (COUMADIN) 2.5 MG tablet TAKE ONE TO TWO TABLETS BY MOUTH AS INSTRUCTED BY COUMADIN CLINIC Patient taking differently: Take 2.5 mg by mouth See admin instructions. Take 2.5 mg by mouth in the morning on Sunday,Tuesday and Thursday. Take 2.5 mg by mouth twice daily at 0830 and 1600 on Monday, Wednesday, Friday, and Saturday 01/30/18  Yes Leonie Man, MD  doxycycline (VIBRA-TABS) 100 MG tablet Take 1 tablet (100 mg total) by mouth every 12 (twelve) hours. Patient not taking: Reported on 06/17/2018 06/09/18   Roberto Shell, MD   No Known Allergies Review of Systems  Constitutional: Positive for activity change and fatigue.  Respiratory: Positive for cough and shortness of breath.   Neurological: Positive for weakness.    Physical Exam  General: Alert, awake, mild to moderate respiratory distress with increased work of breathing noted.  Heart: Regular rate and rhythm. No murmur appreciated. Lungs: Decreased air movement bilaterally, scattered rhonchi Abdomen: Soft, nontender, nondistended, positive bowel sounds.  Ext: No significant edema Skin: Warm and dry Neuro: Grossly intact, nonfocal.  Vital Signs: BP (!) 112/95 (BP Location: Left Arm)   Pulse 61   Temp 97.7 F (36.5 C) (Oral)   Resp 20   Ht 6' (1.829 m)   Wt 88.9 kg   SpO2 92%   BMI 26.58 kg/m  Pain Scale: 0-10   Pain Score: 0-No pain   SpO2: SpO2: 92 % O2 Device:SpO2: 92 % O2 Flow Rate: .O2 Flow Rate (L/min): 3.5 L/min  IO: Intake/output summary:   Intake/Output Summary (Last 24 hours) at 06/20/2018 0941 Last data filed at 06/20/2018 0600 Gross per 24 hour  Intake 480 ml  Output -  Net 480 ml    LBM: Last BM Date: 06/20/18 Baseline Weight: Weight: (pt refused to stand for weight and is in chair at this time) Most recent weight: Weight: 88.9 kg     Palliative Assessment/Data:   Flowsheet Rows     Most Recent Value  Intake Tab  Referral Department  Hospitalist  Unit at  Time of Referral  Med/Surg Unit  Palliative Care Primary Diagnosis  Neurology  Date Notified  06/18/18  Palliative Care Type  Return patient Palliative Care  Reason for referral  Clarify Goals of Care  Date of Admission  06/17/18  # of days IP prior to Palliative referral  1  Clinical Assessment  Psychosocial & Spiritual Assessment  Palliative Care Outcomes      Time In: 9233 Time Out: 1700 Time Total: 75 Greater than 50%  of this time was spent counseling and coordinating care related to the above assessment and plan.  Signed by: Micheline Rough, MD   Please contact Palliative Medicine Team phone at [phone] for questions and concerns.  For individual provider: See Shea Evans

## 2018-06-20 NOTE — Progress Notes (Addendum)
[facility] [address])   Received request from MD to meet with patient and daughterto answer questions related to residential hospice.  Pt has had options presented several times, he has not wavered with wanting to stop all medical treatment, including oxygen, and focus on comfort until he passes.  Pt adamant that his decision has been made and he desires to be with the Reita Cliche and his wife.  Explained services and hospice philosophy, family without questions.  Chart reviewed, and we have a bed to offer Roberto Reilly on 1/30.  Will arrange to complete registration paperwork with daughter Roberto Reilly and update LCSW with plan.  **1530- met with daughter Roberto Reilly, completed registration paperwork for transfer to [facility] on 1/30.  Dr. Orpah Melter to assume care of pt once at [facility].  LCSW updated.  RN staff:  Please call report on 1/30 to [phone]  Please fax discharge summary to:  [phone]   Venia Carbon BSN, RN [facility] Liaison (listed in [place]) [phone]

## 2018-06-21 DIAGNOSIS — I5032 Chronic diastolic (congestive) heart failure: Secondary | ICD-10-CM

## 2018-06-21 LAB — PROTIME-INR
INR: 1.89
Prothrombin Time: 21.5 seconds — ABNORMAL HIGH (ref 11.4–15.2)

## 2018-06-21 LAB — GLUCOSE, CAPILLARY: Glucose-Capillary: 94 mg/dL (ref 70–99)

## 2018-06-21 MED ORDER — MORPHINE SULFATE 10 MG/5ML PO SOLN: 5.0000 mg | Freq: Four times a day (QID) | ORAL | 0 refills | Status: AC

## 2018-06-21 MED ORDER — MORPHINE SULFATE 10 MG/5ML PO SOLN: 5.0000 mg | ORAL | 0 refills | Status: AC | PRN

## 2018-06-21 MED ORDER — WARFARIN SODIUM 2.5 MG PO TABS: 2.5000 mg | ORAL_TABLET | Freq: Once | ORAL | Status: DC

## 2018-06-21 NOTE — Progress Notes (Addendum)
Patient brady on tele, triggering MEWS score to increase to 2.  Not an acute change and patient asymptomatic. Plan to transfer to [facility] today, patient wishes to be comfort care - will not initiate increased VS

## 2018-06-21 NOTE — Progress Notes (Signed)
[facility] room available for Mr. Coniglio today. Paper work completed yesterday.   Please send discharge summary to [phone].  RN please call report to [phone].  Thank you,  Forrestine Him, LCSW [phone]

## 2018-06-21 NOTE — Progress Notes (Signed)
[facility] to transport.  DNR signed.   Vivi Barrack, Alexander Mt, MSW Clinical Social Worker  [phone] 06/21/2018  11:43 AM

## 2018-06-21 NOTE — Discharge Summary (Signed)
Physician Discharge Summary  Karna ChristmasJames Mccarrick ZOX:[id number] DOB: 03/14/1934 DOA: 06/17/2018  PCP: Pearson GrippeKim, Rondarius, MD  Admit date: 06/17/2018 Discharge date: 06/21/2018  Admitted From: Home Disposition: [facility]  Home Health none Equipment/Devices: None  Discharge Condition hospice discharge CODE STATUS: DO NOT RESUSCITATE Diet recommendation: Regular diet Brief/Interim Summary:85 year old male with history of COPD on 4 L at baseline, CAD status post CABG, history of AAA, anxiety, depression, A. fib on warfarin presented with worsening dyspnea, cough and sputum productions concerning for COPD exacerbation. Recently treated with prednisone and albuterol outpatient about 3 weeks ago without significant improvement. Started on steroid, antibiotic and DuoNeb. Pulmicort added this morning. CTA negative for PE but showed RUL opacity.  On 1/27:patient refused to wear his oxygen saying, "I am ready". His oxygen saturation dropped to81%. Palliative care consulted.  Discharge Diagnoses:  Principal Problem:   Acute exacerbation of chronic obstructive pulmonary disease (COPD) (HCC) Active Problems:   Coronary atherosclerosis   Paroxysmal atrial fibrillation (HCC); CHA2DS2-VASc Score = 4. On Warfarin   Dyslipidemia, goal LDL below 70   Essential hypertension   Hypercapnia   Hyperglycemia   Chronic diastolic CHF (congestive heart failure), NYHA class 2 (HCC)  #1 end-stage COPD chronic COPD O2 dependent with history of CAD CABG paroxysmal atrial fibrillation and diastolic heart failure-has had recurrent hospital admissions for the same.  During this admission patient reported that he was ready to give up all this and to be with his [place] and the [place].  He does not want any further medical interventions to keep him alive.  Patient will be discharged to [facility] for comfort care.      Nutrition Problem: Increased nutrient needs Etiology: acute illness    Signs/Symptoms: estimated  needs     Interventions: Ensure Enlive (each supplement provides 350kcal and 20 grams of protein)  Estimated body mass index is 26.58 kg/m as calculated from the following:   Height as of this encounter: 6' (1.829 m).   Weight as of this encounter: 88.9 kg.  Discharge Instructions  Discharge Instructions    Call MD for:  persistant dizziness or light-headedness   Complete by:  As directed    Call MD for:  persistant nausea and vomiting   Complete by:  As directed    Call MD for:  redness, tenderness, or signs of infection (pain, swelling, redness, odor or green/yellow discharge around incision site)   Complete by:  As directed    Diet - low sodium heart healthy   Complete by:  As directed    Increase activity slowly   Complete by:  As directed      Allergies as of 06/21/2018   No Known Allergies     Medication List    STOP taking these medications   amLODipine 10 MG tablet Commonly known as:  NORVASC   aspirin EC 81 MG tablet   atorvastatin 40 MG tablet Commonly known as:  LIPITOR   budesonide-formoterol 160-4.5 MCG/ACT inhaler Commonly known as:  SYMBICORT   cholecalciferol 1000 units tablet Commonly known as:  VITAMIN D   doxycycline 100 MG tablet Commonly known as:  VIBRA-TABS   DUONEB 0.5-2.5 (3) MG/3ML Soln Generic drug:  ipratropium-albuterol   furosemide 40 MG tablet Commonly known as:  LASIX   guaiFENesin 600 MG 12 hr tablet Commonly known as:  MUCINEX   potassium chloride SA 20 MEQ tablet Commonly known as:  K-DUR,KLOR-CON   predniSONE 10 MG tablet Commonly known as:  DELTASONE   warfarin 2.5  MG tablet Commonly known as:  COUMADIN     TAKE these medications   morphine 10 MG/5ML solution Take 2.5 mLs (5 mg total) by mouth every 2 (two) hours as needed for moderate pain (or shortness of breath).   morphine 10 MG/5ML solution Take 2.5 mLs (5 mg total) by mouth every 6 (six) hours.       No Known  Allergies  Consultations: Palliative care  Procedures/Studies: Ct Chest W Contrast  Result Date: 06/07/2018 CLINICAL DATA:  Dyspnea. Productive cough. Reported history of right lower lobectomy for hamartoma. EXAM: CT CHEST WITH CONTRAST TECHNIQUE: Multidetector CT imaging of the chest was performed during intravenous contrast administration. CONTRAST:  64mL OMNIPAQUE IOHEXOL 300 MG/ML  SOLN COMPARISON:  Chest radiograph from earlier today. 08/19/2005 chest CT. FINDINGS: Cardiovascular: Top-normal heart size. No significant pericardial effusion/thickening. Three-vessel coronary atherosclerosis status post CABG. Atherosclerotic nonaneurysmal thoracic aorta. Dilated main pulmonary artery (3.9 cm diameter), mildly increased from 3.6 cm on 2007 chest CT. No central pulmonary emboli. Mediastinum/Nodes: No discrete thyroid nodules. Unremarkable esophagus. No pathologically enlarged axillary, mediastinal or hilar lymph nodes. Lungs/Pleura: No pneumothorax. No pleural effusion. Status post right lower lobectomy. Right upper lobe 1.8 cm ground-glass pulmonary nodule (series 7/image 50), new since 08/19/2005 chest CT. Occlusion of proximal right middle lobe bronchus by nonspecific material. Patchy tree-in-bud opacities in the right middle lobe and basilar right upper lobe. Solid 7 mm basilar right lower lobe pulmonary nodule (series 7/image 91), stable since 05/20/2013 CT abdomen study, considered benign. Calcified 9 mm anterior left upper lobe granuloma. No acute consolidative airspace disease or lung masses. Mild centrilobular emphysema with diffuse bronchial wall thickening. Upper abdomen: Incomplete visualization of known infrarenal abdominal aortic aneurysm. Nonobstructing 6 mm interpolar right renal stone. Musculoskeletal: No aggressive appearing focal osseous lesions. Intact sternotomy wires. Marked thoracic spondylosis. IMPRESSION: 1. Mild centrilobular emphysema with diffuse bronchial wall thickening,  suggesting COPD. 2. Status post right lower lobectomy. Occlusion of the proximal right middle lobe bronchus by nonspecific material, probably mucoid impaction, difficult to exclude underlying endobronchial lesion. Recommend attention on follow-up chest CT versus bronchoscopic evaluation. 3. Mild patchy tree-in-bud opacities in the right middle and basilar right upper lobes, compatible with nonspecific infectious or inflammatory bronchiolitis. No acute consolidative airspace disease to suggest a pneumonia. 4. Ground-glass 1.8 cm right upper lobe pulmonary nodule, new since 2007 chest CT. Initial follow-up with CT at 6-12 months is recommended to confirm persistence. If persistent, repeat CT is recommended every 2 years until 5 years of stability has been established. This recommendation follows the consensus statement: Guidelines for Management of Incidental Pulmonary Nodules Detected on CT Images:From the Fleischner Society 2017; published online before print (10.1148/radiol.3143888757). 5. Stable dilated main pulmonary artery, suggesting chronic pulmonary arterial hypertension. 6. Nonobstructing right nephrolithiasis. Aortic Atherosclerosis (ICD10-I70.0) and Emphysema (ICD10-J43.9). Electronically Signed   By: Delbert Phenix M.D.   On: 06/07/2018 10:58   Ct Angio Chest Pe W And/or Wo Contrast  Result Date: 06/17/2018 CLINICAL DATA:  Shortness of breath and productive cough for the past 3 weeks. History of COPD. EXAM: CT ANGIOGRAPHY CHEST WITH CONTRAST TECHNIQUE: Multidetector CT imaging of the chest was performed using the standard protocol during bolus administration of intravenous contrast. Multiplanar CT image reconstructions and MIPs were obtained to evaluate the vascular anatomy. CONTRAST:  ISOVUE-370 IOPAMIDOL (ISOVUE-370) INJECTION 76% COMPARISON:  Portable chest obtained today. Chest CT dated 06/07/2018. FINDINGS: Cardiovascular: Atheromatous calcifications, including the coronary arteries and  aorta. Normally opacified pulmonary arteries with no pulmonary  arterial filling defects seen. Stable dilated main pulmonary artery. Mediastinum/Nodes: No enlarged mediastinal, hilar, or axillary lymph nodes. Thyroid gland, trachea, and esophagus demonstrate no significant findings. Lungs/Pleura: The previously demonstrated 1.8 x 1.8 cm ground-glass opacity in the right upper lobe is slightly larger, currently measuring 2.2 x 1.6 cm on image number 51 of series 7. Post right lower lobectomy changes are again demonstrated with slightly less prominent density in the proximal right middle lobe bronchus, with scattered air within this area. The previously demonstrated chronic 7 mm right posterior basal right upper lobe is unchanged on image number 93 series 7 and previously shown stable since 2014, compatible with a benign process. Mild tree in bud opacities in that portion of the lung have not changed significantly. No pleural fluid. Upper Abdomen: Unremarkable. Musculoskeletal: Thoracic spine degenerative changes. Old bilateral rib fractures. Review of the MIP images confirms the above findings. IMPRESSION: 1. No pulmonary emboli or acute abnormality. 2. Mild increase in size of a ground-glass opacity in the right upper lobe, currently measuring 2.2 x 1.6 cm. A follow-up chest CT is recommended in 6 months. 3. Stable mild chronic tree in bud opacities in the basilar portion of the right upper lobe, suspicious for a mild active infectious process. 4. Stable dilated main pulmonary artery compatible with pulmonary arterial hypertension. 5. Slightly improved probable mucous plugging in the right middle lobe bronchus. 6. Status post right lower lobectomy. 7. Calcific coronary artery and aortic atherosclerosis. Aortic Atherosclerosis (ICD10-I70.0). Electronically Signed   By: Beckie Salts M.D.   On: 06/17/2018 11:27   Dg Chest Portable 1 View  Result Date: 06/17/2018 CLINICAL DATA:  Shortness of breath EXAM: PORTABLE  CHEST 1 VIEW COMPARISON:  06/07/2018 FINDINGS: Previous median sternotomy with CABG procedure. Heart size appears normal. There are postsurgical changes and volume loss are from previous right lower lobectomy. No superimposed airspace consolidation. No pleural effusion or edema. IMPRESSION: 1. No acute cardiopulmonary abnormalities. Electronically Signed   By: Signa Kell M.D.   On: 06/17/2018 09:36   Dg Chest Port 1 View  Result Date: 06/07/2018 CLINICAL DATA:  Respiratory distress EXAM: PORTABLE CHEST 1 VIEW COMPARISON:  05/19/2018 FINDINGS: Chronic elevation of the right diaphragm with costophrenic sulcus blunting on the right-consistent with scarring. Cardiomegaly after CABG. There is no edema, consolidation, effusion, or pneumothorax. IMPRESSION: Stable from prior.  No evidence of acute disease. Electronically Signed   By: Marnee Spring M.D.   On: 06/07/2018 07:36    (Echo, Carotid, EGD, Colonoscopy, ERCP)    Subjective:   Discharge Exam: Vitals:   06/21/18 0820 06/21/18 1244  BP:  138/76  Pulse:  (!) 57  Resp:  14  Temp:  97.8 F (36.6 C)  SpO2: 91% 98%   Vitals:   06/20/18 2215 06/21/18 0436 06/21/18 0820 06/21/18 1244  BP: (!) 119/57 131/62  138/76  Pulse: (!) 43 (!) 43  (!) 57  Resp: 20 18  14   Temp: 97.8 F (36.6 C) 97.7 F (36.5 C)  97.8 F (36.6 C)  TempSrc: Oral Oral  Oral  SpO2: 99% 97% 91% 98%  Weight:      Height:        General: Pt is alert, awake, not in acute distress Cardiovascular: RRR, S1/S2 +, no rubs, no gallops Respiratory: Scattered wheezing bilaterally, no wheezing, no rhonchi Abdominal: Soft, NT, ND, bowel sounds + Extremities: Trace bilateral pitting edema    The results of significant diagnostics from this hospitalization (including imaging, microbiology, ancillary and laboratory)  are listed below for reference.     Microbiology: No results found for this or any previous visit (from the past 240 hour(s)).   Labs: BNP (last 3  results) Recent Labs    06/07/18 0643 06/07/18 1939 06/17/18 0907  BNP 133.1* 187.3* 167.6*   Basic Metabolic Panel: Recent Labs  Lab 06/17/18 0903 06/18/18 0421  NA 142 137  K 4.0 3.9  CL 97* 96*  CO2 39* 32  GLUCOSE 93 99  BUN 34* 37*  CREATININE 1.10 1.22  CALCIUM 8.7* 8.3*   Liver Function Tests: No results for input(s): AST, ALT, ALKPHOS, BILITOT, PROT, ALBUMIN in the last 168 hours. No results for input(s): LIPASE, AMYLASE in the last 168 hours. No results for input(s): AMMONIA in the last 168 hours. CBC: Recent Labs  Lab 06/17/18 0903 06/18/18 0421  WBC 9.4 15.1*  NEUTROABS  --  12.7*  HGB 12.7* 11.6*  HCT 42.3 38.7*  MCV 95.1 93.5  PLT 214 235   Cardiac Enzymes: Recent Labs  Lab 06/17/18 0907 06/17/18 1712 06/17/18 2247  TROPONINI 0.03* <0.03 0.03*   BNP: Invalid input(s): POCBNP CBG: Recent Labs  Lab 06/20/18 0737 06/20/18 1148 06/20/18 1653 06/20/18 2213 06/21/18 0802  GLUCAP 85 93 128* 137* 94   D-Dimer No results for input(s): DDIMER in the last 72 hours. Hgb A1c No results for input(s): HGBA1C in the last 72 hours. Lipid Profile No results for input(s): CHOL, HDL, LDLCALC, TRIG, CHOLHDL, LDLDIRECT in the last 72 hours. Thyroid function studies No results for input(s): TSH, T4TOTAL, T3FREE, THYROIDAB in the last 72 hours.  Invalid input(s): FREET3 Anemia work up No results for input(s): VITAMINB12, FOLATE, FERRITIN, TIBC, IRON, RETICCTPCT in the last 72 hours. Urinalysis    Component Value Date/Time   COLORURINE YELLOW 06/07/2018 1256   APPEARANCEUR CLEAR 06/07/2018 1256   LABSPEC >1.046 (H) 06/07/2018 1256   PHURINE 7.0 06/07/2018 1256   GLUCOSEU 50 (A) 06/07/2018 1256   HGBUR NEGATIVE 06/07/2018 1256   BILIRUBINUR NEGATIVE 06/07/2018 1256   KETONESUR NEGATIVE 06/07/2018 1256   PROTEINUR NEGATIVE 06/07/2018 1256   UROBILINOGEN 0.2 10/19/2010 1150   NITRITE NEGATIVE 06/07/2018 1256   LEUKOCYTESUR NEGATIVE 06/07/2018 1256    Sepsis Labs Invalid input(s): PROCALCITONIN,  WBC,  LACTICIDVEN Microbiology No results found for this or any previous visit (from the past 240 hour(s)).   Time coordinating discharge: 34 minutes  SIGNED:   Alwyn RenElizabeth G Mathews, MD  [facility] 06/21/2018, 12:52 PM Pager   If 7PM-7AM, please contact night-coverage [email or web address] Password [email or web address]

## 2018-06-21 NOTE — Care Management Important Message (Signed)
Important Message  Patient Details  Name: Jahmad Mercedes MRN: [id number] Date of Birth: 1933-11-24   Medicare Important Message Given:  Yes    Caren Macadam 06/21/2018, 10:37 AMImportant Message  Patient Details  Name: Shanga Shagena MRN: [id number] Date of Birth: May 13, 1934   Medicare Important Message Given:  Yes    Caren Macadam 06/21/2018, 10:37 AM

## 2018-06-21 NOTE — Progress Notes (Signed)
Daily Progress Note   Patient Name: Roberto Reilly       Date: 06/21/2018 DOB: [birth year]  Age: 85 y.o. MRN#: [id number] Attending Physician: Alwyn Ren, MD Primary Care Physician: Pearson Grippe, MD Admit Date: 06/17/2018  Reason for Consultation/Follow-up: Establishing goals of care  Subjective: I saw Roberto Reilly this AM.  Denies complaints and reports doing "OK."  Reviewed plan to transition to residential hospice today.    Length of Stay: 3  Current Medications: Scheduled Meds:  . acetaZOLAMIDE  250 mg Oral BID  . amLODipine  10 mg Oral Daily  . aspirin EC  81 mg Oral Daily  . atorvastatin  40 mg Oral q1800  . budesonide (PULMICORT) nebulizer solution  0.5 mg Nebulization BID  . cholecalciferol  1,000 Units Oral Daily  . feeding supplement (ENSURE ENLIVE)  237 mL Oral BID BM  . furosemide  40 mg Oral Daily  . guaiFENesin  600 mg Oral BID  . insulin aspart  0-15 Units Subcutaneous TID WC  . ipratropium-albuterol  3 mL Nebulization QID  . metoprolol succinate  12.5 mg Oral Daily  . morphine  5 mg Oral Q6H  . potassium chloride SA  20 mEq Oral Daily  . predniSONE  40 mg Oral Q breakfast  . warfarin  2.5 mg Oral ONCE-1800  . Warfarin - Pharmacist Dosing Inpatient   Does not apply q1800    Continuous Infusions: . sodium chloride 1,000 mL (06/17/18 1051)    PRN Meds: sodium chloride, acetaminophen **OR** acetaminophen, albuterol, LORazepam, morphine, ondansetron **OR** ondansetron (ZOFRAN) IV  Physical Exam     General: Alert, awake, mild respiratory distress with increased work of breathing and congestion noted.  Lungs: Decreased air movement Abdomen: nondistended Ext: No significant edema Skin: Warm and dry Neuro: Grossly intact, nonfocal.      Vital Signs: BP  131/62 (BP Location: Right Arm)   Pulse (!) 43   Temp 97.7 F (36.5 C) (Oral)   Resp 18   Ht 6' (1.829 m)   Wt 88.9 kg   SpO2 91%   BMI 26.58 kg/m  SpO2: SpO2: 91 % O2 Device: O2 Device: Nasal Cannula O2 Flow Rate: O2 Flow Rate (L/min): 4 L/min  Intake/output summary:   Intake/Output Summary (Last 24 hours) at 06/21/2018 0911 Last data filed at 06/21/2018 0900  Gross per 24 hour  Intake 600 ml  Output 1400 ml  Net -800 ml   LBM: Last BM Date: 06/20/18 Baseline Weight: Weight: (pt refused to stand for weight and is in chair at this time) Most recent weight: Weight: 88.9 kg       Palliative Assessment/Data:    Flowsheet Rows     Most Recent Value  Intake Tab  Referral Department  Hospitalist  Unit at Time of Referral  Med/Surg Unit  Palliative Care Primary Diagnosis  Neurology  Date Notified  06/18/18  Palliative Care Type  Return patient Palliative Care  Reason for referral  Clarify Goals of Care  Date of Admission  06/17/18  # of days IP prior to Palliative referral  1  Clinical Assessment  Psychosocial & Spiritual Assessment  Palliative Care Outcomes      Patient Active Problem List   Diagnosis Date Noted  . Acute exacerbation of chronic obstructive pulmonary disease (COPD) (HCC) 06/07/2018  . Community acquired pneumonia 05/16/2018  . Counseling regarding end of life decision making 09/22/2017  . Acute respiratory failure with hypoxia (HCC) 02/21/2017  . Acute on chronic congestive heart failure (HCC) 02/21/2017  . Acute respiratory failure (HCC) 02/21/2017  . Chronic diastolic CHF (congestive heart failure), NYHA class 2 (HCC) 01/17/2017  . Acute bronchitis due to respiratory syncytial virus (RSV) 05/20/2016  . Respiratory failure (HCC) 05/19/2016  . Shortness of breath 05/18/2016  . Chronic renal failure syndrome, stage 3 (moderate) (HCC) 05/18/2016  . Hypercapnia 05/18/2016  . Hyperglycemia   . Chronic osteomyelitis of right foot (HCC) 02/05/2016  .  Acute and chronic respiratory failure with hypoxia (HCC)   . Abnormal TSH 11/03/2015  . Pleural effusion 11/01/2015  . Acute renal failure (ARF) (HCC) 11/01/2015  . Sepsis (HCC) 11/01/2015  . Pneumonia 11/01/2015  . COPD exacerbation (HCC) 11/01/2015  . Encounter for long-term (current) use of other medications 01/07/2014  . Nasal sinus congestion 01/07/2014  . Diverticulosis of colon with hemorrhage 05/24/2013  . Internal hemorrhoids 05/24/2013  . AAA (abdominal aortic aneurysm) without rupture seen on CT scan 05/22/2013  . Rectal bleeding 05/21/2013  . Anemia 05/20/2013  . Generalized anxiety disorder 05/09/2013  . Auditory hallucination 05/09/2013  . COPD mixed type (HCC) 05/07/2013  . COPD with acute exacerbation (HCC) 05/07/2013  . Chest discomfort 05/07/2013  . Hx of scabies 04/29/2013  . Essential hypertension 04/29/2013  . Obesity (BMI 30-39.9) 01/18/2013  . Paroxysmal atrial fibrillation (HCC); CHA2DS2-VASc Score = 4. On Warfarin   . Dyslipidemia, goal LDL below 70   . Reactive depression (situational) 09/06/2011  . Coronary atherosclerosis 05/23/2007  . CAD in native artery - LM, RCA, RI & Cx disease --> CABG x 4 (LIMA-LAD, SVG-OM/RI, SVG-rPDA-rPL) 07/21/2005    Class: History of  . S/P CABG x 4 07/21/2005    Palliative Care Assessment & Plan   Patient Profile: 85 y.o. male  with past medical history of COPD on 4 L at baseline, CAD status post CABG, AAA, anxiety, depression, A. fib on warfarin and recent hospitalization earlier this month admitted on 06/17/2018 with COPD exacerbation.  Following admission, patient reported that he was "ready" and no longer wanting to pursue medical interventions for his end-stage COPD.  Palliative consulted for goals of care  Recommendations/Plan:  Continue current care while awaiting transfer to residential hospice.  Plan to d/c oxygen once he has transferred.  Goals of Care and Additional Recommendations:  Limitations on Scope of  Treatment: Full Comfort Care  on discharge  Code Status:    Code Status Orders  (From admission, onward)         Start     Ordered   06/17/18 1400  Do not attempt resuscitation (DNR)  Continuous    Question Answer Comment  In the event of cardiac or respiratory ARREST Do not call a "code blue"   In the event of cardiac or respiratory ARREST Do not perform Intubation, CPR, defibrillation or ACLS   In the event of cardiac or respiratory ARREST Use medication by any route, position, wound care, and other measures to relive pain and suffering. May use oxygen, suction and manual treatment of airway obstruction as needed for comfort.      06/17/18 1359        Code Status History    Date Active Date Inactive Code Status Order ID Comments User Context   06/17/2018 1356 06/17/2018 1359 DNR [id number]  Bobette Mo, MD ED   06/07/2018 1918 06/10/2018 1927 Full Code [id number]  Joycelyn Das, MD Inpatient   06/07/2018 1333 06/07/2018 1918 DNR [id number]  Joycelyn Das, MD ED   05/16/2018 0351 05/19/2018 2131 DNR [id number]  Eduard Clos, MD Inpatient   07/05/2017 1617 07/06/2017 2333 DNR [id number]  Delaine Lame, MD Inpatient   02/21/2017 2322 02/25/2017 2012 DNR [id number]  Eduard Clos, MD Inpatient   01/17/2017 1852 01/21/2017 1826 DNR [id number]  Narda Bonds, MD Inpatient   01/17/2017 1606 01/17/2017 1852 DNR [id number]  Marily Memos, MD ED   09/26/2016 1425 09/27/2016 2320 DNR [id number]  Leatha Gilding, MD Inpatient   05/18/2016 1937 05/22/2016 1902 DNR [id number]  Vassie Loll, MD ED   04/26/2016 1826 04/30/2016 1909 DNR [id number]  Hollice Espy, MD Inpatient   11/01/2015 2251 11/05/2015 2047 DNR [id number]  Therisa Doyne, MD Inpatient   05/20/2013 2052 05/25/2013 2148 DNR [id number]  Gery Pray, MD ED   05/07/2013 1721 05/09/2013 2231 Full Code [id number]  Penny Pia, MD Inpatient   04/29/2013 1636 05/01/2013 1728 Full Code [id number]  Drema Dallas, MD Inpatient      Advance Directive Documentation     Most Recent Value  Type of Advance Directive  Healthcare Power of Attorney, Living will  Pre-existing out of facility DNR order (yellow form or pink MOST form)  -  "MOST" Form in Place?  -       Prognosis:   Hours - Days most likely once he transitions to full comfort and discontinues use of his oxygen and other medications.  He will certainly need aggressive symptom management and will be best served by placement at residential hospice for EOL care.  Discharge Planning:  Hospice facility  Care plan was discussed with patient, daughter, [facility] liaison  Thank you for allowing the Palliative Medicine Team to assist in the care of this patient.   Total Time 15 Prolonged Time Billed No      Greater than 50%  of this time was spent counseling and coordinating care related to the above assessment and plan.  Romie Minus, MD  Please contact Palliative Medicine Team phone at [phone] for questions and concerns.

## 2018-06-21 NOTE — Progress Notes (Signed)
[facility]  Patient aware and agreeable.  IV removed - WNL.  [facility]  Awaiting transport.

## 2018-06-21 NOTE — Discharge Instructions (Signed)
Hospice °Hospice is a service that is designed to provide people who are terminally ill and their families with medical, spiritual, and psychological support. Its aim is to improve your quality of life by keeping you as comfortable as possible in the final stages of life. °Who will be my providers when I begin hospice care? °Hospice teams often include: °· A nurse. °· A doctor. The hospice doctor will be available for your care, but you can include your regular doctor or nurse practitioner. °· A social worker. °· A counselor. °· A religious leader (such as a chaplain). °· A dietitian. °· Therapists. °· Trained volunteers who can help with care. °What services does hospice provide? °Hospice services can vary depending on the center or organization. Generally, they include: °· Ways to keep you comfortable, such as: °? Providing care in your home or in a home-like setting. °? Working with your family and friends to help meet your needs. °? Allowing you to enjoy the support of loved ones by receiving much of your basic care from family and friends. °· Pain relief and symptom management. The staff will supply all necessary medicines and equipment so that you can stay comfortable and alert enough to enjoy the company of your friends and family. °· Visits or care from a nurse and doctor. This may include 24-hour on-call services. °· Companionship when you are alone. °· Allowing you and your family to rest. Hospice staff may do light housekeeping, prepare meals, and run errands. °· Counseling. They will make sure your emotional, spiritual, and social needs are being met, as well as those needs of your family members. °· Spiritual care. This will be individualized to meet your needs and your family's needs. It may involve: °? Helping you and your family understand the dying process. °? Helping you say goodbye to your family and friends. °? Performing a specific religious ceremony or ritual. °· Massage. °· Nutrition  therapy. °· Physical and occupational therapy. °· Short-term inpatient care, if something cannot be managed in the home. °· Art or music therapy. °· Bereavement support for grieving family members. °When should hospice care begin? °Most people who use hospice are believed to have less than 6 months to live. °· Your family and health care providers can help you decide when hospice services should begin. °· If you live longer than 6 months but your condition does not improve, your doctor may be able to approve you for continued hospice care. °· If your condition improves, you may discontinue the program. °What should I consider before selecting a program? °Most hospice programs are run by nonprofit, independent organizations. Some are affiliated with hospitals, nursing homes, or home health care agencies. Hospice programs can take place in your home or at a hospice center, hospital, or skilled nursing facility. When choosing a hospice program, ask the following questions: °· What services are available to me? °· What services will be offered to my loved ones? °· How involved will my loved ones be? °· How involved will my health care provider be? °· Who makes up the hospice care team? How are they trained or screened? °· How will my pain and symptoms be managed? °· If my circumstances change, can the services be provided in a different setting, such as my home or in the hospital? °· Is the program reviewed and licensed by the state or certified in some other way? °· What does it cost? Is it covered by insurance? °· If I choose a hospice   center or nursing home, where is the hospice center located? Is it convenient for family and friends? °· If I choose a hospice center or nursing home, can my family and friends visit any time? °· Will you provide emotional and spiritual support? °· Who can my family call with questions? °Where can I learn more about hospice? °You can learn about existing hospice programs in your area  from your health care providers. You can also read more about hospice online. The websites of the following organizations have helpful information: °· National Hospice and Palliative Care Organization (NHPCO): www.nhpco.org °· National Association for Home Care & Hospice (NAHC): www.nahc.org °· H[employer] of [employer] (HFA): www.hospicefoundation.org °· American Cancer Society (ACS): www.cancer.org °· Hospice Net: www.hospicenet.org °· [employer] Associations of [employer] (VNAA): www.vnaa.org °You may also find more information by contacting the following agencies: °· A local agency on aging. °· Your local [employer] chapter. °· Your state's department of health or social services. °Summary °· Hospice is a service that is designed to provide people who are terminally ill and their families with medical, spiritual, and psychological support. °· Hospice aims to improve your quality of life by keeping you as comfortable as possible in the final stages of life. °· Hospice teams often include a doctor, nurse, social worker, counselor, religious leader,dietitian, therapists, and volunteers. °· Hospice care generally includes medicine for symptom management, visits from doctors and nurses, physical and occupational therapy, nutrition counseling, spiritual and emotional counseling, caregiver support, and bereavement support for grieving family members. °· Hospice programs can take place in your home or at a hospice center, hospital, or skilled nursing facility. °This information is not intended to replace advice given to you by your health care provider. Make sure you discuss any questions you have with your health care provider. °Document Released: 08/26/2003 Document Revised: 05/31/2016 Document Reviewed: 05/31/2016 °Elsevier Interactive Patient Education © 2019 [employer] ° °

## 2018-06-21 NOTE — Progress Notes (Signed)
ANTICOAGULATION CONSULT NOTE  Pharmacy Consult for warfarin Indication:hx atrial fibrillation  No Known Allergies  Patient Measurements: Height: 6' (182.9 cm) Weight: 196 lb (88.9 kg) IBW/kg (Calculated) : 77.6  Vital Signs: Temp: 97.7 F (36.5 C) (01/30 0436) Temp Source: Oral (01/30 0436) BP: 131/62 (01/30 0436) Pulse Rate: 43 (01/30 0436)  Labs: Recent Labs    06/19/18 0448 06/20/18 0447 06/21/18 0417  LABPROT 22.6* 20.4* 21.5*  INR 2.02 1.78 1.89    Estimated Creatinine Clearance: 51.2 mL/min (by C-G formula based on SCr of 1.22 mg/dL).   Assessment: Patient is an 85 y.o with gx afib on warfarin PTA, presented to the ED on 06/17/18 with c/o SOB.  Warfarin resumed inpatient.   Baseline INR subtherapeutic  Prior anticoagulation: Warfarin 5 mg (2.5 mg bid) daily except 2.5 mg on TTSun (verified with pt's daughter). Note that [facility] clinic note on 04/23/18 said "[facility] therapy has been d/ced". Regimen at that time given as 2.5 mg daily except 5 mg (2.5 mg bid) on MWF  Significant events:  Today, 06/21/2018:  CBC: not done since 1/27: hgb low, plt wnl at that time  INR still SUBtherapeutic but rising appropriately  Major drug interactions: abx can increase warfarin sensitivity  No bleeding issues per nursing  Eating only about 1x/day  Goal of Therapy: INR 2-3  Plan:  Warfarin 2.5 mg PO tonight at 18:00  Daily INR  CBC tomorrow  Monitor for signs of bleeding or thrombosis   Bernadene Person, PharmD, BCPS [phone] 06/21/2018, 8:10 AM

## 2018-07-22 DEATH — deceased

## 2018-09-21 ENCOUNTER — Telehealth: Payer: Self-pay | Admitting: Cardiology

## 2018-09-21 NOTE — Telephone Encounter (Signed)
Called patient, but, recording said number is not a working number.

## 2018-09-24 ENCOUNTER — Ambulatory Visit: Payer: Self-pay | Admitting: Cardiology

## 2018-09-26 ENCOUNTER — Telehealth: Payer: Self-pay | Admitting: Cardiology

## 2018-09-26 NOTE — Telephone Encounter (Signed)
Called patient, but, recording stated number is not a working number.

## 2018-12-20 ENCOUNTER — Other Ambulatory Visit: Payer: Self-pay

## 2019-07-30 ENCOUNTER — Encounter: Payer: Self-pay | Admitting: General Practice

## 2020-08-23 IMAGING — CR DG CHEST 2V
3 series · 3 of 3 positions shown · non-contrast
Comparison: Chest radiograph performed 04/13/2018

CLINICAL DATA: Acute onset of shortness of breath and productive
cough.

EXAM:
CHEST - 2 VIEW

[w chest lat]
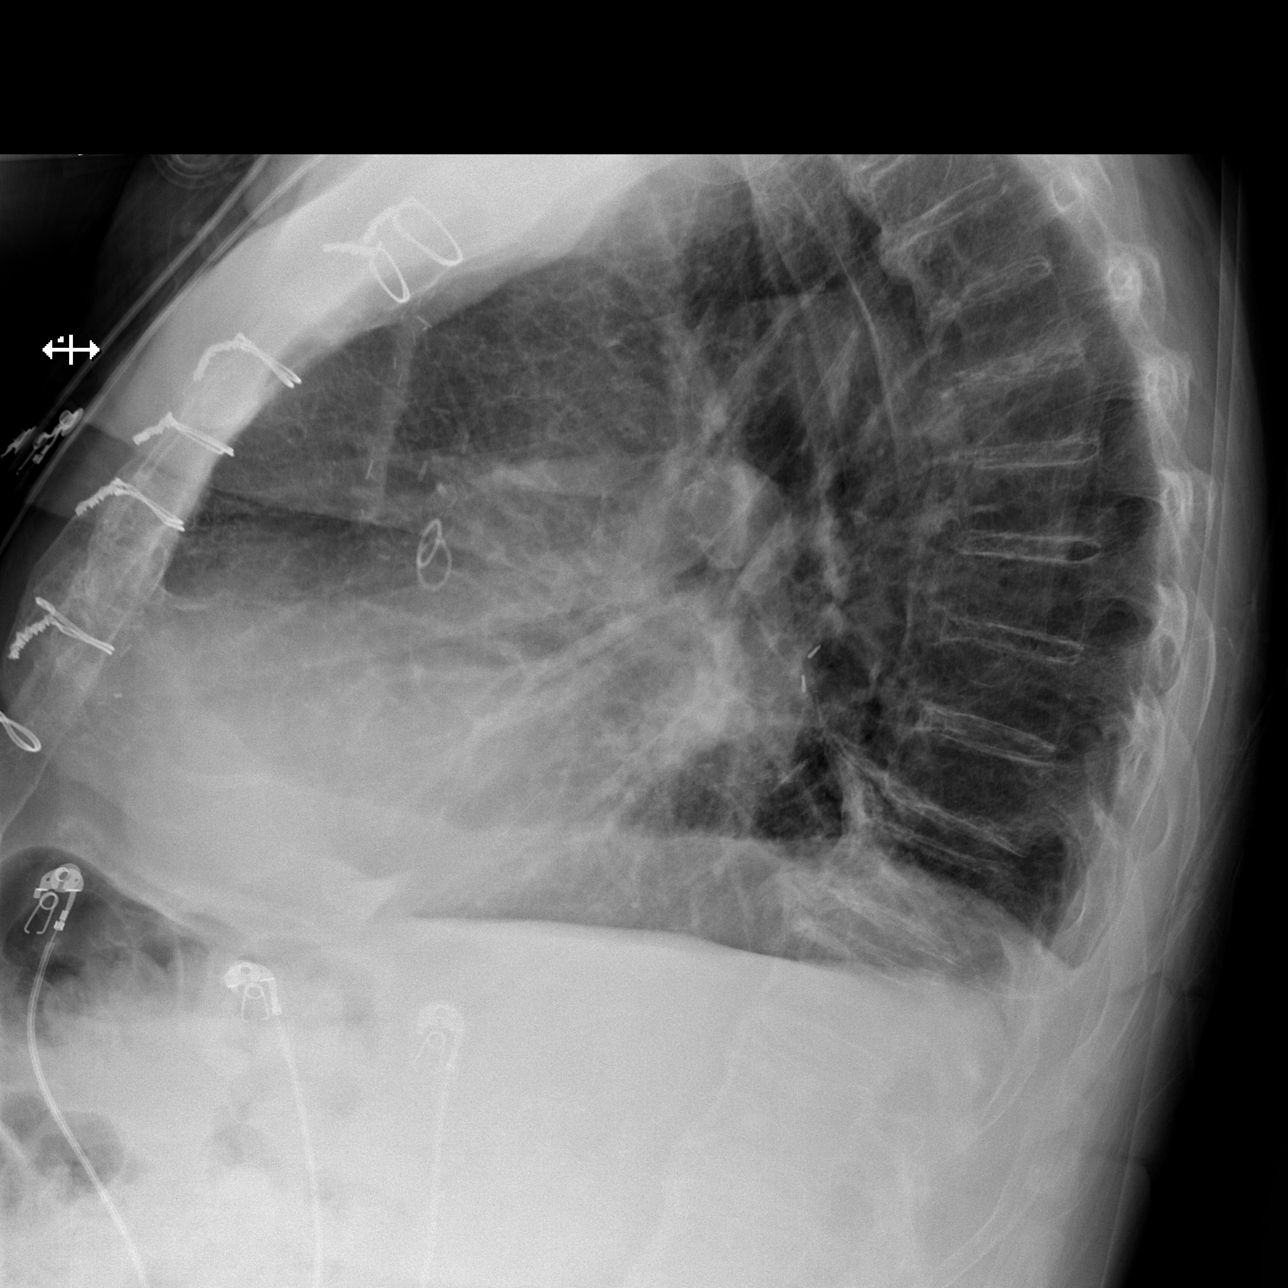

[x chest ap (1 of 2)]
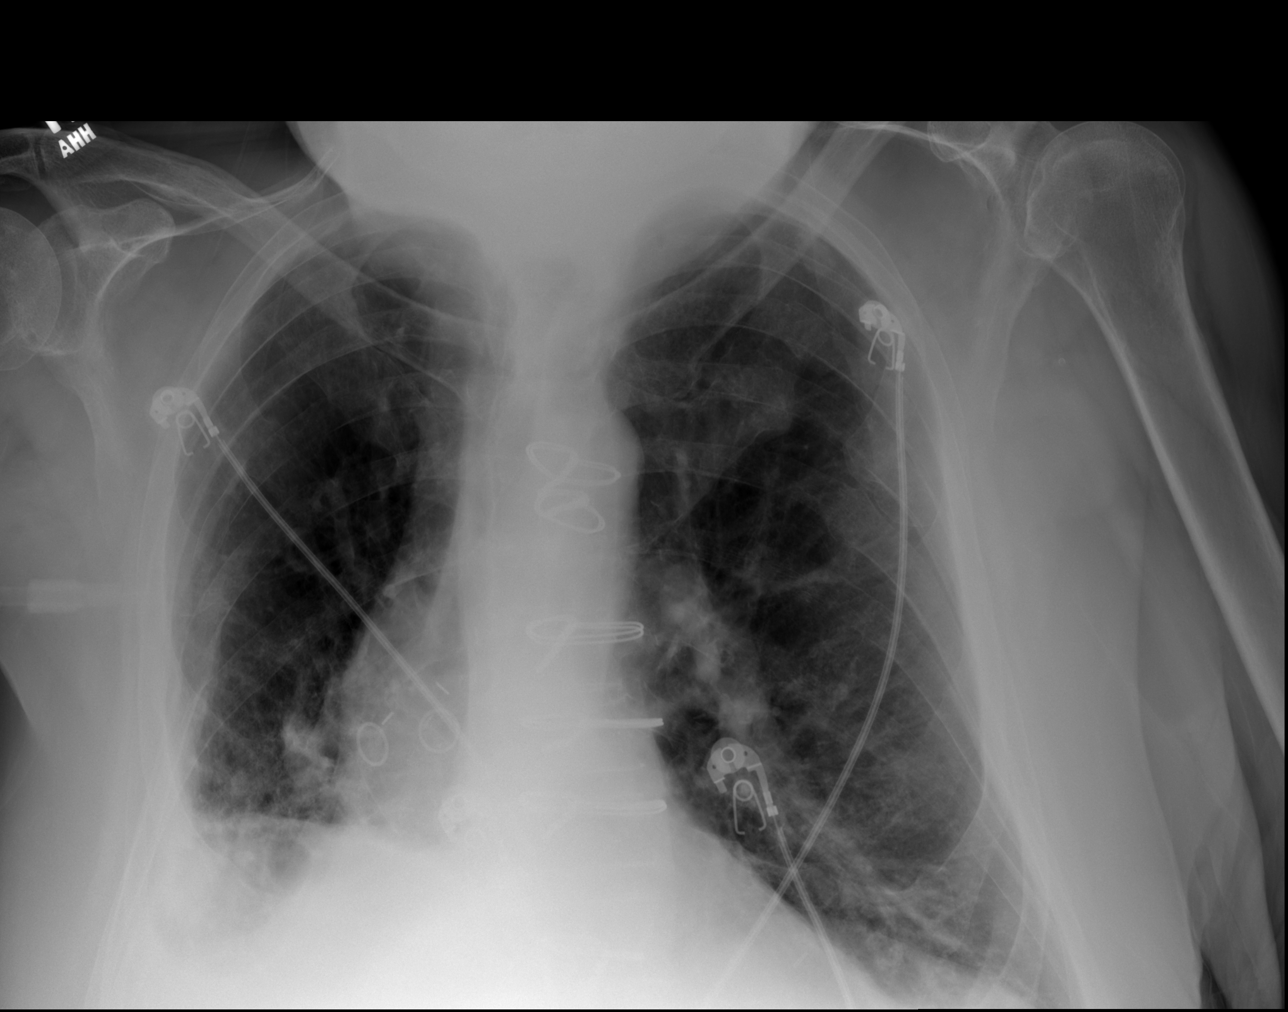

[x chest ap (2 of 2)]
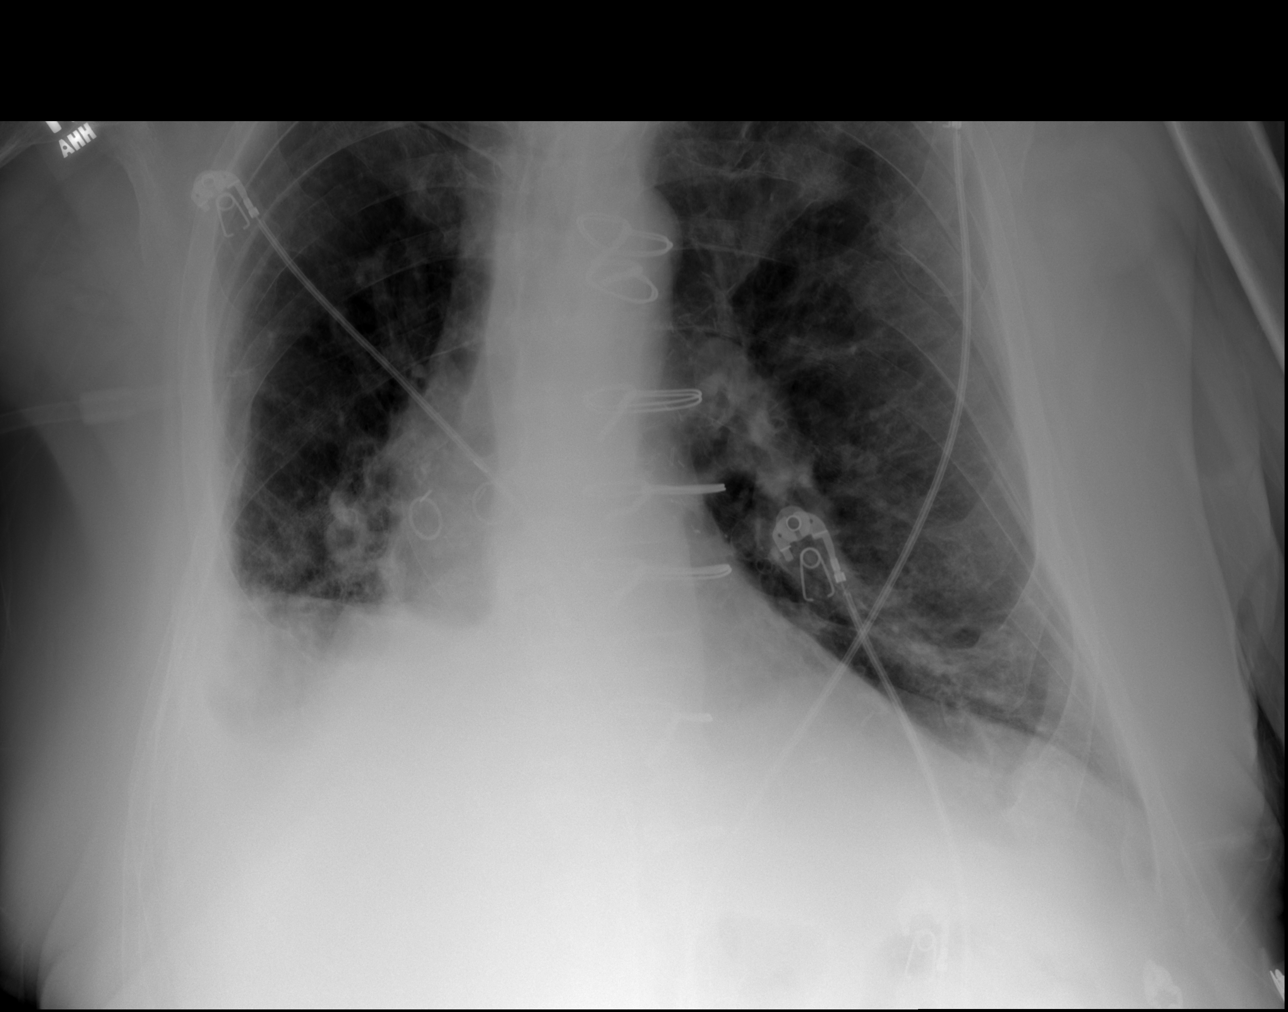

[3 of 3 positions shown; findings below may reference images not displayed]

FINDINGS: The patient is status post right lower lobe lung resection. A small
right pleural effusion is noted. Right basilar airspace opacity may
reflect atelectasis or possibly mild pneumonia, given the patient's
symptoms. Underlying vascular congestion is noted. No pneumothorax
is seen.

The cardiomediastinal silhouette is borderline normal. The patient
is status post median sternotomy, with evidence of prior CABG. No
acute osseous abnormalities are identified.
IMPRESSION: 1. Small right pleural effusion noted. Right basilar airspace
opacity may reflect atelectasis or possibly mild pneumonia, given
the patient's symptoms.
2. Underlying vascular congestion noted.

## 2020-08-27 IMAGING — DX DG CHEST 2V
3 series · 3 of 3 positions shown · non-contrast
Comparison: 05/15/2018 and earlier.

CLINICAL DATA: 84-year-old acute onset of shortness of breath.
Current history of COPD. Prior CABG. Prior RIGHT UPPER lobectomy for
hamartoma.

EXAM:
CHEST - 2 VIEW

[chest lat]
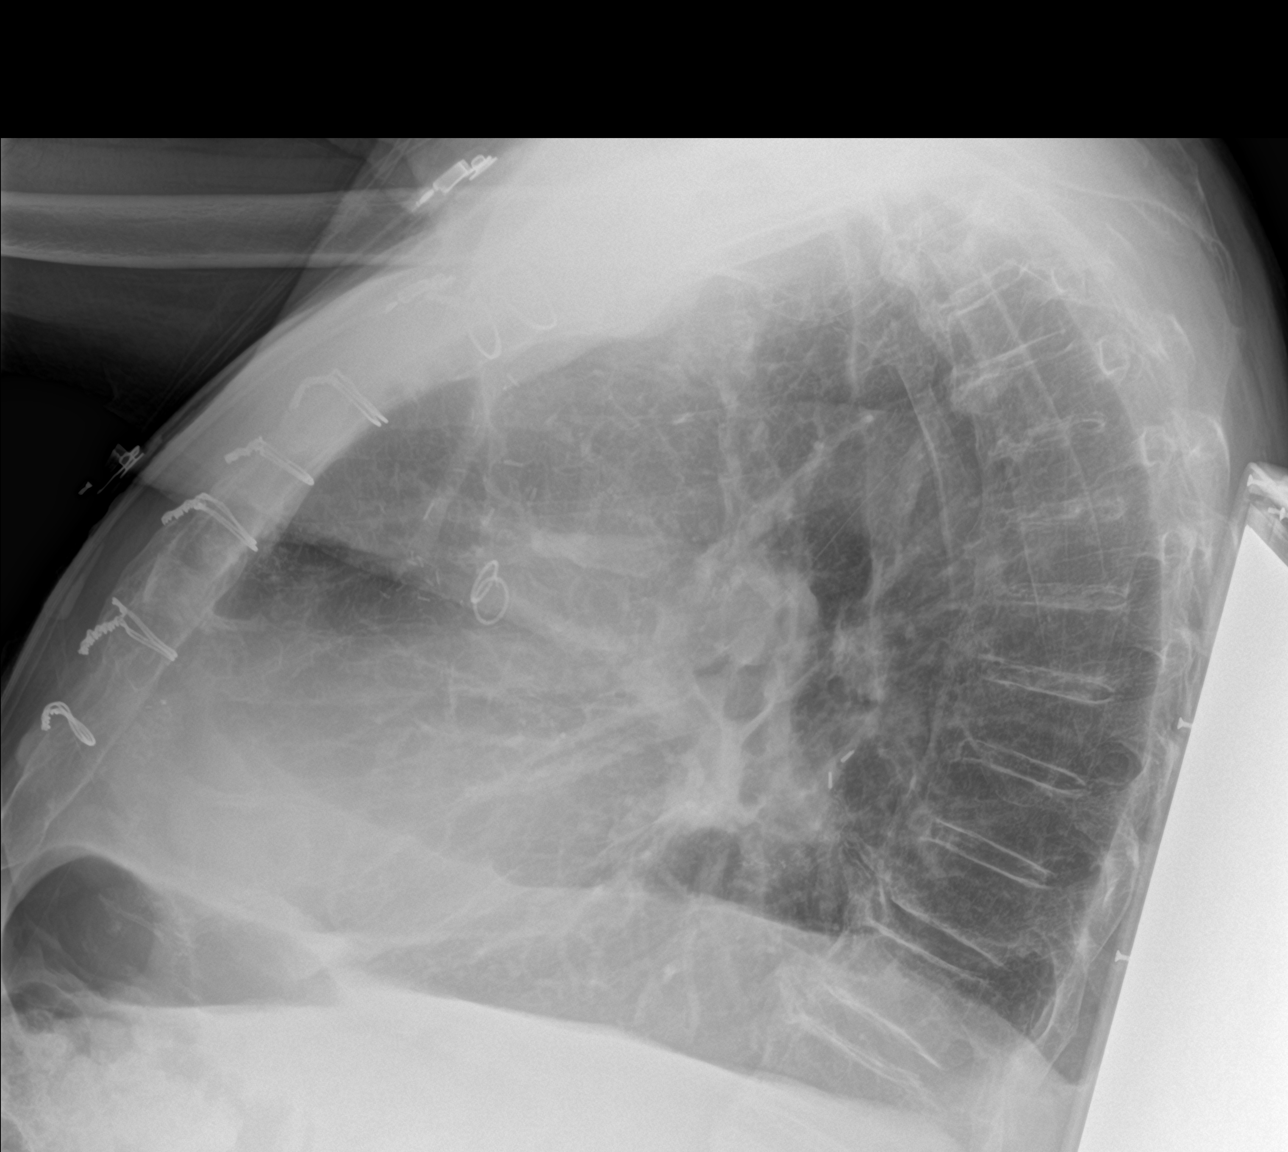

[chest ap (1 of 2)]
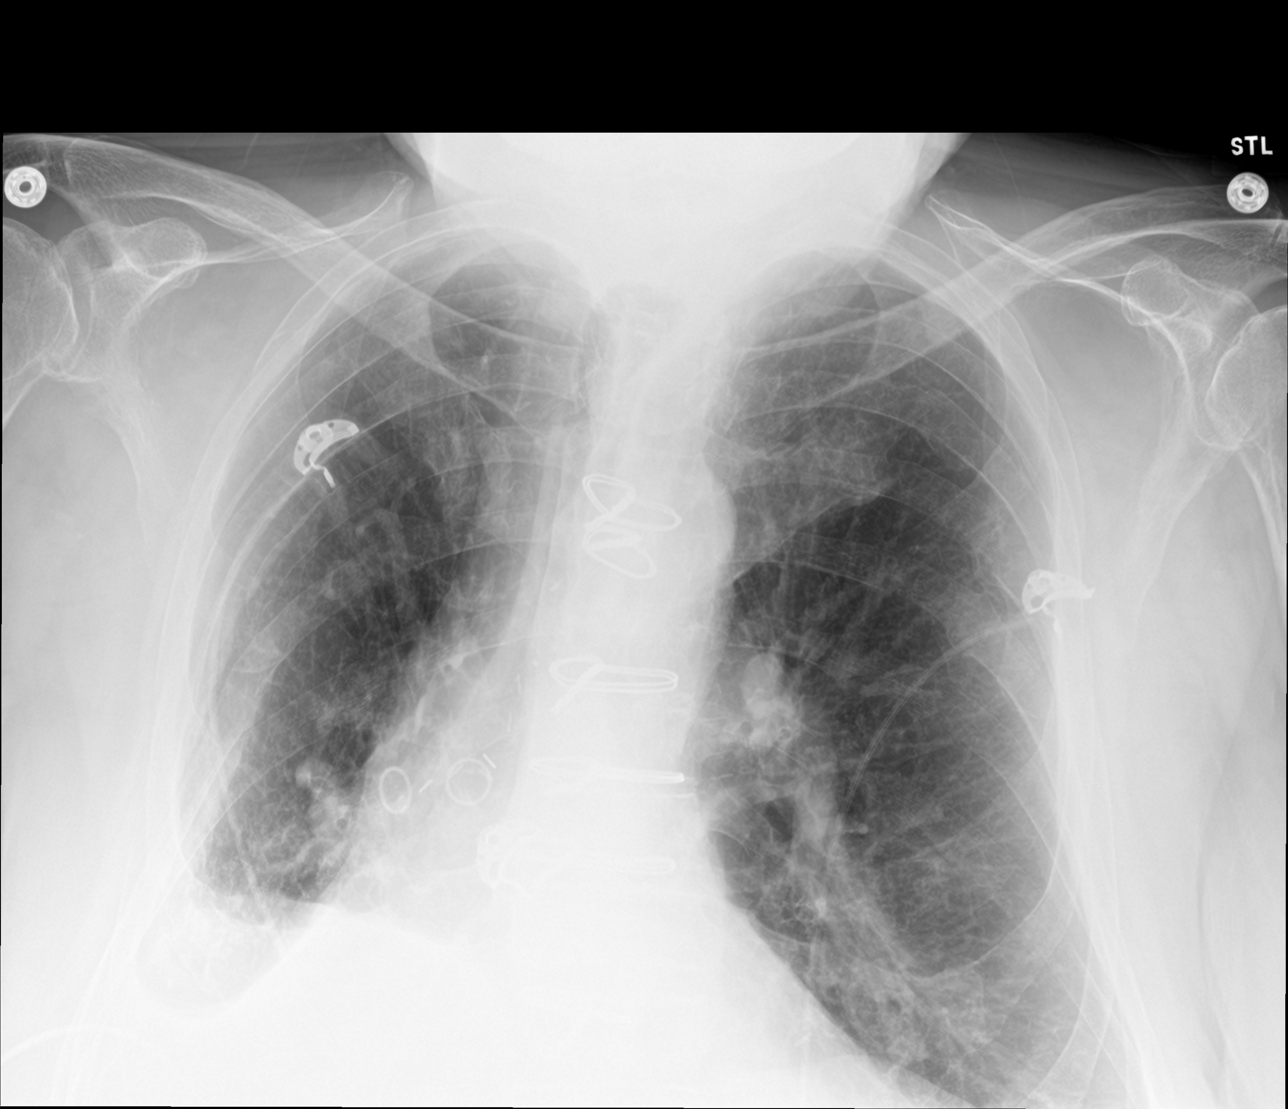

[chest ap (2 of 2)]
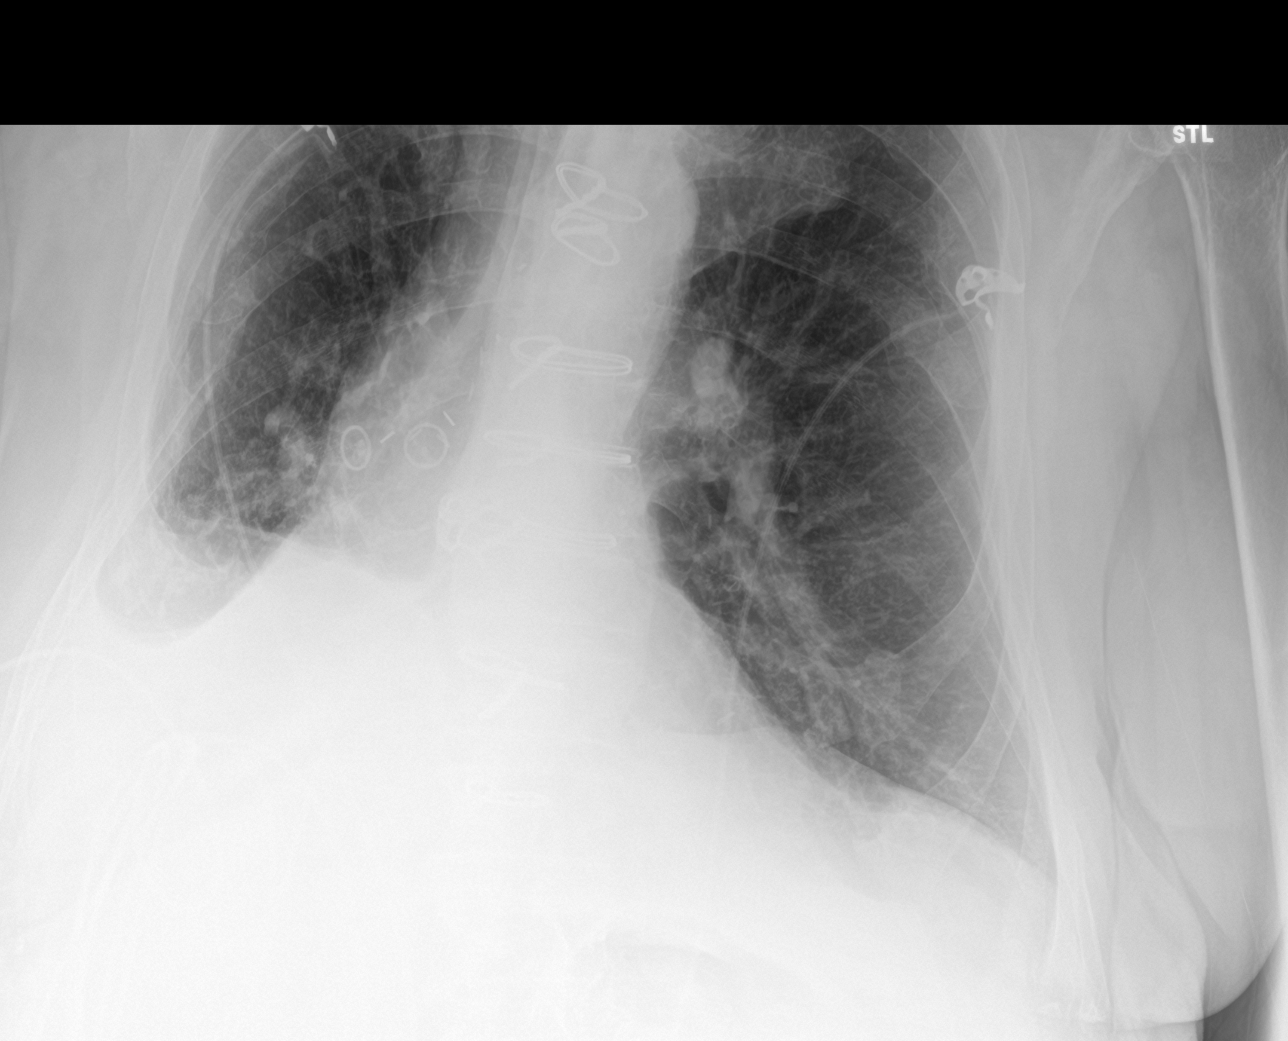

[3 of 3 positions shown; findings below may reference images not displayed]

FINDINGS: Sternotomy for CABG. Cardiac silhouette mildly enlarged, unchanged.
Thoracic aorta tortuous and mildly atherosclerotic, unchanged.
Postsurgical pleuroparenchymal scarring at the RIGHT base related to
the prior RIGHT UPPER lobectomy, unchanged over multiple prior
examinations. No new pulmonary parenchymal abnormalities. No pleural
effusions currently. Degenerative changes and DISH involving the
thoracic spine.
IMPRESSION: No acute cardiopulmonary disease. Stable post surgical
pleuroparenchymal scarring at the RIGHT base.
